# Patient Record
Sex: Female | Born: 1935 | Race: White | Hispanic: No | Smoking: Never smoker
Health system: Southern US, Community
[De-identification: ages and names within clinical notes are randomized; demographics above are authoritative.]

## PROBLEM LIST (undated history)

## (undated) DIAGNOSIS — M4800 Spinal stenosis, site unspecified: Secondary | ICD-10-CM

## (undated) DIAGNOSIS — F0390 Unspecified dementia without behavioral disturbance: Secondary | ICD-10-CM

## (undated) DIAGNOSIS — D509 Iron deficiency anemia, unspecified: Secondary | ICD-10-CM

## (undated) DIAGNOSIS — E785 Hyperlipidemia, unspecified: Secondary | ICD-10-CM

## (undated) DIAGNOSIS — K219 Gastro-esophageal reflux disease without esophagitis: Secondary | ICD-10-CM

## (undated) DIAGNOSIS — Z8619 Personal history of other infectious and parasitic diseases: Secondary | ICD-10-CM

## (undated) DIAGNOSIS — J301 Allergic rhinitis due to pollen: Secondary | ICD-10-CM

## (undated) DIAGNOSIS — K449 Diaphragmatic hernia without obstruction or gangrene: Secondary | ICD-10-CM

## (undated) DIAGNOSIS — I499 Cardiac arrhythmia, unspecified: Secondary | ICD-10-CM

## (undated) DIAGNOSIS — M1990 Unspecified osteoarthritis, unspecified site: Secondary | ICD-10-CM

## (undated) DIAGNOSIS — I10 Essential (primary) hypertension: Secondary | ICD-10-CM

## (undated) DIAGNOSIS — R5383 Other fatigue: Secondary | ICD-10-CM

## (undated) DIAGNOSIS — M109 Gout, unspecified: Secondary | ICD-10-CM

## (undated) DIAGNOSIS — J3081 Allergic rhinitis due to animal (cat) (dog) hair and dander: Secondary | ICD-10-CM

## (undated) DIAGNOSIS — C801 Malignant (primary) neoplasm, unspecified: Secondary | ICD-10-CM

## (undated) DIAGNOSIS — N189 Chronic kidney disease, unspecified: Secondary | ICD-10-CM

## (undated) DIAGNOSIS — I509 Heart failure, unspecified: Secondary | ICD-10-CM

## (undated) DIAGNOSIS — I4891 Unspecified atrial fibrillation: Secondary | ICD-10-CM

## (undated) HISTORY — DX: Heart failure, unspecified: I50.9

## (undated) HISTORY — DX: Unspecified osteoarthritis, unspecified site: M19.90

## (undated) HISTORY — DX: Spinal stenosis, site unspecified: M48.00

## (undated) HISTORY — PX: TONSILLECTOMY: SUR1361

## (undated) HISTORY — PX: NOSE SURGERY: SHX723

## (undated) HISTORY — PX: HERNIA REPAIR: SHX51

## (undated) HISTORY — DX: Personal history of other infectious and parasitic diseases: Z86.19

## (undated) HISTORY — PX: CATARACT EXTRACTION: SUR2

## (undated) HISTORY — PX: OTHER SURGICAL HISTORY: SHX169

## (undated) HISTORY — DX: Malignant (primary) neoplasm, unspecified: C80.1

## (undated) HISTORY — DX: Diaphragmatic hernia without obstruction or gangrene: K44.9

## (undated) HISTORY — DX: Iron deficiency anemia, unspecified: D50.9

## (undated) HISTORY — DX: Allergic rhinitis due to pollen: J30.1

## (undated) HISTORY — DX: Gastro-esophageal reflux disease without esophagitis: K21.9

## (undated) HISTORY — DX: Allergic rhinitis due to animal (cat) (dog) hair and dander: J30.81

## (undated) HISTORY — PX: ROTATOR CUFF REPAIR: SHX139

## (undated) HISTORY — DX: Cardiac arrhythmia, unspecified: I49.9

## (undated) HISTORY — PX: TOTAL ABDOMINAL HYSTERECTOMY: SHX209

## (undated) HISTORY — DX: Gout, unspecified: M10.9

## (undated) HISTORY — DX: Hyperlipidemia, unspecified: E78.5

## (undated) HISTORY — PX: BACK SURGERY: SHX140

## (undated) HISTORY — PX: CHOLECYSTECTOMY: SHX55

## (undated) HISTORY — PX: SHOULDER SURGERY: SHX246

## (undated) HISTORY — DX: Essential (primary) hypertension: I10

## (undated) HISTORY — DX: Chronic kidney disease, unspecified: N18.9

## (undated) HISTORY — PX: MELANOMA EXCISION: SHX5266

## (undated) HISTORY — DX: Other fatigue: R53.83

## (undated) HISTORY — PX: APPENDECTOMY: SHX54

---

## 2003-04-05 ENCOUNTER — Other Ambulatory Visit: Payer: Self-pay

## 2004-07-05 ENCOUNTER — Inpatient Hospital Stay: Payer: Self-pay | Admitting: Endocrinology

## 2004-07-07 ENCOUNTER — Other Ambulatory Visit: Payer: Self-pay

## 2004-08-31 ENCOUNTER — Ambulatory Visit: Payer: Self-pay | Admitting: Unknown Physician Specialty

## 2004-09-29 ENCOUNTER — Ambulatory Visit: Payer: Self-pay | Admitting: Unknown Physician Specialty

## 2005-05-14 ENCOUNTER — Ambulatory Visit: Payer: Self-pay | Admitting: Ophthalmology

## 2005-05-21 ENCOUNTER — Ambulatory Visit: Payer: Self-pay | Admitting: Ophthalmology

## 2005-09-07 ENCOUNTER — Encounter: Payer: Self-pay | Admitting: Unknown Physician Specialty

## 2005-09-17 ENCOUNTER — Ambulatory Visit: Payer: Self-pay | Admitting: Unknown Physician Specialty

## 2005-09-26 ENCOUNTER — Encounter: Payer: Self-pay | Admitting: Unknown Physician Specialty

## 2005-11-26 ENCOUNTER — Ambulatory Visit: Payer: Self-pay | Admitting: Infectious Diseases

## 2006-05-09 ENCOUNTER — Ambulatory Visit: Payer: Self-pay | Admitting: Otolaryngology

## 2006-07-10 ENCOUNTER — Ambulatory Visit: Payer: Self-pay | Admitting: Unknown Physician Specialty

## 2006-07-10 ENCOUNTER — Other Ambulatory Visit: Payer: Self-pay

## 2006-07-23 ENCOUNTER — Inpatient Hospital Stay: Payer: Self-pay | Admitting: Unknown Physician Specialty

## 2006-09-10 ENCOUNTER — Encounter: Payer: Self-pay | Admitting: General Practice

## 2006-09-27 ENCOUNTER — Encounter: Payer: Self-pay | Admitting: General Practice

## 2006-10-28 ENCOUNTER — Encounter: Payer: Self-pay | Admitting: General Practice

## 2006-11-27 ENCOUNTER — Encounter: Payer: Self-pay | Admitting: General Practice

## 2007-02-05 ENCOUNTER — Ambulatory Visit: Payer: Self-pay | Admitting: Internal Medicine

## 2007-02-14 ENCOUNTER — Ambulatory Visit: Payer: Self-pay | Admitting: Internal Medicine

## 2007-02-14 ENCOUNTER — Other Ambulatory Visit: Payer: Self-pay

## 2007-04-08 ENCOUNTER — Ambulatory Visit: Payer: Self-pay | Admitting: Internal Medicine

## 2007-04-08 ENCOUNTER — Encounter: Payer: Self-pay | Admitting: Specialist

## 2007-04-27 ENCOUNTER — Encounter: Payer: Self-pay | Admitting: Specialist

## 2007-05-28 ENCOUNTER — Encounter: Payer: Self-pay | Admitting: Specialist

## 2007-06-27 ENCOUNTER — Encounter: Payer: Self-pay | Admitting: Specialist

## 2007-07-28 ENCOUNTER — Encounter: Payer: Self-pay | Admitting: Specialist

## 2007-08-27 ENCOUNTER — Encounter: Payer: Self-pay | Admitting: Specialist

## 2007-09-27 ENCOUNTER — Encounter: Payer: Self-pay | Admitting: Specialist

## 2007-10-28 ENCOUNTER — Encounter: Payer: Self-pay | Admitting: Specialist

## 2007-11-27 ENCOUNTER — Encounter: Payer: Self-pay | Admitting: Specialist

## 2007-12-28 ENCOUNTER — Encounter: Payer: Self-pay | Admitting: Specialist

## 2008-01-13 ENCOUNTER — Ambulatory Visit: Payer: Self-pay | Admitting: Internal Medicine

## 2008-01-27 ENCOUNTER — Encounter: Payer: Self-pay | Admitting: Specialist

## 2008-04-01 ENCOUNTER — Ambulatory Visit: Payer: Self-pay | Admitting: Internal Medicine

## 2008-11-10 ENCOUNTER — Ambulatory Visit: Payer: Self-pay | Admitting: Internal Medicine

## 2008-11-22 ENCOUNTER — Ambulatory Visit: Payer: Self-pay | Admitting: Unknown Physician Specialty

## 2009-01-04 ENCOUNTER — Ambulatory Visit: Payer: Self-pay | Admitting: Anesthesiology

## 2009-01-12 ENCOUNTER — Ambulatory Visit: Payer: Self-pay | Admitting: Anesthesiology

## 2009-02-02 ENCOUNTER — Ambulatory Visit: Payer: Self-pay | Admitting: Anesthesiology

## 2009-02-07 ENCOUNTER — Ambulatory Visit: Payer: Self-pay | Admitting: Internal Medicine

## 2009-02-11 ENCOUNTER — Ambulatory Visit: Payer: Self-pay | Admitting: Internal Medicine

## 2009-02-15 ENCOUNTER — Ambulatory Visit: Payer: Self-pay | Admitting: Internal Medicine

## 2009-02-22 ENCOUNTER — Ambulatory Visit: Payer: Self-pay | Admitting: Internal Medicine

## 2009-02-28 ENCOUNTER — Ambulatory Visit: Payer: Self-pay | Admitting: Internal Medicine

## 2009-03-04 ENCOUNTER — Ambulatory Visit: Payer: Self-pay | Admitting: Internal Medicine

## 2009-03-10 ENCOUNTER — Ambulatory Visit: Payer: Self-pay | Admitting: Internal Medicine

## 2009-03-14 ENCOUNTER — Ambulatory Visit: Payer: Self-pay | Admitting: Anesthesiology

## 2009-03-17 ENCOUNTER — Ambulatory Visit: Payer: Self-pay | Admitting: Internal Medicine

## 2009-03-31 ENCOUNTER — Ambulatory Visit: Payer: Self-pay | Admitting: Internal Medicine

## 2009-04-07 ENCOUNTER — Ambulatory Visit: Payer: Self-pay | Admitting: Anesthesiology

## 2009-04-19 ENCOUNTER — Ambulatory Visit: Payer: Self-pay | Admitting: Internal Medicine

## 2009-05-02 ENCOUNTER — Ambulatory Visit: Payer: Self-pay | Admitting: Cardiovascular Disease

## 2009-05-02 DIAGNOSIS — R079 Chest pain, unspecified: Secondary | ICD-10-CM

## 2009-05-02 DIAGNOSIS — E785 Hyperlipidemia, unspecified: Secondary | ICD-10-CM

## 2009-05-02 DIAGNOSIS — I10 Essential (primary) hypertension: Secondary | ICD-10-CM | POA: Insufficient documentation

## 2009-05-02 DIAGNOSIS — R0602 Shortness of breath: Secondary | ICD-10-CM | POA: Insufficient documentation

## 2009-05-12 ENCOUNTER — Encounter: Payer: Self-pay | Admitting: Anesthesiology

## 2009-05-26 ENCOUNTER — Ambulatory Visit: Payer: Self-pay

## 2009-05-26 ENCOUNTER — Encounter: Payer: Self-pay | Admitting: Cardiovascular Disease

## 2009-05-26 DIAGNOSIS — R011 Cardiac murmur, unspecified: Secondary | ICD-10-CM | POA: Insufficient documentation

## 2009-05-27 ENCOUNTER — Encounter: Payer: Self-pay | Admitting: Anesthesiology

## 2009-06-02 ENCOUNTER — Ambulatory Visit: Payer: Self-pay | Admitting: Anesthesiology

## 2009-06-24 ENCOUNTER — Ambulatory Visit: Payer: Self-pay | Admitting: Internal Medicine

## 2009-07-06 ENCOUNTER — Ambulatory Visit: Payer: Self-pay | Admitting: Unknown Physician Specialty

## 2009-07-14 ENCOUNTER — Ambulatory Visit: Payer: Self-pay | Admitting: Anesthesiology

## 2009-08-05 ENCOUNTER — Ambulatory Visit: Payer: Self-pay | Admitting: Internal Medicine

## 2009-08-09 ENCOUNTER — Ambulatory Visit: Payer: Self-pay | Admitting: Anesthesiology

## 2009-09-05 ENCOUNTER — Ambulatory Visit: Payer: Self-pay | Admitting: Anesthesiology

## 2009-09-14 ENCOUNTER — Ambulatory Visit: Payer: Self-pay | Admitting: Anesthesiology

## 2009-10-18 ENCOUNTER — Ambulatory Visit: Payer: Self-pay | Admitting: Anesthesiology

## 2009-11-16 ENCOUNTER — Ambulatory Visit: Payer: Self-pay | Admitting: Anesthesiology

## 2009-11-29 ENCOUNTER — Encounter: Payer: Self-pay | Admitting: Cardiovascular Disease

## 2009-12-06 ENCOUNTER — Ambulatory Visit: Payer: Self-pay | Admitting: Anesthesiology

## 2009-12-07 ENCOUNTER — Ambulatory Visit: Payer: Self-pay | Admitting: Surgery

## 2009-12-08 ENCOUNTER — Encounter: Payer: Self-pay | Admitting: Cardiovascular Disease

## 2009-12-08 ENCOUNTER — Telehealth (INDEPENDENT_AMBULATORY_CARE_PROVIDER_SITE_OTHER): Payer: Self-pay | Admitting: *Deleted

## 2009-12-15 ENCOUNTER — Ambulatory Visit: Payer: Self-pay | Admitting: Surgery

## 2010-02-15 ENCOUNTER — Ambulatory Visit: Payer: Self-pay | Admitting: Anesthesiology

## 2010-03-28 NOTE — Consult Note (Signed)
Summary: [facility] Consult   [facility] Consult   Imported By: Sallee Provencal 12/12/2009 14:59:55  _____________________________________________________________________  External Attachment:    Type:   Image     Comment:   External Document

## 2010-03-28 NOTE — Letter (Signed)
Summary: Medical Record Release  Medical Record Release   Imported By: Zenovia Jarred 12/13/2009 09:45:56  _____________________________________________________________________  External Attachment:    Type:   Image     Comment:   External Document

## 2010-03-28 NOTE — Progress Notes (Signed)
Summary: PHI  PHI   Imported By: Zenovia Jarred 05/03/2009 11:36:29  _____________________________________________________________________  External Attachment:    Type:   Image     Comment:   External Document

## 2010-03-28 NOTE — Assessment & Plan Note (Signed)
Summary: np6   Visit Type:  New Patient Referring Provider:  Dawson Bills, PA Primary Provider:  Deborra Medina, MD  CC:  chest discomfort; sometimes hard for her to get her breath; no edema recently in ankles and feet. Marland Kitchen  History of Present Illness: very pleasant 77 year old woman with a history of chronic shortness of breath, GERD, who I know who her husband who used to be a patient of mine at [facility] who presents for evaluation of shortness of breath.  Ms. Arcand states that overall she thinks that she has been doing okay though states that after her husband died, her world seem to drastically change. She has not put any meals in the kitchen, does not go out very much, does not go exercising with her girlfriends. She has been started on an antidepressant medication though she is uncertain if this has helped. She has had significant supports from her family who keep her company.  She states that she has had some shortness of breath. This comes on at rest and states that she feels it when she takes a big breath in as she is unable to exhale all the way. The same mild feeling occurs when she does a little bit of walking. She has not noticed that it increases significantly with exertion. No significant chest tightness. No lightheadedness, diaphoresis, throat tightness or arm discomfort. No significant coughing noted.  She also states having worsening cramping in her legs as well as her hands and arms. This is been going on for some time that has been getting severe to the point where she has to limit what she does otherwise the cramping might come on and be severe.  Her GERD has also been troubling her and she is recently seen Dr. Vira Agar and their team for possible workup as well as colonoscopy. She states that her colonoscopy had been scheduled this Friday and had been delayed due to her saying that sometimes she has shortness of breath.  Preventive  Screening-Counseling & Management  Alcohol-Tobacco     Alcohol drinks/day: 0     Smoking Status: never  Caffeine-Diet-Exercise     Caffeine use/day: 1-2 cup     Does Patient Exercise: no     Type of exercise: yard work      Drug Use:  no.    Current Problems (verified): 1)  Chest Pain Unspecified  (ICD-786.50) 2)  Shortness of Breath  (ICD-786.05) 3)  Hyperlipidemia  (ICD-272.4) 4)  Essential Hypertension, Benign  (ICD-401.1)  Current Medications (verified): 1)  Caltrate 600+d 600-400 Mg-Unit Tabs (Calcium Carbonate-Vitamin D) .Marland Kitchen.. 1 By Mouth Once Daily 2)  Cetirizine Hcl 10 Mg Chew (Cetirizine Hcl) .Marland Kitchen.. 1 By Mouth Once Daily 3)  Crestor 10 Mg Tabs (Rosuvastatin Calcium) .... Take One Tablet By Mouth Daily. 4)  Lexapro 10 Mg Tabs (Escitalopram Oxalate) .Marland Kitchen.. 1 By Mouth Once Daily 5)  Lisinopril-Hydrochlorothiazide 10-12.5 Mg Tabs (Lisinopril-Hydrochlorothiazide) .Marland Kitchen.. 1 By Mouth Once Daily 6)  Protonix 40 Mg Tbec (Pantoprazole Sodium) .Marland Kitchen.. 1 By Mouth Two Times A Day 7)  Proair Hfa 108 (90 Base) Mcg/act Aers (Albuterol Sulfate) .... As Directed 8)  Singulair 10 Mg Tabs (Montelukast Sodium) .Marland Kitchen.. 1 By Mouth Once Daily 9)  Symbicort 160-4.5 Mcg/act Aero (Budesonide-Formoterol Fumarate) .... As Directed 10)  Tramadol Hcl 50 Mg Tabs (Tramadol Hcl) .Marland Kitchen.. 1 Every Four To Six Hours As Needed For Pain 11)  Vitamin B-12 500 Mcg Tabs (Cyanocobalamin) .Marland Kitchen.. 1 By Mouth Two Times A Day  12)  Vitamin D 1000 Unit Tabs (Cholecalciferol) .Marland Kitchen.. 1 By Mouth Once Daily  Allergies (verified): 1)  ! Codeine 2)  ! Demerol 3)  ! Vicodin 4)  ! Darvocet 5)  ! Iodine 6)  ! * Tape 7)  ! * Seafood  Past History:  Past Medical History: Hyperlipidemia Hypertension Allergies Hay Fever as child GERD Arthritis Fatique  Past Surgical History: Cataract Extraction Tonsillectomy Appendectomy Abdominal Hysterectomy-Total Knee replacement - both Gall bladder Nose surgery Shoulder surgery Feet  surgery Back surgery  Family History: Father: deceased 30: liver cancer; CHF Mother: deceased 45; cancer ovarian Sister: deceased: COPD, Heart problems,  Sister: deceased :lung cancer Brother: deceased: lung and prostate cancer 2 niece's ovarian and breast cancer  Social History: Retired  Widowed  Tobacco Use - No.  Alcohol Use - no Regular Exercise - no Drug Use - no Alcohol drinks/day:  0 Smoking Status:  never Caffeine use/day:  1-2 cup Does Patient Exercise:  no Drug Use:  no  Review of Systems       The patient complains of dyspnea on exertion.  The patient denies fever, weight gain, vision loss, decreased hearing, hoarseness, chest pain, syncope, peripheral edema, prolonged cough, headaches, abdominal pain, incontinence, muscle weakness, difficulty walking, depression, unusual weight change, and enlarged lymph nodes.    Vital Signs:  Patient profile:   77 year old female Height:      62 inches Weight:      129.50 pounds BMI:     23.77 Pulse rate:   66 / minute Pulse rhythm:   regular BP sitting:   132 / 58  (left arm) Cuff size:   regular  Vitals Entered By: Philemon Kingdom (May 02, 2009 3:13 PM)  Physical Exam  General:  well appearing, thin elderly woman in no apparent distress. Her HEENT exam is benign, neck is supple with no JVP or carotid bruits, heart sounds are regular with normal S1 and S2 and 2/6 systolic ejection murmur heard at the right sternal border, lungs are clear to auscultation with no wheezes rales, abdominal exam is benign, no significant lower extremity edema, neurologic exam is grossly nonfocal, skin is warm and dry, pulses are equal and symmetrical in her upper and lower extremities.    EKG  Procedure date:  05/02/2009  Findings:      EKG shows normal sinus rhythm with rate of 66 beats per minute, rare APC, no significant ST or T wave changes noted.  Impression & Recommendations:  Problem # 1:  SHORTNESS OF BREATH  (ICD-786.05) etiology of her chest pain is uncertain though has some atypical features. She has it at rest, sitting on the examining table as well as with exertion. She does have a prominent murmur on exam we have suggested that we order an echocardiogram to evaluate her valves, cardiac function, right ventricular systolic pressures as a way to rule out pulmonary hypertension.We will call her with the results and forward these to Dr. Derrel Nip  as soon as the it  becomes available.   I believe that she would be  low risk for her colonoscopy and can be scheduled for the procedure.  Her updated medication list for this problem includes:    Lisinopril-hydrochlorothiazide 10-12.5 Mg Tabs (Lisinopril-hydrochlorothiazide) .Marland Kitchen... 1 by mouth once daily  Problem # 2:  HYPERLIPIDEMIA (ICD-272.4) we'll try to obtain a copy of her most recent cholesterol panel for our records. Her updated medication list for this problem includes:    Crestor 10 Mg  Tabs (Rosuvastatin calcium) .Marland Kitchen... Take one tablet by mouth daily.  Problem # 3:  ESSENTIAL HYPERTENSION, BENIGN (ICD-401.1) Blood pressure is well controlled on today's visit.  Her updated medication list for this problem includes:    Lisinopril-hydrochlorothiazide 10-12.5 Mg Tabs (Lisinopril-hydrochlorothiazide) .Marland Kitchen... 1 by mouth once daily  Problem # 4:  [place] (ICD-V70.0) We have suggested that she restart aspirin coated 81 mg daily for heart attack and stroke prevention. I assisted that she increase her exercise. This may also help with her depression as she does seem to be doing less since her husband died.  Appended Document: Orders Update    Clinical Lists Changes  Problems: Added new problem of MURMUR (ICD-785.2) Orders: Added new Referral order of Echocardiogram (Echo) - Signed

## 2010-03-28 NOTE — Progress Notes (Signed)
Summary: POA  Phone Note Call from Patient Call back at Home Phone [phone]   Caller: SELF Call For: [facility] Summary of Call: PT IS CONFIRMING THAT DAUGHTER, KATHY BEAL, IS POA AND [place] FAXED TO HER TO SIGN FOR RECORDS Initial call taken by: Zenovia Jarred,  December 08, 2009 10:57 AM

## 2010-04-01 ENCOUNTER — Emergency Department: Payer: Self-pay | Admitting: Unknown Physician Specialty

## 2010-04-05 ENCOUNTER — Ambulatory Visit: Payer: Self-pay | Admitting: Anesthesiology

## 2010-04-30 ENCOUNTER — Inpatient Hospital Stay: Payer: Self-pay | Admitting: Family Medicine

## 2010-05-07 ENCOUNTER — Emergency Department: Payer: Self-pay | Admitting: Emergency Medicine

## 2010-06-20 ENCOUNTER — Ambulatory Visit: Payer: Self-pay | Admitting: Family Medicine

## 2010-06-22 ENCOUNTER — Ambulatory Visit: Payer: Self-pay | Admitting: Anesthesiology

## 2010-07-05 ENCOUNTER — Encounter: Payer: Self-pay | Admitting: Nurse Practitioner

## 2010-07-28 ENCOUNTER — Encounter: Payer: Self-pay | Admitting: Nurse Practitioner

## 2010-08-02 ENCOUNTER — Other Ambulatory Visit: Payer: Self-pay | Admitting: Pain Medicine

## 2010-08-02 ENCOUNTER — Ambulatory Visit: Payer: Self-pay | Admitting: Pain Medicine

## 2010-08-03 ENCOUNTER — Encounter: Payer: Self-pay | Admitting: Nurse Practitioner

## 2010-08-21 ENCOUNTER — Ambulatory Visit: Payer: Self-pay | Admitting: Pain Medicine

## 2010-08-27 ENCOUNTER — Encounter: Payer: Self-pay | Admitting: Nurse Practitioner

## 2010-09-13 ENCOUNTER — Encounter: Payer: Self-pay | Admitting: Cardiovascular Disease

## 2010-09-27 ENCOUNTER — Encounter: Payer: Self-pay | Admitting: Nurse Practitioner

## 2010-09-27 ENCOUNTER — Encounter: Payer: Self-pay | Admitting: Cardiothoracic Surgery

## 2010-10-17 ENCOUNTER — Ambulatory Visit: Payer: Self-pay | Admitting: Pain Medicine

## 2010-11-06 ENCOUNTER — Ambulatory Visit: Payer: Self-pay | Admitting: Pain Medicine

## 2010-11-09 ENCOUNTER — Ambulatory Visit: Payer: Self-pay | Admitting: Pain Medicine

## 2010-11-09 DIAGNOSIS — I498 Other specified cardiac arrhythmias: Secondary | ICD-10-CM

## 2011-02-05 ENCOUNTER — Ambulatory Visit: Payer: Self-pay | Admitting: Pain Medicine

## 2011-02-08 ENCOUNTER — Ambulatory Visit: Payer: Self-pay | Admitting: Pain Medicine

## 2011-03-14 ENCOUNTER — Ambulatory Visit: Payer: Self-pay | Admitting: Pain Medicine

## 2011-03-15 ENCOUNTER — Ambulatory Visit: Payer: Self-pay | Admitting: Pain Medicine

## 2011-03-28 ENCOUNTER — Ambulatory Visit: Payer: Self-pay | Admitting: Pain Medicine

## 2011-06-14 ENCOUNTER — Ambulatory Visit: Payer: Self-pay | Admitting: Pain Medicine

## 2011-07-11 ENCOUNTER — Ambulatory Visit: Payer: Self-pay | Admitting: Pain Medicine

## 2011-07-12 ENCOUNTER — Ambulatory Visit: Payer: Self-pay | Admitting: Pain Medicine

## 2011-08-17 ENCOUNTER — Encounter: Payer: Self-pay | Admitting: Nurse Practitioner

## 2011-08-17 ENCOUNTER — Encounter: Payer: Self-pay | Admitting: Cardiothoracic Surgery

## 2011-08-27 ENCOUNTER — Encounter: Payer: Self-pay | Admitting: Cardiothoracic Surgery

## 2011-08-27 ENCOUNTER — Encounter: Payer: Self-pay | Admitting: Nurse Practitioner

## 2011-09-27 ENCOUNTER — Encounter: Payer: Self-pay | Admitting: Nurse Practitioner

## 2011-10-01 ENCOUNTER — Encounter: Payer: Self-pay | Admitting: Cardiothoracic Surgery

## 2011-10-01 ENCOUNTER — Ambulatory Visit: Payer: Self-pay | Admitting: Pain Medicine

## 2011-10-02 ENCOUNTER — Ambulatory Visit: Payer: Self-pay | Admitting: Pain Medicine

## 2011-10-10 ENCOUNTER — Encounter: Payer: Self-pay | Admitting: Family Medicine

## 2011-10-16 ENCOUNTER — Ambulatory Visit: Payer: Self-pay | Admitting: Otolaryngology

## 2011-10-16 LAB — CREATININE, SERUM
EGFR (African American): 39 — ABNORMAL LOW
EGFR (Non-African Amer.): 33 — ABNORMAL LOW

## 2011-10-24 ENCOUNTER — Ambulatory Visit: Payer: Self-pay | Admitting: Pain Medicine

## 2011-10-25 ENCOUNTER — Ambulatory Visit: Payer: Self-pay | Admitting: Pain Medicine

## 2011-10-28 ENCOUNTER — Encounter: Payer: Self-pay | Admitting: Family Medicine

## 2011-11-12 ENCOUNTER — Ambulatory Visit: Payer: Self-pay | Admitting: Pain Medicine

## 2012-01-09 ENCOUNTER — Ambulatory Visit: Payer: Self-pay | Admitting: Pain Medicine

## 2012-01-29 ENCOUNTER — Ambulatory Visit: Payer: Self-pay | Admitting: Pain Medicine

## 2012-02-13 ENCOUNTER — Ambulatory Visit: Payer: Self-pay | Admitting: Pain Medicine

## 2012-03-07 ENCOUNTER — Inpatient Hospital Stay: Payer: Self-pay | Admitting: Family Medicine

## 2012-03-07 LAB — CBC
HGB: 8.2 g/dL — ABNORMAL LOW (ref 12.0–16.0)
MCHC: 31.6 g/dL — ABNORMAL LOW (ref 32.0–36.0)
MCV: 91 fL (ref 80–100)
Platelet: 241 10*3/uL (ref 150–440)
RBC: 2.84 10*6/uL — ABNORMAL LOW (ref 3.80–5.20)
RDW: 14.2 % (ref 11.5–14.5)
WBC: 22 10*3/uL — ABNORMAL HIGH (ref 3.6–11.0)

## 2012-03-07 LAB — COMPREHENSIVE METABOLIC PANEL
Albumin: 2.6 g/dL — ABNORMAL LOW (ref 3.4–5.0)
BUN: 29 mg/dL — ABNORMAL HIGH (ref 7–18)
Chloride: 105 mmol/L (ref 98–107)
Creatinine: 1.88 mg/dL — ABNORMAL HIGH (ref 0.60–1.30)
EGFR (Non-African Amer.): 24 — ABNORMAL LOW
SGOT(AST): 20 U/L (ref 15–37)
SGPT (ALT): 14 U/L (ref 12–78)
Sodium: 137 mmol/L (ref 136–145)
Total Protein: 6.1 g/dL — ABNORMAL LOW (ref 6.4–8.2)

## 2012-03-07 LAB — URINALYSIS, COMPLETE
Ketone: NEGATIVE
Nitrite: NEGATIVE
Protein: NEGATIVE
RBC,UR: 1 /HPF (ref 0–5)

## 2012-03-07 LAB — MAGNESIUM: Magnesium: 1.1 mg/dL — ABNORMAL LOW

## 2012-03-08 LAB — CBC WITH DIFFERENTIAL/PLATELET
Eosinophil %: 0 %
Lymphocyte #: 0.4 10*3/uL — ABNORMAL LOW (ref 1.0–3.6)
MCH: 29.7 pg (ref 26.0–34.0)
MCV: 90 fL (ref 80–100)
Neutrophil #: 18.7 10*3/uL — ABNORMAL HIGH (ref 1.4–6.5)
Neutrophil %: 96.6 %
Platelet: 262 10*3/uL (ref 150–440)
RBC: 3.2 10*6/uL — ABNORMAL LOW (ref 3.80–5.20)
RDW: 14.2 % (ref 11.5–14.5)

## 2012-03-08 LAB — BASIC METABOLIC PANEL
Anion Gap: 9 (ref 7–16)
BUN: 30 mg/dL — ABNORMAL HIGH (ref 7–18)
Calcium, Total: 8 mg/dL — ABNORMAL LOW (ref 8.5–10.1)
EGFR (Non-African Amer.): 27 — ABNORMAL LOW
Potassium: 4 mmol/L (ref 3.5–5.1)
Sodium: 137 mmol/L (ref 136–145)

## 2012-03-09 LAB — CBC WITH DIFFERENTIAL/PLATELET
Basophil %: 0.1 %
Eosinophil #: 0 10*3/uL (ref 0.0–0.7)
Eosinophil %: 0 %
HCT: 26.5 % — ABNORMAL LOW (ref 35.0–47.0)
Lymphocyte %: 2.2 %
MCH: 31 pg (ref 26.0–34.0)
MCHC: 34.7 g/dL (ref 32.0–36.0)
MCV: 90 fL (ref 80–100)
Monocyte #: 0.6 x10 3/mm (ref 0.2–0.9)
Monocyte %: 3.1 %
Neutrophil #: 17.2 10*3/uL — ABNORMAL HIGH (ref 1.4–6.5)
Platelet: 277 10*3/uL (ref 150–440)

## 2012-03-09 LAB — BASIC METABOLIC PANEL
BUN: 33 mg/dL — ABNORMAL HIGH (ref 7–18)
Calcium, Total: 8 mg/dL — ABNORMAL LOW (ref 8.5–10.1)
Chloride: 108 mmol/L — ABNORMAL HIGH (ref 98–107)
Co2: 20 mmol/L — ABNORMAL LOW (ref 21–32)
EGFR (Non-African Amer.): 27 — ABNORMAL LOW
Glucose: 143 mg/dL — ABNORMAL HIGH (ref 65–99)
Osmolality: 285 (ref 275–301)
Potassium: 3.7 mmol/L (ref 3.5–5.1)
Sodium: 138 mmol/L (ref 136–145)

## 2012-03-10 LAB — CBC WITH DIFFERENTIAL/PLATELET
Basophil #: 0 10*3/uL (ref 0.0–0.1)
Eosinophil #: 0 10*3/uL (ref 0.0–0.7)
Eosinophil %: 0.2 %
HCT: 26.5 % — ABNORMAL LOW (ref 35.0–47.0)
HGB: 9 g/dL — ABNORMAL LOW (ref 12.0–16.0)
Lymphocyte #: 1 10*3/uL (ref 1.0–3.6)
Lymphocyte %: 8.3 %
MCHC: 33.8 g/dL (ref 32.0–36.0)
Monocyte #: 0.9 x10 3/mm (ref 0.2–0.9)
Monocyte %: 7.2 %
Neutrophil #: 10.3 10*3/uL — ABNORMAL HIGH (ref 1.4–6.5)
Neutrophil %: 84.1 %
Platelet: 298 10*3/uL (ref 150–440)

## 2012-03-10 LAB — BASIC METABOLIC PANEL
Anion Gap: 9 (ref 7–16)
BUN: 29 mg/dL — ABNORMAL HIGH (ref 7–18)
Chloride: 114 mmol/L — ABNORMAL HIGH (ref 98–107)
Creatinine: 1.37 mg/dL — ABNORMAL HIGH (ref 0.60–1.30)
EGFR (Non-African Amer.): 35 — ABNORMAL LOW
Glucose: 96 mg/dL (ref 65–99)

## 2012-03-11 LAB — BASIC METABOLIC PANEL
BUN: 18 mg/dL (ref 7–18)
Calcium, Total: 8.6 mg/dL (ref 8.5–10.1)
Chloride: 110 mmol/L — ABNORMAL HIGH (ref 98–107)
Co2: 23 mmol/L (ref 21–32)
Creatinine: 1.32 mg/dL — ABNORMAL HIGH (ref 0.60–1.30)
EGFR (African American): 43 — ABNORMAL LOW
EGFR (Non-African Amer.): 37 — ABNORMAL LOW
Glucose: 79 mg/dL (ref 65–99)
Osmolality: 282 (ref 275–301)
Sodium: 141 mmol/L (ref 136–145)

## 2012-03-11 LAB — CBC WITH DIFFERENTIAL/PLATELET
Basophil %: 0.4 %
Eosinophil #: 0.2 10*3/uL (ref 0.0–0.7)
Eosinophil %: 1.3 %
HCT: 28.5 % — ABNORMAL LOW (ref 35.0–47.0)
HGB: 10.4 g/dL — ABNORMAL LOW (ref 12.0–16.0)
Lymphocyte #: 1.3 10*3/uL (ref 1.0–3.6)
Lymphocyte %: 11.2 %
Neutrophil #: 9.3 10*3/uL — ABNORMAL HIGH (ref 1.4–6.5)
Neutrophil %: 78.4 %
Platelet: 317 10*3/uL (ref 150–440)
RBC: 3.16 10*6/uL — ABNORMAL LOW (ref 3.80–5.20)
RDW: 14.6 % — ABNORMAL HIGH (ref 11.5–14.5)
WBC: 11.8 10*3/uL — ABNORMAL HIGH (ref 3.6–11.0)

## 2012-03-12 LAB — CBC WITH DIFFERENTIAL/PLATELET
Basophil %: 0.6 %
Eosinophil #: 0.2 10*3/uL (ref 0.0–0.7)
Eosinophil %: 1.6 %
HCT: 26.7 % — ABNORMAL LOW (ref 35.0–47.0)
HGB: 9.4 g/dL — ABNORMAL LOW (ref 12.0–16.0)
Lymphocyte #: 1.1 10*3/uL (ref 1.0–3.6)
Lymphocyte %: 10.5 %
MCH: 31.4 pg (ref 26.0–34.0)
MCV: 89 fL (ref 80–100)
Monocyte %: 7.3 %
Neutrophil #: 8.7 10*3/uL — ABNORMAL HIGH (ref 1.4–6.5)
Neutrophil %: 80 %
Platelet: 333 10*3/uL (ref 150–440)

## 2012-03-12 LAB — BASIC METABOLIC PANEL
Anion Gap: 9 (ref 7–16)
Chloride: 111 mmol/L — ABNORMAL HIGH (ref 98–107)
Creatinine: 1.17 mg/dL (ref 0.60–1.30)
EGFR (African American): 49 — ABNORMAL LOW
EGFR (Non-African Amer.): 42 — ABNORMAL LOW
Potassium: 3.8 mmol/L (ref 3.5–5.1)

## 2012-03-13 LAB — CULTURE, BLOOD (SINGLE)

## 2012-05-26 ENCOUNTER — Ambulatory Visit: Payer: Self-pay | Admitting: Pain Medicine

## 2012-06-03 ENCOUNTER — Ambulatory Visit: Payer: Self-pay | Admitting: Pain Medicine

## 2012-06-18 ENCOUNTER — Ambulatory Visit: Payer: Self-pay | Admitting: Pain Medicine

## 2012-09-05 ENCOUNTER — Ambulatory Visit: Payer: Self-pay | Admitting: Nephrology

## 2012-11-05 ENCOUNTER — Other Ambulatory Visit: Payer: Self-pay | Admitting: Cardiovascular Disease

## 2012-11-05 ENCOUNTER — Ambulatory Visit: Payer: Self-pay | Admitting: Pain Medicine

## 2012-11-05 LAB — BASIC METABOLIC PANEL
BUN: 18 mg/dL (ref 7–18)
Co2: 27 mmol/L (ref 21–32)
EGFR (Non-African Amer.): 31 — ABNORMAL LOW

## 2012-11-05 LAB — MAGNESIUM: Magnesium: 1.8 mg/dL

## 2012-11-20 ENCOUNTER — Ambulatory Visit: Payer: Self-pay | Admitting: Pain Medicine

## 2012-12-15 ENCOUNTER — Ambulatory Visit: Payer: Self-pay | Admitting: Pain Medicine

## 2013-01-20 ENCOUNTER — Ambulatory Visit: Payer: Self-pay | Admitting: Otolaryngology

## 2013-05-18 ENCOUNTER — Ambulatory Visit: Payer: Self-pay | Admitting: Pain Medicine

## 2013-07-22 ENCOUNTER — Ambulatory Visit (INDEPENDENT_AMBULATORY_CARE_PROVIDER_SITE_OTHER): Payer: Medicare Other | Admitting: Cardiovascular Disease

## 2013-07-22 ENCOUNTER — Encounter: Payer: Self-pay | Admitting: Cardiovascular Disease

## 2013-07-22 VITALS — BP 132/60 | HR 75 | Ht 59.0 in | Wt 135.5 lb

## 2013-07-22 DIAGNOSIS — I350 Nonrheumatic aortic (valve) stenosis: Secondary | ICD-10-CM

## 2013-07-22 DIAGNOSIS — E785 Hyperlipidemia, unspecified: Secondary | ICD-10-CM

## 2013-07-22 DIAGNOSIS — R011 Cardiac murmur, unspecified: Secondary | ICD-10-CM

## 2013-07-22 DIAGNOSIS — I359 Nonrheumatic aortic valve disorder, unspecified: Secondary | ICD-10-CM

## 2013-07-22 DIAGNOSIS — R600 Localized edema: Secondary | ICD-10-CM | POA: Insufficient documentation

## 2013-07-22 DIAGNOSIS — R609 Edema, unspecified: Secondary | ICD-10-CM

## 2013-07-22 DIAGNOSIS — N189 Chronic kidney disease, unspecified: Secondary | ICD-10-CM | POA: Insufficient documentation

## 2013-07-22 DIAGNOSIS — I10 Essential (primary) hypertension: Secondary | ICD-10-CM

## 2013-07-22 MED ORDER — CLONIDINE HCL 0.1 MG PO TABS: 0.1000 mg | ORAL_TABLET | Freq: Two times a day (BID) | ORAL | Status: DC

## 2013-07-22 NOTE — Assessment & Plan Note (Signed)
Recommend that she stay on her Lipitor

## 2013-07-22 NOTE — Progress Notes (Signed)
Patient ID: Joanne Michael, female    DOB: October 15, 1924, 80 y.o.   MRN: [id number]  HPI Comments: Joanne Michael is a pleasant 80 yo woman with mild aortic valve stenosis and 2011, GERD, chronic shortness of breath hyperlipidemia, hypertension who presents for evaluation and to establish care in the [place] office.  She reports that since December 2014 or round air, she has developed severe lower extremity edema. Swelling starts in her thighs, extends all the way to her toes. She reports having worsening renal function that started several years ago. She sees Dr. Candiss Norse. Lab work from primary care shows creatinine 2.0, GFR in the 20s she was previously on lisinopril and HCTZ, pill. This was held and she was changed to amlodipine Since she was started on amlodipine, leg edema has been worse She now has leg wraps from below the knee to her toes. She is seen vein and vascular, lymphedema boots have been ordered, she has done lower extremity veins studies which has been unrevealing. Notes indicate she was started on Lasix 20 mg daily for her lower extremity edema Laboratory from 07/08/2011 shows creatinine 2.1, GFR 22, BUN 21, potassium 3.3  Prior echocardiogram for she does 11 showing normal LV systolic function, diastolic relaxation abnormality, mild aortic valve stenosis with no significant gradient, mild MR, high normal right ventricular systolic pressures  EKG today showed normal sinus rhythm with rate 74 beats per minute, otherwise normal      Outpatient Encounter Prescriptions as of 07/22/2013  Medication Sig  . allopurinol (ZYLOPRIM) 100 MG tablet Take 100 mg by mouth 2 (two) times daily.  Marland Kitchen aspirin 81 MG tablet Take 81 mg by mouth daily.  Marland Kitchen atorvastatin (LIPITOR) 20 MG tablet Take 20 mg by mouth daily.  . Calcium Carbonate-Vitamin D (CALTRATE 600+D) 600-400 MG-UNIT per tablet Take 1 tablet by mouth daily.    . cetirizine (ZYRTEC) 10 MG chewable tablet Chew 10 mg by mouth daily.    Marland Kitchen  escitalopram (LEXAPRO) 10 MG tablet Take 10 mg by mouth daily.    . ferrous sulfate 325 (65 FE) MG tablet Take 325 mg by mouth daily with breakfast.  . furosemide (LASIX) 20 MG tablet Take 20 mg by mouth daily as needed.  . gabapentin (NEURONTIN) 600 MG tablet Take 600 mg by mouth at bedtime.  . pantoprazole (PROTONIX) 40 MG tablet Take 40 mg by mouth 2 (two) times daily.    . traMADol (ULTRAM) 50 MG tablet Take 50 mg by mouth. Every 4-6 hours prn for pain   . vitamin C (ASCORBIC ACID) 500 MG tablet Take 500 mg by mouth daily.  Marland Kitchen  amLODipine (NORVASC) 10 MG tablet Take 10 mg by mouth daily.    Review of Systems  Constitutional: Negative.   HENT: Negative.   Eyes: Negative.   Respiratory: Negative.   Cardiovascular: Positive for leg swelling.  Gastrointestinal: Negative.   Endocrine: Negative.   Musculoskeletal: Negative.   Skin: Negative.   Allergic/Immunologic: Negative.   Neurological: Negative.   Hematological: Negative.   Psychiatric/Behavioral: Negative.   All other systems reviewed and are negative.   BP 132/60  Pulse 75  Ht 4\' 11"  (1.499 m)  Wt 135 lb 8 oz (61.462 kg)  BMI 27.35 kg/m2   Physical Exam  Nursing note and vitals reviewed. Constitutional: She is oriented to person, place, and time. She appears well-developed and well-nourished.  HENT:  Head: Normocephalic.  Nose: Nose normal.  Mouth/Throat: Oropharynx is clear and moist.  Eyes: Conjunctivae are  normal. Pupils are equal, round, and reactive to light.  Neck: Normal range of motion. Neck supple. No JVD present.  Cardiovascular: Normal rate, regular rhythm, S1 normal, S2 normal and intact distal pulses.  Exam reveals no gallop and no friction rub.   Murmur heard.  Systolic murmur is present with a grade of 2/6  Significant swelling of her thighs. Legs wrapped knees down to her toes  Pulmonary/Chest: Effort normal and breath sounds normal. No respiratory distress. She has no wheezes. She has no rales. She  exhibits no tenderness.  Abdominal: Soft. Bowel sounds are normal. She exhibits no distension. There is no tenderness.  Musculoskeletal: Normal range of motion. She exhibits no edema and no tenderness.  Lymphadenopathy:    She has no cervical adenopathy.  Neurological: She is alert and oriented to person, place, and time. Coordination normal.  Skin: Skin is warm and dry. No rash noted. No erythema.  Psychiatric: She has a normal mood and affect. Her behavior is normal. Judgment and thought content normal.    Assessment and Plan

## 2013-07-22 NOTE — Assessment & Plan Note (Addendum)
I suspect that after her lower extremity edema improved without amlodipine, the Lasix can be weaned off. Suggested she take Lasix every other day. No significant heart failure symptoms.

## 2013-07-22 NOTE — Patient Instructions (Addendum)
Please hold the amlodipine Continue the leg compressions for now, leg elevation  Start clonidine one pill twice a day Take lasix every other day  Please call us if you have new issues that need to be addressed before your next appt.  Your physician wants you to follow-up in: 1 month.

## 2013-07-22 NOTE — Assessment & Plan Note (Signed)
Mild stenosis in 2011. We will repeat echocardiogram some point later this year. She is relatively asymptomatic

## 2013-07-22 NOTE — Assessment & Plan Note (Signed)
Leg swelling likely secondary to amlodipine. This was started around the time her leg swelling started. Lisinopril was stopped at that time. We have suggested she hold the amlodipine, start clonidine 0.1 mg twice a day.

## 2013-07-22 NOTE — Assessment & Plan Note (Signed)
Likely secondary to amlodipine. Very common side effect. Recommended she hold the amlodipine. Given her renal dysfunction, we'll avoid ACE inhibitor, ARB. We'll start clonidine. Certainly need to avoid calcium channel blockers.

## 2013-08-06 ENCOUNTER — Ambulatory Visit: Payer: Self-pay | Admitting: Pain Medicine

## 2013-08-13 ENCOUNTER — Ambulatory Visit: Payer: Self-pay | Admitting: Pain Medicine

## 2013-08-26 ENCOUNTER — Ambulatory Visit (INDEPENDENT_AMBULATORY_CARE_PROVIDER_SITE_OTHER): Payer: Medicare Other | Admitting: Cardiovascular Disease

## 2013-08-26 ENCOUNTER — Encounter: Payer: Self-pay | Admitting: Cardiovascular Disease

## 2013-08-26 VITALS — BP 160/70 | HR 78 | Ht 59.0 in | Wt 131.3 lb

## 2013-08-26 DIAGNOSIS — R609 Edema, unspecified: Secondary | ICD-10-CM

## 2013-08-26 DIAGNOSIS — N183 Chronic kidney disease, stage 3 unspecified: Secondary | ICD-10-CM

## 2013-08-26 DIAGNOSIS — I350 Nonrheumatic aortic (valve) stenosis: Secondary | ICD-10-CM

## 2013-08-26 DIAGNOSIS — I359 Nonrheumatic aortic valve disorder, unspecified: Secondary | ICD-10-CM

## 2013-08-26 DIAGNOSIS — I10 Essential (primary) hypertension: Secondary | ICD-10-CM

## 2013-08-26 DIAGNOSIS — E785 Hyperlipidemia, unspecified: Secondary | ICD-10-CM

## 2013-08-26 DIAGNOSIS — R600 Localized edema: Secondary | ICD-10-CM

## 2013-08-26 MED ORDER — CLONIDINE HCL 0.2 MG PO TABS: 0.2000 mg | ORAL_TABLET | Freq: Two times a day (BID) | ORAL | Status: DC

## 2013-08-26 NOTE — Assessment & Plan Note (Signed)
She's not using Lasix anymore. Lower extremity edema has resolved. She has followup with Dr. Candiss Norse

## 2013-08-26 NOTE — Assessment & Plan Note (Signed)
Mild stenosis in 2011. We will repeat echocardiogram some point in may 2015 or 2016. She is relatively asymptomatic

## 2013-08-26 NOTE — Assessment & Plan Note (Signed)
Blood pressure continues to run high. We have suggested she increase clonidine up to 0.2 mg twice a day. If dry mouth again becomes severe, could try an alternate medication.

## 2013-08-26 NOTE — Assessment & Plan Note (Signed)
Suggested she stay on her Lipitor.

## 2013-08-26 NOTE — Patient Instructions (Signed)
You are doing well. Please increase the clonidine up to 0.2 twice a day  Please call us if you have new issues that need to be addressed before your next appt.  Your physician wants you to follow-up in: 6 months.  You will receive a reminder letter in the mail two months in advance. If you don't receive a letter, please call our office to schedule the follow-up appointment.

## 2013-08-26 NOTE — Assessment & Plan Note (Signed)
Edema has resolved by holding the amlodipine. No longer needing Lasix or her compression boots. All of her wounds have healed.

## 2013-08-26 NOTE — Progress Notes (Signed)
Patient ID: Joanne Michael, female    DOB: June 12, 1924, 80 y.o.   MRN: [id number]  HPI Comments: Joanne Michael is a pleasant 80 yo woman with mild aortic valve stenosis in 2011, GERD, chronic shortness of breath hyperlipidemia, hypertension who recently presented for lower extremity edema that started in December 2014. Amlodipine was held and she was started on clonidine for blood pressure   over the past month, her severe lower extremity edema has resolved. She no longer needs her leg wraps or lymphedema boots. She states that her legs are back to normal, abdominal swelling also better. She feels much better and can now future close. She has been told that blood pressure continues to run high. Initially she had a dry mouth with clonidine 0.1 mg twice a day but now her mouth is normal.  She reports having worsening renal function that started several years ago. She sees Dr. Candiss Norse. Lab work from primary care shows creatinine 2.0, GFR in the 20s she was previously on lisinopril and HCTZ, pill. This was held and she was changed to amlodipine Laboratory from 07/08/2011 shows creatinine 2.1, GFR 22, BUN 21, potassium 3.3  Prior echocardiogram in 2011 showing normal LV systolic function, diastolic relaxation abnormality, mild aortic valve stenosis with no significant gradient, mild MR, high normal right ventricular systolic pressures    Outpatient Encounter Prescriptions as of 08/26/2013  Medication Sig  . allopurinol (ZYLOPRIM) 100 MG tablet Take 100 mg by mouth 2 (two) times daily.  Marland Kitchen aspirin 81 MG tablet Take 81 mg by mouth daily.  Marland Kitchen atorvastatin (LIPITOR) 20 MG tablet Take 20 mg by mouth daily.  . Calcium Carbonate-Vitamin D (CALTRATE 600+D) 600-400 MG-UNIT per tablet Take 1 tablet by mouth daily.    . cetirizine (ZYRTEC) 10 MG chewable tablet Chew 10 mg by mouth daily.    Marland Kitchen escitalopram (LEXAPRO) 10 MG tablet Take 10 mg by mouth daily.    . ferrous sulfate 325 (65 FE) MG tablet Take 325 mg by  mouth daily with breakfast.  . furosemide (LASIX) 20 MG tablet Take 20 mg by mouth daily as needed.  . gabapentin (NEURONTIN) 600 MG tablet Take 600 mg by mouth at bedtime.  . pantoprazole (PROTONIX) 40 MG tablet Take 40 mg by mouth 2 (two) times daily.    . traMADol (ULTRAM) 50 MG tablet Take 50 mg by mouth. Every 4-6 hours prn for pain   . vitamin C (ASCORBIC ACID) 500 MG tablet Take 500 mg by mouth daily.  . cloNIDine (CATAPRES) 0.1 MG tablet Take 1 tablet (0.1 mg total) by mouth 2 (two) times daily.    Review of Systems  Constitutional: Negative.   HENT: Negative.   Eyes: Negative.   Respiratory: Negative.   Cardiovascular: Negative.   Gastrointestinal: Negative.   Endocrine: Negative.   Musculoskeletal: Negative.   Skin: Negative.   Allergic/Immunologic: Negative.   Neurological: Negative.   Hematological: Negative.   Psychiatric/Behavioral: Negative.   All other systems reviewed and are negative.   BP 160/70  Pulse 78  Ht 4\' 11"  (1.499 m)  Wt 131 lb 5 oz (59.563 kg)  BMI 26.51 kg/m2   Physical Exam  Nursing note and vitals reviewed. Constitutional: She is oriented to person, place, and time. She appears well-developed and well-nourished.  HENT:  Head: Normocephalic.  Nose: Nose normal.  Mouth/Throat: Oropharynx is clear and moist.  Eyes: Conjunctivae are normal. Pupils are equal, round, and reactive to light.  Neck: Normal range of motion. Neck  supple. No JVD present.  Cardiovascular: Normal rate, regular rhythm, S1 normal, S2 normal and intact distal pulses.  Exam reveals no gallop and no friction rub.   Murmur heard.  Systolic murmur is present with a grade of 2/6  Significant swelling of her thighs. Legs wrapped knees down to her toes  Pulmonary/Chest: Effort normal and breath sounds normal. No respiratory distress. She has no wheezes. She has no rales. She exhibits no tenderness.  Abdominal: Soft. Bowel sounds are normal. She exhibits no distension. There is no  tenderness.  Musculoskeletal: Normal range of motion. She exhibits no edema and no tenderness.  Lymphadenopathy:    She has no cervical adenopathy.  Neurological: She is alert and oriented to person, place, and time. Coordination normal.  Skin: Skin is warm and dry. No rash noted. No erythema.  Psychiatric: She has a normal mood and affect. Her behavior is normal. Judgment and thought content normal.    Assessment and Plan

## 2013-09-02 ENCOUNTER — Ambulatory Visit: Payer: Self-pay | Admitting: Pain Medicine

## 2013-09-23 ENCOUNTER — Ambulatory Visit: Payer: Self-pay | Admitting: Pain Medicine

## 2013-09-23 ENCOUNTER — Inpatient Hospital Stay: Payer: Self-pay

## 2013-09-23 LAB — CBC
HCT: 37.5 % (ref 35.0–47.0)
HGB: 12.3 g/dL (ref 12.0–16.0)
MCH: 30 pg (ref 26.0–34.0)
MCHC: 32.8 g/dL (ref 32.0–36.0)
MCV: 92 fL (ref 80–100)
PLATELETS: 279 10*3/uL (ref 150–440)
RBC: 4.1 10*6/uL (ref 3.80–5.20)
RDW: 14.3 % (ref 11.5–14.5)
WBC: 8.3 10*3/uL (ref 3.6–11.0)

## 2013-09-23 LAB — BASIC METABOLIC PANEL
Anion Gap: 8 (ref 7–16)
BUN: 23 mg/dL — ABNORMAL HIGH (ref 7–18)
CO2: 29 mmol/L (ref 21–32)
Calcium, Total: 8.7 mg/dL (ref 8.5–10.1)
Chloride: 103 mmol/L (ref 98–107)
Creatinine: 1.57 mg/dL — ABNORMAL HIGH (ref 0.60–1.30)
GFR CALC AF AMER: 34 — AB
GFR CALC NON AF AMER: 30 — AB
Glucose: 90 mg/dL (ref 65–99)
Osmolality: 283 (ref 275–301)
Potassium: 3.5 mmol/L (ref 3.5–5.1)
Sodium: 140 mmol/L (ref 136–145)

## 2013-09-23 LAB — LIPID PANEL
Cholesterol: 167 mg/dL (ref 0–200)
HDL: 56 mg/dL (ref 40–60)
Ldl Cholesterol, Calc: 92 mg/dL (ref 0–100)
TRIGLYCERIDES: 94 mg/dL (ref 0–200)
VLDL Cholesterol, Calc: 19 mg/dL (ref 5–40)

## 2013-09-23 LAB — APTT: Activated PTT: 29 secs (ref 23.6–35.9)

## 2013-09-23 LAB — CK: CK, TOTAL: 49 U/L

## 2013-09-23 LAB — TROPONIN I
Troponin-I: 0.11 ng/mL — ABNORMAL HIGH
Troponin-I: 0.1 ng/mL — ABNORMAL HIGH

## 2013-09-23 LAB — PROTIME-INR
INR: 0.9
PROTHROMBIN TIME: 11.9 s (ref 11.5–14.7)

## 2013-09-24 LAB — URINALYSIS, COMPLETE
BILIRUBIN, UR: NEGATIVE
BLOOD: NEGATIVE
Glucose,UR: NEGATIVE mg/dL (ref 0–75)
Ketone: NEGATIVE
Leukocyte Esterase: NEGATIVE
NITRITE: NEGATIVE
Ph: 6 (ref 4.5–8.0)
Protein: NEGATIVE
RBC,UR: 3 /HPF (ref 0–5)
Specific Gravity: 1.006 (ref 1.003–1.030)
Squamous Epithelial: <1
WBC UR: 3 /HPF (ref 0–5)

## 2013-09-24 LAB — CBC WITH DIFFERENTIAL/PLATELET
BASOS ABS: 0 10*3/uL (ref 0.0–0.1)
Basophil %: 0.3 %
EOS ABS: 0.1 10*3/uL (ref 0.0–0.7)
Eosinophil %: 2.4 %
HCT: 32.3 % — ABNORMAL LOW (ref 35.0–47.0)
HGB: 11 g/dL — AB (ref 12.0–16.0)
LYMPHS ABS: 1.5 10*3/uL (ref 1.0–3.6)
Lymphocyte %: 23.5 %
MCH: 30.8 pg (ref 26.0–34.0)
MCHC: 34 g/dL (ref 32.0–36.0)
MCV: 91 fL (ref 80–100)
MONO ABS: 0.5 x10 3/mm (ref 0.2–0.9)
MONOS PCT: 8.6 %
NEUTROS ABS: 4.1 10*3/uL (ref 1.4–6.5)
Neutrophil %: 65.2 %
PLATELETS: 234 10*3/uL (ref 150–440)
RBC: 3.57 10*6/uL — ABNORMAL LOW (ref 3.80–5.20)
RDW: 14.1 % (ref 11.5–14.5)
WBC: 6.3 10*3/uL (ref 3.6–11.0)

## 2013-09-24 LAB — BASIC METABOLIC PANEL
Anion Gap: 10 (ref 7–16)
BUN: 24 mg/dL — ABNORMAL HIGH (ref 7–18)
CALCIUM: 8.2 mg/dL — AB (ref 8.5–10.1)
CO2: 25 mmol/L (ref 21–32)
Chloride: 108 mmol/L — ABNORMAL HIGH (ref 98–107)
Creatinine: 1.48 mg/dL — ABNORMAL HIGH (ref 0.60–1.30)
GFR CALC AF AMER: 37 — AB
GFR CALC NON AF AMER: 32 — AB
GLUCOSE: 123 mg/dL — AB (ref 65–99)
OSMOLALITY: 290 (ref 275–301)
Potassium: 3.5 mmol/L (ref 3.5–5.1)
SODIUM: 143 mmol/L (ref 136–145)

## 2013-09-24 LAB — HEPARIN LEVEL (UNFRACTIONATED): Anti-Xa(Unfractionated): 0.36 IU/mL (ref 0.30–0.70)

## 2013-09-24 LAB — TROPONIN I: TROPONIN-I: 0.1 ng/mL — AB

## 2013-09-25 LAB — CBC WITH DIFFERENTIAL/PLATELET
Basophil #: 0 10*3/uL (ref 0.0–0.1)
Basophil %: 0.4 %
EOS PCT: 3.9 %
Eosinophil #: 0.2 10*3/uL (ref 0.0–0.7)
HCT: 33.6 % — ABNORMAL LOW (ref 35.0–47.0)
HGB: 11.3 g/dL — ABNORMAL LOW (ref 12.0–16.0)
LYMPHS ABS: 1.6 10*3/uL (ref 1.0–3.6)
Lymphocyte %: 28.1 %
MCH: 30.4 pg (ref 26.0–34.0)
MCHC: 33.8 g/dL (ref 32.0–36.0)
MCV: 90 fL (ref 80–100)
Monocyte #: 0.5 x10 3/mm (ref 0.2–0.9)
Monocyte %: 8.9 %
NEUTROS PCT: 58.7 %
Neutrophil #: 3.4 10*3/uL (ref 1.4–6.5)
PLATELETS: 250 10*3/uL (ref 150–440)
RBC: 3.72 10*6/uL — ABNORMAL LOW (ref 3.80–5.20)
RDW: 14.5 % (ref 11.5–14.5)
WBC: 5.7 10*3/uL (ref 3.6–11.0)

## 2013-09-25 LAB — BASIC METABOLIC PANEL
Anion Gap: 7 (ref 7–16)
BUN: 22 mg/dL — ABNORMAL HIGH (ref 7–18)
CHLORIDE: 107 mmol/L (ref 98–107)
CO2: 27 mmol/L (ref 21–32)
Calcium, Total: 9 mg/dL (ref 8.5–10.1)
Creatinine: 1.45 mg/dL — ABNORMAL HIGH (ref 0.60–1.30)
EGFR (African American): 38 — ABNORMAL LOW
EGFR (Non-African Amer.): 33 — ABNORMAL LOW
GLUCOSE: 115 mg/dL — AB (ref 65–99)
OSMOLALITY: 286 (ref 275–301)
Potassium: 4.3 mmol/L (ref 3.5–5.1)
Sodium: 141 mmol/L (ref 136–145)

## 2013-09-26 LAB — BASIC METABOLIC PANEL
Anion Gap: 6 — ABNORMAL LOW (ref 7–16)
BUN: 19 mg/dL — ABNORMAL HIGH (ref 7–18)
CALCIUM: 9.1 mg/dL (ref 8.5–10.1)
CHLORIDE: 105 mmol/L (ref 98–107)
Co2: 28 mmol/L (ref 21–32)
Creatinine: 1.31 mg/dL — ABNORMAL HIGH (ref 0.60–1.30)
GFR CALC AF AMER: 43 — AB
GFR CALC NON AF AMER: 37 — AB
GLUCOSE: 115 mg/dL — AB (ref 65–99)
Osmolality: 281 (ref 275–301)
Potassium: 4.3 mmol/L (ref 3.5–5.1)
SODIUM: 139 mmol/L (ref 136–145)

## 2013-09-26 LAB — CBC WITH DIFFERENTIAL/PLATELET
BASOS ABS: 0 10*3/uL (ref 0.0–0.1)
Basophil %: 0.3 %
EOS ABS: 0.2 10*3/uL (ref 0.0–0.7)
Eosinophil %: 3.7 %
HCT: 33.1 % — ABNORMAL LOW (ref 35.0–47.0)
HGB: 10.8 g/dL — ABNORMAL LOW (ref 12.0–16.0)
LYMPHS ABS: 1.6 10*3/uL (ref 1.0–3.6)
Lymphocyte %: 30.2 %
MCH: 29.9 pg (ref 26.0–34.0)
MCHC: 32.7 g/dL (ref 32.0–36.0)
MCV: 92 fL (ref 80–100)
MONO ABS: 0.4 x10 3/mm (ref 0.2–0.9)
Monocyte %: 8.1 %
NEUTROS ABS: 3 10*3/uL (ref 1.4–6.5)
NEUTROS PCT: 57.7 %
Platelet: 256 10*3/uL (ref 150–440)
RBC: 3.62 10*6/uL — ABNORMAL LOW (ref 3.80–5.20)
RDW: 14.3 % (ref 11.5–14.5)
WBC: 5.2 10*3/uL (ref 3.6–11.0)

## 2013-09-28 LAB — CULTURE, BLOOD (SINGLE)

## 2013-10-20 ENCOUNTER — Other Ambulatory Visit: Payer: Self-pay

## 2013-10-20 MED ORDER — CLONIDINE HCL 0.2 MG PO TABS: 0.2000 mg | ORAL_TABLET | Freq: Two times a day (BID) | ORAL | Status: DC

## 2013-10-20 NOTE — Telephone Encounter (Signed)
Refill sent for clonidine.

## 2013-10-22 ENCOUNTER — Other Ambulatory Visit: Payer: Self-pay

## 2013-10-22 MED ORDER — CLONIDINE HCL 0.2 MG PO TABS: 0.2000 mg | ORAL_TABLET | Freq: Two times a day (BID) | ORAL | Status: DC

## 2013-10-22 NOTE — Telephone Encounter (Signed)
Refill sent for clonidine.

## 2013-10-22 NOTE — Telephone Encounter (Signed)
Refill sent for clonidine HCL  0.2 mg

## 2013-10-23 ENCOUNTER — Other Ambulatory Visit: Payer: Self-pay

## 2013-10-23 MED ORDER — CLONIDINE HCL 0.2 MG PO TABS: 0.2000 mg | ORAL_TABLET | Freq: Two times a day (BID) | ORAL | Status: DC

## 2013-10-23 NOTE — Telephone Encounter (Signed)
Refill sent for clonidine.

## 2013-10-27 ENCOUNTER — Ambulatory Visit: Payer: Self-pay | Admitting: Oncology

## 2013-11-03 ENCOUNTER — Encounter: Payer: Self-pay | Admitting: Internal Medicine

## 2013-11-03 ENCOUNTER — Ambulatory Visit (INDEPENDENT_AMBULATORY_CARE_PROVIDER_SITE_OTHER): Payer: Medicare Other | Admitting: Internal Medicine

## 2013-11-03 VITALS — BP 132/64 | HR 75 | Ht 59.0 in | Wt 127.0 lb

## 2013-11-03 DIAGNOSIS — R0602 Shortness of breath: Secondary | ICD-10-CM | POA: Insufficient documentation

## 2013-11-03 DIAGNOSIS — R059 Cough, unspecified: Secondary | ICD-10-CM

## 2013-11-03 DIAGNOSIS — R05 Cough: Secondary | ICD-10-CM | POA: Insufficient documentation

## 2013-11-03 NOTE — Progress Notes (Signed)
Date: 11/03/2013  MRN# [id number] Joanne Michael 1924/11/15   Joanne Michael is a 80 y.o. old female seen in consultation for worsening cough and shortness of breath.  CC:  Chief Complaint  Patient presents with  . Advice Only    Referred for cough.  Pt c/o having PNA this summer, prod cough with white foamy mucus Xfew months.     HPI:  Patient is a pleasant 80 year old female, accompanied by her grandson, presents to clinic today for further evaluation of the ongoing cough and shortness of breath. Patient stated about 4 weeks ago she had a pneumonia, as a second one in 2 years, of the left lower lobe, she was seen and treated with Levaquin and a prednisone taper which provided improvement in her overall clinical status. Patient states that her initial symptoms were productive cough, greenish to yellow to brown, shortness of breath, fever, and fatigue which persisted for 3-4 days before she finally saw her primary care physician and obtain a chest x-ray which showed left lower lobe basilar opacity. She was treated for community acquired pneumonia with the above regimen. Patient states that overall her symptoms are improving except for this cough and mild to moderate dyspnea on exertion. He describes the cough as productive, with whitish foamy mucus, sometimes with brownish specks. She states her shortness of breath is moderately improve but she still has some but would go into the kitchen and bathroom, where she has to take a break. Her PMD has prescribed her albuterol for shortness of breath and wheezing as needed every 4 hours, 1-2 puffs. Today she denies any fever, but does state that she has early morning sweats still. She states that she has a history of sinusitis and takes Flonase 1 puff to each nostril in the morning. PMHX:   Past Medical History  Diagnosis Date  . HLD (hyperlipidemia)   . HTN (hypertension)   . Cat allergies   . Hay fever     as child  . GERD (gastroesophageal reflux  disease)   . Arthritis   . Fatigue   . Iron deficiency anemia   . Chronic renal failure   . Spinal stenosis   . Gout   . H/O Clostridium difficile infection   . Hiatal hernia    Surgical Hx:  Past Surgical History  Procedure Laterality Date  . Cataract extraction    . Tonsillectomy    . Appendectomy    . Total abdominal hysterectomy    . Knee replacement- both    . Cholecystectomy    . Nose surgery    . Shoulder surgery    . Feet surgery    . Back surgery    . Hernia repair    . Rotator cuff repair      left   Family Hx:  Family History  Problem Relation Age of Onset  . Ovarian cancer Mother   . Liver cancer Father   . Heart failure Father   . Lung cancer Brother   . Prostate cancer Brother   . Lung cancer Sister    Social Hx:   History  Substance Use Topics  . Smoking status: Never Smoker   . Smokeless tobacco: Never Used     Comment: Tobacco use- no   . Alcohol Use: No   Medication:   Current Outpatient Rx  Name  Route  Sig  Dispense  Refill  . albuterol (PROVENTIL HFA;VENTOLIN HFA) 108 (90 BASE) MCG/ACT inhaler   Inhalation  Inhale 2 puffs into the lungs every 6 (six) hours as needed for wheezing or shortness of breath.         . allopurinol (ZYLOPRIM) 100 MG tablet   Oral   Take 100 mg by mouth 2 (two) times daily.         Marland Kitchen aspirin 81 MG tablet   Oral   Take 81 mg by mouth daily.         Marland Kitchen atorvastatin (LIPITOR) 20 MG tablet   Oral   Take 20 mg by mouth daily.         . Calcium Carbonate-Vitamin D (CALTRATE 600+D) 600-400 MG-UNIT per tablet   Oral   Take 1 tablet by mouth daily.           . cetirizine (ZYRTEC) 10 MG chewable tablet   Oral   Chew 10 mg by mouth daily.           . cloNIDine (CATAPRES) 0.2 MG tablet   Oral   Take 1 tablet (0.2 mg total) by mouth 2 (two) times daily.   90 tablet   3   . ferrous sulfate 325 (65 FE) MG tablet   Oral   Take 325 mg by mouth daily with breakfast.         . furosemide (LASIX)  20 MG tablet   Oral   Take 20 mg by mouth daily as needed.         . gabapentin (NEURONTIN) 600 MG tablet   Oral   Take 600 mg by mouth at bedtime.         . pantoprazole (PROTONIX) 40 MG tablet   Oral   Take 40 mg by mouth 2 (two) times daily.           . Probiotic Product (PHILLIPS COLON HEALTH PO)   Oral   Take 1 capsule by mouth daily.         . traMADol (ULTRAM) 50 MG tablet   Oral   Take 50 mg by mouth. Every 4-6 hours prn for pain          . vitamin C (ASCORBIC ACID) 500 MG tablet   Oral   Take 500 mg by mouth daily.             Allergies:  Codeine; Hydrocodone-acetaminophen; Iodine; Meperidine hcl; and Propoxyphene n-acetaminophen  Review of Systems: Gen:  Denies  fever, sweats, chills HEENT: Denies blurred vision, double vision, ear pain, eye pain, hearing loss, nose bleeds, sore throat, hoarseness Cvc:  No dizziness, chest pain or heaviness Resp:   Denies cough or sputum porduction, shortness of breath Gi: Denies swallowing difficulty, stomach pain, nausea or vomiting, diarrhea, constipation, bowel incontinence Gu:  Denies bladder incontinence, burning urine Ext:   No Joint pain, stiffness or swelling Skin: No skin rash, easy bruising or bleeding or hives Endoc:  No polyuria, polydipsia , polyphagia or weight change Psych: No depression, insomnia or hallucinations  Other:  All other systems negative  Physical Examination:   VS: BP 132/64  Pulse 75  Ht 4\' 11"  (1.499 m)  Wt 127 lb (57.607 kg)  BMI 25.64 kg/m2  SpO2 93%  General Appearance: No distress  Neuro:without focal findings, mental status, speech normal, alert and oriented, cranial nerves 2-12 intact, reflexes normal and symmetric, sensation grossly normal  HEENT: PERRLA, EOM intact, no ptosis, no other lesions noticed; Mallampati 2 Pulmonary: Good respiratory effort, mild diffuse wheezing at end inspiration and and expiration more prominent in  the left lower lobe. Productive cough:  Whitish sputum  CardiovascularNormal S1,S2.  No m/r/g.  Abdominal aorta pulsation normal.    Abdomen: Benign, Soft, non-tender, No masses, hepatosplenomegaly, No lymphadenopathy Renal:  No costovertebral tenderness  GU:  No performed at this time. Endoc: No evident thyromegaly, no signs of acromegaly or Cushing features Skin:   warm, no rashes, no ecchymosis  Extremities: normal, no cyanosis, clubbing, no edema, warm with normal capillary refill.     Assessment and Plan: SHORTNESS OF BREATH Multifactorial: Recent infection, cough, deconditioning, advanced age. Patient advised to increase activity as tolerated, I believe that she is deconditioned from her recent bout of pneumonia and will take some time to recover. Her cough is most likely postinfectious, and adding to her dyspnea. Management for cough as stated below. As stated below, will plan for a CT chest without contrast to further evaluate for resolution of pneumonia and any other underlying factors.  Cough Most likely this is a post infectious cough. Given that she's had recurrent upper respiratory tract infections and pneumonia twice in the past 2 years will further evaluate with CT of chest without contrast. Continue with when necessary albuterol, patient educated and advise on proper usage and indication of albuterol. Review of her recent sputum cultures show that she has oral pharyngeal flora with some gram-negative rods. Given that she is improving with no fever and her symptoms are clinically improving will not treat at this time. Followup CT chest.  SOB (shortness of breath) Same as above    Updated Medication List Outpatient Encounter Prescriptions as of 11/03/2013  Medication Sig  . albuterol (PROVENTIL HFA;VENTOLIN HFA) 108 (90 BASE) MCG/ACT inhaler Inhale 2 puffs into the lungs every 6 (six) hours as needed for wheezing or shortness of breath.  . allopurinol (ZYLOPRIM) 100 MG tablet Take 100 mg by mouth 2 (two) times  daily.  Marland Kitchen aspirin 81 MG tablet Take 81 mg by mouth daily.  Marland Kitchen atorvastatin (LIPITOR) 20 MG tablet Take 20 mg by mouth daily.  . Calcium Carbonate-Vitamin D (CALTRATE 600+D) 600-400 MG-UNIT per tablet Take 1 tablet by mouth daily.    . cetirizine (ZYRTEC) 10 MG chewable tablet Chew 10 mg by mouth daily.    . cloNIDine (CATAPRES) 0.2 MG tablet Take 1 tablet (0.2 mg total) by mouth 2 (two) times daily.  . ferrous sulfate 325 (65 FE) MG tablet Take 325 mg by mouth daily with breakfast.  . furosemide (LASIX) 20 MG tablet Take 20 mg by mouth daily as needed.  . gabapentin (NEURONTIN) 600 MG tablet Take 600 mg by mouth at bedtime.  . pantoprazole (PROTONIX) 40 MG tablet Take 40 mg by mouth 2 (two) times daily.    . Probiotic Product (PHILLIPS COLON HEALTH PO) Take 1 capsule by mouth daily.  . traMADol (ULTRAM) 50 MG tablet Take 50 mg by mouth. Every 4-6 hours prn for pain   . vitamin C (ASCORBIC ACID) 500 MG tablet Take 500 mg by mouth daily.  . [DISCONTINUED] escitalopram (LEXAPRO) 10 MG tablet Take 10 mg by mouth daily.      Orders for this visit: Orders Placed This Encounter  Procedures  . CT Chest Wo Contrast    Standing Status: Future     Number of Occurrences:      Standing Expiration Date: 01/04/2015    Order Specific Question:  Reason for Exam (SYMPTOM  OR DIAGNOSIS REQUIRED)    Answer:  cough, sob    Order Specific Question:  Preferred imaging  location?    Answer:  [facility]     Thank  you for the consultation and for allowing [facility] and to assist in the care of your patient. Our recommendations are noted above.  Please contact us if we can be of further service.   Vilinda Boehringer, MD  Pulmonary and Critical Care Office Number: [phone]

## 2013-11-03 NOTE — Patient Instructions (Signed)
We will schedule a CT scan of your chest at [facility].  We will see you back in 1 month.

## 2013-11-03 NOTE — Assessment & Plan Note (Addendum)
Most likely this is a post infectious cough. Given that she's had recurrent upper respiratory tract infections and pneumonia twice in the past 2 years will further evaluate with CT of chest without contrast. Continue with when necessary albuterol, patient educated and advise on proper usage and indication of albuterol. Review of her recent sputum cultures show that she has oral pharyngeal flora with some gram-negative rods. Given that she is improving with no fever and her symptoms are clinically improving will not treat at this time. Followup CT chest.

## 2013-11-03 NOTE — Assessment & Plan Note (Addendum)
Multifactorial: Recent infection, cough, deconditioning, advanced age. Patient advised to increase activity as tolerated, I believe that she is deconditioned from her recent bout of pneumonia and will take some time to recover. Her cough is most likely postinfectious, and adding to her dyspnea. Management for cough as stated below. As stated below, will plan for a CT chest without contrast to further evaluate for resolution of pneumonia and any other underlying factors.

## 2013-11-03 NOTE — Assessment & Plan Note (Signed)
Same as above

## 2013-11-12 ENCOUNTER — Ambulatory Visit: Payer: Self-pay | Admitting: Internal Medicine

## 2013-11-16 ENCOUNTER — Telehealth: Payer: Self-pay | Admitting: Internal Medicine

## 2013-11-16 NOTE — Telephone Encounter (Signed)
12.30pm>> Called patient's grandson, patient was with him at the time of the call. Discus the CT results with both patient and her grandson. She has a LLL mass. At this time she requested further follow up for recommendations and advice from Heme\Onc. Patient request Dr. Grayland Ormond ([place]), she stated that Dr. Grayland Ormond treated her Husband, and she would prefer to see him. I discussed options of bronchoscopy with biopsy and repeat imaging with the patient and her grandson, however, they would like to speak with Heme\Onc first. I believe with her advance age and co-morbities this is an acceptable first step in evaluation of this LLL mass.   12:50pm>> Spoke with Dr. Grayland Ormond.  He will present the patient at [facility] on Thursday 11/19/13. His office will contact patient and setup future appointments pertaining the LLL mass.  Patient will keep future appointment with [facility].    1:05pm >> called back Patient's grandson, who was with the patient, and relayed above plan and message.

## 2013-11-16 NOTE — Telephone Encounter (Signed)
Spoke with Holley Raring, at [facility]  She states that the pt came in to see them on Friday 9/18 and asked about the CT results  The PA there was able to inform the pt and her son of the results, but want Dr Stevenson Clinch to f/u with plan  I advised we will have him review the results today and will call the pt with recs  She provided [place] with the pt's son Joshua's number in case pt was not at home- [phone]  Please advise thanks!

## 2013-11-16 NOTE — Telephone Encounter (Signed)
Text sent to VM for his recs.

## 2013-11-16 NOTE — Telephone Encounter (Signed)
Called Dr Mclaughlin's office back and had to [facility]   I looked on the PACS system and found the CT Chest report from [facility]  CT was done on 11/12/13 and the impression is as follows:  No comparison films  1- 2.7 x 2 cm soft tissue mass in the upper medial aspect of the left lower lobe with post obstructive atelectasis. Rec oncology consultation.   2- There is s 7 mm ground-glass middle lobe pulmonary nodule.  If the pt is high risk for bronchogenic CA, f/u ct is rec at 3-6 months is rec.  If the pt is low risk CT is rec at 6-12 months  I have spoke with Caryl Pina, Dr Merian Capron nurse and she will alert him by text that I am sending this msg critical result to his basket now   Please advise recs thanks!

## 2013-11-19 ENCOUNTER — Ambulatory Visit: Payer: Self-pay | Admitting: Oncology

## 2013-11-19 ENCOUNTER — Other Ambulatory Visit: Payer: Self-pay | Admitting: Pain Medicine

## 2013-11-19 ENCOUNTER — Ambulatory Visit: Payer: Self-pay | Admitting: Pain Medicine

## 2013-11-19 LAB — CBC WITH DIFFERENTIAL/PLATELET
Basophil #: 0 10*3/uL (ref 0.0–0.1)
Basophil %: 0.4 %
Eosinophil #: 0.2 10*3/uL (ref 0.0–0.7)
Eosinophil %: 2.4 %
HCT: 36.3 % (ref 35.0–47.0)
HGB: 11.5 g/dL — ABNORMAL LOW (ref 12.0–16.0)
Lymphocyte #: 1.3 10*3/uL (ref 1.0–3.6)
Lymphocyte %: 20.6 %
MCH: 29 pg (ref 26.0–34.0)
MCHC: 31.7 g/dL — ABNORMAL LOW (ref 32.0–36.0)
MCV: 92 fL (ref 80–100)
Monocyte #: 0.4 x10 3/mm (ref 0.2–0.9)
Monocyte %: 6.6 %
Neutrophil #: 4.6 10*3/uL (ref 1.4–6.5)
Neutrophil %: 70 %
Platelet: 290 10*3/uL (ref 150–440)
RBC: 3.96 10*6/uL (ref 3.80–5.20)
RDW: 14.4 % (ref 11.5–14.5)
WBC: 6.5 10*3/uL (ref 3.6–11.0)

## 2013-11-26 ENCOUNTER — Ambulatory Visit: Payer: Self-pay | Admitting: Internal Medicine

## 2013-11-26 ENCOUNTER — Ambulatory Visit: Payer: Self-pay | Admitting: Oncology

## 2013-11-28 ENCOUNTER — Ambulatory Visit: Payer: Self-pay | Admitting: Oncology

## 2013-12-02 ENCOUNTER — Ambulatory Visit (INDEPENDENT_AMBULATORY_CARE_PROVIDER_SITE_OTHER): Payer: Medicare Other | Admitting: Internal Medicine

## 2013-12-02 ENCOUNTER — Encounter: Payer: Self-pay | Admitting: Internal Medicine

## 2013-12-02 VITALS — BP 130/68 | HR 65 | Ht 59.0 in | Wt 130.0 lb

## 2013-12-02 DIAGNOSIS — R918 Other nonspecific abnormal finding of lung field: Secondary | ICD-10-CM

## 2013-12-02 DIAGNOSIS — R059 Cough, unspecified: Secondary | ICD-10-CM

## 2013-12-02 DIAGNOSIS — R05 Cough: Secondary | ICD-10-CM

## 2013-12-02 NOTE — Patient Instructions (Signed)
We will do another chest CT scan in 4 months (Feb. 2015) We will set you up with Dr. Ola Spurr at [facility].  We will follow up in 4 months after the CT scan.  Call us if you need [place] sooner.

## 2013-12-02 NOTE — Progress Notes (Signed)
MRN# [id number] Joanne Michael Aug 17, 1924   CC: Chief Complaint  Patient presents with  . Follow-up    rov post ct and bronch.  Pt c/o feeling like "something is stuck in throat", indigestion, sometimes prod cough with clear/white blood tinged mucus.        Brief History: 11/03/13 - Patient is a pleasant 80 year old female, accompanied by her grandson, presents to clinic today for further evaluation of the ongoing cough and shortness of breath.  Patient stated about 4 weeks ago she had a pneumonia, as a second one in 2 years, of the left lower lobe, she was seen and treated with Levaquin and a prednisone taper which provided improvement in her overall clinical status. Patient states that her initial symptoms were productive cough, greenish to yellow to brown, shortness of breath, fever, and fatigue which persisted for 3-4 days before she finally saw her primary care physician and obtain a chest x-ray which showed left lower lobe basilar opacity. She was treated for community acquired pneumonia with the above regimen. Patient states that overall her symptoms are improving except for this cough and mild to moderate dyspnea on exertion. He describes the cough as productive, with whitish foamy mucus, sometimes with brownish specks. She states her shortness of breath is moderately improve but she still has some but would go into the kitchen and bathroom, where she has to take a break. Her PMD has prescribed her albuterol for shortness of breath and wheezing as needed every 4 hours, 1-2 puffs. Today she denies any fever, but does state that she has early morning sweats still. She states that she has a history of sinusitis and takes Flonase 1 puff to each nostril in the morning. - At this visit she had a CT chest performed, which showed a 2.7 x 2 cm left lower lobe mass, she had a bronchoscopy done by Dr. Mortimer Fries (FNA, endobronchial lesion), preliminary esophageal report at this time is negative for malignant  cells   Events since last clinic visit:  Patient presents today for followup visit, she is accompanied by her grandson. Since her last visit she has had a CT of the chest which showed a 2.7 x 2 cm left lower lobe mass, bronchoscopy done with biopsies (preliminary pathology report with no malignant cells), sputum AFB currently positive and awaiting final cultures. Today she states that her breathing is stable has not gotten worse, she still has some mild whitish sputum production occasionally in the mornings, but she still having early morning sweats. She states overall that her breathing is stable and she has no further complaints.     PMHX:   Past Medical History  Diagnosis Date  . HLD (hyperlipidemia)   . HTN (hypertension)   . Cat allergies   . Hay fever     as child  . GERD (gastroesophageal reflux disease)   . Arthritis   . Fatigue   . Iron deficiency anemia   . Chronic renal failure   . Spinal stenosis   . Gout   . H/O Clostridium difficile infection   . Hiatal hernia    Surgical Hx:  Past Surgical History  Procedure Laterality Date  . Cataract extraction    . Tonsillectomy    . Appendectomy    . Total abdominal hysterectomy    . Knee replacement- both    . Cholecystectomy    . Nose surgery    . Shoulder surgery    . Feet surgery    . Back  surgery    . Hernia repair    . Rotator cuff repair      left   Family Hx:  Family History  Problem Relation Age of Onset  . Ovarian cancer Mother   . Liver cancer Father   . Heart failure Father   . Lung cancer Brother   . Prostate cancer Brother   . Lung cancer Sister    Social Hx:   History  Substance Use Topics  . Smoking status: Never Smoker   . Smokeless tobacco: Never Used     Comment: Tobacco use- no   . Alcohol Use: No   Medication:   Current Outpatient Rx  Name  Route  Sig  Dispense  Refill  . albuterol (PROVENTIL HFA;VENTOLIN HFA) 108 (90 BASE) MCG/ACT inhaler   Inhalation   Inhale 2 puffs into  the lungs every 6 (six) hours as needed for wheezing or shortness of breath.         . allopurinol (ZYLOPRIM) 100 MG tablet   Oral   Take 100 mg by mouth 2 (two) times daily.         Marland Kitchen aspirin 81 MG tablet   Oral   Take 81 mg by mouth daily.         Marland Kitchen atorvastatin (LIPITOR) 20 MG tablet   Oral   Take 20 mg by mouth daily.         . Calcium Carbonate-Vitamin D (CALTRATE 600+D) 600-400 MG-UNIT per tablet   Oral   Take 1 tablet by mouth daily.           . cetirizine (ZYRTEC) 10 MG chewable tablet   Oral   Chew 10 mg by mouth daily.           . cloNIDine (CATAPRES) 0.2 MG tablet   Oral   Take 1 tablet (0.2 mg total) by mouth 2 (two) times daily.   90 tablet   3   . ferrous sulfate 325 (65 FE) MG tablet   Oral   Take 325 mg by mouth daily with breakfast.         . furosemide (LASIX) 20 MG tablet   Oral   Take 20 mg by mouth daily as needed.         . gabapentin (NEURONTIN) 600 MG tablet   Oral   Take 600 mg by mouth at bedtime.         . pantoprazole (PROTONIX) 40 MG tablet   Oral   Take 40 mg by mouth 2 (two) times daily.           . Probiotic Product (PHILLIPS COLON HEALTH PO)   Oral   Take 1 capsule by mouth daily.         . traMADol (ULTRAM) 50 MG tablet   Oral   Take 50 mg by mouth. Every 4-6 hours prn for pain          . vitamin C (ASCORBIC ACID) 500 MG tablet   Oral   Take 500 mg by mouth daily.            Review of Systems: Gen:  Denies  fever, sweats, chills HEENT: Denies blurred vision, double vision, ear pain, eye pain, hearing loss, nose bleeds, sore throat Cvc:  No dizziness, chest pain or heaviness Resp:   Denies cough or sputum porduction, shortness of breath Gi: Denies swallowing difficulty, stomach pain, nausea or vomiting, diarrhea, constipation, bowel incontinence Gu:  Denies bladder incontinence, burning urine  Ext:   No Joint pain, stiffness or swelling Skin: No skin rash, easy bruising or bleeding or  hives Endoc:  No polyuria, polydipsia , polyphagia or weight change Psych: No depression, insomnia or hallucinations  Other:  All other systems negative  Allergies:  Codeine; Hydrocodone-acetaminophen; Iodine; Meperidine hcl; and Propoxyphene n-acetaminophen  Physical Examination:  VS: BP 130/68  Pulse 65  Ht 4\' 11"  (1.499 m)  Wt 130 lb (58.968 kg)  BMI 26.24 kg/m2  SpO2 96%  General Appearance: No distress  HEENT: PERRLA, EOM intact, no ptosis, no other lesions noticed Pulmonary:Exam: Good airway entry. Mild decreased breath sounds at the bilateral bases. No adventitious sounds. No rales crackles or rhonchi. Production: None Cardiovascular:@ Exam:  Normal S1,S2.  No m/r/g.     Abdomen:Exam: Benign, Soft, non-tender, No masses  Skin:   warm, no rashes, no ecchymosis  Extremities: normal, no cyanosis, clubbing, no edema, warm with normal capillary refill.   Labs results:  BMP No results found for this basename: na,  k,  cl,  co2,  glucose,  bun,  creatinine     CBC No flowsheet data found.   Rad results:  CT scan chest 11/12/2013 The central airways are patent. There is mild biapical scarring. There is a ground-glass subpleural 7 mm right middle lobe pulmonary nodule. There is a 2.5 mm subpleural left lower lobe pulmonary nodule. There is a left lower lobe mass with postobstructive atelectasis measuring 2.7 x 2 cm in the upper medial aspect of the left lower lobe. There is a trace left pleural effusion. The right lung is clear.  There are no pathologically enlarged axillary, hilar or mediastinal lymph nodes.  The heart size is normal. There is no pericardial effusion. The thoracic aorta is normal in caliber. There is coronary artery atherosclerosis in the LAD and RCA.  Review of bone windows demonstrates no focal lytic or sclerotic lesions. There is lower thoracic spine and upper lumbar spine degenerative disc disease. There is severe degenerative disc disease at  T12-L1.  Limited non-contrast images of the upper abdomen were obtained. The adrenal glands appear normal. The remainder of the visualized abdominal organs are unremarkable.  IMPRESSION: 1. 2.7 x 2 cm soft tissue mass in the upper medial aspect of the left lower lobe with post obstructive atelectasis. Recommend oncology consultation. 2. There is a 7 mm ground-glass right middle lobe pulmonary nodule. If the patient is at high risk for bronchogenic carcinoma, follow-up chest CT at 3-24months is recommended. If the patient is at low risk for bronchogenic carcinoma, follow-up chest CT at 6-12 months is recommended. This recommendation follows the consensus statement: Guidelines for Management of Small Pulmonary Nodules Detected on CT Scans: A Statement from the [place] as published in Radiology 2005; 237:395-400.      Assessment and Plan: Pulmonary parenchymal mass This mass is still concerning for underlying malignancy,  however given that she has a negative biopsy via FNA and with positive AFB, infection is still a high likelihood (pulmonary abscess is of concern). She's still having episodes of morning sweats, no other symptoms of increased sputum production, increased shortness of breath, rapid weight loss. Most likely a sequelae of her recent pneumonia in July, currently growing atypical mycobacteria. Will refer for further evaluation and treatment recommendations to infectious disease, Dr. Ola Spurr at [facility]. Will continue to followup on results of forcep biopsies. Repeat CT chest without contrast in 4 months.   Cough Currently improving. Most likely postinfectious from her recent pneumonia and  with sequelae of left lower lobe mass, is adding to her cough. Continue to monitor, and provide supportive care. Treatment and workup of this left lower lobe mass may provide complete relief/resolution of her cough in the near future.  Lung mass See assessment and  plan for pulmonary parenchymal mass    Updated Medication List Outpatient Encounter Prescriptions as of 12/02/2013  Medication Sig  . albuterol (PROVENTIL HFA;VENTOLIN HFA) 108 (90 BASE) MCG/ACT inhaler Inhale 2 puffs into the lungs every 6 (six) hours as needed for wheezing or shortness of breath.  . allopurinol (ZYLOPRIM) 100 MG tablet Take 100 mg by mouth 2 (two) times daily.  Marland Kitchen aspirin 81 MG tablet Take 81 mg by mouth daily.  Marland Kitchen atorvastatin (LIPITOR) 20 MG tablet Take 20 mg by mouth daily.  . Calcium Carbonate-Vitamin D (CALTRATE 600+D) 600-400 MG-UNIT per tablet Take 1 tablet by mouth daily.    . cetirizine (ZYRTEC) 10 MG chewable tablet Chew 10 mg by mouth daily.    . cloNIDine (CATAPRES) 0.2 MG tablet Take 1 tablet (0.2 mg total) by mouth 2 (two) times daily.  . ferrous sulfate 325 (65 FE) MG tablet Take 325 mg by mouth daily with breakfast.  . fluticasone (FLONASE) 50 MCG/ACT nasal spray Place 2 sprays into both nostrils daily.  . furosemide (LASIX) 20 MG tablet Take 20 mg by mouth daily as needed.  . gabapentin (NEURONTIN) 600 MG tablet Take 600 mg by mouth at bedtime.  . pantoprazole (PROTONIX) 40 MG tablet Take 40 mg by mouth 2 (two) times daily.    . Probiotic Product (PHILLIPS COLON HEALTH PO) Take 1 capsule by mouth daily.  . traMADol (ULTRAM) 50 MG tablet Take 50 mg by mouth. Every 4-6 hours prn for pain   . vitamin C (ASCORBIC ACID) 500 MG tablet Take 500 mg by mouth daily.    Orders for this visit: Orders Placed This Encounter  Procedures  . CT Chest W Contrast    Standing Status: Future     Number of Occurrences:      Standing Expiration Date: 02/02/2015    Scheduling Instructions:     Please schedule in 4 months.  Thank you.    Order Specific Question:  Reason for Exam (SYMPTOM  OR DIAGNOSIS REQUIRED)    Answer:  lung mass    Order Specific Question:  Preferred imaging location?    Answer:  [facility]  . Ambulatory referral to Infectious Disease     Referral Priority:  Routine    Referral Type:  Consultation    Referral Reason:  Specialty Services Required    Requested Specialty:  Infectious Diseases    Number of Visits Requested:  1    Thank  you for the visitation and for allowing  [facility] to assist in the care of your patient. Our recommendations are noted above.  Please contact us if we can be of further service.  Vilinda Boehringer, MD  Pulmonary and Critical Care Office Number: [phone]

## 2013-12-02 NOTE — Assessment & Plan Note (Signed)
Currently improving. Most likely postinfectious from her recent pneumonia and with sequelae of left lower lobe mass, is adding to her cough. Continue to monitor, and provide supportive care. Treatment and workup of this left lower lobe mass may provide complete relief/resolution of her cough in the near future.

## 2013-12-02 NOTE — Assessment & Plan Note (Addendum)
This mass is still concerning for underlying malignancy,  however given that she has a negative biopsy via FNA and with positive AFB, infection is still a high likelihood (pulmonary abscess is of concern). She's still having episodes of morning sweats, no other symptoms of increased sputum production, increased shortness of breath, rapid weight loss. Most likely a sequelae of her recent pneumonia in July, currently growing atypical mycobacteria. Will refer for further evaluation and treatment recommendations to infectious disease, Dr. Ola Spurr at [facility]. Will continue to followup on results of forcep biopsies. Repeat CT chest without contrast in 4 months.

## 2013-12-02 NOTE — Assessment & Plan Note (Signed)
See assessment and plan for pulmonary parenchymal mass

## 2013-12-03 ENCOUNTER — Ambulatory Visit: Payer: Medicare Other | Admitting: Internal Medicine

## 2013-12-21 ENCOUNTER — Ambulatory Visit: Payer: Self-pay | Admitting: Pain Medicine

## 2013-12-27 ENCOUNTER — Ambulatory Visit: Payer: Self-pay | Admitting: Oncology

## 2014-01-07 ENCOUNTER — Ambulatory Visit: Payer: Self-pay | Admitting: Gastroenterology

## 2014-02-02 ENCOUNTER — Ambulatory Visit: Payer: Self-pay | Admitting: Oncology

## 2014-02-16 ENCOUNTER — Encounter: Payer: Self-pay | Admitting: Cardiovascular Disease

## 2014-02-16 ENCOUNTER — Ambulatory Visit (INDEPENDENT_AMBULATORY_CARE_PROVIDER_SITE_OTHER): Payer: Medicare Other | Admitting: Cardiovascular Disease

## 2014-02-16 VITALS — BP 118/58 | HR 54 | Ht 59.0 in | Wt 130.5 lb

## 2014-02-16 DIAGNOSIS — R0602 Shortness of breath: Secondary | ICD-10-CM

## 2014-02-16 DIAGNOSIS — R600 Localized edema: Secondary | ICD-10-CM

## 2014-02-16 DIAGNOSIS — I350 Nonrheumatic aortic (valve) stenosis: Secondary | ICD-10-CM

## 2014-02-16 DIAGNOSIS — I10 Essential (primary) hypertension: Secondary | ICD-10-CM

## 2014-02-16 DIAGNOSIS — E785 Hyperlipidemia, unspecified: Secondary | ICD-10-CM

## 2014-02-16 DIAGNOSIS — N2889 Other specified disorders of kidney and ureter: Secondary | ICD-10-CM

## 2014-02-16 DIAGNOSIS — N183 Chronic kidney disease, stage 3 unspecified: Secondary | ICD-10-CM

## 2014-02-16 DIAGNOSIS — R918 Other nonspecific abnormal finding of lung field: Secondary | ICD-10-CM

## 2014-02-16 NOTE — Assessment & Plan Note (Signed)
Encouraged her to stay on her Lipitor 

## 2014-02-16 NOTE — Assessment & Plan Note (Signed)
Dramatic improvement of her renal function by holding Lasix. Likely down to her baseline

## 2014-02-16 NOTE — Progress Notes (Signed)
Patient ID: Joanne Michael, female    DOB: 10/29/1924, 80 y.o.   MRN: [id number]  HPI Comments: Ms Merryfield is a pleasant 80 yo woman with mild aortic valve stenosis in 2011, GERD, chronic shortness of breath hyperlipidemia, hypertension who recently presented for lower extremity edema that started in December 2014. Amlodipine was held and she was started on clonidine for blood pressure She presents today for follow-up of her blood pressure, leg swelling  In general she feels well with no complaints. She is active, reports swelling has been stable. She uses compression hose. She has not used her lymphedema compression pump for the past month with no change in her swelling Occasionally has applications She takes Lasix when necessary Dramatic improvement in her renal function  EKG shows normal sinus rhythm with rate 52 bpm, no significant ST or T-wave changes  Other past medical history Lab work from primary care shows creatinine 2.0, GFR in the 20s she was previously on lisinopril and HCTZ, pill. This was held and she was changed to amlodipine Laboratory from 07/08/2011 shows creatinine 2.1, GFR 22, BUN 21, potassium 3.3  Prior echocardiogram in 2011 showing normal LV systolic function, diastolic relaxation abnormality, mild aortic valve stenosis with no significant gradient, mild MR, high normal right ventricular systolic pressures   Allergies  Allergen Reactions  . Codeine   . Hydrocodone-Acetaminophen   . Iodine   . Meperidine Hcl   . Propoxyphene N-Acetaminophen     Outpatient Encounter Prescriptions as of 02/16/2014  Medication Sig  . albuterol (PROVENTIL HFA;VENTOLIN HFA) 108 (90 BASE) MCG/ACT inhaler Inhale 2 puffs into the lungs every 6 (six) hours as needed for wheezing or shortness of breath.  . allopurinol (ZYLOPRIM) 100 MG tablet Take 100 mg by mouth 2 (two) times daily.  Marland Kitchen aspirin 81 MG tablet Take 81 mg by mouth daily.  Marland Kitchen atorvastatin (LIPITOR) 20 MG tablet Take 20  mg by mouth daily.  . Calcium Carbonate-Vitamin D (CALTRATE 600+D) 600-400 MG-UNIT per tablet Take 1 tablet by mouth daily.    . cetirizine (ZYRTEC) 10 MG chewable tablet Chew 10 mg by mouth daily.    . cloNIDine (CATAPRES) 0.2 MG tablet Take 1 tablet (0.2 mg total) by mouth 2 (two) times daily.  . ferrous sulfate 325 (65 FE) MG tablet Take 325 mg by mouth daily with breakfast.  . fluticasone (FLONASE) 50 MCG/ACT nasal spray Place 2 sprays into both nostrils daily.  . furosemide (LASIX) 20 MG tablet Take 20 mg by mouth daily as needed.  . gabapentin (NEURONTIN) 600 MG tablet Take 600 mg by mouth at bedtime.  . pantoprazole (PROTONIX) 40 MG tablet Take 40 mg by mouth 2 (two) times daily.    . Probiotic Product (PHILLIPS COLON HEALTH PO) Take 1 capsule by mouth daily.  . traMADol (ULTRAM) 50 MG tablet Take 50 mg by mouth. Every 4-6 hours prn for pain   . vitamin C (ASCORBIC ACID) 500 MG tablet Take 500 mg by mouth daily.    Past Medical History  Diagnosis Date  . HLD (hyperlipidemia)   . HTN (hypertension)   . Cat allergies   . Hay fever     as child  . GERD (gastroesophageal reflux disease)   . Arthritis   . Fatigue   . Iron deficiency anemia   . Chronic renal failure   . Spinal stenosis   . Gout   . H/O Clostridium difficile infection   . Hiatal hernia     Past  Surgical History  Procedure Laterality Date  . Cataract extraction    . Tonsillectomy    . Appendectomy    . Total abdominal hysterectomy    . Knee replacement- both    . Cholecystectomy    . Nose surgery    . Shoulder surgery    . Feet surgery    . Back surgery    . Hernia repair    . Rotator cuff repair      left    Social History  reports that she has never smoked. She has never used smokeless tobacco. She reports that she does not drink alcohol or use illicit drugs.  Family History family history includes Heart failure in her father; Liver cancer in her father; Lung cancer in her brother and sister;  Ovarian cancer in her mother; Prostate cancer in her brother.   Review of Systems  Constitutional: Negative.   Respiratory: Negative.   Cardiovascular:       Minimal leg swelling  Gastrointestinal: Negative.   Musculoskeletal: Negative.   Neurological: Negative.   Hematological: Negative.   Psychiatric/Behavioral: Negative.   All other systems reviewed and are negative.   BP 118/58 mmHg  Pulse 54  Ht 4\' 11"  (1.499 m)  Wt 130 lb 8 oz (59.194 kg)  BMI 26.34 kg/m2   Physical Exam  Constitutional: She is oriented to person, place, and time. She appears well-developed and well-nourished.  HENT:  Head: Normocephalic.  Nose: Nose normal.  Mouth/Throat: Oropharynx is clear and moist.  Eyes: Conjunctivae are normal. Pupils are equal, round, and reactive to light.  Neck: Normal range of motion. Neck supple. No JVD present.  Cardiovascular: Normal rate, regular rhythm, S1 normal, S2 normal, normal heart sounds and intact distal pulses.  Exam reveals no gallop and no friction rub.   No murmur heard. Pulmonary/Chest: Effort normal and breath sounds normal. No respiratory distress. She has no wheezes. She has no rales. She exhibits no tenderness.  Abdominal: Soft. Bowel sounds are normal. She exhibits no distension. There is no tenderness.  Musculoskeletal: Normal range of motion. She exhibits no edema or tenderness.  Lymphadenopathy:    She has no cervical adenopathy.  Neurological: She is alert and oriented to person, place, and time. Coordination normal.  Skin: Skin is warm and dry. No rash noted. No erythema.  Psychiatric: She has a normal mood and affect. Her behavior is normal. Judgment and thought content normal.    Assessment and Plan  Nursing note and vitals reviewed.

## 2014-02-16 NOTE — Assessment & Plan Note (Signed)
Leg edema resolved by holding amlodipine. No longer using lymphedema pump

## 2014-02-16 NOTE — Patient Instructions (Signed)
You are doing well. No medication changes were made.  If you have dizzy spells with standing or severe fatigue, Call the office   Monitor your blood pressure and heart rate, If it runs low, call the office We would cut the clonidine in 1/2 twice a day  Please call us if you have new issues that need to be addressed before your next appt.  Your physician wants you to follow-up in: 6 months.  You will receive a reminder letter in the mail two months in advance. If you don't receive a letter, please call our office to schedule the follow-up appointment.

## 2014-02-16 NOTE — Assessment & Plan Note (Signed)
Mild stenosis in 2011. We will repeat echocardiogram some point in  2016. She is relatively asymptomatic

## 2014-02-16 NOTE — Assessment & Plan Note (Signed)
Blood pressure is well controlled on today's visit. No changes made to the medications. 

## 2014-02-16 NOTE — Assessment & Plan Note (Signed)
She has follow-up with pulmonary for her left lower lobe mass

## 2014-02-26 ENCOUNTER — Ambulatory Visit: Payer: Self-pay | Admitting: Oncology

## 2014-04-07 ENCOUNTER — Ambulatory Visit: Payer: Self-pay | Admitting: Internal Medicine

## 2014-04-07 ENCOUNTER — Other Ambulatory Visit: Payer: Self-pay | Admitting: *Deleted

## 2014-04-07 ENCOUNTER — Telehealth: Payer: Self-pay | Admitting: Internal Medicine

## 2014-04-07 MED ORDER — PREDNISONE (PAK) 10 MG PO TABS: ORAL_TABLET | ORAL | Status: DC

## 2014-04-07 NOTE — Telephone Encounter (Signed)
Spoke with Shawn at CT, states that pt is allergic to iodine and is scheduled today for a ct chest with contrast.  She was supposed to be pre-medicated but she has not been.  CT is going to reschedule the CT chest so the patient can be properly medicated for the contrast.  They are faxing a form documenting all of this to our main office.  Premed prep is a 13-hour prep with prednisone and benadryl.   Forwarding to [facility] to look out for this from CT.

## 2014-04-08 NOTE — Telephone Encounter (Signed)
Spoke with patient and explained to her that Prednisone would be sent to her pharmacy. She is to take the Prednisone 13 hours before procedure at 50 mg, then 7 hrs 50 mg, and 1 hour prior. She is to also take 50 mg of Benadryl 1 hour prior to procedure OTC 50 mg.

## 2014-04-09 ENCOUNTER — Ambulatory Visit: Payer: Self-pay | Admitting: Internal Medicine

## 2014-04-12 ENCOUNTER — Ambulatory Visit: Payer: Medicare Other | Admitting: Internal Medicine

## 2014-04-14 ENCOUNTER — Ambulatory Visit: Payer: Self-pay | Admitting: Oncology

## 2014-04-15 ENCOUNTER — Ambulatory Visit (INDEPENDENT_AMBULATORY_CARE_PROVIDER_SITE_OTHER): Payer: Medicare Other | Admitting: Internal Medicine

## 2014-04-15 ENCOUNTER — Encounter: Payer: Self-pay | Admitting: Internal Medicine

## 2014-04-15 VITALS — BP 152/60 | HR 74 | Temp 98.1°F | Ht 59.0 in | Wt 130.0 lb

## 2014-04-15 DIAGNOSIS — R918 Other nonspecific abnormal finding of lung field: Secondary | ICD-10-CM

## 2014-04-15 DIAGNOSIS — R0602 Shortness of breath: Secondary | ICD-10-CM

## 2014-04-15 DIAGNOSIS — R059 Cough, unspecified: Secondary | ICD-10-CM

## 2014-04-15 DIAGNOSIS — R05 Cough: Secondary | ICD-10-CM

## 2014-04-15 NOTE — Patient Instructions (Signed)
Follow up with Dr. Stevenson Clinch in 1 month 1. Continue with your breathing exercises 2. You have a left lower lobe opacity in your lung - we discussed another bronchoscopy with culture and biopsy.  If wish to have the bronchoscopy with biopsy\bal performed then please call us back to schedule this procedure

## 2014-04-15 NOTE — Assessment & Plan Note (Signed)
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 Continue to monitor, and provide supportive care.

## 2014-04-15 NOTE — Assessment & Plan Note (Signed)
See assessment and plan for pulmonary parenchymal mass

## 2014-04-15 NOTE — Progress Notes (Signed)
MRN# [id number] Joanne Michael [birth year]   CC: Chief Complaint  Patient presents with  . Follow-up    Pt here f/u bronch/ct scan: pt c/o sob still, night sweats, cough has improved but she keeps having to clear her throat yellow to white thick mucus.      Brief History: Synopsis: Patient first presented to [facility] in 10/2013 for evaluation of ongoing cough and sob.  She was treated for pneumonia over a two-month period in the summer of 2015, however had continued shortness of breath, relapses of productive sputum, and cough. This prompted her followup with pulmonary, CT scan was ordered which showed a 2.7 x 2 cm left lower lobe opacity/mass, this was followed by bronchoscopy done by Dr. 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 Ola Spurr (infectious disease) who recommended possible observation since her cough was improving. She was also followed by Dr. Grayland Ormond (hematology/oncology) who recommended an EUS to approach the mass from a different direction; EUS attempted, FNA not performed given the small size and benign appearance of the paratracheal region, no mass lesions were seen.  Events since last clinic visit: Patient presents today for followup of the left lower lobe opacity, cough, sputum production. Today she stated that she started to have mild night sweats, and mild productive sputum at times. No fever, no chills. Now with baseline dyspnea (not worst) since August\July.  She has seen Dr. Ola Spurr (infectious disease), at the time of that visit she was clinically improving and close observation was warranted. Since our last pulmonary visit her AFB sputum showed mycobacterium chelonae, only one positive  culture and sputum, recurrent clinical improvement she does not need IDSA guidelines for rapid growing mycobacteria treatment.  Today she is again accompanied by her grandson.    PMHX:   Past Medical History  Diagnosis Date  . HLD (hyperlipidemia)   . HTN (hypertension)   . Cat allergies   . Hay fever     as child  . GERD (gastroesophageal reflux disease)   . Arthritis   . Fatigue   . Iron deficiency anemia   . Chronic renal failure   . Spinal stenosis   . Gout   . H/O Clostridium difficile infection   . Hiatal hernia    Surgical Hx:  Past Surgical History  Procedure Laterality Date  . Cataract extraction    . Tonsillectomy    . Appendectomy    . Total abdominal hysterectomy    . Knee replacement- both    . Cholecystectomy    . Nose surgery    . Shoulder surgery    . Feet surgery    . Back surgery    . Hernia repair    . Rotator cuff repair      left   Family Hx:  Family History  Problem Relation Age of Onset  . Ovarian cancer Mother   . Liver cancer Father   . Heart failure Father   . Lung cancer Brother   . Prostate cancer Brother   . Lung cancer Sister    Social Hx:   History  Substance Use Topics  . Smoking status: Never Smoker   . Smokeless tobacco: Never Used     Comment: Tobacco use- no   . Alcohol Use:  No   Medication:   Current Outpatient Rx  Name  Route  Sig  Dispense  Refill  . albuterol (PROVENTIL HFA;VENTOLIN HFA) 108 (90 BASE) MCG/ACT inhaler   Inhalation   Inhale 2 puffs into the lungs every 6 (six) hours as needed for wheezing or shortness of breath.         . allopurinol (ZYLOPRIM) 100 MG tablet   Oral   Take 100 mg by mouth 2 (two) times daily.         Marland Kitchen aspirin 81 MG tablet   Oral   Take 81 mg by mouth daily.         Marland Kitchen atorvastatin (LIPITOR) 20 MG tablet   Oral   Take 20 mg by mouth daily.         . Calcium Carbonate-Vitamin D (CALTRATE 600+D) 600-400 MG-UNIT per tablet   Oral   Take 1 tablet by mouth daily.            . cetirizine (ZYRTEC) 10 MG chewable tablet   Oral   Chew 10 mg by mouth daily.           . cloNIDine (CATAPRES) 0.2 MG tablet   Oral   Take 1 tablet (0.2 mg total) by mouth 2 (two) times daily.   90 tablet   3   . ferrous sulfate 325 (65 FE) MG tablet   Oral   Take 325 mg by mouth daily with breakfast.         . fluticasone (FLONASE) 50 MCG/ACT nasal spray   Each Nare   Place 2 sprays into both nostrils daily.         . furosemide (LASIX) 20 MG tablet   Oral   Take 20 mg by mouth daily as needed.         . gabapentin (NEURONTIN) 300 MG capsule   Oral   Take 300 mg by mouth at bedtime.         . gabapentin (NEURONTIN) 600 MG tablet   Oral   Take 600 mg by mouth 3 (three) times daily.          . Multiple Vitamins-Minerals (PRESERVISION AREDS PO)   Oral   Take 1 capsule by mouth daily.         . pantoprazole (PROTONIX) 40 MG tablet   Oral   Take 40 mg by mouth 2 (two) times daily.           . Probiotic Product (PHILLIPS COLON HEALTH PO)   Oral   Take 1 capsule by mouth daily.         . traMADol (ULTRAM) 50 MG tablet   Oral   Take 50 mg by mouth. Every 4-6 hours prn for pain          . vitamin C (ASCORBIC ACID) 500 MG tablet   Oral   Take 500 mg by mouth daily.         . fentaNYL (DURAGESIC - DOSED MCG/HR) 12 MCG/HR   Transdermal   Place 1 patch onto the skin every 3 (three) days.      0   . predniSONE (STERAPRED UNI-PAK) 10 MG tablet      Take 5 tabs at 1am, take 5 tabs at 8 am then, 5 tabs 1 hour prior to procedure. Patient not taking: Reported on 04/15/2014   15 tablet   0      Review of Systems: Gen:  Denies  fever, sweats, chills HEENT: Denies  blurred vision, double vision, ear pain, eye pain, hearing loss, nose bleeds, sore throat Cvc:  No dizziness, chest pain or heaviness Resp:   Mild cough, mild productive sputum, baseline dyspnea Gi: Denies swallowing difficulty, stomach pain, nausea or vomiting, diarrhea,  constipation, bowel incontinence Gu:  Denies bladder incontinence, burning urine Ext:   No Joint pain, stiffness or swelling Skin: No skin rash, easy bruising or bleeding or hives Endoc:  No polyuria, polydipsia , polyphagia or weight change Psych: No depression, insomnia or hallucinations  Other:  All other systems negative  Allergies:  Codeine; Hydrocodone-acetaminophen; Iodine; Meperidine hcl; and Propoxyphene n-acetaminophen  Physical Examination:  VS: BP 152/60 mmHg  Pulse 74  Temp(Src) 98.1 F (36.7 C) (Oral)  Ht 4\' 11"  (1.499 m)  Wt 130 lb (58.968 kg)  BMI 26.24 kg/m2  SpO2 96%  General Appearance: No distress  HEENT: PERRLA, EOM intact, no ptosis, no other lesions noticed Pulmonary:Exam: good respiratory effort, mild crackles at the left base, no wheezes   Cardiovascular:@ Exam:  Normal S1,S2.  No m/r/g.     Abdomen:Exam: Benign, Soft, non-tender, No masses  Skin:   warm, no rashes, no ecchymosis  Extremities: normal, no cyanosis, clubbing, no edema, warm with normal capillary refill.   Labs results:  BMP No results found for: NA, K, CL, CO2, GLUCOSE, BUN, CREATININE   CBC No flowsheet data found.   Rad results: (The following images and results were reviewed by Dr. Stevenson Clinch). CT chest 04/13/2014 FINDINGS: Visualized thyroid is unremarkable. Normal heart size. No pericardial effusion. Aorta and main pulmonary artery normal in caliber. There are scattered calcified atherosclerotic plaque involving the thoracic aorta. No axillary, mediastinal or hilar lymphadenopathy.  There is persistent atelectatic changes within the left lower lobe. Unchanged 7 mm right middle lobe pulmonary nodule. Previously described left lower lobe nodule (2.5 mm) is not well visualized on current evaluation. Minimal scattered ground-glass opacity within the left upper lobe. Stable biapical scarring. No pleural effusion or pneumothorax. Visualization of the upper abdomen demonstrates  postcholecystectomy changes. Spleen is unremarkable. Mild atrophy of the kidneys. Normal adrenal glands. No aggressive or acute appearing osseous lesions.   IMPRESSION: 1. No significant interval change in 7 mm right middle lobe pulmonary nodule. Recommend additional follow-up chest CT in 6 months. 2. No definite left lower lobe mass identified on contrast-enhanced CT. There is persistent atelectatic changes involving the left lower lobe. If not previously performed, recommend correlation with bronchoscopy to exclude the possibility of endobronchial lesion. 3. Minimal scattered ground-glass opacity within the left upper lobe, nonspecific however likely infectious or inflammatory in etiology. Attention on follow-up is recommended.     Assessment and Plan: No problem-specific assessment & plan notes found for this encounter.   Updated Medication List Outpatient Encounter Prescriptions as of 04/15/2014  Medication Sig  . albuterol (PROVENTIL HFA;VENTOLIN HFA) 108 (90 BASE) MCG/ACT inhaler Inhale 2 puffs into the lungs every 6 (six) hours as needed for wheezing or shortness of breath.  . allopurinol (ZYLOPRIM) 100 MG tablet Take 100 mg by mouth 2 (two) times daily.  Marland Kitchen aspirin 81 MG tablet Take 81 mg by mouth daily.  Marland Kitchen atorvastatin (LIPITOR) 20 MG tablet Take 20 mg by mouth daily.  . Calcium Carbonate-Vitamin D (CALTRATE 600+D) 600-400 MG-UNIT per tablet Take 1 tablet by mouth daily.    . cetirizine (ZYRTEC) 10 MG chewable tablet Chew 10 mg by mouth daily.    . cloNIDine (CATAPRES) 0.2 MG tablet Take 1 tablet (0.2 mg total) by  mouth 2 (two) times daily.  . ferrous sulfate 325 (65 FE) MG tablet Take 325 mg by mouth daily with breakfast.  . fluticasone (FLONASE) 50 MCG/ACT nasal spray Place 2 sprays into both nostrils daily.  . furosemide (LASIX) 20 MG tablet Take 20 mg by mouth daily as needed.  . gabapentin (NEURONTIN) 300 MG capsule Take 300 mg by mouth at bedtime.  . gabapentin  (NEURONTIN) 600 MG tablet Take 600 mg by mouth 3 (three) times daily.   . Multiple Vitamins-Minerals (PRESERVISION AREDS PO) Take 1 capsule by mouth daily.  . pantoprazole (PROTONIX) 40 MG tablet Take 40 mg by mouth 2 (two) times daily.    . Probiotic Product (PHILLIPS COLON HEALTH PO) Take 1 capsule by mouth daily.  . traMADol (ULTRAM) 50 MG tablet Take 50 mg by mouth. Every 4-6 hours prn for pain   . vitamin C (ASCORBIC ACID) 500 MG tablet Take 500 mg by mouth daily.  . fentaNYL (DURAGESIC - DOSED MCG/HR) 12 MCG/HR Place 1 patch onto the skin every 3 (three) days.  . predniSONE (STERAPRED UNI-PAK) 10 MG tablet Take 5 tabs at 1am, take 5 tabs at 8 am then, 5 tabs 1 hour prior to procedure. (Patient not taking: Reported on 04/15/2014)    Orders for this visit: No orders of the defined types were placed in this encounter.    Thank  you for the visitation and for allowing  [facility] to assist in the care of your patient. Our recommendations are noted above.  Please contact us if we can be of further service.  Vilinda Boehringer, MD [facility] Office Number: [phone]  - possible rebronch or revisit ID for M. Chelonae treatment

## 2014-04-15 NOTE — Assessment & Plan Note (Addendum)
This mass is still concerning for underlying malignancy,  however given that she has a negative biopsy via FNA and with positive AFB, infection is still a high likelihood (pulmonary abscess is of concern). She's started having back episodes of night sweats, mild increased sputum production but no increased shortness of breath, rapid weight loss. Seen and evaluated by ID (Dr. Ola Spurr) - at the time of that visit she only had one positive sputum AFB for Mycobacterium chelonae, and was not symptomatic, therefore close observation was warranted.  Recent CT did not identify an overt pulmonary mass but does show persistent left lower lobe atelectasis, this is somewhat an improvement from her last CT in September 2015.  Plan: -2 options given to the patient as she is becoming symptomatic again: Another bronchoscopy with BAL and biopsy or referral back to infectious disease to start treatment for rapid growing Mycobacterium. -Currently has symptoms of mild sputum production, baseline shortness of breath, night sweats tolerable. Patient is going to discuss the above options with family and will inform us of her decision. -Followup in one month

## 2014-04-15 NOTE — Assessment & Plan Note (Signed)
Multifactorial: possible reinfection, cough, deconditioning, advanced age. Patient advised to increase activity as tolerated, I believe that she is deconditioned from her months of recovery  and will take some time to recover. Plan as stated for pulmonary parenchymal mass

## 2014-04-20 ENCOUNTER — Ambulatory Visit: Payer: Medicare Other | Admitting: Internal Medicine

## 2014-04-27 ENCOUNTER — Ambulatory Visit
Admit: 2014-04-27 | Disposition: A | Discharge status: Home / Self Care | Payer: Self-pay | Attending: Oncology | Admitting: Oncology

## 2014-05-02 ENCOUNTER — Other Ambulatory Visit: Payer: Self-pay | Admitting: Cardiovascular Disease

## 2014-05-17 ENCOUNTER — Ambulatory Visit: Payer: Medicare Other | Admitting: Internal Medicine

## 2014-05-18 ENCOUNTER — Ambulatory Visit: Payer: Medicare Other | Admitting: Internal Medicine

## 2014-06-11 ENCOUNTER — Other Ambulatory Visit: Payer: Self-pay | Admitting: Oncology

## 2014-06-11 DIAGNOSIS — R222 Localized swelling, mass and lump, trunk: Secondary | ICD-10-CM

## 2014-06-18 NOTE — H&P (Signed)
PATIENT NAME:  Joanne Michael, Joanne Michael MR#:  [id number] DATE OF BIRTH:  [birth year]  DATE OF ADMISSION:  03/07/2012  PRIMARY CARE PHYSICIAN:  Dr. Netty Starring.   REFERRING PHYSICIAN:  Dr. Loletta Specter.   CHIEF COMPLAINT:  Increased shortness of breath, fever, and general malaise.   HISTORY OF PRESENT ILLNESS:  The patient is a very nice 81 year old female who has history of chronic back pain, iron deficiency anemia, hyperlipidemia, hypertension, gastroesophageal reflux and chronic kidney disease with gout.  The patient comes today with a history of increased shortness of breath.  Apparently, the patient was tested and was positive for the flu.  She was treated with Tamiflu, starting to feel a little bit better, but not back to her baseline.  She developed a cough back again this past Monday and for the past couple days has been feeling "lousy," "very sick."  She is starting to cough up some sputum which has been yellow and brown and very thick.  She is starting to shake, but she denies having any chills.  She has vomited twice within the past 24 hours.  She felt her heart palpitating fast and she has increased shortness of breath.  She has not been very active lately, but any time that she tries to get out of the bed, move around, she gets really short of breath.  She says that she cannot eat or drink anything.  She has not been drinking any fluids for the past 24 hours.  She went to visit Dr. Netty Starring yesterday to the office and had a temperature of 101.  She was given Levaquin for possible pneumonia.  Today she comes with worsening of all her symptoms.  She is feeling really tight on her chest and having increase of back pain.   REVIEW OF SYSTEMS:  CONSTITUTIONAL:  Positive fever.  Positive fatigue.  Positive weakness.  No significant weight loss or weight gain.  EYES:  No blurry vision, double vision.  Positive cataracts.  EARS, NOSE, THROAT:  No tinnitus.  No ear pain.  Positive hearing loss.  No nasal  discharge.  No postnasal drip.  No sinus pain.  No difficulty swallowing.  RESPIRATORY:  Positive cough.  Positive for occasional wheezing.  Positive increase in sputum, thick and yellow with brown.  Negative COPD.  The patient states that she has never smoked, but she had at one time the diagnosis on her chart.  She denies it though.  CARDIOVASCULAR:  No chest pain.  No orthopnea.  No arrhythmia.  No syncopal episodes.  No varices.  GASTROINTESTINAL:  Positive nausea.  Positive vomiting.  Negative diarrhea. In the past, she had history of C. diff. Negative abdominal pain.  Negative hematemesis or melena.  GENITOURINARY:  Negative dysuria, hematuria.  Negative changes in frequency.   GYNECOLOGIC:  No vaginal discharge or breast masses.  ENDOCRINOLOGY:  No polyuria, polydipsia, polyphagia.  No thyroid problems, cold or heat intolerance. HEMATOLOGIC AND LYMPHATIC:  No easy bruising.  No bleeding.  No swollen glands.  Positive iron deficiency anemia.  SKIN:  Without any significant rashes.  She does have multiple lesions on the lower extremities due to falls and hitting her legs against surfaces.  MUSCULOSKELETAL:  Positive chronic back pain, neck pain.  Positive gout.  NEUROLOGIC:  No numbness.  No tingling.  No CVAs.  No TIAs.  PSYCHIATRIC:  No significant depression or anxiety.   PAST MEDICAL HISTORY:  1.  Chronic back pain.  2.  Iron deficiency anemia.  3.  Chronic kidney disease.  4.  Gout.  5.  Hyperlipidemia.  6.  Hypertension.  7.  Multiple leg lacerations and prior history of cellulitis.  8.  History of Clostridium difficile infection.  9.  Hearing loss.  10.  Skin cancer removal.  11.  Gastroesophageal reflux.   MEDICATIONS:  Vitamin D3 2000 daily, vitamin B12 1000 mcg once daily, tramadol 50 mg 3 times daily, Protonix 40 mg once daily, Neurontin 100 mg 3 capsules once daily, Lexapro 10 mg once daily, hydrochlorothiazide 12.5 with lisinopril 10 mg once daily, ferrous gluconate 240 mg  once daily, Caltrate plus once daily, aspirin 81 mg daily, allopurinol 100 mg twice daily.   ALLERGIES:  CODEINE, DARVOCET, DEMEROL, IODINE CONTRAST, VICODIN, AND SHELLFISH.   PAST SURGICAL HISTORY:   1.  Appendectomy.  2.  Hysterectomy.  3.  Tonsillectomy.  4.  Sinus surgery.  5.  Bilateral total knee replacement.  6.  Cholecystectomy.  7.  Back surgery.  8.  Bilateral cataract surgery.  9.  Rotator cuff repair.  10.  Excision of multiple skin cancers.   SOCIAL HISTORY:  The patient denies ever smoking.  She had secondhand smoking from her husband.  At some point, she has been told that she had COPD, but apparently Dr. Netty Starring has tested that for her.  She drinks very seldom, occasionally wine but not lately.  She lives apparently with her grandson.  Her family is very involved on her care.   FAMILY HISTORY:  Positive for hypertension in her parents.  No heart attacks.  No cancer.    CODE STATUS: The patient has a LIVING WILL, but the family does not know what it says.  They said that Dr. Netty Starring should have that information.  For now, we can leave her as a FULL CODE.  PHYSICAL EXAMINATION:  VITAL SIGNS:  Blood pressure 119/58, pulse of 98, respiratory 16, temperature 98.4, yesterday temperature at the office was 101, O2 sat is 96% on room air.  GENERAL:  The patient is alert.  She is oriented x 3.  No acute distress.  She looks chronically ill and debilitated.  She looks pale.  HEENT:  Pupils are equal and reactive.  Extraocular movements are intact.  Mucosa are very dry.  There is thrush on her tongue.  No icterus in her eyes.  No oral lesions.  Pale mucosa and conjunctivae.  NECK:  Supple.  No JVD.  No thyromegaly.  No adenopathy.  No rigidity.  No carotid bruits.  CARDIOVASCULAR:  Tachycardic.  Regular rate and rhythm.  Positive systolic ejection murmur 3/6,  which is a known murmur that patient had all her life.  No displacement of PMI.  No tenderness to palpation of the  anterior chest.  No pleurisy.  PULMONARY:  Positive rales on the right lower lobe.  No wheezing.  No rhonchi.  No use of accessory muscles.  No dullness to percussion.  ABDOMEN:  Soft, nontender, nondistended.  No hepatosplenomegaly.  No masses.  Bowel sounds are positive.  GENITAL:  Deferred.  EXTREMITIES:  No significant edema, cyanosis or clubbing.  There are multiple eschars from lacerations and ecchymosis on the lower extremities, which is a chronic finding.  Pulses +2.  Capillary refill less than 3.  MUSCULOSKELETAL:  No significant joint effusions or joint deformity.  NEUROLOGIC:  Cranial nerves II-XII intact.   No focal findings.  PSYCHIATRIC:  Mood is normal without any signs of depression.  The patient is alert, oriented x 3.  LYMPHATICS:  Negative for lymphadenopathy in neck or supraclavicular areas.   LABORATORY AND RADIOLOGICAL DATA:  Chest x-ray, right lower lobe pneumonia.  Glucose is 93, BUN 29, creatinine is 1.88.  Her previous creatinine on August 20th was 1.44.  Her total calcium is 7.  Her magnesium is 101.  Total protein 6.1, albumin is 2.6.  White count is 22,000.  Hemoglobin is 8.2, hematocrit is 25, platelet count is 241.  Influenza test is negative.   ASSESSMENT AND PLAN:  The patient is an 81 year old female with a history of chronic back pain, iron deficiency anemia, chronic kidney disease, hyperlipidemia, hypertension, history of previous Clostridium difficile, diagnosed with the flu on Christmas, treated with Tamiflu, comes with new cough and fever, diagnosed with right lower lobe pneumonia.  1.  Systemic inflammatory response syndrome evidenced by a fever of 101, heart rate above 90 and elevated white blood count of 22,000.  The patient is treated with antibiotics for pneumonia since this is likely the source of her systemic inflammatory response syndrome.  2.  Community-acquired pneumonia.  The patient has been treated with Levaquin for a couple of days, has failed.  The  patient is feeling worse and having more cough.  We are going to switch her to Rocephin and azithromycin.  The patient has an increased cough and secretion.  We are going to put her on neb treatments with albuterol and Atrovent, pulmonary toilet.  The patient is stable at this moment, but she is having potential of getting sicker.  She looks very dehydrated.  She requires IV fluids at this moment.  3.  Dehydration, likely due to her infectious process.  We are going to put her on IV fluids at 75 mL an hour, gentle hydration and monitor her urine output.  4.  Acute kidney injury.  The patient has acute on chronic kidney injury.  Her last creatinine is 1.44, right now is 1.88.  This is likely due to dehydration, poor oral intake and also prerenal, also the infectious process.  Continue IV fluids, gentle hydration.  5.  Hypertension, stable.  6.  Hyperlipidemia, stable.   7.  Gastroesophageal reflux disease, PPI.  8.  Deep vein thrombosis prophylaxis with Lovenox.   TIME SPENT:  About 45 minutes with this patient.    Patient is being signed off to Dr. Sabra Heck who is going to take care of her in the morning for [facility].    CODE STATUS:  The patient is a FULL CODE.  Apparently, she has Advanced Directives but they do not know what they say.  Dr. Netty Starring should have a copy of it.    ____________________________ [place] Sink, MD rsg:ea D: 03/07/2012 23:07:40 ET T: 03/08/2012 00:14:35 ET JOB#: [id number]  cc: [place] Sink, MD, <Dictator> Justinian Miano America Brown MD ELECTRONICALLY SIGNED 03/21/2012 10:58

## 2014-06-18 NOTE — Discharge Summary (Signed)
PATIENT NAME:  Joanne Michael, RENTON MR#:  [id number] DATE OF BIRTH:  18-Jan-1925  DATE OF ADMISSION:  03/07/2012 DATE OF DISCHARGE:  03/12/2012  DISCHARGE DIAGNOSES: 1. Right lower lobe pneumonia consistent with Staphylococcus aureus.  2. Acute on chronic renal failure, resolved.  3. Hypertension.  4. Iron deficiency anemia.   DISCHARGE MEDICATIONS: 1. Lexapro 10 mg p.o. daily.  2. Vitamin D3 2000 international units p.o. daily.  3. Vitamin B12 1000 mcg p.o. daily.  4. Allopurinol 100 mg p.o. 2 times a day. 5. Protonix 40 mg p.o. daily prior to meals.  6. Neurontin 100 mg 3 capsules p.o. at bedtime.  7. Tramadol 50 mg p.o. 3 times daily p.r.n. for pain.  8. Caltrate 600 plus vitamin D 1 tab p.o. daily.  9. Ferrous gluconate 240 mg 1 tab p.o. daily.  10. Aspirin 81 mg p.o. daily.  11. Hydrochlorothiazide/lisinopril 12.5/10 mg 1 tab p.o. daily.  [place] oral capsule 1 tab p.o. daily.  13. Zyrtec 10 mg p.o. daily.  14. Tessalon Perles 200 mg p.o. 3 times daily p.r.n. for cough.  15. Dextromethorphan with guaifenesin 10 mg/100 mg/ 5 mL, 5 mL p.o. every 4 to 6 hours p.r.n. for cough.  16. Levaquin 250 mg p.o. daily x 5 more days.   CONSULTANTS: Nephrology.  PERTINENT LABS ON DISCHARGE: Sodium 142, potassium 3.8 and creatinine 1.17. White blood cell count 10.9, hemoglobin 9.4 and platelets 333.   Sputum culture did grow out staph that was sensitive to Levaquin.   BRIEF HOSPITAL COURSE:  1. Acute respiratory failure. The patient initially came in with acute respiratory failure with flulike illness, but was consistent with right lower lobe pneumonia. Sputum culture did grow out Staph aureus. She was on triple antibiotics for a period of time, vancomycin, azithromycin and ceftriaxone. She was transitioned over to Levaquin when the sensitivities came back. She has progressed very well and back close to her baseline state. We will discharge her on 5 more days of Levaquin.   2. Her other chronic medical issues remained stable. No changes to those regimens.   DISPOSITION: She is in stable condition and will be discharged to home.   DISCHARGE FOLLOWUP: She will follow up with Dr. Netty Starring within 10 days. ____________________________ Dion Body, MD kl:sb D: 03/12/2012 08:20:38 ET T: 03/12/2012 10:23:55 ET JOB#: [id number]  cc: Dion Body, MD, <Dictator> Dion Body MD ELECTRONICALLY SIGNED 03/14/2012 10:42

## 2014-06-18 NOTE — Discharge Summary (Signed)
PATIENT NAME:  Joanne, Michael MR#:  [id number] DATE OF BIRTH:  1924-05-10  ADDENDUM:  DATE OF ADMISSION:  03/07/2012 DATE OF DISCHARGE:  03/12/2012  ADDITIONAL DISCHARGE DIAGNOSES: Systemic inflammatory response syndrome  secondary to pneumonia.     ____________________________ Dion Body, MD kl:ct D: 03/12/2012 11:27:37 ET T: 03/12/2012 12:27:31 ET JOB#: [id number]  cc: Dion Body, MD, <Dictator> Dion Body MD ELECTRONICALLY SIGNED 03/14/2012 10:42

## 2014-06-19 NOTE — Discharge Summary (Signed)
PATIENT NAME:  Joanne Michael, MCEACHERN MR#:  [id number] DATE OF BIRTH:  [birth year]  DATE OF ADMISSION:  09/23/2013 DATE OF DISCHARGE:  09/27/2013  PRIMARY CARE PROVIDER: Dion Body, MD.   DISCHARGE DIAGNOSES:  1.  Community-acquired pneumonia.  2.  Atrial fibrillation with rapid ventricular response.   DISCHARGE MEDICATIONS:  1.  Amoxicillin 875 mg b.i.d.  2.  Lasix 20 mg every other day.  3.  Atorvastatin 20 mg at bedtime.  4.  Aspirin 81 mg daily.  5.  Allopurinol 100 mg 1 tablet twice daily.  6.  Caltrate 600 Plus D 1 tablet daily.  7.  Cetirizine 10 mg daily.  8.  Iron sulfate 325 mg daily.  9.  Neurontin 600 mg daily at bedtime.  10. Protonix 40 mg delayed release 1 tablet twice daily.  11. Vitamin C 1000 mg 1 tablet 1-3 times daily.  12. Clonidine 0.2 mg 1 tablet twice daily.  13. Tramadol 50 mg 1 tablet 1-3 times daily.   HISTORY AND PHYSICAL: This is an 81 year old female with a history of gout and hypertension who presented to her primary care physician's office with chest pain and shortness of breath. She was sent to the ER where a chest x-ray showed a left-sided pneumonia. Troponin was mildly elevated at 0.11. EKG initially showed atrial fibrillation with rapid ventricular response. She was initiated on IV fluids and IV antibiotics. She converted to sinus rhythm.   HOSPITAL COURSE: The patient was admitted and continued on IV antibiotics. Blood cultures had been obtained prior to antibiotics given and these were negative. She was monitored on telemetry and had no further episodes of arrhythmias.  Aspirin and atorvastatin were continued. Allopurinol also was continued for history of gout. Blood pressure was treated with outpatient clonidine and blood pressure remained stable. She remained afebrile throughout her course. Her shortness of breath gradually improved. Physical therapy assisted with getting her ambulating around the ward. They felt she was appropriate for discharge  home. Once her clinical status improved she was able to be discharged home.  She did complete 5 days of oral azithromycin during hospitalization. She will be discharged on an additional 5 days of amoxicillin.   DISCHARGE INSTRUCTIONS:  1.  The patient is to complete course of antibiotics as prescribed.  2.  Patient is to follow up with primary care provider as scheduled, approximately 1 week after discharge.    ____________________________ A. Lavone Orn, MD ams:lt D: 09/27/2013 11:28:54 ET T: 09/27/2013 12:09:31 ET JOB#: [id number]  cc: A. Lavone Orn, MD, <Dictator> Dion Body, MD Sherlon Handing MD ELECTRONICALLY SIGNED 09/28/2013 21:51

## 2014-06-19 NOTE — H&P (Signed)
PATIENT NAME:  Joanne Michael, Joanne Michael MR#:  [id number] DATE OF BIRTH:  [birth year]  DATE OF ADMISSION:  09/23/2013  PRIMARY CARE PHYSICIAN: Dr. Netty Starring.   CHIEF COMPLAINT: Chest pain and shortness of breath.   This is an 81 year old female with a history of gout, hypertension, who presents from her primary care physician's office with chest pain and shortness of breath. In the ER, she is noted  to have a chest x-ray consistent with a left-sided pneumonia, but also a slightly elevated troponin of 0.11. Apparently, she had atrial fibrillation and RVR; however, now currently she is in normal sinus rhythm and her heart rate is 71.   REVIEW OF SYSTEMS: CONSTITUTIONAL:   Positive fever. Positive fatigue. Positive weakness.   EYES: No blurred or double vision.  EARS, NOSE, THROAT: No ear pain, hearing loss. Positive history of seasonal allergies.  RESPIRATORY:  Positive cough, positive rhonchi. No hemoptysis. Positive dyspnea.  CARDIOVASCULAR: Positive chest pain on the left side associated with coughing. No orthopnea, edema, arrhythmia, dyspnea on exertion, palpitations.  GASTROINTESTINAL:  No nausea, vomiting, diarrhea, abdominal pain, melena or ulcers. GENITOURINARY:   No dysuria or hematuria.  ENDOCRINE:  No polyuria or polydipsia.  HEMATOLOGIC AND LYMPHATICS:  Positive anemia and easy bruising.  SKIN:  No rash or lesions. MUSCULOSKELETAL:  No limited activity. NEUROLOGIC:  No history of CVA, TIA.  PSYCHIATRIC: No history of anxiety. Positive depression.  PAST MEDICAL HISTORY:    1.  Hyperlipidemia.  2.  Small airway disease.  3.  Osteoarthritis.  4.  Back pain. 5.  Osteoporosis. 6.  GERD. 7.  Allergic rhinitis.  8.  Hypertension. 9.  Iron deficiency anemia.  10.  Chronic renal failure, stage III.  11.  Varicose veins.  12.  Depression.  13.  Spinal stenosis.  14.  History of C. diff.  15.  History of gout.  MEDICATIONS: 1.  Allopurinol 100 mg daily.  2.  Vitamin C 8000 mg daily.   3.  Atorvastatin 10 mg daily.  4.  Zyrtec 10 mg daily.  5.  Ferrous sulfate 325 mg daily.  6.  Lasix 20 mg daily.  8.  Gabapentin 300 mg daily.  9.  Pantoprazole 40 mg daily. 10.  Promethazine 12.5 daily.  11.  Tramadol 50 mg t.i.d. p.r.n.  12.  Clonidine 0.2 mg b.i.d.   ALLERGIES: CODEINE, DARVOCET, DEMEROL, IODINATED RADIOCONTRAST, VICODIN, FISH, SHELLFISH AND TAPE.    SOCIAL HISTORY: No tobacco, very occasional alcohol use. No IV drug use.   FAMILY HISTORY:   Father is noted for hypertension.  PAST SURGICAL HISTORY:   1.  Cataract surgery.  2.  Left shoulder surgery.  3.  Back surgery. 4.  Cholecystectomy. 5.  Left knee replacement. 6.  Sinus and nose surgery.  7.  Tonsillectomy.  8.  Appendectomy. 9.  Hysterectomy.  PHYSICAL EXAMINATION:  VITAL SIGNS:  Temperature 98.3, pulse of 96, respirations 18, blood pressure 157/48, 98% on  room air.  GENERAL: The patient is alert, oriented, does not appear to be in acute distress.  HEENT: Head is atraumatic. Pupils are round. Sclerae anicteric. Mucous membranes are moist and pink. Oropharynx is clear.   NECK: Supple without JVD, carotid bruit or enlarged thyroid.  CARDIOVASCULAR: Regular rate and rhythm. No murmurs or gallops. PMI is not displaced.   LUNGS: She has got rhonchi on the left side with crackles.  Normal chest expansion. No egophony.  ABDOMEN: Bowel sounds positive. Nontender, nondistended. No hepatosplenomegaly.  EXTREMITIES: No clubbing, cyanosis or  edema, but she is very tender bilaterally in her legs, which is common for her.  NEUROLOGIC: Cranial nerves II through XII are grossly intact. No focal deficits.   LABORATORY DATA:  White blood cells 8.3, hemoglobin 12.3, hematocrit 38. Platelets are 279.  Sodium 140, potassium  3.5, chloride 102, bicarb 29, BUN 23, creatinine 1.57.  Glucose is 90. INR is 0.9. Troponin was 0.11.   Chest x-ray is consistent with mild left basilar opacity concerning for subsegmental  atelectasis or pneumonia.    EKG: Normal sinus rhythm, heart rate of 71. No ST elevation or depression.   ASSESSMENT AND PLAN: An 81 year old female who presents with left-sided chest pain. Initially had atrial fib and rapid ventricular response, which is resolved. Also found to have pneumonia.  1.  Community-acquired pneumonia. The patient will be started on Rocephin and azithromycin. Blood cultures have been ordered and we will monitor her clinically. She is not hypoxic at this time.  2.  Chest pain with elevated troponin. I suspect this elevated troponin is more likely due to demand ischemia from her A. fib/RVR which has now resolved, and how she presented initially to her primary care physician's office also was tachycardia.  She is currently on heparin drip started in the ER. We will continue this for now, following troponins. Further evaluation pending the rest of the troponin and I have started aspirin and will continue atorvastatin.  3.  Chronic kidney disease. Her creatinine seems to be stable at this time.  4.  Atrial fibrillation. This seems to have resolved. We will monitor her on telemetry.  5.  History of gout. We will continue her medications.  6.  History of hypertension. The patient is currently on clonidine 0.2 mg b.i.d., which we will continue.   The patient is DNR.  TIME SPENT: Approximately 50 minutes.    ____________________________ Donell Beers. Benjie Karvonen, MD spm:dmm D: 09/23/2013 19:50:00 ET T: 09/23/2013 20:26:13 ET JOB#: [id number]  cc: Dion Body, MD Louvenia Golomb P. Benjie Karvonen, MD, <Dictator> Donell Beers Miyuki Rzasa MD ELECTRONICALLY SIGNED 09/24/2013 11:34

## 2014-08-08 ENCOUNTER — Encounter: Payer: Self-pay | Admitting: Emergency Medicine

## 2014-08-08 ENCOUNTER — Other Ambulatory Visit: Payer: Self-pay

## 2014-08-08 ENCOUNTER — Emergency Department
Admission: EM | Admit: 2014-08-08 | Discharge: 2014-08-08 | Disposition: A | Discharge status: Home / Self Care | Payer: Medicare Other | Attending: Emergency Medicine | Admitting: Emergency Medicine

## 2014-08-08 DIAGNOSIS — R531 Weakness: Secondary | ICD-10-CM | POA: Insufficient documentation

## 2014-08-08 DIAGNOSIS — Z7951 Long term (current) use of inhaled steroids: Secondary | ICD-10-CM | POA: Diagnosis not present

## 2014-08-08 DIAGNOSIS — R42 Dizziness and giddiness: Secondary | ICD-10-CM | POA: Diagnosis not present

## 2014-08-08 DIAGNOSIS — Z7982 Long term (current) use of aspirin: Secondary | ICD-10-CM | POA: Insufficient documentation

## 2014-08-08 DIAGNOSIS — Z8739 Personal history of other diseases of the musculoskeletal system and connective tissue: Secondary | ICD-10-CM | POA: Insufficient documentation

## 2014-08-08 DIAGNOSIS — R55 Syncope and collapse: Secondary | ICD-10-CM | POA: Diagnosis not present

## 2014-08-08 DIAGNOSIS — Z79899 Other long term (current) drug therapy: Secondary | ICD-10-CM | POA: Diagnosis not present

## 2014-08-08 DIAGNOSIS — I10 Essential (primary) hypertension: Secondary | ICD-10-CM | POA: Insufficient documentation

## 2014-08-08 LAB — URINE DRUG SCREEN, QUALITATIVE (ARMC ONLY)
AMPHETAMINES, UR SCREEN: NOT DETECTED
BARBITURATES, UR SCREEN: NOT DETECTED
Benzodiazepine, Ur Scrn: NOT DETECTED
CANNABINOID 50 NG, UR ~~LOC~~: NOT DETECTED
Cocaine Metabolite,Ur ~~LOC~~: NOT DETECTED
MDMA (Ecstasy)Ur Screen: NOT DETECTED
Methadone Scn, Ur: NOT DETECTED
OPIATE, UR SCREEN: NOT DETECTED
PHENCYCLIDINE (PCP) UR S: NOT DETECTED
TRICYCLIC, UR SCREEN: NOT DETECTED

## 2014-08-08 LAB — URINALYSIS COMPLETE WITH MICROSCOPIC (ARMC ONLY)
BACTERIA UA: NONE SEEN
BILIRUBIN URINE: NEGATIVE
Glucose, UA: NEGATIVE mg/dL
Hgb urine dipstick: NEGATIVE
KETONES UR: NEGATIVE mg/dL
Leukocytes, UA: NEGATIVE
Nitrite: NEGATIVE
Protein, ur: NEGATIVE mg/dL
Specific Gravity, Urine: 1.009 (ref 1.005–1.030)
pH: 5 (ref 5.0–8.0)

## 2014-08-08 LAB — CBC
HCT: 39.1 % (ref 35.0–47.0)
HEMOGLOBIN: 12.8 g/dL (ref 12.0–16.0)
MCH: 29.5 pg (ref 26.0–34.0)
MCHC: 32.7 g/dL (ref 32.0–36.0)
MCV: 90.5 fL (ref 80.0–100.0)
Platelets: 274 10*3/uL (ref 150–440)
RBC: 4.32 MIL/uL (ref 3.80–5.20)
RDW: 13.7 % (ref 11.5–14.5)
WBC: 10.9 10*3/uL (ref 3.6–11.0)

## 2014-08-08 LAB — BASIC METABOLIC PANEL
ANION GAP: 10 (ref 5–15)
BUN: 49 mg/dL — ABNORMAL HIGH (ref 6–20)
CALCIUM: 8.3 mg/dL — AB (ref 8.9–10.3)
CHLORIDE: 108 mmol/L (ref 101–111)
CO2: 22 mmol/L (ref 22–32)
Creatinine, Ser: 2.03 mg/dL — ABNORMAL HIGH (ref 0.44–1.00)
GFR calc Af Amer: 24 mL/min — ABNORMAL LOW (ref >60)
GFR, EST NON AFRICAN AMERICAN: 21 mL/min — AB (ref >60)
GLUCOSE: 117 mg/dL — AB (ref 65–99)
POTASSIUM: 4.3 mmol/L (ref 3.5–5.1)
Sodium: 140 mmol/L (ref 135–145)

## 2014-08-08 LAB — TROPONIN I: Troponin I: 0.03 ng/mL (ref <0.031)

## 2014-08-08 MED ORDER — SODIUM CHLORIDE 0.9 % IV BOLUS (SEPSIS)
500.0000 mL | Freq: Once | INTRAVENOUS | Status: AC
  Administered 2014-08-08: 500 mL via INTRAVENOUS

## 2014-08-08 NOTE — ED Notes (Signed)
Pt arrived via EMS from home. Per pt, was at church and felt weak. Remembers some events prior to episode. Pt is alert and oriented on arrival.

## 2014-08-08 NOTE — Discharge Instructions (Signed)
Your blood tests overall look good today except for the elevation in your kidney function tests.  Follow-up with your regular doctor or with Dr. Rockey Situ and with Dr. Candiss Norse. Return to the emergency department if you have another fainting episode or other urgent concerns.

## 2014-08-08 NOTE — ED Provider Notes (Signed)
[facility] Emergency Department Provider Note  ____________________________________________  Time seen: On arrival with EMS at 1220  I have reviewed the triage vital signs and the nursing notes.   HISTORY  Chief Complaint Near Syncope  syncope    HPI Joanne Michael is a 81 y.o. female who was at church this morning when she had a syncopal episode. She was sitting in the pews and everyone had begun to sing, when she began to feel weak and dizzy. She has poor recollection of the specifics of the moment. She denies chest pain or palpitations. Her daughter and son-in-law were sitting next to her. Once she had passed out they noticed she was leaning over and she was moved to a different area in the church. The daughter reports that she was pale and clammy. Her blood pressure was taken and we are told it was low, though no numbers are provided.  EMS arrived. They did a 12-lead EKG and ran a rhythm strip which shows a sinus arrhythmia but otherwise no worrisome or acute changes. Her glucose was within normal levels. She was awake at this point and transported to the emergency department. The paramedics were saying that she had no complaints. She looked well and denied chest pain, shortness of breath, headache, or other ailments.  She did just recently have an epidural injection due to spinal stenosis. She reports that she did not stand up to sing because of her back pain, however she contradicts this by saying she is not having back pain today or currently. She reports epidural has worked well. She denies any dysfunction of her legs or paresthesias. This was done on Wednesday. The patient says she has not taken any Percocet since Wednesday. EMS was told that the patient often will be impaired friend number of days after having taken a stronger pain medicine.  The daughter and son-in-law arrived. They contradicted the patient's history of not taking any Percocet since  Wednesday. They report she took Percocet yesterday and last night. They report that the patient denies taking Percocet this morning, but are not sure if they're confident in that. They seem quite concerned about her use of medications.    Past Medical History  Diagnosis Date  . HLD (hyperlipidemia)   . HTN (hypertension)   . Cat allergies   . Hay fever     as child  . GERD (gastroesophageal reflux disease)   . Arthritis   . Fatigue   . Iron deficiency anemia   . Chronic renal failure   . Spinal stenosis   . Gout   . H/O Clostridium difficile infection   . Hiatal hernia     Patient Active Problem List   Diagnosis Date Noted  . Pulmonary parenchymal mass 12/02/2013  . Lung mass 12/02/2013  . SOB (shortness of breath) 11/03/2013  . Cough 11/03/2013  . Bilateral leg edema 07/22/2013  . Chronic renal insufficiency 07/22/2013  . Aortic valve stenosis 07/22/2013  . MURMUR 05/26/2009  . Hyperlipidemia 05/02/2009  . ESSENTIAL HYPERTENSION, BENIGN 05/02/2009  . SHORTNESS OF BREATH 05/02/2009  . CHEST PAIN UNSPECIFIED 05/02/2009    Past Surgical History  Procedure Laterality Date  . Cataract extraction    . Tonsillectomy    . Appendectomy    . Total abdominal hysterectomy    . Knee replacement- both    . Cholecystectomy    . Nose surgery    . Shoulder surgery    . Feet surgery    .  Back surgery    . Hernia repair    . Rotator cuff repair      left    Current Outpatient Rx  Name  Route  Sig  Dispense  Refill  . albuterol (PROVENTIL HFA;VENTOLIN HFA) 108 (90 BASE) MCG/ACT inhaler   Inhalation   Inhale 2 puffs into the lungs every 6 (six) hours as needed for wheezing or shortness of breath.         . allopurinol (ZYLOPRIM) 100 MG tablet   Oral   Take 100 mg by mouth 2 (two) times daily.         Marland Kitchen aspirin 81 MG tablet   Oral   Take 81 mg by mouth daily.         Marland Kitchen atorvastatin (LIPITOR) 20 MG tablet   Oral   Take 20 mg by mouth daily.         . Calcium  Carbonate-Vitamin D (CALTRATE 600+D) 600-400 MG-UNIT per tablet   Oral   Take 1 tablet by mouth daily.           . cetirizine (ZYRTEC) 10 MG chewable tablet   Oral   Chew 10 mg by mouth daily.           . cloNIDine (CATAPRES) 0.2 MG tablet   Oral   Take 1 tablet (0.2 mg total) by mouth 2 (two) times daily.   90 tablet   3   . cloNIDine (CATAPRES) 0.2 MG tablet      TAKE 1 TABLET BY MOUTH TWICE A DAY   90 tablet   3   . fentaNYL (DURAGESIC - DOSED MCG/HR) 12 MCG/HR   Transdermal   Place 1 patch onto the skin every 3 (three) days.      0   . ferrous sulfate 325 (65 FE) MG tablet   Oral   Take 325 mg by mouth daily with breakfast.         . fluticasone (FLONASE) 50 MCG/ACT nasal spray   Each Nare   Place 2 sprays into both nostrils daily.         . furosemide (LASIX) 20 MG tablet   Oral   Take 20 mg by mouth daily as needed.         . gabapentin (NEURONTIN) 300 MG capsule   Oral   Take 300 mg by mouth at bedtime.         . gabapentin (NEURONTIN) 600 MG tablet   Oral   Take 600 mg by mouth 3 (three) times daily.          . Multiple Vitamins-Minerals (PRESERVISION AREDS PO)   Oral   Take 1 capsule by mouth daily.         . pantoprazole (PROTONIX) 40 MG tablet   Oral   Take 40 mg by mouth 2 (two) times daily.           . predniSONE (STERAPRED UNI-PAK) 10 MG tablet      Take 5 tabs at 1am, take 5 tabs at 8 am then, 5 tabs 1 hour prior to procedure. Patient not taking: Reported on 04/15/2014   15 tablet   0   . Probiotic Product (PHILLIPS COLON HEALTH PO)   Oral   Take 1 capsule by mouth daily.         . traMADol (ULTRAM) 50 MG tablet   Oral   Take 50 mg by mouth. Every 4-6 hours prn for pain          .  vitamin C (ASCORBIC ACID) 500 MG tablet   Oral   Take 500 mg by mouth daily.           Allergies Codeine; Hydrocodone-acetaminophen; Iodine; Meperidine hcl; and Propoxyphene n-acetaminophen  Family History  Problem Relation  Age of Onset  . Ovarian cancer Mother   . Liver cancer Father   . Heart failure Father   . Lung cancer Brother   . Prostate cancer Brother   . Lung cancer Sister     Social History History  Substance Use Topics  . Smoking status: Never Smoker   . Smokeless tobacco: Never Used     Comment: Tobacco use- no   . Alcohol Use: No    Review of Systems  Constitutional: Negative for fever. ENT: Negative for sore throat. Cardiovascular: Negative for chest pain. Positive for syncope, see history of present illness Respiratory: Negative for shortness of breath. Gastrointestinal: Negative for abdominal pain, vomiting and diarrhea. Genitourinary: Negative for dysuria. Musculoskeletal: History of spinal stenosis, recent epidural, see history of present illness Skin: Negative for rash. Neurological: Negative for headaches, negative for paresthesias or focal weakness in her extremities.   10-point ROS otherwise negative.  ____________________________________________   PHYSICAL EXAM:  VITAL SIGNS: ED Triage Vitals  Enc Vitals Group     BP 08/08/14 1222 120/47 mmHg     Pulse Rate 08/08/14 1222 58     Resp --      Temp --      Temp Source 08/08/14 1222 Oral     SpO2 08/08/14 1222 96 %     Weight 08/08/14 1222 157 lb 6 oz (71.385 kg)     Height 08/08/14 1222 5\' 5"  (1.651 m)     Head Cir --      Peak Flow --      Pain Score --      Pain Loc --      Pain Edu? --      Excl. in [place]? --     Constitutional:  Alert and communicative. She is pleasant and conversant. She is able to recount most details without much difficulty but has limited memory of the event.  ENT   Head: Normocephalic and atraumatic.   Nose: No congestion/rhinnorhea.   Mouth/Throat: Mucous membranes are moist. Cardiovascular: Normal rate, regular rhythm. Respiratory: Normal respiratory effort without tachypnea. Breath sounds are clear and equal bilaterally. No wheezes/rales/rhonchi. Gastrointestinal:  Soft and nontender. No distention.  Back: No muscle spasm, no tenderness, no CVA tenderness. Musculoskeletal: Nontender with normal range of motion in all extremities.  No noted edema. Neurologic:  Normal speech and language. No gross focal neurologic deficits are appreciated.  Equal grip strength bilaterally, 5 over 5 strength in all 4 extremities, normal finger to nose, negative pronator drift. While I have not had the patient stand up, she has no apparent sway while sitting upright in bed with her eyes closed. Skin:  Skin is warm, dry. No rash noted. Psychiatric: Mood and affect are normal. Speech and behavior are normal.  ____________________________________________    LABS (pertinent positives/negatives)  CBC: White blood cell count 10.9, hemoglobin 12.8, platelets [id number] Metabolic panel: Sodium [id number], potassium 4.3, glucose 117, BUN 49, creatinine 2.0  Troponin: Negative Urinalysis: Negative for infection, no ketones, normal specific gravity. Urine drugs: Negative, no opiates noted. ____________________________________________   EKG  ED ECG REPORT I, Cornelius Marullo W, the attending physician, personally viewed and interpreted this ECG.   Date: 08/08/2014  EKG Time: 1225  Rate: 59  Rhythm:  Sinus bradycardia with sinus arrhythmia  Axis: Normal  Intervals: Normal  ST&T Change: None   ____________________________________________    INITIAL IMPRESSION / ASSESSMENT AND PLAN / ED COURSE  Well-appearing 81 year old female with recent epidural. It does not appear to be any complications from the epidural. She reports her back feels well and she is moving well. She has full strength and no paresthesias. He is to today was syncope while at church. Her EKG is reasonable with normal intervals and the patient looks well currently. The daughter contradicts the patient's history regarding pain medication in today's episode could possibly be related to Percocet use last night. She does not  appear to be somnolent or narcotize currently. Her blood tests are overall reasonable except the noted renal dysfunction which is not acute. Given her overall well appearance, we will discharge her home to follow-up with her regular doctor or with her cardiologist.   ----------------------------------------- 4:23 PM on 08/08/2014 -----------------------------------------  Urinalysis has returned and is noted. The patient looks well we will discharge home. ____________________________________________   FINAL CLINICAL IMPRESSION(S) / ED DIAGNOSES  Syncope, first encounter, resolved    Ahmed Prima, MD 08/08/14 1623

## 2014-08-17 ENCOUNTER — Ambulatory Visit (INDEPENDENT_AMBULATORY_CARE_PROVIDER_SITE_OTHER): Payer: Medicare Other | Admitting: Cardiovascular Disease

## 2014-08-17 ENCOUNTER — Encounter: Payer: Self-pay | Admitting: Cardiovascular Disease

## 2014-08-17 VITALS — BP 134/53 | HR 66 | Ht 59.0 in | Wt 127.2 lb

## 2014-08-17 DIAGNOSIS — R55 Syncope and collapse: Secondary | ICD-10-CM | POA: Diagnosis not present

## 2014-08-17 DIAGNOSIS — R079 Chest pain, unspecified: Secondary | ICD-10-CM

## 2014-08-17 DIAGNOSIS — I10 Essential (primary) hypertension: Secondary | ICD-10-CM | POA: Diagnosis not present

## 2014-08-17 DIAGNOSIS — I350 Nonrheumatic aortic (valve) stenosis: Secondary | ICD-10-CM | POA: Diagnosis not present

## 2014-08-17 DIAGNOSIS — N182 Chronic kidney disease, stage 2 (mild): Secondary | ICD-10-CM

## 2014-08-17 NOTE — Patient Instructions (Signed)
You are doing well. Please hold the lasix, take only as needed  We will order an echocardiogram for aortic valve stenosis and near syncope  Please call us if you have new issues that need to be addressed before your next appt.  Your physician wants you to follow-up in: 6 months.  You will receive a reminder letter in the mail two months in advance. If you don't receive a letter, please call our office to schedule the follow-up appointment.

## 2014-08-17 NOTE — Progress Notes (Signed)
Patient ID: Joanne Michael, female    DOB: 07-May-1924, 81 y.o.   MRN: [id number]  HPI Comments: Joanne Michael is a pleasant 81 yo woman with mild aortic valve stenosis in 2011, GERD,  hyperlipidemia, hypertension who presented for lower extremity edema that started in December 2014, with improved symptoms after amlodipine was held She presents today for follow-up of her blood pressure, and episode of near syncope  She reports that on 08/08/2014 she was at church when she developed syncope/near syncope. She was in a sitting position. Reports indicate low blood pressure. She was taken to the emergency room, lab work showed elevated BUN and creatinine consistent with dehydration. She was given fluids with improvement of her symptoms. She denied any recent illnesses. Several days prior she had radiofrequency ablation of her back.  Treatment taking Lasix every other day. Denies any leg edema or abdominal bloating One month ago was started on enalapril for blood pressure Currently she feels well with no complaints  EKG on today's visit shows normal sinus rhythm with rate 59 bpm, no significant ST or T-wave changes  Other past medical history Lab work from primary care shows creatinine 2.0, GFR in the 20s she was previously on lisinopril and HCTZ, pill. This was held and she was changed to amlodipine Laboratory from 07/08/2011 shows creatinine 2.1, GFR 22, BUN 21, potassium 3.3  Prior echocardiogram in 2011 showing normal LV systolic function, diastolic relaxation abnormality, mild aortic valve stenosis with no significant gradient, mild MR, high normal right ventricular systolic pressures   Allergies  Allergen Reactions  . Codeine Nausea And Vomiting  . Hydrocodone-Acetaminophen Other (See Comments)    Hallucinates  . Iodine Swelling and Other (See Comments)    Patient is allergic to seafood and this is the cause why she can't take medication.  . Meperidine Hcl     Unknown reactions.  . Other  Swelling    Patient allergic to seafood, tongue swelling and discoloration.  . Propoxyphene N-Acetaminophen Other (See Comments)    Unknown reaction    Outpatient Encounter Prescriptions as of 08/17/2014  Medication Sig  . albuterol (PROVENTIL HFA;VENTOLIN HFA) 108 (90 BASE) MCG/ACT inhaler Inhale 2 puffs into the lungs every 6 (six) hours as needed for wheezing or shortness of breath.  . allopurinol (ZYLOPRIM) 100 MG tablet Take 100 mg by mouth 2 (two) times daily.  Marland Kitchen ascorbic acid (VITAMIN C) 1000 MG tablet Take 1,000 mg by mouth daily.  Marland Kitchen aspirin EC 81 MG tablet Take 81 mg by mouth daily.  Marland Kitchen atorvastatin (LIPITOR) 20 MG tablet Take 20 mg by mouth daily.  . Calcium Carbonate-Vitamin D (CALTRATE 600+D) 600-400 MG-UNIT per tablet Take 1 tablet by mouth daily.    . cetirizine (ZYRTEC) 10 MG chewable tablet Chew 10 mg by mouth daily.    . cloNIDine (CATAPRES) 0.2 MG tablet Take 1 tablet (0.2 mg total) by mouth 2 (two) times daily.  . enalapril (VASOTEC) 10 MG tablet Take 10 mg by mouth daily.  . ferrous sulfate 325 (65 FE) MG tablet Take 325 mg by mouth daily with breakfast.  . fluticasone (FLONASE) 50 MCG/ACT nasal spray Place 2 sprays into both nostrils at bedtime as needed for allergies or rhinitis.   . furosemide (LASIX) 20 MG tablet Take 20 mg by mouth every other day.   . gabapentin (NEURONTIN) 300 MG capsule Take 300 mg by mouth 2 (two) times daily.   . Multiple Vitamin (MULTIVITAMIN) tablet Take 1 tablet by mouth daily.  Marland Kitchen  Multiple Vitamins-Minerals (PRESERVISION AREDS PO) Take 1 capsule by mouth 2 (two) times daily.   . Probiotic Product (PHILLIPS COLON HEALTH PO) Take 1 capsule by mouth daily.  . traMADol (ULTRAM) 50 MG tablet Take 50 mg by mouth 3 (three) times daily as needed for moderate pain or severe pain. Every 4-6 hours prn for pain  . [DISCONTINUED] cloNIDine (CATAPRES) 0.2 MG tablet TAKE 1 TABLET BY MOUTH TWICE A DAY (Patient not taking: Reported on 08/17/2014)  .  [DISCONTINUED] predniSONE (STERAPRED UNI-PAK) 10 MG tablet Take 5 tabs at 1am, take 5 tabs at 8 am then, 5 tabs 1 hour prior to procedure. (Patient not taking: Reported on 04/15/2014)   No facility-administered encounter medications on file as of 08/17/2014.    Past Medical History  Diagnosis Date  . HLD (hyperlipidemia)   . HTN (hypertension)   . Cat allergies   . Hay fever     as child  . GERD (gastroesophageal reflux disease)   . Arthritis   . Fatigue   . Iron deficiency anemia   . Chronic renal failure   . Spinal stenosis   . Gout   . H/O Clostridium difficile infection   . Hiatal hernia     Past Surgical History  Procedure Laterality Date  . Cataract extraction    . Tonsillectomy    . Appendectomy    . Total abdominal hysterectomy    . Knee replacement- both    . Cholecystectomy    . Nose surgery    . Shoulder surgery    . Feet surgery    . Back surgery    . Hernia repair    . Rotator cuff repair      left    Social History  reports that she has never smoked. She has never used smokeless tobacco. She reports that she does not drink alcohol or use illicit drugs.  Family History family history includes Heart failure in her father; Liver cancer in her father; Lung cancer in her brother and sister; Ovarian cancer in her mother; Prostate cancer in her brother.   Review of Systems  Constitutional: Negative.   Respiratory: Negative.   Cardiovascular:       Minimal leg swelling  Gastrointestinal: Negative.   Musculoskeletal: Negative.   Neurological: Negative.   Hematological: Negative.   Psychiatric/Behavioral: Negative.   All other systems reviewed and are negative.   BP 134/53 mmHg  Pulse 66  Ht 4\' 11"  (1.499 m)  Wt 127 lb 4 oz (57.72 kg)  BMI 25.69 kg/m2   Physical Exam  Constitutional: She is oriented to person, place, and time. She appears well-developed and well-nourished.  HENT:  Head: Normocephalic.  Nose: Nose normal.  Mouth/Throat:  Oropharynx is clear and moist.  Eyes: Conjunctivae are normal. Pupils are equal, round, and reactive to light.  Neck: Normal range of motion. Neck supple. No JVD present.  Cardiovascular: Normal rate, regular rhythm, S1 normal, S2 normal and intact distal pulses.  Exam reveals no gallop and no friction rub.   Murmur heard.  Systolic murmur is present with a grade of 2/6  Pulmonary/Chest: Effort normal and breath sounds normal. No respiratory distress. She has no wheezes. She has no rales. She exhibits no tenderness.  Abdominal: Soft. Bowel sounds are normal. She exhibits no distension. There is no tenderness.  Musculoskeletal: Normal range of motion. She exhibits no edema or tenderness.  Lymphadenopathy:    She has no cervical adenopathy.  Neurological: She is alert and oriented  to person, place, and time. Coordination normal.  Skin: Skin is warm and dry. No rash noted. No erythema.  Psychiatric: She has a normal mood and affect. Her behavior is normal. Judgment and thought content normal.    Assessment and Plan  Nursing note and vitals reviewed.

## 2014-08-17 NOTE — Assessment & Plan Note (Signed)
We have ordered repeat echocardiogram to evaluate aortic valve stenosis. Last echocardiogram 5 years ago

## 2014-08-17 NOTE — Assessment & Plan Note (Signed)
Blood pressure is well controlled on today's visit. No changes made to the medications. 

## 2014-08-17 NOTE — Assessment & Plan Note (Signed)
Recommended she stop Lasix and take this only as needed for ankle edema. Recent episode of dehydration, near syncope at church with creatinine well above her baseline

## 2014-08-25 ENCOUNTER — Ambulatory Visit (INDEPENDENT_AMBULATORY_CARE_PROVIDER_SITE_OTHER): Payer: Medicare Other

## 2014-08-25 DIAGNOSIS — I350 Nonrheumatic aortic (valve) stenosis: Secondary | ICD-10-CM | POA: Diagnosis not present

## 2014-08-25 DIAGNOSIS — R55 Syncope and collapse: Secondary | ICD-10-CM

## 2014-08-25 DIAGNOSIS — R079 Chest pain, unspecified: Secondary | ICD-10-CM | POA: Diagnosis not present

## 2014-09-27 ENCOUNTER — Telehealth: Payer: Self-pay | Admitting: *Deleted

## 2014-09-27 NOTE — Telephone Encounter (Signed)
Dye allergy prep orders faxed to [facility].

## 2014-09-27 NOTE — Telephone Encounter (Signed)
States she is scheduled for CT on the 15th, and she needs meds for the scan. She does have an iodine allergy

## 2014-09-28 ENCOUNTER — Telehealth: Payer: Self-pay | Admitting: *Deleted

## 2014-09-28 NOTE — Telephone Encounter (Signed)
Called to state she now uses [facility],

## 2014-10-08 ENCOUNTER — Other Ambulatory Visit: Payer: Self-pay | Admitting: *Deleted

## 2014-10-08 DIAGNOSIS — R918 Other nonspecific abnormal finding of lung field: Secondary | ICD-10-CM

## 2014-10-11 ENCOUNTER — Other Ambulatory Visit: Payer: Self-pay | Admitting: Oncology

## 2014-10-11 ENCOUNTER — Ambulatory Visit
Admission: RE | Admit: 2014-10-11 | Discharge: 2014-10-11 | Disposition: A | Discharge status: Home / Self Care | Payer: Medicare Other | Source: Ambulatory Visit | Attending: Oncology | Admitting: Oncology

## 2014-10-11 ENCOUNTER — Inpatient Hospital Stay: Payer: Medicare Other | Attending: Oncology

## 2014-10-11 ENCOUNTER — Other Ambulatory Visit: Payer: Self-pay | Admitting: Internal Medicine

## 2014-10-11 DIAGNOSIS — Z801 Family history of malignant neoplasm of trachea, bronchus and lung: Secondary | ICD-10-CM | POA: Diagnosis not present

## 2014-10-11 DIAGNOSIS — M1990 Unspecified osteoarthritis, unspecified site: Secondary | ICD-10-CM | POA: Diagnosis not present

## 2014-10-11 DIAGNOSIS — I10 Essential (primary) hypertension: Secondary | ICD-10-CM | POA: Diagnosis not present

## 2014-10-11 DIAGNOSIS — Z79899 Other long term (current) drug therapy: Secondary | ICD-10-CM | POA: Diagnosis not present

## 2014-10-11 DIAGNOSIS — Z7982 Long term (current) use of aspirin: Secondary | ICD-10-CM | POA: Insufficient documentation

## 2014-10-11 DIAGNOSIS — J9811 Atelectasis: Secondary | ICD-10-CM | POA: Diagnosis not present

## 2014-10-11 DIAGNOSIS — K219 Gastro-esophageal reflux disease without esophagitis: Secondary | ICD-10-CM | POA: Diagnosis not present

## 2014-10-11 DIAGNOSIS — E785 Hyperlipidemia, unspecified: Secondary | ICD-10-CM | POA: Insufficient documentation

## 2014-10-11 DIAGNOSIS — Z8041 Family history of malignant neoplasm of ovary: Secondary | ICD-10-CM | POA: Insufficient documentation

## 2014-10-11 DIAGNOSIS — R911 Solitary pulmonary nodule: Secondary | ICD-10-CM | POA: Diagnosis present

## 2014-10-11 DIAGNOSIS — M4800 Spinal stenosis, site unspecified: Secondary | ICD-10-CM | POA: Insufficient documentation

## 2014-10-11 DIAGNOSIS — R222 Localized swelling, mass and lump, trunk: Secondary | ICD-10-CM

## 2014-10-11 DIAGNOSIS — R918 Other nonspecific abnormal finding of lung field: Secondary | ICD-10-CM

## 2014-10-11 LAB — COMPREHENSIVE METABOLIC PANEL
ALT: 15 U/L (ref 14–54)
AST: 34 U/L (ref 15–41)
Albumin: 4 g/dL (ref 3.5–5.0)
Alkaline Phosphatase: 98 U/L (ref 38–126)
Anion gap: 11 (ref 5–15)
BILIRUBIN TOTAL: 0.6 mg/dL (ref 0.3–1.2)
BUN: 23 mg/dL — ABNORMAL HIGH (ref 6–20)
CALCIUM: 9.9 mg/dL (ref 8.9–10.3)
CHLORIDE: 102 mmol/L (ref 101–111)
CO2: 22 mmol/L (ref 22–32)
CREATININE: 1.53 mg/dL — AB (ref 0.44–1.00)
GFR, EST AFRICAN AMERICAN: 34 mL/min — AB (ref >60)
GFR, EST NON AFRICAN AMERICAN: 29 mL/min — AB (ref >60)
Glucose, Bld: 182 mg/dL — ABNORMAL HIGH (ref 65–99)
Potassium: 4.3 mmol/L (ref 3.5–5.1)
Sodium: 135 mmol/L (ref 135–145)
TOTAL PROTEIN: 7.1 g/dL (ref 6.5–8.1)

## 2014-10-13 ENCOUNTER — Inpatient Hospital Stay (HOSPITAL_BASED_OUTPATIENT_CLINIC_OR_DEPARTMENT_OTHER): Payer: Medicare Other | Admitting: Oncology

## 2014-10-13 VITALS — BP 135/69 | HR 53 | Temp 97.1°F | Resp 16 | Wt 129.0 lb

## 2014-10-13 DIAGNOSIS — Z79899 Other long term (current) drug therapy: Secondary | ICD-10-CM

## 2014-10-13 DIAGNOSIS — R911 Solitary pulmonary nodule: Secondary | ICD-10-CM

## 2014-10-13 DIAGNOSIS — Z7982 Long term (current) use of aspirin: Secondary | ICD-10-CM

## 2014-10-13 DIAGNOSIS — K219 Gastro-esophageal reflux disease without esophagitis: Secondary | ICD-10-CM

## 2014-10-13 DIAGNOSIS — Z8041 Family history of malignant neoplasm of ovary: Secondary | ICD-10-CM

## 2014-10-13 DIAGNOSIS — Z4800 Encounter for change or removal of nonsurgical wound dressing: Secondary | ICD-10-CM

## 2014-10-13 DIAGNOSIS — E785 Hyperlipidemia, unspecified: Secondary | ICD-10-CM | POA: Diagnosis not present

## 2014-10-13 DIAGNOSIS — M1990 Unspecified osteoarthritis, unspecified site: Secondary | ICD-10-CM

## 2014-10-13 DIAGNOSIS — Z801 Family history of malignant neoplasm of trachea, bronchus and lung: Secondary | ICD-10-CM

## 2014-10-13 DIAGNOSIS — I10 Essential (primary) hypertension: Secondary | ICD-10-CM | POA: Diagnosis not present

## 2014-10-15 NOTE — Progress Notes (Signed)
Joanne Michael  [phone]) [phone] [phone]) [phone]  ID: [name] OB: 06/06/1924  MR#: [id number]  [id number]  Patient Care Team: Dion Body, MD as PCP - General (Family Medicine)  CHIEF COMPLAINT:  Chief Complaint  Patient presents with  . Follow-up    left hilar mass/discuss CT results    INTERVAL HISTORY: Patient returns to clinic today for further evaluation and discussion of her imaging results. She continues to feel well and remains asymptomatic. She has no neurologic complaints. She denies any fevers or weight loss. She denies any chest pain, shortness of breath, cough, or hemoptysis. She denies any difficulty swallowing or dysphasia.  She denies any nausea, vomiting, constipation, or diarrhea. She has no urinary complaints. Patient offers no specific complaints today.  REVIEW OF SYSTEMS:   Review of Systems  Constitutional: Negative.   Respiratory: Negative.   Cardiovascular: Negative.     As per HPI. Otherwise, a complete review of systems is negatve.  PAST MEDICAL HISTORY: Past Medical History  Diagnosis Date  . HLD (hyperlipidemia)   . HTN (hypertension)   . Cat allergies   . Hay fever     as child  . GERD (gastroesophageal reflux disease)   . Arthritis   . Fatigue   . Iron deficiency anemia   . Chronic renal failure   . Spinal stenosis   . Gout   . H/O Clostridium difficile infection   . Hiatal hernia     PAST SURGICAL HISTORY: Past Surgical History  Procedure Laterality Date  . Cataract extraction    . Tonsillectomy    . Appendectomy    . Total abdominal hysterectomy    . Knee replacement- both    . Cholecystectomy    . Nose surgery    . Shoulder surgery    . Feet surgery    . Back surgery    . Hernia repair    . Rotator cuff repair      left    FAMILY HISTORY Family History  Problem Relation Age of Onset  . Ovarian cancer Mother   . Liver cancer Father   . Heart failure Father   . Lung  cancer Brother   . Prostate cancer Brother   . Lung cancer Sister        ADVANCED DIRECTIVES:    HEALTH MAINTENANCE: Social History  Substance Use Topics  . Smoking status: Never Smoker   . Smokeless tobacco: Never Used     Comment: Tobacco use- no   . Alcohol Use: No     Colonoscopy:  PAP:  Bone density:  Lipid panel:  Allergies  Allergen Reactions  . Codeine Nausea And Vomiting  . Hydrocodone-Acetaminophen Other (See Comments)    Hallucinates  . Iodine Swelling and Other (See Comments)    Patient is allergic to seafood and this is the cause why she can't take medication.  . Meperidine Hcl     Unknown reactions.  . Other Swelling    Patient allergic to seafood, tongue swelling and discoloration.  . Propoxyphene N-Acetaminophen Other (See Comments)    Unknown reaction    Current Outpatient Prescriptions  Medication Sig Dispense Refill  . albuterol (PROVENTIL HFA;VENTOLIN HFA) 108 (90 BASE) MCG/ACT inhaler Inhale 2 puffs into the lungs every 6 (six) hours as needed for wheezing or shortness of breath.    . allopurinol (ZYLOPRIM) 100 MG tablet Take 100 mg by mouth 2 (two) times daily.    Marland Kitchen ascorbic acid (VITAMIN C) 1000 MG  tablet Take 1,000 mg by mouth daily.    Marland Kitchen aspirin EC 81 MG tablet Take 81 mg by mouth daily.    Marland Kitchen atorvastatin (LIPITOR) 20 MG tablet Take 20 mg by mouth daily.    . Calcium Carbonate-Vitamin D (CALTRATE 600+D) 600-400 MG-UNIT per tablet Take 1 tablet by mouth daily.      . cetirizine (ZYRTEC) 10 MG chewable tablet Chew 10 mg by mouth daily.      . cloNIDine (CATAPRES) 0.2 MG tablet Take 1 tablet (0.2 mg total) by mouth 2 (two) times daily. 90 tablet 3  . ferrous sulfate 325 (65 FE) MG tablet Take 325 mg by mouth daily with breakfast.    . fluticasone (FLONASE) 50 MCG/ACT nasal spray Place 2 sprays into both nostrils at bedtime as needed for allergies or rhinitis.     . furosemide (LASIX) 20 MG tablet Take 20 mg by mouth every other day.     .  gabapentin (NEURONTIN) 300 MG capsule Take 300 mg by mouth 2 (two) times daily.     . Multiple Vitamin (MULTIVITAMIN) tablet Take 1 tablet by mouth daily.    . Probiotic Product (PHILLIPS COLON HEALTH PO) Take 1 capsule by mouth daily.    . traMADol (ULTRAM) 50 MG tablet Take 50 mg by mouth 3 (three) times daily as needed for moderate pain or severe pain. Every 4-6 hours prn for pain     No current facility-administered medications for this visit.    OBJECTIVE: Filed Vitals:   10/13/14 1126  BP: 135/69  Pulse: 53  Temp: 97.1 F (36.2 C)  Resp: 16     Body mass index is 26.03 kg/(m^2).    ECOG FS:0 - Asymptomatic  General: Well-developed, well-nourished, no acute distress. Eyes: Pink conjunctiva, anicteric sclera. Lungs: Clear to auscultation bilaterally. Heart: Regular rate and rhythm. No rubs, murmurs, or gallops. Abdomen: Soft, nontender, nondistended. No organomegaly noted, normoactive bowel sounds. Musculoskeletal: No edema, cyanosis, or clubbing. Neuro: Alert, answering all questions appropriately. Cranial nerves grossly intact. Skin: No rashes or petechiae noted. Psych: Normal affect.   LAB RESULTS:  Lab Results  Component Value Date   NA 135 10/11/2014   K 4.3 10/11/2014   CL 102 10/11/2014   CO2 22 10/11/2014   GLUCOSE 182* 10/11/2014   BUN 23* 10/11/2014   CREATININE 1.53* 10/11/2014   CALCIUM 9.9 10/11/2014   PROT 7.1 10/11/2014   ALBUMIN 4.0 10/11/2014   AST 34 10/11/2014   ALT 15 10/11/2014   ALKPHOS 98 10/11/2014   BILITOT 0.6 10/11/2014   GFRNONAA 29* 10/11/2014   GFRAA 34* 10/11/2014    Lab Results  Component Value Date   WBC 10.9 08/08/2014   NEUTROABS 4.6 11/19/2013   HGB 12.8 08/08/2014   HCT 39.1 08/08/2014   MCV 90.5 08/08/2014   PLT 274 08/08/2014     STUDIES: Ct Chest Wo Contrast  10/11/2014   CLINICAL DATA:  Followup lung mass. Subsequent treatment strategy. Renal insufficiency.  EXAM: CT CHEST WITHOUT CONTRAST  TECHNIQUE:  Multidetector CT imaging of the chest was performed following the standard protocol without IV contrast.  COMPARISON:  CT 04/18/2014  FINDINGS: Mediastinum/Nodes: No axillary or supraclavicular lymphadenopathy. No mediastinal or hilar lymphadenopathy. No fluid.  Lungs/Pleura: No change in 6 mm subpleural nodule in the RIGHT middle lobe (image 33, series 3). There is biapical linear nodular thickening unchanged. Band of atelectasis within the LEFT lower lobe medially along the aorta unchanged.  Upper abdomen: Limited view of the  liver, kidneys, pancreas are unremarkable. Normal adrenal glands.  Musculoskeletal: No aggressive osseous lesion.  IMPRESSION: 1. Stable atelectasis in the LEFT lower lobe. 2. Stable RIGHT middle lobe pulmonary nodule. Recommend follow-up CT in 12 months per Fleischner criteria.   Electronically Signed   By: Suzy Bouchard M.D.   On: 10/11/2014 14:57    ASSESSMENT:  Right pulmonary nodule.  PLAN:    1. Pulmonary nodule: CT scan results reviewed independently and reported as above. Previous, biopsy from bronchoscopy as well as EUS of left hilar mass were inconclusive, plus it is no longer is seen on imaging. No intervention is needed at this time. Repeat CT scan in one year to assess interval change of right pulmonary nodule. If unchanged, patient can be discharged from clinic.  Patient expressed understanding and was in agreement with this plan. She also understands that She can call clinic at any time with any questions, concerns, or complaints.    Lloyd Huger, MD   10/15/2014 4:18 PM

## 2015-01-10 ENCOUNTER — Other Ambulatory Visit: Payer: Self-pay | Admitting: Cardiovascular Disease

## 2015-02-07 DIAGNOSIS — R011 Cardiac murmur, unspecified: Secondary | ICD-10-CM | POA: Insufficient documentation

## 2015-02-14 ENCOUNTER — Ambulatory Visit (INDEPENDENT_AMBULATORY_CARE_PROVIDER_SITE_OTHER): Payer: Medicare Other | Admitting: Cardiovascular Disease

## 2015-02-14 ENCOUNTER — Encounter: Payer: Self-pay | Admitting: Cardiovascular Disease

## 2015-02-14 VITALS — BP 140/60 | HR 71 | Ht 59.0 in | Wt 127.0 lb

## 2015-02-14 DIAGNOSIS — R0602 Shortness of breath: Secondary | ICD-10-CM

## 2015-02-14 DIAGNOSIS — R011 Cardiac murmur, unspecified: Secondary | ICD-10-CM | POA: Diagnosis not present

## 2015-02-14 DIAGNOSIS — N2889 Other specified disorders of kidney and ureter: Secondary | ICD-10-CM

## 2015-02-14 DIAGNOSIS — R05 Cough: Secondary | ICD-10-CM

## 2015-02-14 DIAGNOSIS — I10 Essential (primary) hypertension: Secondary | ICD-10-CM

## 2015-02-14 DIAGNOSIS — I350 Nonrheumatic aortic (valve) stenosis: Secondary | ICD-10-CM

## 2015-02-14 DIAGNOSIS — E785 Hyperlipidemia, unspecified: Secondary | ICD-10-CM

## 2015-02-14 DIAGNOSIS — R059 Cough, unspecified: Secondary | ICD-10-CM

## 2015-02-14 DIAGNOSIS — N182 Chronic kidney disease, stage 2 (mild): Secondary | ICD-10-CM

## 2015-02-14 MED ORDER — FUROSEMIDE 20 MG PO TABS: 20.0000 mg | ORAL_TABLET | Freq: Every day | ORAL | Status: DC | PRN

## 2015-02-14 MED ORDER — ALBUTEROL SULFATE HFA 108 (90 BASE) MCG/ACT IN AERS: 1.0000 | INHALATION_SPRAY | Freq: Four times a day (QID) | RESPIRATORY_TRACT | Status: AC | PRN

## 2015-02-14 NOTE — Assessment & Plan Note (Signed)
Recent cough, nonproductive Unclear if this is from resolving upper respiratory infection, she has completed antibiotics Could be from fluid retention We will treat both as above

## 2015-02-14 NOTE — Patient Instructions (Addendum)
You are doing well.  Please use the albuterol inhaler for shortness of breath as needed  Also try lasix/furosemide as needed for shortness of breath Try three days in a row for breathing (Tuesday, Wednesday and Thursday)  For reflux, Add pepcid or zantac (2 pills at night), generic version, Can be taken twice a day Try the protonix at night  Please call us if you have new issues that need to be addressed before your next appt.  Your physician wants you to follow-up in: 6 months.  You will receive a reminder letter in the mail two months in advance. If you don't receive a letter, please call our office to schedule the follow-up appointment.

## 2015-02-14 NOTE — Assessment & Plan Note (Signed)
Encouraged her to stay on her Lipitor 

## 2015-02-14 NOTE — Assessment & Plan Note (Signed)
Mild aortic valve on echocardiogram. Will monitor periodically with echocardiogram

## 2015-02-14 NOTE — Progress Notes (Signed)
Patient ID: Joanne Michael, female    DOB: [birth year], 81 y.o.   MRN: [id number]  HPI Comments: Joanne Michael is a pleasant 81 yo woman with mild aortic valve stenosis in 2011, GERD,  hyperlipidemia, hypertension who presented for lower extremity edema that started in December 2014, with improved symptoms after amlodipine was held She presents today for follow-up of her blood pressure, Previous episode of  episode of near syncope  In follow-up today, she reports that she has had some abdominal swelling, shortness of breath for several weeks. She drinks significant amounts of water per day, family continues to push her to drink Denies having any leg edema, but does have some soreness to touch Previous leg edema resolved by holding amlodipine Denies having any further episodes of near syncope or syncope She's not taking Lasix  Reports that she recently got over a sinus infection, Now with a mild cough, shortness of breath  EKG on today's visit shows normal sinus rhythm with rate 76 bpm, sinus arrhythmia/APCs  Other past medical history  08/08/2014 she was at church when she developed syncope/near syncope. She was in a sitting position. Reports indicate low blood pressure. She was taken to the emergency room, lab work showed elevated BUN and creatinine consistent with dehydration. She was given fluids with improvement of her symptoms. She denied any recent illnesses. Several days prior she had radiofrequency ablation of her back.   Lab work from primary care shows creatinine 2.0, GFR in the 20s she was previously on lisinopril and HCTZ, pill. This was held and she was changed to amlodipine Laboratory from 07/08/2011 shows creatinine 2.1, GFR 22, BUN 21, potassium 3.3  Prior echocardiogram in 2011 showing normal LV systolic function, diastolic relaxation abnormality, mild aortic valve stenosis with no significant gradient, mild MR, high normal right ventricular systolic pressures   Allergies   Allergen Reactions  . Codeine Nausea And Vomiting  . Hydrocodone-Acetaminophen Other (See Comments)    Hallucinates  . Iodine Swelling and Other (See Comments)    Patient is allergic to seafood and this is the cause why she can't take medication.  . Meperidine Hcl     Unknown reactions.  . Other Swelling    Patient allergic to seafood, tongue swelling and discoloration.  . Propoxyphene N-Acetaminophen Other (See Comments)    Unknown reaction    Outpatient Encounter Prescriptions as of 02/14/2015  Medication Sig  . albuterol (PROVENTIL HFA;VENTOLIN HFA) 108 (90 BASE) MCG/ACT inhaler Inhale 1-2 puffs into the lungs every 6 (six) hours as needed for wheezing or shortness of breath.  . allopurinol (ZYLOPRIM) 100 MG tablet Take 100 mg by mouth 2 (two) times daily.  Marland Kitchen ascorbic acid (VITAMIN C) 1000 MG tablet Take 1,000 mg by mouth daily.  Marland Kitchen aspirin EC 81 MG tablet Take 81 mg by mouth daily.  Marland Kitchen atorvastatin (LIPITOR) 20 MG tablet Take 20 mg by mouth daily.  . Calcium Carbonate-Vitamin D (CALTRATE 600+D) 600-400 MG-UNIT per tablet Take 1 tablet by mouth daily.    . cetirizine (ZYRTEC) 10 MG chewable tablet Chew 10 mg by mouth daily.    . cloNIDine (CATAPRES) 0.2 MG tablet TAKE ONE TABLET BY MOUTH TWICE DAILY  . ferrous sulfate 325 (65 FE) MG tablet Take 325 mg by mouth daily with breakfast.  . fluticasone (FLONASE) 50 MCG/ACT nasal spray Place 2 sprays into both nostrils at bedtime as needed for allergies or rhinitis.   . furosemide (LASIX) 20 MG tablet Take 1 tablet (20 mg  total) by mouth daily as needed.  . gabapentin (NEURONTIN) 300 MG capsule Take 300 mg by mouth 2 (two) times daily.   . Multiple Vitamin (MULTIVITAMIN) tablet Take 1 tablet by mouth daily.  . Probiotic Product (PHILLIPS COLON HEALTH PO) Take 1 capsule by mouth daily.  . traMADol (ULTRAM) 50 MG tablet Take 50 mg by mouth 3 (three) times daily as needed for moderate pain or severe pain. Every 4-6 hours prn for pain  .  [DISCONTINUED] albuterol (PROVENTIL HFA;VENTOLIN HFA) 108 (90 BASE) MCG/ACT inhaler Inhale 2 puffs into the lungs every 6 (six) hours as needed for wheezing or shortness of breath.  . [DISCONTINUED] furosemide (LASIX) 20 MG tablet Take 20 mg by mouth every other day.   . pantoprazole (PROTONIX) 40 MG tablet Take 1 tablet (40 mg total) by mouth daily.   No facility-administered encounter medications on file as of 02/14/2015.    Past Medical History  Diagnosis Date  . HLD (hyperlipidemia)   . HTN (hypertension)   . Cat allergies   . Hay fever     as child  . GERD (gastroesophageal reflux disease)   . Arthritis   . Fatigue   . Iron deficiency anemia   . Chronic renal failure   . Spinal stenosis   . Gout   . H/O Clostridium difficile infection   . Hiatal hernia     Past Surgical History  Procedure Laterality Date  . Cataract extraction    . Tonsillectomy    . Appendectomy    . Total abdominal hysterectomy    . Knee replacement- both    . Cholecystectomy    . Nose surgery    . Shoulder surgery    . Feet surgery    . Back surgery    . Hernia repair    . Rotator cuff repair      left    Social History  reports that she has never smoked. She has never used smokeless tobacco. She reports that she does not drink alcohol or use illicit drugs.  Family History family history includes Heart failure in her father; Liver cancer in her father; Lung cancer in her brother and sister; Ovarian cancer in her mother; Prostate cancer in her brother.   Review of Systems  Constitutional: Negative.   Respiratory: Positive for shortness of breath.   Gastrointestinal: Negative.        Abdominal swelling  Musculoskeletal: Negative.   Neurological: Negative.   Hematological: Negative.   Psychiatric/Behavioral: Negative.   All other systems reviewed and are negative.   BP 140/60 mmHg  Pulse 71  Ht 4\' 11"  (1.499 m)  Wt 127 lb (57.607 kg)  BMI 25.64 kg/m2   Physical Exam   Constitutional: She is oriented to person, place, and time. She appears well-developed and well-nourished.  HENT:  Head: Normocephalic.  Nose: Nose normal.  Mouth/Throat: Oropharynx is clear and moist.  Eyes: Conjunctivae are normal. Pupils are equal, round, and reactive to light.  Neck: Normal range of motion. Neck supple. No JVD present.  Cardiovascular: Normal rate, regular rhythm, S1 normal, S2 normal and intact distal pulses.  Exam reveals no gallop and no friction rub.   Murmur heard.  Systolic murmur is present with a grade of 2/6  Pulmonary/Chest: Effort normal and breath sounds normal. No respiratory distress. She has no wheezes. She has no rales. She exhibits no tenderness.  Abdominal: Soft. Bowel sounds are normal. She exhibits no distension. There is no tenderness.  Musculoskeletal: Normal  range of motion. She exhibits no edema or tenderness.  Lymphadenopathy:    She has no cervical adenopathy.  Neurological: She is alert and oriented to person, place, and time. Coordination normal.  Skin: Skin is warm and dry. No rash noted. No erythema.  Psychiatric: She has a normal mood and affect. Her behavior is normal. Judgment and thought content normal.    Assessment and Plan  Nursing note and vitals reviewed.

## 2015-02-14 NOTE — Assessment & Plan Note (Signed)
Blood pressure is well controlled on today's visit. No changes made to the medications. 

## 2015-02-14 NOTE — Assessment & Plan Note (Signed)
Recent shortness of breath concerning for acute on chronic diastolic CHF Unable to exclude bronchospasm from recent upper respiratory infection/sinusitis She did complete antibiotics We have refilled her albuterol inhaler, discuss this with her that she may have some bronchospasm.  She does have significant fluid intake daily, high likelihood of acute on chronic diastolic CHF Refilled her Lasix Recommended she take this for 3 days in a row and then as needed We will have her call back in one week to let us know if her symptoms are improving

## 2015-02-14 NOTE — Assessment & Plan Note (Signed)
Baseline creatinine 1.7 She is not taking Lasix Suggested she try several doses as needed for shortness of breath, cut back on her fluids

## 2015-04-04 DIAGNOSIS — C439 Malignant melanoma of skin, unspecified: Secondary | ICD-10-CM

## 2015-04-04 HISTORY — DX: Malignant melanoma of skin, unspecified: C43.9

## 2015-04-18 ENCOUNTER — Encounter: Payer: Self-pay | Admitting: Pain Medicine

## 2015-04-18 ENCOUNTER — Other Ambulatory Visit: Payer: Self-pay | Admitting: Pain Medicine

## 2015-04-18 ENCOUNTER — Ambulatory Visit: Payer: Medicare Other | Attending: Pain Medicine | Admitting: Pain Medicine

## 2015-04-18 VITALS — BP 165/39 | HR 101 | Temp 98.0°F | Resp 16 | Ht 62.0 in | Wt 120.0 lb

## 2015-04-18 DIAGNOSIS — Z79891 Long term (current) use of opiate analgesic: Secondary | ICD-10-CM | POA: Diagnosis not present

## 2015-04-18 DIAGNOSIS — M79606 Pain in leg, unspecified: Secondary | ICD-10-CM

## 2015-04-18 DIAGNOSIS — A310 Pulmonary mycobacterial infection: Secondary | ICD-10-CM | POA: Insufficient documentation

## 2015-04-18 DIAGNOSIS — M545 Low back pain: Secondary | ICD-10-CM | POA: Diagnosis not present

## 2015-04-18 DIAGNOSIS — M4800 Spinal stenosis, site unspecified: Secondary | ICD-10-CM | POA: Insufficient documentation

## 2015-04-18 DIAGNOSIS — N184 Chronic kidney disease, stage 4 (severe): Secondary | ICD-10-CM | POA: Insufficient documentation

## 2015-04-18 DIAGNOSIS — Z7189 Other specified counseling: Secondary | ICD-10-CM

## 2015-04-18 DIAGNOSIS — D509 Iron deficiency anemia, unspecified: Secondary | ICD-10-CM | POA: Insufficient documentation

## 2015-04-18 DIAGNOSIS — I10 Essential (primary) hypertension: Secondary | ICD-10-CM | POA: Diagnosis not present

## 2015-04-18 DIAGNOSIS — M109 Gout, unspecified: Secondary | ICD-10-CM | POA: Diagnosis not present

## 2015-04-18 DIAGNOSIS — M810 Age-related osteoporosis without current pathological fracture: Secondary | ICD-10-CM | POA: Insufficient documentation

## 2015-04-18 DIAGNOSIS — N289 Disorder of kidney and ureter, unspecified: Secondary | ICD-10-CM | POA: Insufficient documentation

## 2015-04-18 DIAGNOSIS — M549 Dorsalgia, unspecified: Secondary | ICD-10-CM | POA: Insufficient documentation

## 2015-04-18 DIAGNOSIS — M1990 Unspecified osteoarthritis, unspecified site: Secondary | ICD-10-CM | POA: Insufficient documentation

## 2015-04-18 DIAGNOSIS — K219 Gastro-esophageal reflux disease without esophagitis: Secondary | ICD-10-CM | POA: Diagnosis not present

## 2015-04-18 DIAGNOSIS — G8929 Other chronic pain: Secondary | ICD-10-CM

## 2015-04-18 DIAGNOSIS — M47816 Spondylosis without myelopathy or radiculopathy, lumbar region: Secondary | ICD-10-CM

## 2015-04-18 DIAGNOSIS — E785 Hyperlipidemia, unspecified: Secondary | ICD-10-CM | POA: Diagnosis not present

## 2015-04-18 DIAGNOSIS — Z79899 Other long term (current) drug therapy: Secondary | ICD-10-CM

## 2015-04-18 DIAGNOSIS — E782 Mixed hyperlipidemia: Secondary | ICD-10-CM | POA: Insufficient documentation

## 2015-04-18 DIAGNOSIS — F1190 Opioid use, unspecified, uncomplicated: Secondary | ICD-10-CM

## 2015-04-18 DIAGNOSIS — Z5181 Encounter for therapeutic drug level monitoring: Secondary | ICD-10-CM

## 2015-04-18 DIAGNOSIS — M792 Neuralgia and neuritis, unspecified: Secondary | ICD-10-CM

## 2015-04-18 LAB — COMPREHENSIVE METABOLIC PANEL
ALBUMIN: 4.2 g/dL (ref 3.5–5.0)
ALT: 8 U/L — ABNORMAL LOW (ref 14–54)
AST: 21 U/L (ref 15–41)
Alkaline Phosphatase: 109 U/L (ref 38–126)
Anion gap: 10 (ref 5–15)
BILIRUBIN TOTAL: 0.7 mg/dL (ref 0.3–1.2)
BUN: 64 mg/dL — AB (ref 6–20)
CO2: 22 mmol/L (ref 22–32)
Calcium: 9.9 mg/dL (ref 8.9–10.3)
Chloride: 108 mmol/L (ref 101–111)
Creatinine, Ser: 3.52 mg/dL — ABNORMAL HIGH (ref 0.44–1.00)
GFR calc Af Amer: 12 mL/min — ABNORMAL LOW (ref >60)
GFR calc non Af Amer: 11 mL/min — ABNORMAL LOW (ref >60)
GLUCOSE: 114 mg/dL — AB (ref 65–99)
POTASSIUM: 5.1 mmol/L (ref 3.5–5.1)
Sodium: 140 mmol/L (ref 135–145)
TOTAL PROTEIN: 7.9 g/dL (ref 6.5–8.1)

## 2015-04-18 LAB — MAGNESIUM: Magnesium: 2.1 mg/dL (ref 1.7–2.4)

## 2015-04-18 LAB — C-REACTIVE PROTEIN: CRP: <0.5 mg/dL (ref <1.0)

## 2015-04-18 LAB — SEDIMENTATION RATE: Sed Rate: 63 mm/hr — ABNORMAL HIGH (ref 0–30)

## 2015-04-18 MED ORDER — TRAMADOL HCL 50 MG PO TABS: 50.0000 mg | ORAL_TABLET | Freq: Three times a day (TID) | ORAL | Status: DC | PRN

## 2015-04-18 MED ORDER — GABAPENTIN 300 MG PO CAPS: 300.0000 mg | ORAL_CAPSULE | Freq: Two times a day (BID) | ORAL | Status: DC

## 2015-04-18 NOTE — Progress Notes (Signed)
Patient's Name: Joanne Michael MRN: [id number] DOB: 1924/07/14 DOS: 04/18/2015  Primary Reason(s) for Visit: Encounter for Medication Management CC: Back Pain   HPI  Ms. Loughman is a 82 y.o. year old, female patient, who returns today as an established patient. 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 [facility]); Combined fat and carbohydrate induced hyperlipemia; Anemia, iron deficiency; Heart murmur, systolic; Arthritis urica; Gastro-esophageal reflux disease without esophagitis; Essential (primary) hypertension; Chronic kidney disease, stage IV (severe) ([place]); Back ache; Chronic low back pain (Location of Primary Source of Pain) (Bilateral) (R>L); Lumbar facet syndrome (Location of Primary Source of Pain) (Bilateral) (R>L); Lumbar spondylosis; Chronic pain; Long term current use of opiate analgesic; Long term prescription opiate use; Opiate use; Encounter for therapeutic drug level monitoring; Encounter for chronic pain management; Chronic lower extremity pain (referred) (Location of Secondary source of pain) (Bilateral) (R>L); Neurogenic pain; and Neuropathic pain on her problem list.. Her primarily concern today is the Back Pain   The patient returns to the clinics today for pharmacological management of her chronic pain. The patient indicates that the primary location of her pain is in the lower back with the right side being worst on the left. Her secondary source of pain is the lower extremities with the right being worse than the left. In both instances the pain goes through the back of the leg and does not reach the foot suggesting that it is referred from the lower  back.  Today the patient returns indicating that she would like to have another lumbar facet block repeated.  Reported Pain Score: 8 , clinically she looks like a 4-5/10. Reported level is inconsistent with clinical obrservations. Pain Type: Chronic pain Pain Location: Back Pain Orientation: Lower, Mid, Upper Pain Descriptors / Indicators: Aching, Sharp Pain Frequency: Constant   Controlled Substance Pharmacotherapy Assessment  Analgesic: Tramadol 50 mg 1 tablet by mouth every 8 hours when necessary for pain (150 mg/day) MME/day: 15 mg/day Pharmacokinetics: Onset of action (Liberation/Absorption): Within expected pharmacological parameters Time to Peak effect (Distribution): Timing and results are as within normal expected parameters Duration of action (Metabolism/Excretion): Within normal limits for medication Pharmacodynamics: Analgesic Effect: More than 50% Activity Facilitation: Medication(s) allow patient to sit, stand, walk, and do the basic ADLs Perceived Effectiveness: Described as relatively effective, allowing for increase in activities of daily living (ADL) Side-effects or Adverse reactions: None reported Monitoring: [place] PMP: Compliant with practice rules and regulations UDS Results/interpretation: The patient's last UDS was done on 08/08/2014 and it came back within normal limits with no unexpected results. Medication Assessment Form: Reviewed. Patient indicates being compliant with therapy Treatment compliance: Compliant Risk Assessment: Substance Use Disorder (SUD) Risk Level: Low Opioid Risk Tool (ORT) Score: Total Score: 0  Pharmacologic Plan: Continue therapy as is  Lab Work: Illicit Drugs Lab Results  Component Value Date   THCU NONE DETECTED 08/08/2014   PCPSCRNUR NONE DETECTED 08/08/2014   MDMA NONE DETECTED 08/08/2014   AMPHETMU NONE DETECTED 08/08/2014   METHADONE NONE DETECTED 08/08/2014    Inflammation Markers Lab Results  Component Value Date    ESRSEDRATE 63* 04/18/2015   CRP <0.5 04/18/2015    Renal Function Lab Results  Component Value Date   BUN 64* 04/18/2015   CREATININE 3.52* 04/18/2015   GFRAA 12* 04/18/2015  GFRNONAA 11* 04/18/2015    Hepatic Function Lab Results  Component Value Date   AST 21 04/18/2015   ALT 8* 04/18/2015   ALBUMIN 4.2 04/18/2015    Electrolytes Lab Results  Component Value Date   NA 140 04/18/2015   K 5.1 04/18/2015   CL 108 04/18/2015   CALCIUM 9.9 04/18/2015   MG 2.1 04/18/2015    Allergies  Ms. Bebout is allergic to codeine; hydrocodone-acetaminophen; iodine; meperidine hcl; other; and propoxyphene n-acetaminophen.  Meds  The patient has a current medication list which includes the following prescription(s): albuterol, allopurinol, ascorbic acid, aspirin ec, atorvastatin, benzonatate, calcium carbonate-vitamin d, cetirizine, clonidine, multivitamin, pantoprazole, probiotic product, ferrous sulfate, fluticasone, furosemide, gabapentin, and tramadol.  Current Outpatient Prescriptions on File Prior to Visit  Medication Sig  . albuterol (PROVENTIL HFA;VENTOLIN HFA) 108 (90 BASE) MCG/ACT inhaler Inhale 1-2 puffs into the lungs every 6 (six) hours as needed for wheezing or shortness of breath.  . allopurinol (ZYLOPRIM) 100 MG tablet Take 100 mg by mouth 2 (two) times daily.  Marland Kitchen ascorbic acid (VITAMIN C) 1000 MG tablet Take 1,000 mg by mouth daily.  Marland Kitchen aspirin EC 81 MG tablet Take 81 mg by mouth daily.  Marland Kitchen atorvastatin (LIPITOR) 20 MG tablet Take 20 mg by mouth daily.  . Calcium Carbonate-Vitamin D (CALTRATE 600+D) 600-400 MG-UNIT per tablet Take 1 tablet by mouth daily.    . cetirizine (ZYRTEC) 10 MG chewable tablet Chew 10 mg by mouth daily.    . cloNIDine (CATAPRES) 0.2 MG tablet TAKE ONE TABLET BY MOUTH TWICE DAILY  . Multiple Vitamin (MULTIVITAMIN) tablet Take 1 tablet by mouth daily.  . pantoprazole (PROTONIX) 40 MG tablet Take 1 tablet (40 mg total) by mouth daily.  .  Probiotic Product (PHILLIPS COLON HEALTH PO) Take 1 capsule by mouth daily.  . ferrous sulfate 325 (65 FE) MG tablet Take 325 mg by mouth daily with breakfast.  . fluticasone (FLONASE) 50 MCG/ACT nasal spray Place 2 sprays into both nostrils at bedtime as needed for allergies or rhinitis.   . furosemide (LASIX) 20 MG tablet Take 1 tablet (20 mg total) by mouth daily as needed.   No current facility-administered medications on file prior to visit.    ROS  Constitutional: Afebrile, no chills, well hydrated and well nourished Gastrointestinal: negative Musculoskeletal:negative Neurological: negative Behavioral/Psych: negative  PFSH  Medical:  Ms. Verni  has a past medical history of HLD (hyperlipidemia); HTN (hypertension); Cat allergies; Hay fever; GERD (gastroesophageal reflux disease); Arthritis; Fatigue; Iron deficiency anemia; Chronic renal failure; Spinal stenosis; Gout; H/O Clostridium difficile infection; Hiatal hernia; and Cancer ([place]). Family: family history includes Heart failure in her father; Liver cancer in her father; Lung cancer in her brother and sister; Ovarian cancer in her mother; Prostate cancer in her brother. Surgical:  has past surgical history that includes Cataract extraction; Tonsillectomy; Appendectomy; Total abdominal hysterectomy; knee replacement- both; Cholecystectomy; Nose surgery; Shoulder surgery; feet surgery; Back surgery; Hernia repair; and Rotator cuff repair. Tobacco:  reports that she has never smoked. She has never used smokeless tobacco. Alcohol:  reports that she does not drink alcohol. Drug:  reports that she does not use illicit drugs.  Physical Exam  Vitals:  Today's Vitals   04/18/15 0922 04/18/15 0926  BP:  165/39  Pulse: 101   Temp: 98 F (36.7 C)   Resp: 16   Height: 5\' 2"  (1.575 m)   Weight: 120 lb (54.432 kg)   SpO2: 99%   PainSc: 8  8   PainLoc: Back     Calculated BMI: Body mass index is 21.94 kg/(m^2).  General  appearance: alert, cooperative, appears stated age and mild distress Eyes: PERLA Respiratory: No evidence respiratory distress, no audible rales or ronchi and no use of accessory muscles of respiration  Cervical Spine Inspection: Normal anatomy Alignment: Symetrical ROM: Adequate  Upper Extremities Inspection: No gross anomalies detected ROM: Adequate Sensory: Normal Motor: Unremarkable  Thoracic Spine Inspection: No gross anomalies detected Alignment: Symetrical ROM: Adequate  Lumbar Spine Inspection: No gross anomalies detected Alignment: Symetrical ROM: Decreased  Gait: Antalgic (limping)  Lower Extremities Inspection: No gross anomalies detected ROM: Adequate Sensory:  Normal Motor: Unremarkable  Assessment & Plan  Primary Diagnosis & Pertinent Problem List: The primary encounter diagnosis was Chronic pain. Diagnoses of Chronic low back pain (Location of Primary Source of Pain) (Bilateral) (R>L), Lumbar facet syndrome (Location of Primary Source of Pain) (Bilateral) (R>L), Lumbar spondylosis, unspecified spinal osteoarthritis, Long term current use of opiate analgesic, Long term prescription opiate use, Opiate use, Encounter for therapeutic drug level monitoring, Encounter for chronic pain management, Chronic pain of lower extremity, unspecified laterality, Neurogenic pain, and Neuropathic pain were also pertinent to this visit.  Visit Diagnosis: 1. Chronic pain   2. Chronic low back pain (Location of Primary Source of Pain) (Bilateral) (R>L)   3. Lumbar facet syndrome (Location of Primary Source of Pain) (Bilateral) (R>L)   4. Lumbar spondylosis, unspecified spinal osteoarthritis   5. Long term current use of opiate analgesic   6. Long term prescription opiate use   7. Opiate use   8. Encounter for therapeutic drug level monitoring   9. Encounter for chronic pain management   10. Chronic pain of lower extremity, unspecified laterality   11. Neurogenic pain   12.  Neuropathic pain     Problem-specific Plan(s): No problem-specific assessment & plan notes found for this encounter.   Plan of Care  Pharmacotherapy (Medications Ordered): Meds ordered this encounter  Medications  . traMADol (ULTRAM) 50 MG tablet    Sig: Take 1 tablet (50 mg total) by mouth every 8 (eight) hours as needed for moderate pain or severe pain.    Dispense:  90 tablet    Refill:  2    Do not place this medication, or any other prescription from our practice, on "Automatic Refill". Patient may have prescription filled one day early if pharmacy is closed on scheduled refill date. Do not fill until: 04/18/15 To last until: 07/16/15  . gabapentin (NEURONTIN) 300 MG capsule    Sig: Take 1 capsule (300 mg total) by mouth 2 (two) times daily.    Dispense:  60 capsule    Refill:  2    Do not place this medication, or any other prescription from our practice, on "Automatic Refill". Patient may have prescription filled one day early if pharmacy is closed on scheduled refill date.    Lab-work & Procedure Ordered: Orders Placed This Encounter  Procedures  . LUMBAR FACET(MEDIAL BRANCH NERVE BLOCK) MBNB    Standing Status: Future     Number of Occurrences:      Standing Expiration Date: 04/17/2016    Scheduling Instructions:     Side: Bilateral     Level: L2, L3, L4, L5, & S1 Medial Branch Nerve     Sedation: With Sedation.     Timeframe: ASAA    Order Specific Question:  Where will this procedure be performed?    Answer:  [facility]  . Drugs of abuse screen w/o alc, rtn urine-sln    Volume: 10 ml(s). Minimum 3 ml of urine is needed. Document temperature of fresh sample. Indications: Long term (current) use of opiate analgesic (Z79.891) Test#: [id number] ([employer]ox[employer] Select-13)  . Comprehensive metabolic panel    Order Specific Question:  Has the patient fasted?    Answer:  No  . C-reactive protein  . Magnesium  . Sedimentation rate  . Vitamin B12    Indication:  Bone Pain (M89.9)  . Vitamin D pnl(25-hydrxy+1,25-dihy)-bld    Imaging Ordered: None  Interventional Therapies: Scheduled: Diagnostic, bilateral, lumbar facet block under fluoroscopic guidance and IV sedation. PRN Procedures: Right lumbar epidural steroid injection under fluoroscopic guidance and IV sedation.    Referral(s) or Consult(s): None at this time.  Medications administered during this visit: Ms. Klauck had no medications administered during this visit.  Future Appointments Date Time Provider [place]  07/13/2015 9:00 AM Milinda Pointer, MD [facility] None  10/13/2015 10:00 AM [facility]  10/17/2015 10:00 AM Lloyd Huger, MD [facility] None    Primary Care Physician: Dion Body, MD Location: [facility] Note by: Kathlen Brunswick Dossie Arbour, M.D, DABA, DABAPM, DABPM, DABIPP, FIPP

## 2015-04-18 NOTE — Progress Notes (Signed)
Safety precautions to be maintained throughout the outpatient stay will include: orient to surroundings, keep bed in low position, maintain call bell within reach at all times, provide assistance with transfer out of bed and ambulation.  

## 2015-04-18 NOTE — Patient Instructions (Addendum)
Facet Joint Block The facet joints connect the bones of the spine (vertebrae). They make it possible for you to bend, twist, and make other movements with your spine. They also prevent you from overbending, overtwisting, and making other excessive movements.  A facet joint block is a procedure where a numbing medicine (anesthetic) is injected into a facet joint. Often, a type of anti-inflammatory medicine called a steroid is also injected. A facet joint block may be done for two reasons:   Diagnosis. A facet joint block may be done as a test to see whether neck or back pain is caused by a worn-down or infected facet joint. If the pain gets better after a facet joint block, it means the pain is probably coming from the facet joint. If the pain does not get better, it means the pain is probably not coming from the facet joint.   Therapy. A facet joint block may be done to relieve neck or back pain caused by a facet joint. A facet joint block is only done as a therapy if the pain does not improve with medicine, exercise programs, physical therapy, and other forms of pain management. LET YOUR HEALTH CARE PROVIDER KNOW ABOUT:   Any allergies you have.   All medicines you are taking, including vitamins, herbs, eyedrops, and over-the-counter medicines and creams.   Previous problems you or members of your family have had with the use of anesthetics.   Any blood disorders you have had.   Other health problems you have. RISKS AND COMPLICATIONS Generally, having a facet joint block is safe. However, as with any procedure, complications can occur. Possible complications associated with having a facet joint block include:   Bleeding.   Injury to a nerve near the injection site.   Pain at the injection site.   Weakness or numbness in areas controlled by nerves near the injection site.   Infection.   Temporary fluid retention.   Allergic reaction to anesthetics or medicines used during  the procedure. BEFORE THE PROCEDURE   Follow your health care provider's instructions if you are taking dietary supplements or medicines. You may need to stop taking them or reduce your dosage.   Do not take any new dietary supplements or medicines without asking your health care provider first.   Follow your health care provider's instructions about eating and drinking before the procedure. You may need to stop eating and drinking several hours before the procedure.   Arrange to have an adult drive you home after the procedure. PROCEDURE  You may need to remove your clothing and dress in an open-back gown so that your health care provider can access your spine.   The procedure will be done while you are lying on an X-ray table. Most of the time you will be asked to lie on your stomach, but you may be asked to lie in a different position if an injection will be made in your neck.   Special machines will be used to monitor your oxygen levels, heart rate, and blood pressure.   If an injection will be made in your neck, an intravenous (IV) tube will be inserted into one of your veins. Fluids and medicine will flow directly into your body through the IV tube.   The area over the facet joint where the injection will be made will be cleaned with an antiseptic soap. The surrounding skin will be covered with sterile drapes.   An anesthetic will be applied to your skin   to make the injection area numb. You may feel a temporary stinging or burning sensation.   A video X-ray machine will be used to locate the joint. A contrast dye may be injected into the facet joint area to help with locating the joint.   When the joint is located, an anesthetic medicine will be injected into the joint through the needle.   Your health care provider will ask you whether you feel pain relief. If you do feel relief, a steroid may be injected to provide pain relief for a longer period of time. If you do not  feel relief or feel only partial relief, additional injections of an anesthetic may be made in other facet joints.   The needle will be removed, the skin will be cleansed, and bandages will be applied.  AFTER THE PROCEDURE   You will be observed for 15-30 minutes before being allowed to go home. Do not drive. Have an adult drive you or take a taxi or public transportation instead.   If you feel pain relief, the pain will return in several hours or days when the anesthetic wears off.   You may feel pain relief 2-14 days after the procedure. The amount of time this relief lasts varies from person to person.   It is normal to feel some tenderness over the injected area(s) for 2 days following the procedure.   If you have diabetes, you may have a temporary increase in blood sugar.   This information is not intended to replace advice given to you by your health care provider. Make sure you discuss any questions you have with your health care provider.   Document Released: 07/04/2006 Document Revised: 03/05/2014 Document Reviewed: 12/03/2011 Elsevier Interactive Patient Education 2016 [place]  What are the risk, side effects and possible complications? Generally speaking, most procedures are safe.  However, with any procedure there are risks, side effects, and the possibility of complications.  The risks and complications are dependent upon the sites that are lesioned, or the type of nerve block to be performed.  The closer the procedure is to the spine, the more serious the risks are.  Great care is taken when placing the radio frequency needles, block needles or lesioning probes, but sometimes complications can occur.  Infection: Any time there is an injection through the skin, there is a risk of infection.  This is why sterile conditions are used for these blocks.  There are four possible types of infection.  Localized skin infection.  Central  Nervous System Infection-This can be in the form of Meningitis, which can be deadly.  Epidural Infections-This can be in the form of an epidural abscess, which can cause pressure inside of the spine, causing compression of the spinal cord with subsequent paralysis. This would require an emergency surgery to decompress, and there are no guarantees that the patient would recover from the paralysis.  Discitis-This is an infection of the intervertebral discs.  It occurs in about 1% of discography procedures.  It is difficult to treat and it may lead to surgery.        2. Pain: the needles have to go through skin and soft tissues, will cause soreness.       3. Damage to internal structures:  The nerves to be lesioned may be near blood vessels or    other nerves which can be potentially damaged.       4. Bleeding: Bleeding is more common if  the patient is taking blood thinners such as  aspirin, Coumadin, Ticiid, Plavix, etc., or if he/she have some genetic predisposition  such as hemophilia. Bleeding into the spinal canal can cause compression of the spinal  cord with subsequent paralysis.  This would require an emergency surgery to  decompress and there are no guarantees that the patient would recover from the  paralysis.       5. Pneumothorax:  Puncturing of a lung is a possibility, every time a needle is introduced in  the area of the chest or upper back.  Pneumothorax refers to free air around the  collapsed lung(s), inside of the thoracic cavity (chest cavity).  Another two possible  complications related to a similar event would include: Hemothorax and Chylothorax.   These are variations of the Pneumothorax, where instead of air around the collapsed  lung(s), you may have blood or chyle, respectively.       6. Spinal headaches: They may occur with any procedures in the area of the spine.       7. Persistent CSF (Cerebro-Spinal Fluid) leakage: This is a rare problem, but may occur  with prolonged  intrathecal or epidural catheters either due to the formation of a fistulous  track or a dural tear.       8. Nerve damage: By working so close to the spinal cord, there is always a possibility of  nerve damage, which could be as serious as a permanent spinal cord injury with  paralysis.       9. Death:  Although rare, severe deadly allergic reactions known as "Anaphylactic  reaction" can occur to any of the medications used.      10. Worsening of the symptoms:  We can always make thing worse.  What are the chances of something like this happening? Chances of any of this occuring are extremely low.  By statistics, you have more of a chance of getting killed in a motor vehicle accident: while driving to the hospital than any of the above occurring .  Nevertheless, you should be aware that they are possibilities.  In general, it is similar to taking a shower.  Everybody knows that you can slip, hit your head and get killed.  Does that mean that you should not shower again?  Nevertheless always keep in mind that statistics do not mean anything if you happen to be on the wrong side of them.  Even if a procedure has a 1 (one) in a 1,000,000 (million) chance of going wrong, it you happen to be that one..Also, keep in mind that by statistics, you have more of a chance of having something go wrong when taking medications.  Who should not have this procedure? If you are on a blood thinning medication (e.g. Coumadin, Plavix, see list of "Blood Thinners"), or if you have an active infection going on, you should not have the procedure.  If you are taking any blood thinners, please inform your physician.  How should I prepare for this procedure?  Do not eat or drink anything at least six hours prior to the procedure.  Bring a driver with you .  It cannot be a taxi.  Come accompanied by an adult that can drive you back, and that is strong enough to help you if your legs get weak or numb from the local  anesthetic.  Take all of your medicines the morning of the procedure with just enough water to swallow them.  If you have  diabetes, make sure that you are scheduled to have your procedure done first thing in the morning, whenever possible.  If you have diabetes, take only half of your insulin dose and notify our nurse that you have done so as soon as you arrive at the clinic.  If you are diabetic, but only take blood sugar pills (oral hypoglycemic), then do not take them on the morning of your procedure.  You may take them after you have had the procedure.  Do not take aspirin or any aspirin-containing medications, at least eleven (11) days prior to the procedure.  They may prolong bleeding.  Wear loose fitting clothing that may be easy to take off and that you would not mind if it got stained with Betadine or blood.  Do not wear any jewelry or perfume  Remove any nail coloring.  It will interfere with some of our monitoring equipment.  NOTE: Remember that this is not meant to be interpreted as a complete list of all possible complications.  Unforeseen problems may occur.  BLOOD THINNERS The following drugs contain aspirin or other products, which can cause increased bleeding during surgery and should not be taken for 2 weeks prior to and 1 week after surgery.  If you should need take something for relief of minor pain, you may take acetaminophen which is found in Tylenol,m Datril, Anacin-3 and Panadol. It is not blood thinner. The products listed below are.  Do not take any of the products listed below in addition to any listed on your instruction sheet.  A.P.C or A.P.C with Codeine Codeine Phosphate Capsules #3 Ibuprofen Ridaura  ABC compound Congesprin Imuran rimadil  Advil Cope Indocin Robaxisal  Alka-Seltzer Effervescent Pain Reliever and Antacid Coricidin or Coricidin-D  Indomethacin Rufen  Alka-Seltzer plus Cold Medicine Cosprin Ketoprofen S-A-C Tablets  Anacin Analgesic Tablets  or Capsules Coumadin Korlgesic Salflex  Anacin Extra Strength Analgesic tablets or capsules CP-2 Tablets Lanoril Salicylate  Anaprox Cuprimine Capsules Levenox Salocol  Anexsia-D Dalteparin Magan Salsalate  Anodynos Darvon compound Magnesium Salicylate Sine-off  Ansaid Dasin Capsules Magsal Sodium Salicylate  Anturane Depen Capsules Marnal Soma  APF Arthritis pain formula Dewitt's Pills Measurin Stanback  Argesic Dia-Gesic Meclofenamic Sulfinpyrazone  Arthritis Bayer Timed Release Aspirin Diclofenac Meclomen Sulindac  Arthritis pain formula Anacin Dicumarol Medipren Supac  Analgesic (Safety coated) Arthralgen Diffunasal Mefanamic Suprofen  Arthritis Strength Bufferin Dihydrocodeine Mepro Compound Suprol  Arthropan liquid Dopirydamole Methcarbomol with Aspirin Synalgos  ASA tablets/Enseals Disalcid Micrainin Tagament  Ascriptin Doan's Midol Talwin  Ascriptin A/D Dolene Mobidin Tanderil  Ascriptin Extra Strength Dolobid Moblgesic Ticlid  Ascriptin with Codeine Doloprin or Doloprin with Codeine Momentum Tolectin  Asperbuf Duoprin Mono-gesic Trendar  Aspergum Duradyne Motrin or Motrin IB Triminicin  Aspirin plain, buffered or enteric coated Durasal Myochrisine Trigesic  Aspirin Suppositories Easprin Nalfon Trillsate  Aspirin with Codeine Ecotrin Regular or Extra Strength Naprosyn Uracel  Atromid-S Efficin Naproxen Ursinus  Auranofin Capsules Elmiron Neocylate Vanquish  Axotal Emagrin Norgesic Verin  Azathioprine Empirin or Empirin with Codeine Normiflo Vitamin E  Azolid Emprazil Nuprin Voltaren  Bayer Aspirin plain, buffered or children's or timed BC Tablets or powders Encaprin Orgaran Warfarin Sodium  Buff-a-Comp Enoxaparin Orudis Zorpin  Buff-a-Comp with Codeine Equegesic Os-Cal-Gesic   Buffaprin Excedrin plain, buffered or Extra Strength Oxalid   Bufferin Arthritis Strength Feldene Oxphenbutazone   Bufferin plain or Extra Strength Feldene Capsules Oxycodone with Aspirin   Bufferin  with Codeine Fenoprofen Fenoprofen Pabalate or Pabalate-SF   Buffets II Flogesic Panagesic     Buffinol plain or Extra Strength Florinal or Florinal with Codeine Panwarfarin   Buf-Tabs Flurbiprofen Penicillamine   Butalbital Compound Four-way cold tablets Penicillin   Butazolidin Fragmin Pepto-Bismol   Carbenicillin Geminisyn Percodan   Carna Arthritis Reliever Geopen Persantine   Carprofen Gold's salt Persistin   Chloramphenicol Goody's Phenylbutazone   Chloromycetin Haltrain Piroxlcam   Clmetidine heparin Plaquenil   Cllnoril Hyco-pap Ponstel   Clofibrate Hydroxy chloroquine Propoxyphen         Before stopping any of these medications, be sure to consult the physician who ordered them.  Some, such as Coumadin (Warfarin) are ordered to prevent or treat serious conditions such as "deep thrombosis", "pumonary embolisms", and other heart problems.  The amount of time that you may need off of the medication may also vary with the medication and the reason for which you were taking it.  If you are taking any of these medications, please make sure you notify your pain physician before you undergo any procedures.  Lab work scheduled for today.   Prescription for Tramadol  To last until 07-16-2015. 2 refills.

## 2015-04-22 ENCOUNTER — Other Ambulatory Visit: Payer: Self-pay

## 2015-04-24 LAB — TOXASSURE SELECT 13 (MW), URINE: PDF: 0

## 2015-04-24 NOTE — Progress Notes (Signed)
Quick Note:  Results reviewed. 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 Unexpected results determined to be associated to miscommunication during the recording of medications taken within the last 72 hours. ______

## 2015-04-26 ENCOUNTER — Encounter: Payer: Self-pay | Admitting: Pain Medicine

## 2015-04-26 DIAGNOSIS — N19 Unspecified kidney failure: Secondary | ICD-10-CM | POA: Insufficient documentation

## 2015-04-26 NOTE — Progress Notes (Signed)
Quick Note:  Lab results reviewed and found to be within normal limits. ______ 

## 2015-04-26 NOTE — Progress Notes (Signed)

## 2015-04-26 NOTE — Progress Notes (Signed)
Quick Note:   Normal fasting (NPO x 8 hours) glucose levels are between 65-99 mg/dl, with 2 hour fasting, levels are usually less than 140 mg/dl. Any random blood glucose level greater than 200 mg/dl is considered to be Diabetes.  BUN levels between 7 to 20 mg/dL (2.5 to 7.1 mmol/L) are considered normal. Elevated blood urea nitrogen can also be due to: urinary tract obstruction; congestive heart failure or recent heart attack; gastrointestinal bleeding; dehydration; shock; severe burns; certain medications, such as corticosteroids and some antibiotics; and/or a high protein diet.  Normal Creatinine levels are between 0.5 and 0.9 mg/dl for our lab. Any condition that impairs the function of the kidneys is likely to raise the creatinine level in the blood. The most common causes of longstanding (chronic) kidney disease in adults are high blood pressure and diabetes. Other causes of elevated blood creatinine levels include drugs, ingestion of a large amount of dietary meat, kidney infections, rhabdomyolysis (abnormal muscle breakdown), and urinary tract obstruction.  BUN-to-creatinine ratio >20:1 (BUN dispropertionally higher than the creatinine levels) suggests prerenal azotemia (dehydration or renal hypoperfusion), while <10:1 levels suggest renal damage.  While most low ALT level results indicate a normal healthy liver, that may not always be the case. A low-functioning or non-functioning liver, lacking normal levels of ALT activity to begin with, would not release a lot of ALT into the blood when damaged. People infected with the hepatitis C virus initially show high ALT levels in their blood, but these levels fall over time. Because the ALT test measures ALT levels at only one point in time, people with chronic hepatitis C infection may already have experienced the ALT peak well before blood was drawn for the ALT test. Urinary tract infections or malnutrition may also cause low blood ALT levels. eGFR  (Estimated Glomerular Filtration Rate) results are reported as milliliters/minute/1.73m2 (mL/min/1.73m2). Because some laboratories do not collect information on a patient's race when the sample is collected for testing, they may report calculated results for both African Americans and non-African Americans.  The National Kid[employer] (NKF) suggests only reporting actual results once values are < 60 mL/min. 1. Normal values: 90-120 mL/min 2. Below 60 mL/min suggests that some kidney damage has occurred. 3. Between 59 and 30 indicate (Moderate) Stage 3 kidney disease. 4. Between 29 and 15 represent (Severe) Stage 4 kidney disease. 5. Less than 15 is considered (Kidney Failure) Stage 5. ______ 

## 2015-05-22 ENCOUNTER — Emergency Department: Payer: Medicare Other

## 2015-05-22 ENCOUNTER — Encounter: Payer: Self-pay | Admitting: Emergency Medicine

## 2015-05-22 ENCOUNTER — Inpatient Hospital Stay
Admission: EM | Admit: 2015-05-22 | Discharge: 2015-05-25 | DRG: 682 | Disposition: A | Discharge status: Home Health | Payer: Medicare Other | Attending: Internal Medicine | Admitting: Internal Medicine

## 2015-05-22 DIAGNOSIS — N179 Acute kidney failure, unspecified: Principal | ICD-10-CM | POA: Diagnosis present

## 2015-05-22 DIAGNOSIS — Z79899 Other long term (current) drug therapy: Secondary | ICD-10-CM

## 2015-05-22 DIAGNOSIS — M109 Gout, unspecified: Secondary | ICD-10-CM | POA: Diagnosis present

## 2015-05-22 DIAGNOSIS — M1990 Unspecified osteoarthritis, unspecified site: Secondary | ICD-10-CM | POA: Diagnosis present

## 2015-05-22 DIAGNOSIS — Z9049 Acquired absence of other specified parts of digestive tract: Secondary | ICD-10-CM

## 2015-05-22 DIAGNOSIS — E872 Acidosis: Secondary | ICD-10-CM | POA: Diagnosis present

## 2015-05-22 DIAGNOSIS — R0602 Shortness of breath: Secondary | ICD-10-CM

## 2015-05-22 DIAGNOSIS — E785 Hyperlipidemia, unspecified: Secondary | ICD-10-CM | POA: Diagnosis present

## 2015-05-22 DIAGNOSIS — Z85820 Personal history of malignant melanoma of skin: Secondary | ICD-10-CM | POA: Diagnosis not present

## 2015-05-22 DIAGNOSIS — N184 Chronic kidney disease, stage 4 (severe): Secondary | ICD-10-CM | POA: Diagnosis present

## 2015-05-22 DIAGNOSIS — N390 Urinary tract infection, site not specified: Secondary | ICD-10-CM | POA: Diagnosis present

## 2015-05-22 DIAGNOSIS — K219 Gastro-esophageal reflux disease without esophagitis: Secondary | ICD-10-CM | POA: Diagnosis present

## 2015-05-22 DIAGNOSIS — B9620 Unspecified Escherichia coli [E. coli] as the cause of diseases classified elsewhere: Secondary | ICD-10-CM | POA: Diagnosis present

## 2015-05-22 DIAGNOSIS — Z7951 Long term (current) use of inhaled steroids: Secondary | ICD-10-CM

## 2015-05-22 DIAGNOSIS — I129 Hypertensive chronic kidney disease with stage 1 through stage 4 chronic kidney disease, or unspecified chronic kidney disease: Secondary | ICD-10-CM | POA: Diagnosis present

## 2015-05-22 DIAGNOSIS — N3090 Cystitis, unspecified without hematuria: Secondary | ICD-10-CM

## 2015-05-22 DIAGNOSIS — I959 Hypotension, unspecified: Secondary | ICD-10-CM | POA: Diagnosis not present

## 2015-05-22 DIAGNOSIS — F0390 Unspecified dementia without behavioral disturbance: Secondary | ICD-10-CM | POA: Diagnosis present

## 2015-05-22 DIAGNOSIS — R4182 Altered mental status, unspecified: Secondary | ICD-10-CM

## 2015-05-22 DIAGNOSIS — D509 Iron deficiency anemia, unspecified: Secondary | ICD-10-CM | POA: Diagnosis present

## 2015-05-22 DIAGNOSIS — E875 Hyperkalemia: Secondary | ICD-10-CM

## 2015-05-22 DIAGNOSIS — N189 Chronic kidney disease, unspecified: Secondary | ICD-10-CM

## 2015-05-22 DIAGNOSIS — Z96653 Presence of artificial knee joint, bilateral: Secondary | ICD-10-CM | POA: Diagnosis present

## 2015-05-22 DIAGNOSIS — G9340 Encephalopathy, unspecified: Secondary | ICD-10-CM | POA: Diagnosis present

## 2015-05-22 DIAGNOSIS — J019 Acute sinusitis, unspecified: Secondary | ICD-10-CM | POA: Diagnosis present

## 2015-05-22 DIAGNOSIS — D631 Anemia in chronic kidney disease: Secondary | ICD-10-CM | POA: Diagnosis present

## 2015-05-22 DIAGNOSIS — E860 Dehydration: Secondary | ICD-10-CM | POA: Diagnosis present

## 2015-05-22 DIAGNOSIS — Z7982 Long term (current) use of aspirin: Secondary | ICD-10-CM

## 2015-05-22 LAB — CBC WITH DIFFERENTIAL/PLATELET
Basophils Absolute: 0 10*3/uL (ref 0–0.1)
Basophils Relative: 0 %
EOS ABS: 0.1 10*3/uL (ref 0–0.7)
EOS PCT: 2 %
HCT: 29.5 % — ABNORMAL LOW (ref 35.0–47.0)
Hemoglobin: 9.8 g/dL — ABNORMAL LOW (ref 12.0–16.0)
LYMPHS ABS: 1.8 10*3/uL (ref 1.0–3.6)
LYMPHS PCT: 22 %
MCH: 30.7 pg (ref 26.0–34.0)
MCHC: 33.1 g/dL (ref 32.0–36.0)
MCV: 92.7 fL (ref 80.0–100.0)
MONO ABS: 0.5 10*3/uL (ref 0.2–0.9)
MONOS PCT: 6 %
Neutro Abs: 5.9 10*3/uL (ref 1.4–6.5)
Neutrophils Relative %: 70 %
PLATELETS: 304 10*3/uL (ref 150–440)
RBC: 3.18 MIL/uL — AB (ref 3.80–5.20)
RDW: 14.8 % — AB (ref 11.5–14.5)
WBC: 8.4 10*3/uL (ref 3.6–11.0)

## 2015-05-22 LAB — COMPREHENSIVE METABOLIC PANEL
ALBUMIN: 3.7 g/dL (ref 3.5–5.0)
ALK PHOS: 93 U/L (ref 38–126)
ALT: 7 U/L — AB (ref 14–54)
AST: 18 U/L (ref 15–41)
Anion gap: 8 (ref 5–15)
BILIRUBIN TOTAL: 0.7 mg/dL (ref 0.3–1.2)
BUN: 51 mg/dL — AB (ref 6–20)
CALCIUM: 9.3 mg/dL (ref 8.9–10.3)
CO2: 23 mmol/L (ref 22–32)
CREATININE: 4.44 mg/dL — AB (ref 0.44–1.00)
Chloride: 99 mmol/L — ABNORMAL LOW (ref 101–111)
GFR calc Af Amer: 9 mL/min — ABNORMAL LOW (ref >60)
GFR calc non Af Amer: 8 mL/min — ABNORMAL LOW (ref >60)
GLUCOSE: 110 mg/dL — AB (ref 65–99)
Potassium: 5.9 mmol/L — ABNORMAL HIGH (ref 3.5–5.1)
Sodium: 130 mmol/L — ABNORMAL LOW (ref 135–145)
TOTAL PROTEIN: 6.6 g/dL (ref 6.5–8.1)

## 2015-05-22 LAB — LACTIC ACID, PLASMA
LACTIC ACID, VENOUS: 1.4 mmol/L (ref 0.5–2.0)
Lactic Acid, Venous: 1.4 mmol/L (ref 0.5–2.0)

## 2015-05-22 MED ORDER — DEXTROSE 5 % IV SOLN
1.0000 g | Freq: Once | INTRAVENOUS | Status: AC
  Administered 2015-05-22: 1 g via INTRAVENOUS
  Filled 2015-05-22: qty 10

## 2015-05-22 MED ORDER — ALBUTEROL SULFATE (2.5 MG/3ML) 0.083% IN NEBU: 2.5000 mg | INHALATION_SOLUTION | Freq: Four times a day (QID) | RESPIRATORY_TRACT | Status: DC | PRN

## 2015-05-22 MED ORDER — CLONIDINE HCL 0.1 MG PO TABS
0.2000 mg | ORAL_TABLET | Freq: Two times a day (BID) | ORAL | Status: DC
  Administered 2015-05-22 – 2015-05-23 (×2): 0.2 mg via ORAL
  Filled 2015-05-22 (×2): qty 2

## 2015-05-22 MED ORDER — SODIUM CHLORIDE 0.9% FLUSH
3.0000 mL | Freq: Two times a day (BID) | INTRAVENOUS | Status: DC
  Administered 2015-05-22 – 2015-05-23 (×2): 3 mL via INTRAVENOUS

## 2015-05-22 MED ORDER — ALBUTEROL SULFATE HFA 108 (90 BASE) MCG/ACT IN AERS: 1.0000 | INHALATION_SPRAY | Freq: Four times a day (QID) | RESPIRATORY_TRACT | Status: DC | PRN

## 2015-05-22 MED ORDER — ATORVASTATIN CALCIUM 20 MG PO TABS
20.0000 mg | ORAL_TABLET | Freq: Every day | ORAL | Status: DC
  Administered 2015-05-22 – 2015-05-25 (×4): 20 mg via ORAL
  Filled 2015-05-22 (×4): qty 1

## 2015-05-22 MED ORDER — RISAQUAD PO CAPS
2.0000 | ORAL_CAPSULE | Freq: Every day | ORAL | Status: DC
  Administered 2015-05-23 – 2015-05-25 (×3): 2 via ORAL
  Filled 2015-05-22 (×3): qty 2

## 2015-05-22 MED ORDER — DEXTROSE 5 % IV SOLN
1.0000 g | INTRAVENOUS | Status: DC
  Administered 2015-05-23 – 2015-05-24 (×2): 1 g via INTRAVENOUS
  Filled 2015-05-22 (×3): qty 10

## 2015-05-22 MED ORDER — ALLOPURINOL 100 MG PO TABS
100.0000 mg | ORAL_TABLET | Freq: Two times a day (BID) | ORAL | Status: DC
  Administered 2015-05-22 – 2015-05-25 (×6): 100 mg via ORAL
  Filled 2015-05-22 (×6): qty 1

## 2015-05-22 MED ORDER — GABAPENTIN 300 MG PO CAPS
300.0000 mg | ORAL_CAPSULE | Freq: Two times a day (BID) | ORAL | Status: DC
  Administered 2015-05-22 – 2015-05-25 (×6): 300 mg via ORAL
  Filled 2015-05-22 (×6): qty 1

## 2015-05-22 MED ORDER — ADULT MULTIVITAMIN W/MINERALS CH
1.0000 | ORAL_TABLET | Freq: Every day | ORAL | Status: DC
  Administered 2015-05-23 – 2015-05-25 (×3): 1 via ORAL
  Filled 2015-05-22 (×4): qty 1

## 2015-05-22 MED ORDER — SODIUM CHLORIDE 0.9 % IV SOLN
INTRAVENOUS | Status: DC
  Administered 2015-05-22 – 2015-05-23 (×3): via INTRAVENOUS

## 2015-05-22 MED ORDER — HEPARIN SODIUM (PORCINE) 5000 UNIT/ML IJ SOLN
5000.0000 [IU] | Freq: Three times a day (TID) | INTRAMUSCULAR | Status: DC
  Administered 2015-05-22 – 2015-05-25 (×9): 5000 [IU] via SUBCUTANEOUS
  Filled 2015-05-22 (×9): qty 1

## 2015-05-22 MED ORDER — FLUTICASONE PROPIONATE 50 MCG/ACT NA SUSP
2.0000 | Freq: Every evening | NASAL | Status: DC | PRN
  Filled 2015-05-22: qty 16

## 2015-05-22 MED ORDER — DEXTROSE 50 % IV SOLN
25.0000 g | Freq: Once | INTRAVENOUS | Status: AC
  Administered 2015-05-22: 25 g via INTRAVENOUS
  Filled 2015-05-22: qty 50

## 2015-05-22 MED ORDER — SODIUM BICARBONATE 8.4 % IV SOLN
50.0000 meq | Freq: Once | INTRAVENOUS | Status: AC
  Administered 2015-05-22: 50 meq via INTRAVENOUS
  Filled 2015-05-22: qty 50

## 2015-05-22 MED ORDER — FERROUS SULFATE 325 (65 FE) MG PO TABS
325.0000 mg | ORAL_TABLET | Freq: Every day | ORAL | Status: DC
  Administered 2015-05-23 – 2015-05-25 (×3): 325 mg via ORAL
  Filled 2015-05-22 (×3): qty 1

## 2015-05-22 MED ORDER — VITAMIN C 500 MG PO TABS
1000.0000 mg | ORAL_TABLET | Freq: Every day | ORAL | Status: DC
  Administered 2015-05-23 – 2015-05-25 (×3): 1000 mg via ORAL
  Filled 2015-05-22 (×3): qty 2

## 2015-05-22 MED ORDER — DEXTROSE 5 % IV SOLN: 1.0000 g | INTRAVENOUS | Status: DC

## 2015-05-22 MED ORDER — LORATADINE 10 MG PO TABS
10.0000 mg | ORAL_TABLET | Freq: Every day | ORAL | Status: DC
  Administered 2015-05-23 – 2015-05-25 (×3): 10 mg via ORAL
  Filled 2015-05-22 (×4): qty 1

## 2015-05-22 MED ORDER — SODIUM POLYSTYRENE SULFONATE 15 GM/60ML PO SUSP
30.0000 g | Freq: Once | ORAL | Status: AC
  Administered 2015-05-22: 30 g via ORAL
  Filled 2015-05-22: qty 120

## 2015-05-22 MED ORDER — CALCIUM CARBONATE-VITAMIN D 500-200 MG-UNIT PO TABS
1.0000 | ORAL_TABLET | Freq: Every day | ORAL | Status: DC
  Administered 2015-05-23 – 2015-05-25 (×3): 1 via ORAL
  Filled 2015-05-22 (×4): qty 1

## 2015-05-22 MED ORDER — INSULIN ASPART 100 UNIT/ML ~~LOC~~ SOLN
10.0000 [IU] | Freq: Once | SUBCUTANEOUS | Status: AC
  Administered 2015-05-22: 10 [IU] via INTRAVENOUS
  Filled 2015-05-22: qty 10

## 2015-05-22 MED ORDER — PANTOPRAZOLE SODIUM 40 MG PO TBEC
40.0000 mg | DELAYED_RELEASE_TABLET | Freq: Every day | ORAL | Status: DC
  Administered 2015-05-23 – 2015-05-25 (×3): 40 mg via ORAL
  Filled 2015-05-22 (×3): qty 1

## 2015-05-22 MED ORDER — ASPIRIN EC 81 MG PO TBEC
81.0000 mg | DELAYED_RELEASE_TABLET | Freq: Every day | ORAL | Status: DC
  Administered 2015-05-23 – 2015-05-25 (×3): 81 mg via ORAL
  Filled 2015-05-22 (×3): qty 1

## 2015-05-22 MED ORDER — SODIUM CHLORIDE 0.9 % IV SOLN
Freq: Once | INTRAVENOUS | Status: AC
  Administered 2015-05-22: 15:00:00 via INTRAVENOUS

## 2015-05-22 NOTE — ED Notes (Signed)
Pt bladder scanned two times. No urine in bladder. MD notified.

## 2015-05-22 NOTE — ED Notes (Signed)
Dr Edd Fabian notified code sepsis in room

## 2015-05-22 NOTE — ED Provider Notes (Signed)
[facility] Emergency Department Provider Note     Time seen: ----------------------------------------- 3:04 PM on 05/22/2015 -----------------------------------------    I have reviewed the triage vital signs and the nursing notes.   HISTORY  Chief Complaint Urinary Frequency and Altered Mental Status    HPI Joanne Michael is a 82 y.o. female who is brought to the ER for some confusion, weakness and urinary frequency. Primary care doctor was concerned she may have a kidney stone, she has an outpatient CT scan scheduled for 2 weeks from now. She does have some low back pain, again is had some confusion and still states it hurts when she urinates. Nothing makesher symptoms better, reportedly she does have chronic kidney disease that is worsening.   Past Medical History  Diagnosis Date  . HLD (hyperlipidemia)   . HTN (hypertension)   . Cat allergies   . Hay fever     as child  . GERD (gastroesophageal reflux disease)   . Arthritis   . Fatigue   . Iron deficiency anemia   . Chronic renal failure   . Spinal stenosis   . Gout   . H/O Clostridium difficile infection   . Hiatal hernia   . Cancer [facility])     melanoma    Patient Active Problem List   Diagnosis Date Noted  . Kidney failure 04/26/2015  . Spinal stenosis 04/18/2015  . Osteoporosis, post-menopausal 04/18/2015  . Arthritis, degenerative 04/18/2015  . 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 Combined fat and carbohydrate induced hyperlipemia 04/18/2015  . Anemia, iron deficiency 04/18/2015  . Arthritis urica 04/18/2015  . Gastro-esophageal reflux disease without esophagitis 04/18/2015  . Essential (primary) hypertension 04/18/2015  . Chronic kidney disease, stage IV (severe) ([place]) 04/18/2015  . Back ache 04/18/2015  . Chronic low back pain (Location of Primary Source of Pain) (Bilateral) (R>L) 04/18/2015  . Lumbar facet syndrome (Location of Primary Source of Pain)  (Bilateral) (R>L) 04/18/2015  . Lumbar spondylosis 04/18/2015  . Chronic pain 04/18/2015  . Long term current use of opiate analgesic 04/18/2015  . Long term prescription opiate use 04/18/2015  . Opiate use 04/18/2015  . Encounter for therapeutic drug level monitoring 04/18/2015  . Encounter for chronic pain management 04/18/2015  . Chronic lower extremity pain (referred) (Location of Secondary source of pain) (Bilateral) (R>L) 04/18/2015  . Neurogenic pain 04/18/2015  . Neuropathic pain 04/18/2015  . Heart murmur, systolic [id number]  . Pulmonary parenchymal mass 12/02/2013  . Lung mass 12/02/2013  . SOB (shortness of breath) 11/03/2013  . Cough 11/03/2013  . Bilateral leg edema 07/22/2013  . Chronic renal insufficiency 07/22/2013  . Aortic valve stenosis 07/22/2013  . MURMUR 05/26/2009  . Hyperlipidemia 05/02/2009  . ESSENTIAL HYPERTENSION, BENIGN 05/02/2009  . SHORTNESS OF BREATH 05/02/2009  . CHEST PAIN UNSPECIFIED 05/02/2009    Past Surgical History  Procedure Laterality Date  . Cataract extraction    . Tonsillectomy    . Appendectomy    . Total abdominal hysterectomy    . Knee replacement- both    . Cholecystectomy    . Nose surgery    . Shoulder surgery    . Feet surgery    . Back surgery    . Hernia repair    . Rotator cuff repair      left    Allergies Codeine; Hydrocodone-acetaminophen; Iodine; Meperidine hcl; Other; and Propoxyphene n-acetaminophen  Social History Social History  Substance Use Topics  . Smoking status: Never Smoker   .  Smokeless tobacco: Never Used     Comment: Tobacco use- no   . Alcohol Use: No    Review of Systems Constitutional: Negative for fever. Eyes: Negative for visual changes. ENT: Negative for sore throat. Cardiovascular: Negative for chest pain. Respiratory: Negative for shortness of breath. Gastrointestinal: Negative for abdominal pain, vomiting and diarrhea. Genitourinary: Positive for dysuria,  polyuria Musculoskeletal: Negative for back pain. Skin: Negative for rash. Neurological: Positive for confusion, weakness  10-point ROS otherwise negative.  ____________________________________________   PHYSICAL EXAM:  VITAL SIGNS: ED Triage Vitals  Enc Vitals Group     BP 05/22/15 1431 84/33 mmHg     Pulse Rate 05/22/15 1431 64     Resp 05/22/15 1431 18     Temp 05/22/15 1431 97.5 F (36.4 C)     Temp src --      SpO2 05/22/15 1431 98 %     Weight 05/22/15 1431 130 lb (58.968 kg)     Height 05/22/15 1431 4\' 11"  (1.499 m)     Head Cir --      Peak Flow --      Pain Score 05/22/15 1445 8     Pain Loc --      Pain Edu? --      Excl. in [place]? --     Constitutional: Alert, no distress. Eyes: Conjunctivae are normal. PERRL. Normal extraocular movements. ENT   Head: Normocephalic and atraumatic.   Nose: No congestion/rhinnorhea.   Mouth/Throat: Mucous membranes are moist.   Neck: No stridor. Cardiovascular: Normal rate, regular rhythm. Normal and symmetric distal pulses are present in all extremities. No murmurs, rubs, or gallops. Respiratory: Normal respiratory effort without tachypnea nor retractions. Breath sounds are clear and equal bilaterally. No wheezes/rales/rhonchi. Gastrointestinal: Soft and nontender. No distention. No abdominal bruits.  Musculoskeletal: Nontender with normal range of motion in all extremities. No joint effusions.  No lower extremity tenderness nor edema. Neurologic:  Normal speech and language. Generalized weakness, nothing focal Skin:  Skin is warm, dry and intact. No rash noted. Psychiatric: Mood and affect are normal. Speech and behavior are normal. Patient exhibits appropriate insight and judgment. ____________________________________________  EKG: Interpreted by me.Sinus bradycardia with a rate of 53 bpm, normal PR interval, normal QRS, normal QT interval. Normal axis. Premature atrial  contractions  ____________________________________________  ED COURSE:  Pertinent labs & imaging results that were available during my care of the patient were reviewed by me and considered in my medical decision making (see chart for details). Patient likely with worsening chronic kidney disease, UTI, or both. I will also assess for urinary retention. ____________________________________________    LABS (pertinent positives/negatives)  Labs Reviewed  COMPREHENSIVE METABOLIC PANEL - Abnormal; Notable for the following:    Sodium 130 (*)    Potassium 5.9 (*)    Chloride 99 (*)    Glucose, Bld 110 (*)    BUN 51 (*)    Creatinine, Ser 4.44 (*)    ALT 7 (*)    GFR calc non Af Amer 8 (*)    GFR calc Af Amer 9 (*)    All other components within normal limits  CBC WITH DIFFERENTIAL/PLATELET - Abnormal; Notable for the following:    RBC 3.18 (*)    Hemoglobin 9.8 (*)    HCT 29.5 (*)    RDW 14.8 (*)    All other components within normal limits  CULTURE, BLOOD (ROUTINE X 2)  CULTURE, BLOOD (ROUTINE X 2)  URINE CULTURE  LACTIC ACID,  PLASMA  LACTIC ACID, PLASMA  URINALYSIS COMPLETEWITH MICROSCOPIC ([facility] ONLY)  BLOOD GAS, VENOUS   CRITICAL CARE Performed by: Earleen Newport   Total critical care time: 30 minutes  Critical care time was exclusive of separately billable procedures and treating other patients.  Critical care was necessary to treat or prevent imminent or life-threatening deterioration.  Critical care was time spent personally by me on the following activities: development of treatment plan with patient and/or surrogate as well as nursing, discussions with consultants, evaluation of patient's response to treatment, examination of patient, obtaining history from patient or surrogate, ordering and performing treatments and interventions, ordering and review of laboratory studies, ordering and review of radiographic studies, pulse oximetry and re-evaluation of  patient's condition.  RADIOLOGY Images were viewed by me  Chest x-ray IMPRESSION: No convincing acute cardiopulmonary disease. IMPRESSION: Suspected 2 mm nonobstructing left lower pole renal calculus. No ureteral or bladder calculi. No hydronephrosis.  Mildly thick-walled bladder, correlate for cystitis.  No evidence of bowel obstruction. Moderate right colonic stool burden, suggesting constipation. ____________________________________________  FINAL ASSESSMENT AND PLAN  Weakness, Uremia, hyperkalemia  Plan: Patient with labs and imaging as dictated above. Patient is presenting with worsening renal function and confusion secondary to same. She likely also has UTI. Her bladder scan was negative here for urinary retention, but she has not produced any urine yet for evaluation. She does present hyperkalemic, given her insulin, D50, Kayexalate, sodium bicarbonate. I will discuss with nephrology for possible dialysis if her symptoms don't improve.   Earleen Newport, MD   Earleen Newport, MD 05/22/15 [phone]

## 2015-05-22 NOTE — H&P (Signed)
[facility] [place] NAME: Joanne Michael    MR#:  [id number]  DATE OF BIRTH:  08/17/1924  DATE OF ADMISSION:  05/22/2015  PRIMARY CARE PHYSICIAN: Dion Body, MD   REQUESTING/REFERRING PHYSICIAN: Lenise Arena. MD  CHIEF COMPLAINT:   Chief Complaint  Patient presents with  . Urinary Frequency  . Altered Mental Status    HISTORY OF PRESENT ILLNESS: Joanne Michael  is a 82 y.o. female with a known history of hypertension, hyperlipidemia, GERD, chronic kidney disease, likely dementia who is brought in by her daughter due to worsening mental status and confusion. Her daughter reports the patient has had confusion over the past few months but is progressively worsens Friday. She also has been complaining of burning with urination. Patient was noted to have hyperkalemia also noticed to have worsening renal function. There is also currently concerned about a urinary tract infection however bladder scan showed no urine. Therefore no urinalysis is obtained. Patient not having any fevers or chills according to her daughter. Patient is very poor historian and is not answering any questions.  PAST MEDICAL HISTORY:   Past Medical History  Diagnosis Date  . HLD (hyperlipidemia)   . HTN (hypertension)   . Cat allergies   . Hay fever     as child  . GERD (gastroesophageal reflux disease)   . Arthritis   . Fatigue   . Iron deficiency anemia   . Chronic renal failure   . Spinal stenosis   . Gout   . H/O Clostridium difficile infection   . Hiatal hernia   . Cancer ([place])     melanoma    PAST SURGICAL HISTORY: Past Surgical History  Procedure Laterality Date  . Cataract extraction    . Tonsillectomy    . Appendectomy    . Total abdominal hysterectomy    . Knee replacement- both    . Cholecystectomy    . Nose surgery    . Shoulder surgery    . Feet surgery    . Back surgery    . Hernia repair    . Rotator cuff repair       left  . Melanoma excision      SOCIAL HISTORY:  Social History  Substance Use Topics  . Smoking status: Never Smoker   . Smokeless tobacco: Never Used     Comment: Tobacco use- no   . Alcohol Use: No    FAMILY HISTORY:  Family History  Problem Relation Age of Onset  . Ovarian cancer Mother   . Liver cancer Father   . Heart failure Father   . Lung cancer Brother   . Prostate cancer Brother   . Lung cancer Sister     DRUG ALLERGIES:  Allergies  Allergen Reactions  . Codeine Nausea And Vomiting  . Hydrocodone-Acetaminophen Other (See Comments)    Hallucinates  . Iodine Swelling and Other (See Comments)    Patient is allergic to seafood and this is the cause why she can't take medication.  . Meperidine Hcl     Unknown reactions.  . Other Swelling    Patient allergic to seafood, tongue swelling and discoloration.  . Propoxyphene N-Acetaminophen Other (See Comments)    Unknown reaction    REVIEW OF SYSTEMS:   CONSTITUTIONAL: Patient unable to provide me any review of systems MEDICATIONS AT HOME:  Prior to Admission medications   Medication Sig Start Date End Date Taking? Authorizing Provider  albuterol (PROVENTIL  HFA;VENTOLIN HFA) 108 (90 BASE) MCG/ACT inhaler Inhale 1-2 puffs into the lungs every 6 (six) hours as needed for wheezing or shortness of breath. 02/14/15   Minna Merritts, MD  allopurinol (ZYLOPRIM) 100 MG tablet Take 100 mg by mouth 2 (two) times daily.    Historical Provider, MD  ascorbic acid (VITAMIN C) 1000 MG tablet Take 1,000 mg by mouth daily.    Historical Provider, MD  aspirin EC 81 MG tablet Take 81 mg by mouth daily.    Historical Provider, MD  atorvastatin (LIPITOR) 20 MG tablet Take 20 mg by mouth daily.    Historical Provider, MD  benzonatate (TESSALON) 200 MG capsule TAKE 1 CAPSULE (200 MG TOTAL) BY MOUTH 3 (THREE) TIMES DAILY AS NEEDED FOR COUGH. 02/23/15   Historical Provider, MD  Calcium Carbonate-Vitamin D (CALTRATE 600+D) 600-400 MG-UNIT  per tablet Take 1 tablet by mouth daily.      Historical Provider, MD  cetirizine (ZYRTEC) 10 MG chewable tablet Chew 10 mg by mouth daily.      Historical Provider, MD  cloNIDine (CATAPRES) 0.2 MG tablet TAKE ONE TABLET BY MOUTH TWICE DAILY 01/11/15   Minna Merritts, MD  ferrous sulfate 325 (65 FE) MG tablet Take 325 mg by mouth daily with breakfast.    Historical Provider, MD  fluticasone (FLONASE) 50 MCG/ACT nasal spray Place 2 sprays into both nostrils at bedtime as needed for allergies or rhinitis.     Historical Provider, MD  furosemide (LASIX) 20 MG tablet Take 1 tablet (20 mg total) by mouth daily as needed. 02/14/15   Minna Merritts, MD  gabapentin (NEURONTIN) 300 MG capsule Take 1 capsule (300 mg total) by mouth 2 (two) times daily. 04/18/15   Milinda Pointer, MD  Multiple Vitamin (MULTIVITAMIN) tablet Take 1 tablet by mouth daily.    Historical Provider, MD  pantoprazole (PROTONIX) 40 MG tablet Take 1 tablet (40 mg total) by mouth daily. 02/14/15   Minna Merritts, MD  Probiotic Product (Ruby) Take 1 capsule by mouth daily.    Historical Provider, MD  traMADol (ULTRAM) 50 MG tablet Take 1 tablet (50 mg total) by mouth every 8 (eight) hours as needed for moderate pain or severe pain. 04/18/15   Milinda Pointer, MD      PHYSICAL EXAMINATION:   VITAL SIGNS: Blood pressure 103/50, pulse 60, temperature 97.5 F (36.4 C), resp. rate 20, height 4\' 11"  (1.499 m), weight 58.968 kg (130 lb), SpO2 100 %.  GENERAL:  82 y.o.-year-old patient lying in the bed with no acute distress.  EYES: Pupils equal, round, reactive to light and accommodation. No scleral icterus.  HEENT: Head atraumatic, normocephalic. Oropharynx appears dry and nasopharynx clear.  NECK:  Supple, no jugular venous distention. No thyroid enlargement, no tenderness.  LUNGS: Normal breath sounds bilaterally, no wheezing, rales,rhonchi or crepitation. No use of accessory muscles of respiration.   CARDIOVASCULAR: S1, S2 normal. No murmurs, rubs, or gallops.  ABDOMEN: Soft, nontender, nondistended. Bowel sounds present. No organomegaly or mass.  EXTREMITIES: No pedal edema, cyanosis, or clubbing.  NEUROLOGIC: Cranial nerves II through XII are intact. Muscle strength 5/5 in all extremities. Sensation intact. Gait not checked.  PSYCHIATRIC: The patient is alert orient to place person that time SKIN: No obvious rash, lesion, or ulcer.   LABORATORY PANEL:   CBC  Recent Labs Lab 05/22/15 1501  WBC 8.4  HGB 9.8*  HCT 29.5*  PLT 304  MCV 92.7  MCH 30.7  MCHC  33.1  RDW 14.8*  LYMPHSABS 1.8  MONOABS 0.5  EOSABS 0.1  BASOSABS 0.0   ------------------------------------------------------------------------------------------------------------------  Chemistries   Recent Labs Lab 05/22/15 1501  NA 130*  K 5.9*  CL 99*  CO2 23  GLUCOSE 110*  BUN 51*  CREATININE 4.44*  CALCIUM 9.3  AST 18  ALT 7*  ALKPHOS 93  BILITOT 0.7   ------------------------------------------------------------------------------------------------------------------ estimated creatinine clearance is 6.8 mL/min (by C-G formula based on Cr of 4.44). ------------------------------------------------------------------------------------------------------------------ No results for input(s): TSH, T4TOTAL, T3FREE, THYROIDAB in the last 72 hours.  Invalid input(s): FREET3   Coagulation profile No results for input(s): INR, PROTIME in the last 168 hours. ------------------------------------------------------------------------------------------------------------------- No results for input(s): DDIMER in the last 72 hours. -------------------------------------------------------------------------------------------------------------------  Cardiac Enzymes No results for input(s): CKMB, TROPONINI, MYOGLOBIN in the last 168 hours.  Invalid input(s):  CK ------------------------------------------------------------------------------------------------------------------ Invalid input(s): POCBNP  ---------------------------------------------------------------------------------------------------------------  Urinalysis    Component Value Date/Time   COLORURINE STRAW* 08/08/2014 1544   COLORURINE Yellow 09/24/2013 0239   APPEARANCEUR CLEAR* 08/08/2014 1544   APPEARANCEUR Clear 09/24/2013 0239   LABSPEC 1.009 08/08/2014 1544   LABSPEC 1.006 09/24/2013 0239   PHURINE 5.0 08/08/2014 1544   PHURINE 6.0 09/24/2013 0239   GLUCOSEU NEGATIVE 08/08/2014 1544   GLUCOSEU Negative 09/24/2013 0239   HGBUR NEGATIVE 08/08/2014 1544   HGBUR Negative 09/24/2013 0239   BILIRUBINUR NEGATIVE 08/08/2014 1544   BILIRUBINUR Negative 09/24/2013 0239   KETONESUR NEGATIVE 08/08/2014 1544   KETONESUR Negative 09/24/2013 0239   PROTEINUR NEGATIVE 08/08/2014 1544   PROTEINUR Negative 09/24/2013 0239   NITRITE NEGATIVE 08/08/2014 1544   NITRITE Negative 09/24/2013 0239   LEUKOCYTESUR NEGATIVE 08/08/2014 1544   LEUKOCYTESUR Negative 09/24/2013 0239     RADIOLOGY: Dg Chest 2 View  05/22/2015  CLINICAL DATA:  Pt was recently treated for UTI. Daughter reports was on abx and got better but now seems slightly confused and is having urinary frequency. PCP concerned may have stone; told daughter kidney function was a little down still after abx for previous UTI. Does have some mild CVA tenderness of left. EXAM: CHEST  2 VIEW COMPARISON:  10/11/2014 FINDINGS: Axilla normal in size. The aorta is uncoiled. Small hiatal hernia. No mediastinal or hilar masses or convincing adenopathy. Left medial lung base opacity is similar to the prior study most likely atelectasis. No convincing pneumonia. No pulmonary edema. No pleural effusion or pneumothorax. Lungs are hyperexpanded. Bony thorax is demineralized. IMPRESSION: No convincing acute cardiopulmonary disease. Electronically  Signed   By: Lajean Manes M.D.   On: 05/22/2015 15:19   Ct Renal Stone Study  05/22/2015  CLINICAL DATA:  Confusion, urinary frequency, recent UTI EXAM: CT ABDOMEN AND PELVIS WITHOUT CONTRAST TECHNIQUE: Multidetector CT imaging of the abdomen and pelvis was performed following the standard protocol without IV contrast. COMPARISON:  09/05/2012 FINDINGS: Lower chest: Mild patchy medial left lower lobe opacity, atelectasis versus pneumonia. Hepatobiliary: Unenhanced liver is unremarkable. Status post cholecystectomy. No intrahepatic or extrahepatic ductal dilatation. Pancreas: Within normal limits. Spleen: Within normal limits. Adrenals/Urinary Tract: Adrenal glands are within normal limits. Mild renal cortical atrophy, left greater than right. Suspected 2 mm nonobstructing medial left lower pole renal calculus (series 5/ image 69), unchanged since 2014. No ureteral or bladder calculi. No hydronephrosis. Mildly thick-walled bladder. Stomach/Bowel: Stomach is within normal limits. No evidence of bowel obstruction. Moderate right colonic stool burden. Vascular/Lymphatic: Atherosclerotic calcifications of the abdominal aorta and branch vessels. No evidence of abdominal aortic aneurysm. No suspicious abdominopelvic lymphadenopathy. Reproductive: Status  post hysterectomy. No adnexal masses. Other: No abdominopelvic ascites. Musculoskeletal: Degenerative changes of the visualized thoracolumbar spine. IMPRESSION: Suspected 2 mm nonobstructing left lower pole renal calculus. No ureteral or bladder calculi. No hydronephrosis. Mildly thick-walled bladder, correlate for cystitis. No evidence of bowel obstruction. Moderate right colonic stool burden, suggesting constipation. Electronically Signed   By: Julian Hy M.D.   On: 05/22/2015 15:27    EKG: Orders placed or performed during the hospital encounter of 05/22/15  . EKG 12-Lead  . EKG 12-Lead    IMPRESSION AND PLAN: Patient is a 82 year old white female  presenting with worsening confusion urinary frequency  1. Acute encephalopathy likely due to worsening renal function as a result of dehydration as well as suspected urinary tract infection. At this point we'll provide her with IV fluids. Try to obtain a UA once more dehydrated. M Parikh antibiotics with ceftriaxone. I suspect patient likely has underlying dementia.  2. Acute renal failure on chronic renal failure will give her IV fluids asked the nephrologist to see discontinue Lasix,   3. Hyperkalemia patient has received treatment in the ER we will also give her a dose of Kayexalate follow-up potassium in the morning  4. Hypertension continue clonidine  5. Lipidemia continue Lipitor  6. Miscellaneous we'll do Lovenox for DVT prophylaxis     All the records are reviewed and case discussed with ED provider. Management plans discussed with the patient, family and they are in agreement.  CODE STATUS: Code Status History    This patient does not have a recorded code status. Please follow your organizational policy for patients in this situation.       TOTAL TIME TAKING CARE OF THIS PATIENT: 55 minutes.    Dustin Flock M.D on 05/22/2015 at 4:46 PM  Between 7am to 6pm - Pager - [phone]  After 6pm go to [email or web address] - password [email or web address]  Office  [phone]  CC: Primary care physician; Dion Body, MD

## 2015-05-22 NOTE — ED Notes (Addendum)
Pt was recently treated for UTI.  Daughter reports was on abx and got better but not seems slightly confused and is having urinary frequency.  PCP concerned may have stone; told daughter kidney function was a little down still after abx for previous UTI.  Does have some mild CVA tenderness of left. Daughter reports went to get cup of coffee this AM and came back with empty cup and had forgotten to get the coffee.

## 2015-05-23 LAB — URINALYSIS COMPLETE WITH MICROSCOPIC (ARMC ONLY)
BILIRUBIN URINE: NEGATIVE
GLUCOSE, UA: NEGATIVE mg/dL
Hgb urine dipstick: NEGATIVE
Ketones, ur: NEGATIVE mg/dL
Nitrite: POSITIVE — AB
PROTEIN: NEGATIVE mg/dL
SPECIFIC GRAVITY, URINE: 1.006 (ref 1.005–1.030)
SQUAMOUS EPITHELIAL / LPF: NONE SEEN
pH: 6 (ref 5.0–8.0)

## 2015-05-23 LAB — BASIC METABOLIC PANEL
Anion gap: 10 (ref 5–15)
BUN: 51 mg/dL — AB (ref 6–20)
CO2: 23 mmol/L (ref 22–32)
Calcium: 8.3 mg/dL — ABNORMAL LOW (ref 8.9–10.3)
Chloride: 105 mmol/L (ref 101–111)
Creatinine, Ser: 4.3 mg/dL — ABNORMAL HIGH (ref 0.44–1.00)
GFR calc Af Amer: 10 mL/min — ABNORMAL LOW (ref >60)
GFR, EST NON AFRICAN AMERICAN: 8 mL/min — AB (ref >60)
GLUCOSE: 95 mg/dL (ref 65–99)
POTASSIUM: 4.6 mmol/L (ref 3.5–5.1)
Sodium: 138 mmol/L (ref 135–145)

## 2015-05-23 LAB — CBC
HCT: 26.5 % — ABNORMAL LOW (ref 35.0–47.0)
Hemoglobin: 8.8 g/dL — ABNORMAL LOW (ref 12.0–16.0)
MCH: 31 pg (ref 26.0–34.0)
MCHC: 33.3 g/dL (ref 32.0–36.0)
MCV: 93.2 fL (ref 80.0–100.0)
PLATELETS: 273 10*3/uL (ref 150–440)
RBC: 2.85 MIL/uL — AB (ref 3.80–5.20)
RDW: 14.5 % (ref 11.5–14.5)
WBC: 8.9 10*3/uL (ref 3.6–11.0)

## 2015-05-23 MED ORDER — SODIUM CHLORIDE 0.9 % IV BOLUS (SEPSIS)
250.0000 mL | Freq: Once | INTRAVENOUS | Status: AC
  Administered 2015-05-23: 250 mL via INTRAVENOUS

## 2015-05-23 NOTE — Progress Notes (Signed)
[facility] NAME: Joanne Michael    MR#:  [id number]  DATE OF BIRTH:  Apr 03, 1944  SUBJECTIVE:   Doing well this morning. She does state she has dysuria.   REVIEW OF SYSTEMS:    Review of Systems  Constitutional: Negative for fever, chills and malaise/fatigue.  HENT: Negative for ear discharge, ear pain, hearing loss, nosebleeds and sore throat.   Eyes: Negative for blurred vision and pain.  Respiratory: Negative for cough, hemoptysis, shortness of breath and wheezing.   Cardiovascular: Negative for chest pain, palpitations and leg swelling.  Gastrointestinal: Negative for nausea, vomiting, abdominal pain, diarrhea and blood in stool.  Genitourinary: Positive for dysuria.  Musculoskeletal: Negative for back pain.  Neurological: Negative for dizziness, tremors, speech change, focal weakness, seizures and headaches.  Endo/Heme/Allergies: Does not bruise/bleed easily.  Psychiatric/Behavioral: Negative for depression, suicidal ideas and hallucinations.    Tolerating Diet:yes      DRUG ALLERGIES:   Allergies  Allergen Reactions  . Codeine Nausea And Vomiting  . Hydrocodone-Acetaminophen Other (See Comments)    Hallucinates  . Iodine Swelling and Other (See Comments)    Patient is allergic to seafood and this is the cause why she can't take medication.  . Meperidine Hcl     Unknown reactions.  . Other Swelling    Patient allergic to seafood, tongue swelling and discoloration.  . Propoxyphene N-Acetaminophen Other (See Comments)    Unknown reaction    VITALS:  Blood pressure 103/48, pulse 84, temperature 97.5 F (36.4 C), temperature source Oral, resp. rate 18, height 4\' 11"  (1.499 m), weight 57.879 kg (127 lb 9.6 oz), SpO2 92 %.  PHYSICAL EXAMINATION:   Physical Exam  Constitutional: She is oriented to person, place, and time and well-developed, well-nourished, and in no distress. No distress.  HENT:  Head:  Normocephalic.  Eyes: No scleral icterus.  Neck: Normal range of motion. Neck supple. No JVD present. No tracheal deviation present.  Cardiovascular: Normal rate, regular rhythm and normal heart sounds.  Exam reveals no gallop and no friction rub.   No murmur heard. Pulmonary/Chest: Effort normal and breath sounds normal. No respiratory distress. She has no wheezes. She has no rales. She exhibits no tenderness.  Abdominal: Soft. Bowel sounds are normal. She exhibits no distension and no mass. There is no tenderness. There is no rebound and no guarding.  Musculoskeletal: Normal range of motion. She exhibits no edema.  Neurological: She is alert and oriented to person, place, and time.  Skin: Skin is warm. No rash noted. No erythema.  Psychiatric: Affect and judgment normal.      LABORATORY PANEL:   CBC  Recent Labs Lab 05/23/15 0429  WBC 8.9  HGB 8.8*  HCT 26.5*  PLT 273   ------------------------------------------------------------------------------------------------------------------  Chemistries   Recent Labs Lab 05/22/15 1501 05/23/15 0429  NA 130* 138  K 5.9* 4.6  CL 99* 105  CO2 23 23  GLUCOSE 110* 95  BUN 51* 51*  CREATININE 4.44* 4.30*  CALCIUM 9.3 8.3*  AST 18  --   ALT 7*  --   ALKPHOS 93  --   BILITOT 0.7  --    ------------------------------------------------------------------------------------------------------------------  Cardiac Enzymes No results for input(s): TROPONINI in the last 168 hours. ------------------------------------------------------------------------------------------------------------------  RADIOLOGY:  Dg Chest 2 View  05/22/2015  CLINICAL DATA:  Pt was recently treated for UTI. Daughter reports was on abx and got better but now seems slightly confused and is  having urinary frequency. PCP concerned may have stone; told daughter kidney function was a little down still after abx for previous UTI. Does have some mild CVA tenderness  of left. EXAM: CHEST  2 VIEW COMPARISON:  10/11/2014 FINDINGS: Axilla normal in size. The aorta is uncoiled. Small hiatal hernia. No mediastinal or hilar masses or convincing adenopathy. Left medial lung base opacity is similar to the prior study most likely atelectasis. No convincing pneumonia. No pulmonary edema. No pleural effusion or pneumothorax. Lungs are hyperexpanded. Bony thorax is demineralized. IMPRESSION: No convincing acute cardiopulmonary disease. Electronically Signed   By: Lajean Manes M.D.   On: 05/22/2015 15:19   Ct Renal Stone Study  05/22/2015  CLINICAL DATA:  Confusion, urinary frequency, recent UTI EXAM: CT ABDOMEN AND PELVIS WITHOUT CONTRAST TECHNIQUE: Multidetector CT imaging of the abdomen and pelvis was performed following the standard protocol without IV contrast. COMPARISON:  09/05/2012 FINDINGS: Lower chest: Mild patchy medial left lower lobe opacity, atelectasis versus pneumonia. Hepatobiliary: Unenhanced liver is unremarkable. Status post cholecystectomy. No intrahepatic or extrahepatic ductal dilatation. Pancreas: Within normal limits. Spleen: Within normal limits. Adrenals/Urinary Tract: Adrenal glands are within normal limits. Mild renal cortical atrophy, left greater than right. Suspected 2 mm nonobstructing medial left lower pole renal calculus (series 5/ image 69), unchanged since 2014. No ureteral or bladder calculi. No hydronephrosis. Mildly thick-walled bladder. Stomach/Bowel: Stomach is within normal limits. No evidence of bowel obstruction. Moderate right colonic stool burden. Vascular/Lymphatic: Atherosclerotic calcifications of the abdominal aorta and branch vessels. No evidence of abdominal aortic aneurysm. No suspicious abdominopelvic lymphadenopathy. Reproductive: Status post hysterectomy. No adnexal masses. Other: No abdominopelvic ascites. Musculoskeletal: Degenerative changes of the visualized thoracolumbar spine. IMPRESSION: Suspected 2 mm nonobstructing left  lower pole renal calculus. No ureteral or bladder calculi. No hydronephrosis. Mildly thick-walled bladder, correlate for cystitis. No evidence of bowel obstruction. Moderate right colonic stool burden, suggesting constipation. Electronically Signed   By: Julian Hy M.D.   On: 05/22/2015 15:27     ASSESSMENT AND PLAN:    82 year old female with essential hypertension and chronic renal failure who presents with acute encephalopathy.  1. Acute encephalopathy: Patient mental status appears to have improved. Continue IV fluid and empiric treatment for UTI. In and out catheter ordered for UA to value for urinary tract infection.   2. Acute on chronic renal failure: Follow-up in nephrology consult. IV fluids and continue to hold Lasix and nephrotoxic agents. Monitor input and output.  3. Hyperkalemia: Resolved with treatment in the emergency room.  4. Essential hypertension: Continue clonidine.  5. Hyperlipidemia: Continue Lipitor.   PT is recommending home with home health care. Plan discussed with daughter.  Management plans discussed with the patient and she is in agreement.  CODE STATUS: FULL  TOTAL TIME TAKING CARE OF THIS PATIENT: 33 minutes.     POSSIBLE D/C tomorrow, DEPENDING ON CLINICAL CONDITION.   Chaunce Winkels M.D on 05/23/2015 at 10:51 AM  Between 7am to 6pm - Pager - [phone] After 6pm go to [email or web address] - password [email or web address] [facility]  Office  [phone]  CC: Primary care physician; Dion Body, MD  Note: This dictation was prepared with Dragon dictation along with smaller phrase technology. Any transcriptional errors that result from this process are unintentional.

## 2015-05-23 NOTE — Progress Notes (Signed)
Dr. Benjie Karvonen paged about pts low BP and urinalysis results

## 2015-05-23 NOTE — Care Management Important Message (Signed)
Important Message  Patient Details  Name: Joanne Michael MRN: [id number] Date of Birth: 1924/12/25   Medicare Important Message Given:  Yes    Katrina Stack, RN 05/23/2015, 9:09 AM

## 2015-05-23 NOTE — Progress Notes (Signed)
[facility]  ROUNDING NOTE   Subjective:   Patient admitted with altered mental status and acute renal failure with hyperkalemia.  She was treated with Amoxicillin as outpatient for UTI e. Coli.   NS at 141m/hr  Objective:  Vital signs in last 24 hours:  Temp:  [97.5 F (36.4 C)-98.2 F (36.8 C)] 97.8 F (36.6 C) (03/27 1056) Pulse Rate:  [51-127] 58 (03/27 1056) Resp:  [16-25] 16 (03/27 1056) BP: (83-112)/(33-69) 99/50 mmHg (03/27 1143) SpO2:  [92 %-100 %] 95 % (03/27 1056) Weight:  [57.879 kg (127 lb 9.6 oz)-58.968 kg (130 lb)] 57.879 kg (127 lb 9.6 oz) (03/26 1819)  Weight change:  Filed Weights   05/22/15 1431 05/22/15 1819  Weight: 58.968 kg (130 lb) 57.879 kg (127 lb 9.6 oz)    Intake/Output: I/O last 3 completed shifts: In: 1200 [I.V.:1200] Out: 0    Intake/Output this shift:  Total I/O In: -  Out: 400 [Urine:400]  Physical Exam: General: NAD, sitting in chair  Head: Normocephalic, atraumatic. Moist oral mucosal membranes  Eyes: Anicteric, PERRL  Neck: Supple, trachea midline  Lungs:  Clear to auscultation  Heart: Regular rate and rhythm  Abdomen:  Soft, nontender,   Extremities: no peripheral edema.  Neurologic: Nonfocal, moving all four extremities, alert to self, place and time  Skin: No lesions       Basic Metabolic Panel:  Recent Labs Lab 05/22/15 1501 05/23/15 0429  NA 130* 138  K 5.9* 4.6  CL 99* 105  CO2 23 23  GLUCOSE 110* 95  BUN 51* 51*  CREATININE 4.44* 4.30*  CALCIUM 9.3 8.3*    Liver Function Tests:  Recent Labs Lab 05/22/15 1501  AST 18  ALT 7*  ALKPHOS 93  BILITOT 0.7  PROT 6.6  ALBUMIN 3.7   No results for input(s): LIPASE, AMYLASE in the last 168 hours. No results for input(s): AMMONIA in the last 168 hours.  CBC:  Recent Labs Lab 05/22/15 1501 05/23/15 0429  WBC 8.4 8.9  NEUTROABS 5.9  --   HGB 9.8* 8.8*  HCT 29.5* 26.5*  MCV 92.7 93.2  PLT 304 273    Cardiac Enzymes: No results  for input(s): CKTOTAL, CKMB, CKMBINDEX, TROPONINI in the last 168 hours.  BNP: Invalid input(s): POCBNP  CBG: No results for input(s): GLUCAP in the last 168 hours.  Microbiology: Results for orders placed or performed during the hospital encounter of 05/22/15  Culture, blood (routine x 2)     Status: None (Preliminary result)   Collection Time: 05/22/15  3:01 PM  Result Value Ref Range Status   Specimen Description BLOOD LEFT ANTECUBITAL  Final   Special Requests BOTTLES DRAWN AEROBIC AND ANAEROBIC  3CC  Final   Culture NO GROWTH < 24 HOURS  Final   Report Status PENDING  Incomplete  Culture, blood (routine x 2)     Status: None (Preliminary result)   Collection Time: 05/22/15  3:01 PM  Result Value Ref Range Status   Specimen Description BLOOD RIGHT ANTECUBITAL  Final   Special Requests BOTTLES DRAWN AEROBIC AND ANAEROBIC  2CC  Final   Culture NO GROWTH < 24 HOURS  Final   Report Status PENDING  Incomplete    Coagulation Studies: No results for input(s): LABPROT, INR in the last 72 hours.  Urinalysis:  Recent Labs  05/23/15 1003  COLORURINE AMBER*  LABSPEC 1.006  PHURINE 6.0  GLUCOSEU NEGATIVE  HGBUR NEGATIVE  BILIRUBINUR NEGATIVE  KETONESUR NEGATIVE  PROTEINUR NEGATIVE  NITRITE POSITIVE*  LEUKOCYTESUR 3+*      Imaging: Dg Chest 2 View  05/22/2015  CLINICAL DATA:  Pt was recently treated for UTI. Daughter reports was on abx and got better but now seems slightly confused and is having urinary frequency. PCP concerned may have stone; told daughter kidney function was a little down still after abx for previous UTI. Does have some mild CVA tenderness of left. EXAM: CHEST  2 VIEW COMPARISON:  10/11/2014 FINDINGS: Axilla normal in size. The aorta is uncoiled. Small hiatal hernia. No mediastinal or hilar masses or convincing adenopathy. Left medial lung base opacity is similar to the prior study most likely atelectasis. No convincing pneumonia. No pulmonary edema. No  pleural effusion or pneumothorax. Lungs are hyperexpanded. Bony thorax is demineralized. IMPRESSION: No convincing acute cardiopulmonary disease. Electronically Signed   By: Lajean Manes M.D.   On: 05/22/2015 15:19   Ct Renal Stone Study  05/22/2015  CLINICAL DATA:  Confusion, urinary frequency, recent UTI EXAM: CT ABDOMEN AND PELVIS WITHOUT CONTRAST TECHNIQUE: Multidetector CT imaging of the abdomen and pelvis was performed following the standard protocol without IV contrast. COMPARISON:  09/05/2012 FINDINGS: Lower chest: Mild patchy medial left lower lobe opacity, atelectasis versus pneumonia. Hepatobiliary: Unenhanced liver is unremarkable. Status post cholecystectomy. No intrahepatic or extrahepatic ductal dilatation. Pancreas: Within normal limits. Spleen: Within normal limits. Adrenals/Urinary Tract: Adrenal glands are within normal limits. Mild renal cortical atrophy, left greater than right. Suspected 2 mm nonobstructing medial left lower pole renal calculus (series 5/ image 69), unchanged since 2014. No ureteral or bladder calculi. No hydronephrosis. Mildly thick-walled bladder. Stomach/Bowel: Stomach is within normal limits. No evidence of bowel obstruction. Moderate right colonic stool burden. Vascular/Lymphatic: Atherosclerotic calcifications of the abdominal aorta and branch vessels. No evidence of abdominal aortic aneurysm. No suspicious abdominopelvic lymphadenopathy. Reproductive: Status post hysterectomy. No adnexal masses. Other: No abdominopelvic ascites. Musculoskeletal: Degenerative changes of the visualized thoracolumbar spine. IMPRESSION: Suspected 2 mm nonobstructing left lower pole renal calculus. No ureteral or bladder calculi. No hydronephrosis. Mildly thick-walled bladder, correlate for cystitis. No evidence of bowel obstruction. Moderate right colonic stool burden, suggesting constipation. Electronically Signed   By: Julian Hy M.D.   On: 05/22/2015 15:27     Medications:    . sodium chloride 100 mL/hr at 05/22/15 2323   . acidophilus  2 capsule Oral Daily  . allopurinol  100 mg Oral BID  . aspirin EC  81 mg Oral Daily  . atorvastatin  20 mg Oral Daily  . calcium-vitamin D  1 tablet Oral Daily  . cefTRIAXone (ROCEPHIN)  IV  1 g Intravenous Q24H  . ferrous sulfate  325 mg Oral Q breakfast  . gabapentin  300 mg Oral BID  . heparin  5,000 Units Subcutaneous 3 times per day  . loratadine  10 mg Oral Daily  . multivitamin with minerals  1 tablet Oral Daily  . pantoprazole  40 mg Oral Daily  . sodium chloride flush  3 mL Intravenous Q12H  . ascorbic acid  1,000 mg Oral Daily   albuterol, fluticasone  Assessment/ Plan:  [name] is a 82 y.o. white  female with hypertension and degenerative joint disease, gout, C Diff colitis (2014), ARF, GERD  1. Acute Renal Failure with hyperkalemia and metabolic acidosis on chronic kidney disease stage IV: baseline creatinine 1.71, eGFR of 12 on 11/17/14.  Left kidney atrophy on imaging and nonobstructive stone.  Most likely due to prerenal azotemia. Agree with IV fluids,  monitor urine output, volume status, electrolytes and renal function.  - hold enalapril and furosemide. Do not discharge on these medications.   2. Urinary tract infection: e. Coli pan sensitive treated with Augmentin as outpatient. Afebrile and normal wbc -  Ceftriaxone empirically.  - urine culture from admission pending.   3. Hypertension: some hypotension: holding clonidine, enalapril and furosemide.   4. Anemia with chronic kidney disease: hemoglobin 8.8 - on iron supplements     LOS: 1 Neli Fofana 3/27/201711:58 AM

## 2015-05-23 NOTE — Progress Notes (Signed)
Unable to obtain urine sample R/T to stool mixing into urine, pt. Had a bowel movement during urination. Will attempt to collect at next urination.

## 2015-05-23 NOTE — Evaluation (Signed)
Physical Therapy Evaluation Patient Details Name: Joanne Michael MRN: [id number] DOB: [birth year] Today's Date: 05/23/2015   History of Present Illness  82 yo F brought to ED by her daughter secondary to increasing confusion. She was found to have acute renal failure, hyperkalemia, cystitis and AMS. Pt was recently treated for UTI. PMH includes HTN, HDL, chronic renal failure and cancer.  Clinical Impression  Pt demonstrated generalized weakness and difficulty walking. She is oriented to person and place. Pt is mod I for bed mobility. She requires min guard for transfers and ambulation of 150 ft without AD. Occasional cues for safety provided during mobility. HHPT is recommended to increase pt's strength and safety during mobility as well as assess home safety. PT recommended that pt have assistance for any mobility temporarily after hospital discharge for safety. Daughter stated that her or her husband would be able to be with pt. Pt will benefit from skilled PT services to increase functional I and mobility for safe discharge.     Follow Up Recommendations Home health PT;Supervision for mobility/OOB    Equipment Recommendations  None recommended by PT    Recommendations for Other Services       Precautions / Restrictions Precautions Precautions: Fall      Mobility  Bed Mobility Overal bed mobility: Modified Independent Bed Mobility: Supine to Sit     Supine to sit: Modified independent (Device/Increase time)     General bed mobility comments: uses rail  Transfers Overall transfer level: Needs assistance Equipment used: None Transfers: Sit to/from [facility] Sit to Stand: Min guard Stand pivot transfers: Min guard       General transfer comment: pt steady with no LOB, ocassional cues for safety  Ambulation/Gait Ambulation/Gait assistance: Min guard Ambulation Distance (Feet): 150 Feet Assistive device: None Gait Pattern/deviations: Decreased  stride length;Narrow base of support     General Gait Details: Pt generally steady with no LOB. Slightly unsmooth ambulation due to LE stiffness and weakness.  Stairs            Wheelchair Mobility    Modified Rankin (Stroke Patients Only)       Balance Overall balance assessment: Needs assistance Sitting-balance support: Feet supported Sitting balance-Leahy Scale: Good Sitting balance - Comments: maintains independently   Standing balance support: No upper extremity supported Standing balance-Leahy Scale: Fair Standing balance comment: steady with no LOB                             Pertinent Vitals/Pain Pain Assessment: No/denies pain    Home Living Family/patient expects to be discharged to:: Private residence Living Arrangements: Children Available Help at Discharge: Family Type of Home: House Home Access: Level entry     Home Layout: Two level;Able to live on main level with bedroom/bathroom Home Equipment: Gilford Rile - 2 wheels;Cane - single point      Prior Function Level of Independence: Independent         Comments: Pt ambulated without AD household and limited community distances per pt and daughter. She lives with her daughter and son in law who are with pt most of the time.      Hand Dominance        Extremity/Trunk Assessment   Upper Extremity Assessment: Generalized weakness           Lower Extremity Assessment: Generalized weakness         Communication   Communication: No difficulties  Cognition  Arousal/Alertness: Awake/alert Behavior During Therapy: WFL for tasks assessed/performed Overall Cognitive Status: History of cognitive impairments - at baseline                      General Comments General comments (skin integrity, edema, etc.): tender shins    Exercises Other Exercises Other Exercises: B LE supine therex: ankle pumps, heel slides, hip abd slides, hip add squeezes x10 each. Cues for proper  technique.      Assessment/Plan    PT Assessment Patient needs continued PT services  PT Diagnosis Difficulty walking;Generalized weakness   PT Problem List Decreased strength;Decreased activity tolerance;Decreased balance;Decreased mobility;Decreased cognition;Decreased safety awareness  PT Treatment Interventions Gait training;Therapeutic activities;Therapeutic exercise;Balance training;Patient/family education   PT Goals (Current goals can be found in the Care Plan section) Acute Rehab PT Goals Patient Stated Goal: to go home PT Goal Formulation: With patient/family Time For Goal Achievement: 06/06/15 Potential to Achieve Goals: Good    Frequency Min 2X/week   Barriers to discharge   none    Co-evaluation               End of Session Equipment Utilized During Treatment: Gait belt Activity Tolerance: Patient tolerated treatment well Patient left: in chair;with call bell/phone within reach;with chair alarm set;with nursing/sitter in room;with family/visitor present Nurse Communication: Mobility status         Time: [id number] PT Time Calculation (min) (ACUTE ONLY): 25 min   Charges:   PT Evaluation $PT Eval Low Complexity: 1 Procedure PT Treatments $Therapeutic Exercise: 8-22 mins   PT G Codes:        Neoma Laming, PT, DPT  05/23/2015, 10:27 AM [phone]

## 2015-05-23 NOTE — Care Management Note (Signed)
Case Management Note  Patient Details  Name: Joanne Michael MRN: [id number] Date of Birth: [birth year]  Subjective/Objective: CM consult for discharge planning. Patient has mild dementia. TC to daughter, Mrs. Loyal Buba. Discussed PT recommendations. Provided a list of providers. She choices [facility]. Daughter feels patient would benefit from a  HHA. CM currently feels PT and HHA is appropriate. Referral to [facility] with [facility] for PT and HHA. Patient lives at home with her daughter. She stays at home with her son in law during the day. Daughter denies DME needs.                   Action/Plan: PT/HHA. Following progress.   Expected Discharge Date:                  Expected Discharge Plan:  [place]  In-House Referral:     Discharge planning Services  CM Consult  Post Acute Care Choice:  Home Health Choice offered to:  Adult Children  DME Arranged:    DME Agency:     HH Arranged:  PT, Nurse's Aide [place] Agency:  [place]  Status of Service:  In process, will continue to follow  Medicare Important Message Given:  Yes Date Medicare IM Given:    Medicare IM give by:    Date Additional Medicare IM Given:    Additional Medicare Important Message give by:     If discussed at [place] of Stay Meetings, dates discussed:    Additional Comments:  Jolly Mango, RN 05/23/2015, 11:18 AM

## 2015-05-24 LAB — BLOOD GAS, VENOUS
ACID-BASE EXCESS: 0.4 mmol/L (ref 0.0–3.0)
BICARBONATE: 26.4 meq/L (ref 21.0–28.0)
FIO2: 0.21
PATIENT TEMPERATURE: 37
PCO2 VEN: 50 mmHg (ref 44.0–60.0)
pH, Ven: 7.33 (ref 7.320–7.430)

## 2015-05-24 LAB — BASIC METABOLIC PANEL
Anion gap: 2 — ABNORMAL LOW (ref 5–15)
BUN: 39 mg/dL — ABNORMAL HIGH (ref 6–20)
CHLORIDE: 112 mmol/L — AB (ref 101–111)
CO2: 24 mmol/L (ref 22–32)
Calcium: 8.1 mg/dL — ABNORMAL LOW (ref 8.9–10.3)
Creatinine, Ser: 3.04 mg/dL — ABNORMAL HIGH (ref 0.44–1.00)
GFR, EST AFRICAN AMERICAN: 15 mL/min — AB (ref >60)
GFR, EST NON AFRICAN AMERICAN: 13 mL/min — AB (ref >60)
Glucose, Bld: 104 mg/dL — ABNORMAL HIGH (ref 65–99)
POTASSIUM: 4.2 mmol/L (ref 3.5–5.1)
Sodium: 138 mmol/L (ref 135–145)

## 2015-05-24 LAB — URINE CULTURE

## 2015-05-24 NOTE — Progress Notes (Signed)
[facility] [place] NAME: Joanne Michael    MR#:  [id number]  DATE OF BIRTH:  [birth year]  SUBJECTIVE:   no acute events over night Creatinine improving no dysuria   REVIEW OF SYSTEMS:    Review of Systems  Constitutional: Negative for fever, chills and malaise/fatigue.  HENT: Negative for ear discharge, ear pain, hearing loss, nosebleeds and sore throat.   Eyes: Negative for blurred vision and pain.  Respiratory: Negative for cough, hemoptysis, shortness of breath and wheezing.   Cardiovascular: Negative for chest pain, palpitations and leg swelling.  Gastrointestinal: Negative for nausea, vomiting, abdominal pain, diarrhea and blood in stool.  Genitourinary: Negative for dysuria, urgency, frequency and hematuria.  Musculoskeletal: Negative for back pain.  Neurological: Negative for dizziness, tremors, speech change, focal weakness, seizures and headaches.  Endo/Heme/Allergies: Does not bruise/bleed easily.  Psychiatric/Behavioral: Negative for depression, suicidal ideas and hallucinations.    Tolerating Diet:yes      DRUG ALLERGIES:   Allergies  Allergen Reactions  . Codeine Nausea And Vomiting  . Hydrocodone-Acetaminophen Other (See Comments)    Hallucinates  . Iodine Swelling and Other (See Comments)    Patient is allergic to seafood and this is the cause why she can't take medication.  . Meperidine Hcl     Unknown reactions.  . Other Swelling    Patient allergic to seafood, tongue swelling and discoloration.  . Propoxyphene N-Acetaminophen Other (See Comments)    Unknown reaction    VITALS:  Blood pressure 121/40, pulse 62, temperature 97.8 F (36.6 C), temperature source Oral, resp. rate 16, height 4\' 11"  (1.499 m), weight 57.879 kg (127 lb 9.6 oz), SpO2 94 %.  PHYSICAL EXAMINATION:   Physical Exam  Constitutional: She is oriented to person, place, and time and well-developed, well-nourished, and in no  distress. No distress.  HENT:  Head: Normocephalic.  Eyes: No scleral icterus.  Neck: Normal range of motion. Neck supple. No JVD present. No tracheal deviation present.  Cardiovascular: Normal rate, regular rhythm and normal heart sounds.  Exam reveals no gallop and no friction rub.   No murmur heard. Pulmonary/Chest: Effort normal and breath sounds normal. No respiratory distress. She has no wheezes. She has no rales. She exhibits no tenderness.  Abdominal: Soft. Bowel sounds are normal. She exhibits no distension and no mass. There is no tenderness. There is no rebound and no guarding.  Musculoskeletal: Normal range of motion. She exhibits no edema.  Neurological: She is alert and oriented to person, place, and time.  Skin: Skin is warm. No rash noted. No erythema.  Psychiatric: Affect and judgment normal.      LABORATORY PANEL:   CBC  Recent Labs Lab 05/23/15 0429  WBC 8.9  HGB 8.8*  HCT 26.5*  PLT 273   ------------------------------------------------------------------------------------------------------------------  Chemistries   Recent Labs Lab 05/22/15 1501  05/24/15 0807  NA 130*  < > 138  K 5.9*  < > 4.2  CL 99*  < > 112*  CO2 23  < > 24  GLUCOSE 110*  < > 104*  BUN 51*  < > 39*  CREATININE 4.44*  < > 3.04*  CALCIUM 9.3  < > 8.1*  AST 18  --   --   ALT 7*  --   --   ALKPHOS 93  --   --   BILITOT 0.7  --   --   < > = values in this interval not displayed. ------------------------------------------------------------------------------------------------------------------  Cardiac Enzymes No results for input(s): TROPONINI in the last 168 hours. ------------------------------------------------------------------------------------------------------------------  RADIOLOGY:  Dg Chest 2 View  05/22/2015  CLINICAL DATA:  Pt was recently treated for UTI. Daughter reports was on abx and got better but now seems slightly confused and is having urinary frequency.  PCP concerned may have stone; told daughter kidney function was a little down still after abx for previous UTI. Does have some mild CVA tenderness of left. EXAM: CHEST  2 VIEW COMPARISON:  10/11/2014 FINDINGS: Axilla normal in size. The aorta is uncoiled. Small hiatal hernia. No mediastinal or hilar masses or convincing adenopathy. Left medial lung base opacity is similar to the prior study most likely atelectasis. No convincing pneumonia. No pulmonary edema. No pleural effusion or pneumothorax. Lungs are hyperexpanded. Bony thorax is demineralized. IMPRESSION: No convincing acute cardiopulmonary disease. Electronically Signed   By: Lajean Manes M.D.   On: 05/22/2015 15:19   Ct Renal Stone Study  05/22/2015  CLINICAL DATA:  Confusion, urinary frequency, recent UTI EXAM: CT ABDOMEN AND PELVIS WITHOUT CONTRAST TECHNIQUE: Multidetector CT imaging of the abdomen and pelvis was performed following the standard protocol without IV contrast. COMPARISON:  09/05/2012 FINDINGS: Lower chest: Mild patchy medial left lower lobe opacity, atelectasis versus pneumonia. Hepatobiliary: Unenhanced liver is unremarkable. Status post cholecystectomy. No intrahepatic or extrahepatic ductal dilatation. Pancreas: Within normal limits. Spleen: Within normal limits. Adrenals/Urinary Tract: Adrenal glands are within normal limits. Mild renal cortical atrophy, left greater than right. Suspected 2 mm nonobstructing medial left lower pole renal calculus (series 5/ image 69), unchanged since 2014. No ureteral or bladder calculi. No hydronephrosis. Mildly thick-walled bladder. Stomach/Bowel: Stomach is within normal limits. No evidence of bowel obstruction. Moderate right colonic stool burden. Vascular/Lymphatic: Atherosclerotic calcifications of the abdominal aorta and branch vessels. No evidence of abdominal aortic aneurysm. No suspicious abdominopelvic lymphadenopathy. Reproductive: Status post hysterectomy. No adnexal masses. Other: No  abdominopelvic ascites. Musculoskeletal: Degenerative changes of the visualized thoracolumbar spine. IMPRESSION: Suspected 2 mm nonobstructing left lower pole renal calculus. No ureteral or bladder calculi. No hydronephrosis. Mildly thick-walled bladder, correlate for cystitis. No evidence of bowel obstruction. Moderate right colonic stool burden, suggesting constipation. Electronically Signed   By: Julian Hy M.D.   On: 05/22/2015 15:27     ASSESSMENT AND PLAN:    82 year old female with essential hypertension and chronic renal failure who presents with acute encephalopathy.  1. Acute encephalopathyDue to urinary tract infection: Patient mental status Improved Continue IV fluid and Rocephin Follow up on urine culture   2. Acute on chronic renal failure stage IV: Baseline creatinine around 2 Imroving with IV fluids.  Discontinue Lasix and ACE inhibitor at discharge.   3. Hyperkalemia: Resolved with treatment in the emergency room.  4. Essential hypertension: Clonidine discontinued due to low normal blood pressure. May not need blood pressure medications at discharge. 5. Hyperlipidemia: Continue Lipitor.   PT is recommending home with home health care. Plan discussed with daughter.  Management plans discussed with the patient and she is in agreement.  CODE STATUS: FULL  TOTAL TIME TAKING CARE OF THIS PATIENT: 25 minutes.   PT consult for disposition  POSSIBLE D/C tomorrow, DEPENDING ON creatinine  Soni Kegel M.D on 05/24/2015 at 10:56 AM  Between 7am to 6pm - Pager - [phone] After 6pm go to [email or web address] - password [email or web address] [facility]  Office  [phone]  CC: Primary care physician; Dion Body, MD  Note: This dictation was prepared with Dragon dictation along with  smaller phrase technology. Any transcriptional errors that result from this process are unintentional. 

## 2015-05-24 NOTE — Progress Notes (Signed)
A&O with some forgetfulness. IV fluids infusing. On IV antibiotics. On RA. No s/s distress.

## 2015-05-24 NOTE — Progress Notes (Signed)
[facility]  ROUNDING NOTE   Subjective:   Daughter at bedside this morning. More alert and less confused.  UOP 1500 NS at 188m/hr  Clonidine caused hypotension yesterday  Objective:  Vital signs in last 24 hours:  Temp:  [97.8 F (36.6 C)] 97.8 F (36.6 C) (03/28 0544) Pulse Rate:  [58-62] 62 (03/28 0544) Resp:  [16] 16 (03/28 0544) BP: (89-130)/(40-50) 121/40 mmHg (03/28 0544) SpO2:  [94 %-100 %] 94 % (03/28 0544)  Weight change:  Filed Weights   05/22/15 1431 05/22/15 1819  Weight: 58.968 kg (130 lb) 57.879 kg (127 lb 9.6 oz)    Intake/Output: I/O last 3 completed shifts: In: 3626.7 [P.O.:120; I.V.:3506.7] Out: 1500 [Urine:1500]   Intake/Output this shift:  Total I/O In: 240 [P.O.:240] Out: 0   Physical Exam: General: NAD, laying in bed  Head: Normocephalic, atraumatic. Moist oral mucosal membranes  Eyes: Anicteric, PERRL  Neck: Supple, trachea midline  Lungs:  Clear to auscultation  Heart: Regular rate and rhythm  Abdomen:  Soft, nontender,   Extremities: no peripheral edema.  Neurologic: Nonfocal, moving all four extremities, alert to self, place and time  Skin: No lesions       Basic Metabolic Panel:  Recent Labs Lab 05/22/15 1501 05/23/15 0429 05/24/15 0807  NA 130* 138 138  K 5.9* 4.6 4.2  CL 99* 105 112*  CO2 23 23 24   GLUCOSE 110* 95 104*  BUN 51* 51* 39*  CREATININE 4.44* 4.30* 3.04*  CALCIUM 9.3 8.3* 8.1*    Liver Function Tests:  Recent Labs Lab 05/22/15 1501  AST 18  ALT 7*  ALKPHOS 93  BILITOT 0.7  PROT 6.6  ALBUMIN 3.7   No results for input(s): LIPASE, AMYLASE in the last 168 hours. No results for input(s): AMMONIA in the last 168 hours.  CBC:  Recent Labs Lab 05/22/15 1501 05/23/15 0429  WBC 8.4 8.9  NEUTROABS 5.9  --   HGB 9.8* 8.8*  HCT 29.5* 26.5*  MCV 92.7 93.2  PLT 304 273    Cardiac Enzymes: No results for input(s): CKTOTAL, CKMB, CKMBINDEX, TROPONINI in the last 168  hours.  BNP: Invalid input(s): POCBNP  CBG: No results for input(s): GLUCAP in the last 168 hours.  Microbiology: Results for orders placed or performed during the hospital encounter of 05/22/15  Culture, blood (routine x 2)     Status: None (Preliminary result)   Collection Time: 05/22/15  3:01 PM  Result Value Ref Range Status   Specimen Description BLOOD LEFT ANTECUBITAL  Final   Special Requests BOTTLES DRAWN AEROBIC AND ANAEROBIC  3CC  Final   Culture NO GROWTH < 24 HOURS  Final   Report Status PENDING  Incomplete  Culture, blood (routine x 2)     Status: None (Preliminary result)   Collection Time: 05/22/15  3:01 PM  Result Value Ref Range Status   Specimen Description BLOOD RIGHT ANTECUBITAL  Final   Special Requests BOTTLES DRAWN AEROBIC AND ANAEROBIC  2CC  Final   Culture NO GROWTH < 24 HOURS  Final   Report Status PENDING  Incomplete    Coagulation Studies: No results for input(s): LABPROT, INR in the last 72 hours.  Urinalysis:  Recent Labs  05/23/15 1003  COLORURINE AMBER*  LABSPEC 1.006  PHURINE 6.0  GLUCOSEU NEGATIVE  HGBUR NEGATIVE  BILIRUBINUR NEGATIVE  KETONESUR NEGATIVE  PROTEINUR NEGATIVE  NITRITE POSITIVE*  LEUKOCYTESUR 3+*      Imaging: Dg Chest 2 View  05/22/2015  CLINICAL DATA:  Pt was recently treated for UTI. Daughter reports was on abx and got better but now seems slightly confused and is having urinary frequency. PCP concerned may have stone; told daughter kidney function was a little down still after abx for previous UTI. Does have some mild CVA tenderness of left. EXAM: CHEST  2 VIEW COMPARISON:  10/11/2014 FINDINGS: Axilla normal in size. The aorta is uncoiled. Small hiatal hernia. No mediastinal or hilar masses or convincing adenopathy. Left medial lung base opacity is similar to the prior study most likely atelectasis. No convincing pneumonia. No pulmonary edema. No pleural effusion or pneumothorax. Lungs are hyperexpanded. Bony thorax  is demineralized. IMPRESSION: No convincing acute cardiopulmonary disease. Electronically Signed   By: Lajean Manes M.D.   On: 05/22/2015 15:19   Ct Renal Stone Study  05/22/2015  CLINICAL DATA:  Confusion, urinary frequency, recent UTI EXAM: CT ABDOMEN AND PELVIS WITHOUT CONTRAST TECHNIQUE: Multidetector CT imaging of the abdomen and pelvis was performed following the standard protocol without IV contrast. COMPARISON:  09/05/2012 FINDINGS: Lower chest: Mild patchy medial left lower lobe opacity, atelectasis versus pneumonia. Hepatobiliary: Unenhanced liver is unremarkable. Status post cholecystectomy. No intrahepatic or extrahepatic ductal dilatation. Pancreas: Within normal limits. Spleen: Within normal limits. Adrenals/Urinary Tract: Adrenal glands are within normal limits. Mild renal cortical atrophy, left greater than right. Suspected 2 mm nonobstructing medial left lower pole renal calculus (series 5/ image 69), unchanged since 2014. No ureteral or bladder calculi. No hydronephrosis. Mildly thick-walled bladder. Stomach/Bowel: Stomach is within normal limits. No evidence of bowel obstruction. Moderate right colonic stool burden. Vascular/Lymphatic: Atherosclerotic calcifications of the abdominal aorta and branch vessels. No evidence of abdominal aortic aneurysm. No suspicious abdominopelvic lymphadenopathy. Reproductive: Status post hysterectomy. No adnexal masses. Other: No abdominopelvic ascites. Musculoskeletal: Degenerative changes of the visualized thoracolumbar spine. IMPRESSION: Suspected 2 mm nonobstructing left lower pole renal calculus. No ureteral or bladder calculi. No hydronephrosis. Mildly thick-walled bladder, correlate for cystitis. No evidence of bowel obstruction. Moderate right colonic stool burden, suggesting constipation. Electronically Signed   By: Julian Hy M.D.   On: 05/22/2015 15:27     Medications:   . sodium chloride 100 mL/hr at 05/23/15 1750   . acidophilus  2  capsule Oral Daily  . allopurinol  100 mg Oral BID  . aspirin EC  81 mg Oral Daily  . atorvastatin  20 mg Oral Daily  . calcium-vitamin D  1 tablet Oral Daily  . cefTRIAXone (ROCEPHIN)  IV  1 g Intravenous Q24H  . ferrous sulfate  325 mg Oral Q breakfast  . gabapentin  300 mg Oral BID  . heparin  5,000 Units Subcutaneous 3 times per day  . loratadine  10 mg Oral Daily  . multivitamin with minerals  1 tablet Oral Daily  . pantoprazole  40 mg Oral Daily  . sodium chloride flush  3 mL Intravenous Q12H  . ascorbic acid  1,000 mg Oral Daily   albuterol, fluticasone  Assessment/ Plan:  [name] is a 82 y.o. white  female with hypertension and degenerative joint disease, gout, C Diff colitis (2014), ARF, GERD  1. Acute Renal Failure with hyperkalemia and metabolic acidosis on chronic kidney disease stage IV: baseline creatinine 1.71, eGFR of 27 on 11/17/14.  Left kidney atrophy on imaging and nonobstructive stone.  Most likely due to prerenal azotemia. Agree with IV fluids, monitor urine output, volume status, electrolytes and renal function.  - hold enalapril and furosemide. Do not discharge  on these medications.   2. Urinary tract infection: e. Coli pan sensitive treated with Augmentin as outpatient. Afebrile and normal wbc -  Ceftriaxone empirically.  - urine culture pending.   3. Hypertension: some hypotension: holding clonidine, enalapril and furosemide.   4. Anemia with chronic kidney disease: hemoglobin 8.8 - on iron supplements     LOS: 2 Masaru Chamberlin 3/28/201710:54 AM

## 2015-05-25 ENCOUNTER — Inpatient Hospital Stay: Payer: Medicare Other

## 2015-05-25 LAB — BASIC METABOLIC PANEL
Anion gap: 5 (ref 5–15)
BUN: 36 mg/dL — AB (ref 6–20)
CALCIUM: 8.4 mg/dL — AB (ref 8.9–10.3)
CO2: 24 mmol/L (ref 22–32)
CREATININE: 2.13 mg/dL — AB (ref 0.44–1.00)
Chloride: 112 mmol/L — ABNORMAL HIGH (ref 101–111)
GFR calc Af Amer: 23 mL/min — ABNORMAL LOW (ref >60)
GFR, EST NON AFRICAN AMERICAN: 20 mL/min — AB (ref >60)
GLUCOSE: 109 mg/dL — AB (ref 65–99)
Potassium: 4 mmol/L (ref 3.5–5.1)
SODIUM: 141 mmol/L (ref 135–145)

## 2015-05-25 LAB — RENAL FUNCTION PANEL
ALBUMIN: 2.8 g/dL — AB (ref 3.5–5.0)
Anion gap: 5 (ref 5–15)
BUN: 36 mg/dL — AB (ref 6–20)
CALCIUM: 8.3 mg/dL — AB (ref 8.9–10.3)
CO2: 23 mmol/L (ref 22–32)
CREATININE: 2.12 mg/dL — AB (ref 0.44–1.00)
Chloride: 112 mmol/L — ABNORMAL HIGH (ref 101–111)
GFR calc Af Amer: 23 mL/min — ABNORMAL LOW (ref >60)
GFR, EST NON AFRICAN AMERICAN: 20 mL/min — AB (ref >60)
Glucose, Bld: 110 mg/dL — ABNORMAL HIGH (ref 65–99)
PHOSPHORUS: 2.4 mg/dL — AB (ref 2.5–4.6)
POTASSIUM: 4 mmol/L (ref 3.5–5.1)
SODIUM: 140 mmol/L (ref 135–145)

## 2015-05-25 MED ORDER — FLUTICASONE PROPIONATE 50 MCG/ACT NA SUSP: 2.0000 | Freq: Every evening | NASAL | Status: AC | PRN

## 2015-05-25 MED ORDER — CEPHALEXIN 500 MG PO CAPS
500.0000 mg | ORAL_CAPSULE | Freq: Three times a day (TID) | ORAL | Status: DC
Start: 2015-05-25 — End: 2015-07-13

## 2015-05-25 NOTE — Progress Notes (Signed)
[facility]  ROUNDING NOTE   Subjective:   Daughter at bedside this morning.   Objective:  Vital signs in last 24 hours:  Temp:  [97.5 F (36.4 C)-98.2 F (36.8 C)] 98.2 F (36.8 C) (03/29 0403) Pulse Rate:  [60-66] 66 (03/29 0403) Resp:  [16-17] 16 (03/29 0403) BP: (141-156)/(42-59) 156/46 mmHg (03/29 0403) SpO2:  [97 %-100 %] 97 % (03/29 0403)  Weight change:  Filed Weights   05/22/15 1431 05/22/15 1819  Weight: 58.968 kg (130 lb) 57.879 kg (127 lb 9.6 oz)    Intake/Output: I/O last 3 completed shifts: In: 4585.4 [P.O.:240; I.V.:4295.4; IV Piggyback:50] Out: 4450 [Urine:4450]   Intake/Output this shift:  Total I/O In: -  Out: 700 [Urine:700]  Physical Exam: General: NAD, laying in bed  Head: Normocephalic, atraumatic. Moist oral mucosal membranes  Eyes: Anicteric, PERRL  Neck: Supple, trachea midline  Lungs:  Clear to auscultation  Heart: Regular rate and rhythm  Abdomen:  Soft, nontender,   Extremities: no peripheral edema.  Neurologic: Nonfocal, moving all four extremities, alert to self, place and time  Skin: No lesions       Basic Metabolic Panel:  Recent Labs Lab 05/22/15 1501 05/23/15 0429 05/24/15 0807 05/25/15 0355  NA 130* 138 138 140  141  K 5.9* 4.6 4.2 4.0  4.0  CL 99* 105 112* 112*  112*  CO2 23 23 24 23  24   GLUCOSE 110* 95 104* 110*  109*  BUN 51* 51* 39* 36*  36*  CREATININE 4.44* 4.30* 3.04* 2.12*  2.13*  CALCIUM 9.3 8.3* 8.1* 8.3*  8.4*  PHOS  --   --   --  2.4*    Liver Function Tests:  Recent Labs Lab 05/22/15 1501 05/25/15 0355  AST 18  --   ALT 7*  --   ALKPHOS 93  --   BILITOT 0.7  --   PROT 6.6  --   ALBUMIN 3.7 2.8*   No results for input(s): LIPASE, AMYLASE in the last 168 hours. No results for input(s): AMMONIA in the last 168 hours.  CBC:  Recent Labs Lab 05/22/15 1501 05/23/15 0429  WBC 8.4 8.9  NEUTROABS 5.9  --   HGB 9.8* 8.8*  HCT 29.5* 26.5*  MCV 92.7 93.2  PLT 304  273    Cardiac Enzymes: No results for input(s): CKTOTAL, CKMB, CKMBINDEX, TROPONINI in the last 168 hours.  BNP: Invalid input(s): POCBNP  CBG: No results for input(s): GLUCAP in the last 168 hours.  Microbiology: Results for orders placed or performed during the hospital encounter of 05/22/15  Culture, blood (routine x 2)     Status: None (Preliminary result)   Collection Time: 05/22/15  3:01 PM  Result Value Ref Range Status   Specimen Description BLOOD LEFT ANTECUBITAL  Final   Special Requests BOTTLES DRAWN AEROBIC AND ANAEROBIC  3CC  Final   Culture NO GROWTH 2 DAYS  Final   Report Status PENDING  Incomplete  Culture, blood (routine x 2)     Status: None (Preliminary result)   Collection Time: 05/22/15  3:01 PM  Result Value Ref Range Status   Specimen Description BLOOD RIGHT ANTECUBITAL  Final   Special Requests BOTTLES DRAWN AEROBIC AND ANAEROBIC  2CC  Final   Culture NO GROWTH 2 DAYS  Final   Report Status PENDING  Incomplete  Urine culture     Status: None   Collection Time: 05/23/15 10:08 AM  Result Value Ref Range Status  Specimen Description URINE, RANDOM  Final   Special Requests NONE  Final   Culture 3,000 COLONIES/mL INSIGNIFICANT GROWTH  Final   Report Status 05/24/2015 FINAL  Final    Coagulation Studies: No results for input(s): LABPROT, INR in the last 72 hours.  Urinalysis:  Recent Labs  05/23/15 1003  COLORURINE AMBER*  LABSPEC 1.006  PHURINE 6.0  GLUCOSEU NEGATIVE  HGBUR NEGATIVE  BILIRUBINUR NEGATIVE  KETONESUR NEGATIVE  PROTEINUR NEGATIVE  NITRITE POSITIVE*  LEUKOCYTESUR 3+*      Imaging: No results found.   Medications:     . acidophilus  2 capsule Oral Daily  . allopurinol  100 mg Oral BID  . aspirin EC  81 mg Oral Daily  . atorvastatin  20 mg Oral Daily  . calcium-vitamin D  1 tablet Oral Daily  . cefTRIAXone (ROCEPHIN)  IV  1 g Intravenous Q24H  . ferrous sulfate  325 mg Oral Q breakfast  . gabapentin  300 mg Oral  BID  . heparin  5,000 Units Subcutaneous 3 times per day  . loratadine  10 mg Oral Daily  . multivitamin with minerals  1 tablet Oral Daily  . pantoprazole  40 mg Oral Daily  . sodium chloride flush  3 mL Intravenous Q12H  . ascorbic acid  1,000 mg Oral Daily   albuterol, fluticasone  Assessment/ Plan:  Joanne Michael is a 82 y.o. white  female with hypertension and degenerative joint disease, gout, C Diff colitis (2014), ARF, GERD  1. Acute Renal Failure with hyperkalemia and metabolic acidosis on chronic kidney disease stage IV: baseline creatinine 1.71, eGFR of 27 on 11/17/14.  Left kidney atrophy on imaging and nonobstructive stone.  Most likely due to prerenal azotemia.  - hold enalapril and furosemide. Do not discharge on these medications.   2. Urinary tract infection: e. Coli pan sensitive treated with Augmentin as outpatient. Afebrile and normal wbc -  Ceftriaxone empirically.  - urine culture with insignificant growth.   3. Hypertension: some hypotension: holding clonidine, enalapril and furosemide.   4. Anemia with chronic kidney disease: hemoglobin 8.8 - on iron supplements     LOS: [place], Barber 3/29/20179:25 AM

## 2015-05-25 NOTE — Discharge Instructions (Signed)

## 2015-05-25 NOTE — Progress Notes (Signed)
Patient received discharge instructions, pt verbalized understanding. IV was removed with no signs of infection. Dressing clean, dry intact. No skin tears or wounds present. Prescription was sent to pharmacy of choice. Patient was escorted out with staff member via wheelchair via private auto. No further needs from care management team.  

## 2015-05-26 NOTE — Discharge Summary (Signed)
[facility] [place] NAME: Joanne Michael    MR#:  [id number]  DATE OF BIRTH:  12-27-1924  DATE OF ADMISSION:  05/22/2015 ADMITTING PHYSICIAN: Dustin Flock, MD  DATE OF DISCHARGE: 05/25/2015  4:02 PM  PRIMARY CARE PHYSICIAN: Dion Body, MD   ADMISSION DIAGNOSIS:  Hyperkalemia [E87.5] Acute on chronic renal failure ([place]) [N17.9, N18.9] Cystitis [N30.90] Altered mental status, unspecified altered mental status type [R41.82]  DISCHARGE DIAGNOSIS:  Active Problems:   Acute renal failure (ARF) ([place])   SECONDARY DIAGNOSIS:   Past Medical History  Diagnosis Date  . HLD (hyperlipidemia)   . HTN (hypertension)   . Cat allergies   . Hay fever     as child  . GERD (gastroesophageal reflux disease)   . Arthritis   . Fatigue   . Iron deficiency anemia   . Chronic renal failure   . Spinal stenosis   . Gout   . H/O Clostridium difficile infection   . Hiatal hernia   . Cancer ([place])     melanoma     ADMITTING HISTORY  HISTORY OF PRESENT ILLNESS: Joanne Michael is a 82 y.o. female with a known history of hypertension, hyperlipidemia, GERD, chronic kidney disease, likely dementia who is brought in by her daughter due to worsening mental status and confusion. Her daughter reports the patient has had confusion over the past few months but is progressively worsens Friday. She also has been complaining of burning with urination. Patient was noted to have hyperkalemia also noticed to have worsening renal function. There is also currently concerned about a urinary tract infection however bladder scan showed no urine. Therefore no urinalysis is obtained. Patient not having any fevers or chills according to her daughter. Patient is very poor historian and is not answering any questions.  HOSPITAL COURSE:   82 year old female with essential hypertension and chronic renal failure who presents with acute encephalopathy.  1. Acute  encephalopathy Due to urinary tract infection and acute renal failure Patient mental status returned to baseline Continued IV fluid and Rocephin during hospital stay Urine cultures negative. Change to Keflex for 3 more days at discharge  2. Acute on chronic renal failure stage IV Baseline creatinine around 2 Imroved with IV fluids.  Discontinued Lasix and ACE inhibitor at discharge.  Creatinine on day of discharge is 2.17  3. Hyperkalemia: Resolved with treatment in the emergency room.  4. Essential hypertension:  Patient's blood pressure medications stop the time of discharge due to low normal blood pressure.  5. Hyperlipidemia: Continue Lipitor.  6. Patient had acute sinusitis for which she will use Zyrtec at home. Has Flonase available.  Stable for discharge home to follow-up with her primary care physician and nephrology as outpatient. Chest x-ray was done on the day of discharge and showed no infiltrate or pulmonary edema.  Discussed with daughter at bedside on day of discharge.   CONSULTS OBTAINED:  Treatment Team:  Lavonia Dana, MD  DRUG ALLERGIES:   Allergies  Allergen Reactions  . Codeine Nausea And Vomiting  . Hydrocodone-Acetaminophen Other (See Comments)    Hallucinates  . Iodine Swelling and Other (See Comments)    Patient is allergic to seafood and this is the cause why she can't take medication.  . Meperidine Hcl     Unknown reactions.  . Other Swelling    Patient allergic to seafood, tongue swelling and discoloration.  . Propoxyphene N-Acetaminophen Other (See Comments)    Unknown reaction  DISCHARGE MEDICATIONS:   Discharge Medication List as of 05/25/2015  3:51 PM    START taking these medications   Details  cephALEXin (KEFLEX) 500 MG capsule Take 1 capsule (500 mg total) by mouth 3 (three) times daily., Starting 05/25/2015, Until Discontinued, Normal      CONTINUE these medications which have CHANGED   Details  fluticasone (FLONASE) 50  MCG/ACT nasal spray Place 2 sprays into both nostrils at bedtime as needed for allergies or rhinitis., Starting 05/25/2015, Until Discontinued, Normal      CONTINUE these medications which have NOT CHANGED   Details  albuterol (PROVENTIL HFA;VENTOLIN HFA) 108 (90 BASE) MCG/ACT inhaler Inhale 1-2 puffs into the lungs every 6 (six) hours as needed for wheezing or shortness of breath., Starting 02/14/2015, Until Discontinued, Normal    allopurinol (ZYLOPRIM) 100 MG tablet Take 100 mg by mouth 2 (two) times daily., Until Discontinued, Historical Med    ascorbic acid (VITAMIN C) 1000 MG tablet Take 1,000 mg by mouth daily., Until Discontinued, Historical Med    aspirin EC 81 MG tablet Take 81 mg by mouth daily., Until Discontinued, Historical Med    atorvastatin (LIPITOR) 20 MG tablet Take 20 mg by mouth at bedtime. , Until Discontinued, Historical Med    Calcium Carbonate-Vitamin D (CALTRATE 600+D) 600-400 MG-UNIT per tablet Take 1 tablet by mouth daily.  , Until Discontinued, Historical Med    cetirizine (ZYRTEC) 10 MG chewable tablet Chew 10 mg by mouth daily.  , Until Discontinued, Historical Med    ferrous sulfate 325 (65 FE) MG tablet Take 325 mg by mouth daily with breakfast., Until Discontinued, Historical Med    gabapentin (NEURONTIN) 300 MG capsule Take 1 capsule (300 mg total) by mouth 2 (two) times daily., Starting 04/18/2015, Until Discontinued, Normal    Multiple Vitamin (MULTIVITAMIN) tablet Take 1 tablet by mouth daily., Until Discontinued, Historical Med    pantoprazole (PROTONIX) 40 MG tablet Take 1 tablet (40 mg total) by mouth daily., Starting 02/14/2015, Until Discontinued, Historical Med    Probiotic Product ([place]) Take 1 capsule by mouth daily., Until Discontinued, Historical Med    traMADol (ULTRAM) 50 MG tablet Take 1 tablet (50 mg total) by mouth every 8 (eight) hours as needed for moderate pain or severe pain., Starting 04/18/2015, Until  Discontinued, Print      STOP taking these medications     cloNIDine (CATAPRES) 0.2 MG tablet         Today   VITAL SIGNS:  Blood pressure 157/60, pulse 52, temperature 97.5 F (36.4 C), temperature source Oral, resp. rate 17, height 4\' 11"  (1.499 m), weight 57.879 kg (127 lb 9.6 oz), SpO2 100 %.  I/O:  No intake or output data in the 24 hours ending 05/26/15 1534  PHYSICAL EXAMINATION:  Physical Exam  GENERAL:  82 y.o.-year-old patient lying in the bed with no acute distress.  LUNGS: Normal breath sounds bilaterally, no wheezing, rales,rhonchi or crepitation. No use of accessory muscles of respiration.  CARDIOVASCULAR: S1, S2 normal. No murmurs, rubs, or gallops.  ABDOMEN: Soft, non-tender, non-distended. Bowel sounds present. No organomegaly or mass.  NEUROLOGIC: Moves all 4 extremities. PSYCHIATRIC: The patient is alert and awake SKIN: No obvious rash, lesion, or ulcer.   Frontal sinus tenderness  DATA REVIEW:   CBC  Recent Labs Lab 05/23/15 0429  WBC 8.9  HGB 8.8*  HCT 26.5*  PLT 273    Chemistries   Recent Labs Lab 05/22/15 1501  05/25/15 0355  NA 130*  < > 140  141  K 5.9*  < > 4.0  4.0  CL 99*  < > 112*  112*  CO2 23  < > 23  24  GLUCOSE 110*  < > 110*  109*  BUN 51*  < > 36*  36*  CREATININE 4.44*  < > 2.12*  2.13*  CALCIUM 9.3  < > 8.3*  8.4*  AST 18  --   --   ALT 7*  --   --   ALKPHOS 93  --   --   BILITOT 0.7  --   --   < > = values in this interval not displayed.  Cardiac Enzymes No results for input(s): TROPONINI in the last 168 hours.  Microbiology Results  Results for orders placed or performed during the hospital encounter of 05/22/15  Culture, blood (routine x 2)     Status: None (Preliminary result)   Collection Time: 05/22/15  3:01 PM  Result Value Ref Range Status   Specimen Description BLOOD LEFT ANTECUBITAL  Final   Special Requests BOTTLES DRAWN AEROBIC AND ANAEROBIC  3CC  Final   Culture NO GROWTH 4 DAYS   Final   Report Status PENDING  Incomplete  Culture, blood (routine x 2)     Status: None (Preliminary result)   Collection Time: 05/22/15  3:01 PM  Result Value Ref Range Status   Specimen Description BLOOD RIGHT ANTECUBITAL  Final   Special Requests BOTTLES DRAWN AEROBIC AND ANAEROBIC  2CC  Final   Culture NO GROWTH 4 DAYS  Final   Report Status PENDING  Incomplete  Urine culture     Status: None   Collection Time: 05/23/15 10:08 AM  Result Value Ref Range Status   Specimen Description URINE, RANDOM  Final   Special Requests NONE  Final   Culture 3,000 COLONIES/mL INSIGNIFICANT GROWTH  Final   Report Status 05/24/2015 FINAL  Final    RADIOLOGY:  Dg Chest 2 View  05/25/2015  CLINICAL DATA:  Shortness of breath EXAM: CHEST  2 VIEW COMPARISON:  Multiple prior studies including 05/22/2015 FINDINGS: Normal heart size. Uncoiling of the aorta. Normal vascular pattern. Chronic atelectasis medial left lung base with blunting costophrenic angle unchanged from prior study. IMPRESSION: No active cardiopulmonary disease. Electronically Signed   By: Skipper Cliche M.D.   On: 05/25/2015 14:05    Follow up with PCP in 1 week.  Management plans discussed with the patient, family and they are in agreement.  CODE STATUS:  Code Status History    Date Active Date Inactive Code Status Order ID Comments User Context   05/22/2015  5:57 PM 05/25/2015  7:02 PM Full Code [id number]  Dustin Flock, MD ED    Advance Directive Documentation        Most Recent Value   Type of Advance Directive  Healthcare Power of Attorney   Pre-existing out of facility DNR order (yellow form or pink MOST form)     "MOST" Form in Place?        TOTAL TIME TAKING CARE OF THIS PATIENT ON DAY OF DISCHARGE: more than 30 minutes.   Hillary Bow R M.D on 05/26/2015 at 3:34 PM  Between 7am to 6pm - Pager - [phone]  After 6pm go to [email or web address] - password [email or web address] [facility]  Office   [phone]  CC: Primary care physician; Dion Body, MD  Note: This dictation was prepared with Dragon dictation along with smaller phrase  technology. Any transcriptional errors that result from this process are unintentional.

## 2015-05-27 LAB — CULTURE, BLOOD (ROUTINE X 2)
CULTURE: NO GROWTH
Culture: NO GROWTH

## 2015-06-03 NOTE — Progress Notes (Signed)
[facility]  Patient Status: discharged  Daughter stated she does not feel the mother needs therapy. Episode closed. MD notified. .  If patient discharges after hours, please call [phone].   Joanne Michael 06/03/2015, 9:47 AM

## 2015-07-05 ENCOUNTER — Other Ambulatory Visit: Payer: Self-pay | Admitting: Nephrology

## 2015-07-05 ENCOUNTER — Ambulatory Visit
Admission: RE | Admit: 2015-07-05 | Discharge: 2015-07-05 | Disposition: A | Discharge status: Home / Self Care | Payer: Medicare Other | Source: Ambulatory Visit | Attending: Nephrology | Admitting: Nephrology

## 2015-07-05 DIAGNOSIS — N261 Atrophy of kidney (terminal): Secondary | ICD-10-CM | POA: Diagnosis not present

## 2015-07-05 DIAGNOSIS — N179 Acute kidney failure, unspecified: Secondary | ICD-10-CM | POA: Diagnosis present

## 2015-07-13 ENCOUNTER — Telehealth: Payer: Self-pay

## 2015-07-13 ENCOUNTER — Encounter: Payer: Self-pay | Admitting: Pain Medicine

## 2015-07-13 ENCOUNTER — Ambulatory Visit: Payer: Medicare Other | Attending: Pain Medicine | Admitting: Pain Medicine

## 2015-07-13 ENCOUNTER — Telehealth: Payer: Self-pay | Admitting: *Deleted

## 2015-07-13 VITALS — BP 127/91 | HR 57 | Temp 97.6°F | Resp 18 | Ht 62.0 in | Wt 125.0 lb

## 2015-07-13 DIAGNOSIS — R911 Solitary pulmonary nodule: Secondary | ICD-10-CM | POA: Insufficient documentation

## 2015-07-13 DIAGNOSIS — N3289 Other specified disorders of bladder: Secondary | ICD-10-CM | POA: Diagnosis not present

## 2015-07-13 DIAGNOSIS — M25552 Pain in left hip: Secondary | ICD-10-CM | POA: Insufficient documentation

## 2015-07-13 DIAGNOSIS — I350 Nonrheumatic aortic (valve) stenosis: Secondary | ICD-10-CM | POA: Diagnosis not present

## 2015-07-13 DIAGNOSIS — Z5181 Encounter for therapeutic drug level monitoring: Secondary | ICD-10-CM

## 2015-07-13 DIAGNOSIS — M533 Sacrococcygeal disorders, not elsewhere classified: Secondary | ICD-10-CM

## 2015-07-13 DIAGNOSIS — E782 Mixed hyperlipidemia: Secondary | ICD-10-CM | POA: Diagnosis not present

## 2015-07-13 DIAGNOSIS — I129 Hypertensive chronic kidney disease with stage 1 through stage 4 chronic kidney disease, or unspecified chronic kidney disease: Secondary | ICD-10-CM | POA: Diagnosis not present

## 2015-07-13 DIAGNOSIS — M47818 Spondylosis without myelopathy or radiculopathy, sacral and sacrococcygeal region: Secondary | ICD-10-CM

## 2015-07-13 DIAGNOSIS — M25551 Pain in right hip: Secondary | ICD-10-CM | POA: Diagnosis not present

## 2015-07-13 DIAGNOSIS — R600 Localized edema: Secondary | ICD-10-CM | POA: Insufficient documentation

## 2015-07-13 DIAGNOSIS — M160 Bilateral primary osteoarthritis of hip: Secondary | ICD-10-CM

## 2015-07-13 DIAGNOSIS — K219 Gastro-esophageal reflux disease without esophagitis: Secondary | ICD-10-CM | POA: Insufficient documentation

## 2015-07-13 DIAGNOSIS — G8929 Other chronic pain: Secondary | ICD-10-CM | POA: Diagnosis not present

## 2015-07-13 DIAGNOSIS — Z79891 Long term (current) use of opiate analgesic: Secondary | ICD-10-CM | POA: Diagnosis not present

## 2015-07-13 DIAGNOSIS — M545 Low back pain: Secondary | ICD-10-CM | POA: Diagnosis not present

## 2015-07-13 DIAGNOSIS — M792 Neuralgia and neuritis, unspecified: Secondary | ICD-10-CM | POA: Diagnosis not present

## 2015-07-13 DIAGNOSIS — M4806 Spinal stenosis, lumbar region: Secondary | ICD-10-CM | POA: Diagnosis not present

## 2015-07-13 DIAGNOSIS — M549 Dorsalgia, unspecified: Secondary | ICD-10-CM | POA: Diagnosis present

## 2015-07-13 DIAGNOSIS — R011 Cardiac murmur, unspecified: Secondary | ICD-10-CM | POA: Insufficient documentation

## 2015-07-13 DIAGNOSIS — M47816 Spondylosis without myelopathy or radiculopathy, lumbar region: Secondary | ICD-10-CM

## 2015-07-13 DIAGNOSIS — M25559 Pain in unspecified hip: Secondary | ICD-10-CM

## 2015-07-13 DIAGNOSIS — D509 Iron deficiency anemia, unspecified: Secondary | ICD-10-CM | POA: Diagnosis not present

## 2015-07-13 DIAGNOSIS — M810 Age-related osteoporosis without current pathological fracture: Secondary | ICD-10-CM | POA: Diagnosis not present

## 2015-07-13 DIAGNOSIS — M461 Sacroiliitis, not elsewhere classified: Secondary | ICD-10-CM

## 2015-07-13 DIAGNOSIS — M47817 Spondylosis without myelopathy or radiculopathy, lumbosacral region: Secondary | ICD-10-CM

## 2015-07-13 DIAGNOSIS — M4800 Spinal stenosis, site unspecified: Secondary | ICD-10-CM | POA: Diagnosis not present

## 2015-07-13 DIAGNOSIS — N189 Chronic kidney disease, unspecified: Secondary | ICD-10-CM | POA: Diagnosis not present

## 2015-07-13 DIAGNOSIS — M48061 Spinal stenosis, lumbar region without neurogenic claudication: Secondary | ICD-10-CM

## 2015-07-13 MED ORDER — OXYCODONE HCL 5 MG PO TABS: 2.5000 mg | ORAL_TABLET | Freq: Three times a day (TID) | ORAL | Status: DC | PRN

## 2015-07-13 NOTE — Telephone Encounter (Signed)
Left message with patients daughter.

## 2015-07-13 NOTE — Patient Instructions (Addendum)
Oxycodone tablets or capsules What is this medicine? OXYCODONE (ox i KOE done) is a pain reliever. It is used to treat moderate to severe pain. This medicine may be used for other purposes; ask your health care provider or pharmacist if you have questions. What should I tell my health care provider before I take this medicine? They need to know if you have any of these conditions: -Addison's disease -brain tumor -head injury -heart disease -history of drug or alcohol abuse problem -if you often drink alcohol -kidney disease -liver disease -lung or breathing disease, like asthma -mental illness -pancreatic disease -seizures -thyroid disease -an unusual or allergic reaction to oxycodone, codeine, hydrocodone, morphine, other medicines, foods, dyes, or preservatives -pregnant or trying to get pregnant -breast-feeding How should I use this medicine? Take this medicine by mouth with a glass of water. Follow the directions on the prescription label. You can take it with or without food. If it upsets your stomach, take it with food. Take your medicine at regular intervals. Do not take it more often than directed. Do not stop taking except on your doctor's advice. Some brands of this medicine, like Oxecta, have special instructions. Ask your doctor or pharmacist if these directions are for you: Do not cut, crush or chew this medicine. Swallow only one tablet at a time. Do not wet, soak, or lick the tablet before you take it. Talk to your pediatrician regarding the use of this medicine in children. Special care may be needed. Overdosage: If you think you have taken too much of this medicine contact a poison control center or emergency room at once. NOTE: This medicine is only for you. Do not share this medicine with others. What if I miss a dose? If you miss a dose, take it as soon as you can. If it is almost time for your next dose, take only that dose. Do not take double or extra doses. What  may interact with this medicine? -alcohol -antihistamines -certain medicines used for nausea like chlorpromazine, droperidol -erythromycin -ketoconazole -medicines for depression, anxiety, or psychotic disturbances -medicines for sleep -muscle relaxants -naloxone -naltrexone -narcotic medicines (opiates) for pain -nilotinib -phenobarbital -phenytoin -rifampin -ritonavir -voriconazole This list may not describe all possible interactions. Give your health care provider a list of all the medicines, herbs, non-prescription drugs, or dietary supplements you use. Also tell them if you smoke, drink alcohol, or use illegal drugs. Some items may interact with your medicine. What should I watch for while using this medicine? Tell your doctor or health care professional if your pain does not go away, if it gets worse, or if you have new or a different type of pain. You may develop tolerance to the medicine. Tolerance means that you will need a higher dose of the medicine for pain relief. Tolerance is normal and is expected if you take this medicine for a long time. Do not suddenly stop taking your medicine because you may develop a severe reaction. Your body becomes used to the medicine. This does NOT mean you are addicted. Addiction is a behavior related to getting and using a drug for a non-medical reason. If you have pain, you have a medical reason to take pain medicine. Your doctor will tell you how much medicine to take. If your doctor wants you to stop the medicine, the dose will be slowly lowered over time to avoid any side effects. You may get drowsy or dizzy when you first start taking this medicine or change doses.  Do not drive, use machinery, or do anything that may be dangerous until you know how the medicine affects you. Stand or sit up slowly. There are different types of narcotic medicines (opiates) for pain. If you take more than one type at the same time, you may have more side effects.  Give your health care provider a list of all medicines you use. Your doctor will tell you how much medicine to take. Do not take more medicine than directed. Call emergency for help if you have problems breathing. This medicine will cause constipation. Try to have a bowel movement at least every 2 to 3 days. If you do not have a bowel movement for 3 days, call your doctor or health care professional. Your mouth may get dry. Drinking water, chewing sugarless gum, or sucking on hard candy may help. See your dentist every 6 months. What side effects may I notice from receiving this medicine? Side effects that you should report to your doctor or health care professional as soon as possible: -allergic reactions like skin rash, itching or hives, swelling of the face, lips, or tongue -breathing problems -confusion -feeling faint or lightheaded, falls -trouble passing urine or change in the amount of urine -unusually weak or tired Side effects that usually do not require medical attention (report to your doctor or health care professional if they continue or are bothersome): -constipation -dry mouth -itching -nausea, vomiting -upset stomach This list may not describe all possible side effects. Call your doctor for medical advice about side effects. You may report side effects to FDA at 1-800-FDA-1088. Where should I keep my medicine? Keep out of the reach of children. This medicine can be abused. Keep your medicine in a safe place to protect it from theft. Do not share this medicine with anyone. Selling or giving away this medicine is dangerous and against the law. Store at room temperature between 15 and 30 degrees C (59 and 86 degrees F). Protect from light. Keep container tightly closed. This medicine may cause accidental overdose and death if it is taken by other adults, children, or pets. Flush any unused medicine down the toilet to reduce the chance of harm. Do not use the medicine after the  expiration date. NOTE: This sheet is a summary. It may not cover all possible information. If you have questions about this medicine, talk to your doctor, pharmacist, or health care provider.    2016, Elsevier/Gold Standard. (2014-06-26 01:15:14)    HAVE SOMEONE WITH YOU WHEN YOU FIRST START TAKING OXYCODONE. TO SEE IF YOU CAN TOLERATED IT.  GO DOWNSTAIRS TO MEDICAL MALL AND GET XRAYS.  tAKE THE OXYCODONE 1/2 TO 1 TABLET EVERY 8 HOURS IF NEEDED.  BE CAREFUL WALKING.  MAKE SURE SOMEONE IS THERE TO HELP YOU.  PATIENT STATES HER SON IN LAW IS AT HOME ALL THE TIME.

## 2015-07-13 NOTE — Progress Notes (Signed)
Safety precautions to be maintained throughout the outpatient stay will include: orient to surroundings, keep bed in low position, maintain call bell within reach at all times, provide assistance with transfer out of bed and ambulation.   Did not bring medication bottle to appointment.

## 2015-07-13 NOTE — Progress Notes (Signed)
Recently felt a "pop" in the lower back, causing an increase in pain.

## 2015-07-13 NOTE — Telephone Encounter (Signed)
Message left with patient's daughter re; the message that patient could not take Oxycodone.  Dr Dossie Arbour is aware that oxycodone is on her allergy lis as causing nausea and vomitting which is a side affect and not an allergy.  Per Dr Dossie Arbour, he would like patient to try the medication and begin slowly and see how they affect her.  Advised to take with food. If patient has a problem with the medication, stop taking and let us know.

## 2015-07-13 NOTE — Progress Notes (Deleted)
   Subjective:    Patient ID: Joanne Michael, female    DOB: [birth year], 82 y.o.   MRN: [id number]  HPI    Review of Systems     Objective:   Physical Exam        Assessment & Plan:

## 2015-07-13 NOTE — Telephone Encounter (Signed)
Pts daughter called to say she cant have oxycodone Pts daughter would like someone to call her [phone]

## 2015-07-13 NOTE — Progress Notes (Signed)
Patient's Name: Joanne Michael  Patient type: Established  MRN: [id number]  Service setting: Ambulatory outpatient  DOB: 01-Jun-1924  Location: [facility]  DOS: 07/13/2015  Primary Care Physician: Dion Body, MD  Note by: Kathlen Brunswick. Dossie Arbour, M.D, DABA, DABAPM, DABPM, Milagros Evener, [place]  Referring Physician: Dion Body, MD  Specialty: Board-Certified Interventional Pain Management  Last Visit to Pain Management: 04/18/2015   Primary Reason(s) for Visit: Encounter for prescription drug management (Level of risk: moderate) CC: Back Pain   HPI  Joanne Michael is a 82 y.o. year old, female patient, who returns today as an established patient. 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 [facility]); Combined fat and carbohydrate induced hyperlipemia; Anemia, iron deficiency; Heart murmur, systolic; Arthritis urica; Gastro-esophageal reflux disease without esophagitis; Essential (primary) hypertension; Chronic kidney disease, stage IV (severe) ([place]); Back ache; Chronic low back pain (Location of Primary Source of Pain) (Bilateral) (R>L); Lumbar facet syndrome (Location of Primary Source of Pain) (Bilateral) (R>L); Lumbar spondylosis; Chronic pain; Long term current use of opiate analgesic; Long term prescription opiate use; Opiate use; Encounter for therapeutic drug level monitoring; Encounter for chronic pain management; Chronic lower extremity pain (referred) (Location of Secondary source of pain) (Bilateral) (R>L); Neurogenic pain; Kidney failure; Acute renal failure (ARF) ([place]); Chronic hip pain (Bilateral); Posterior arthritis of hip (Bilateral); Chronic  sacroiliac joint pain (Bilateral); Osteoarthritis of sacroiliac joint (Bilateral); and Lumbar spinal stenosis on her problem list.. Her primarily concern today is the Back Pain   Pain Assessment: Self-Reported Pain Score: 8 , clinically she looks like a 3-4/10. Reported level is inconsistent with clinical obrservations. Today the patient was provided with information with regards to the pain score. Pain Type: Chronic pain Pain Location: Back Pain Orientation: Lower Pain Descriptors / Indicators: Sharp Pain Frequency: Constant  The patient comes into the clinics today for pharmacological management of her chronic pain. I last saw this patient on 04/18/2015. The patient  reports that she does not use illicit drugs. Her body mass index is 22.86 kg/(m^2).Marland Kitchen The patient indicates that she is having a lot more pain than usual and her medications are not really taking care of the pain. She was wondering if she had to do a "Drug Holiday", but in reality I rather have her off of the tramadol and use a more pure opioid such as oxycodone. The only concern that I have is that she is a high risk for falls and therefore I will start very conservatively in terms of the dose. I will give her a prescription for oxycodone IR 5 mg to use half to 1 pill Q8 hours when necessary for pain. We have instructed the patient to be very careful when she starts taking the medication and to do it when there is somebody else presence of that if she has any side effects she will have somebody that can assist her. If everything goes right, I will see her back in 2 weeks for reevaluation of this trial and to review the x-rays that I will be ordering for her today.  Date of Last Visit: 04/18/15 Service Provided on Last Visit: Med Refill  Controlled Substance Pharmacotherapy Assessment & REMS (Risk Evaluation and Mitigation Strategy)  Analgesic: Tramadol 50 mg 1 tablet by mouth every 8 hours when necessary  for pain (150 mg/day) Pill  Count: Did not bring medication bottle to appointment. The patient was reminded that she needs to bring her medications and/or empty bottles to all of her medication appointment. MME/day: 15 mg/day Pharmacokinetics: Onset of action (Liberation/Absorption): Within expected pharmacological parameters Time to Peak effect (Distribution): Timing and results are as within normal expected parameters Duration of action (Metabolism/Excretion): Within normal limits for medication Pharmacodynamics: Analgesic Effect: More than 50% Activity Facilitation: Medication(s) allow patient to sit, stand, walk, and do the basic ADLs Perceived Effectiveness: Described as relatively effective, allowing for increase in activities of daily living (ADL) Side-effects or Adverse reactions: None reported Monitoring: [place] PMP: Online review of the past 80-month period conducted. Compliant with practice rules and regulations UDS Results/interpretation: The patient's last UDS was done on 04/18/2015 and it came back with unexpected results, however, when you look at them it is clear that there was a clinical ever since the patient does take the tramadol which apparently was not shared with the lab. Therefore, the results are compliant. Medication Assessment Form: Reviewed. Patient indicates being compliant with therapy Treatment compliance: Compliant Risk Assessment: Aberrant Behavior: None observed today Substance Use Disorder (SUD) Risk Level: Low Risk of opioid abuse or dependence: 0.7-3.0% with doses ? 36 MME/day and 6.1-26% with doses ? 120 MME/day. Opioid Risk Tool (ORT) Score: Total Score: 0 Low Risk for SUD (Score <3) Depression Scale Score: PHQ-2: PHQ-2 Total Score: 0 No depression (0) PHQ-9: PHQ-9 Total Score: 0 No depression (0-4)  Pharmacologic Plan: No change in therapy, at this time  Laboratory Chemistry  Inflammation Markers Lab Results  Component Value Date   ESRSEDRATE 63* 04/18/2015   CRP <0.5  04/18/2015    Renal Function Lab Results  Component Value Date   BUN 36* 05/25/2015   BUN 36* 05/25/2015   CREATININE 2.12* 05/25/2015   CREATININE 2.13* 05/25/2015   GFRAA 23* 05/25/2015   GFRAA 23* 05/25/2015   GFRNONAA 20* 05/25/2015   GFRNONAA 20* 05/25/2015    Hepatic Function Lab Results  Component Value Date   AST 18 05/22/2015   ALT 7* 05/22/2015   ALBUMIN 2.8* 05/25/2015    Electrolytes Lab Results  Component Value Date   NA 140 05/25/2015   NA 141 05/25/2015   K 4.0 05/25/2015   K 4.0 05/25/2015   CL 112* 05/25/2015   CL 112* 05/25/2015   CALCIUM 8.3* 05/25/2015   CALCIUM 8.4* 05/25/2015   MG 2.1 04/18/2015    Pain Modulating Vitamins No results found for: VD25OH, VD125OH2TOT, [id number], [id number], VITAMINB12  Coagulation Parameters Lab Results  Component Value Date   INR 0.9 09/23/2013   LABPROT 11.9 09/23/2013    Note: I personally reviewed the above data. Results made available to patient.  Recent Diagnostic Imaging  US Renal  07/05/2015  CLINICAL DATA:  Acute renal failure. These results will be called to the ordering clinician or representative by the Radiology Department at the imaging location. EXAM: RENAL / URINARY TRACT ULTRASOUND COMPLETE COMPARISON:  CT 05/22/2015 FINDINGS: Right Kidney: Length: 9 cm with smooth cortex. Echogenicity within normal limits. No mass or hydronephrosis visualized. Left Kidney: Length: 8 cm with smooth cortex. Echogenicity within normal limits. No mass or hydronephrosis visualized. Bladder: Appears normal for degree of bladder distention. A left ureteral jet was seen. IMPRESSION: 1. No hydronephrosis or other acute finding. 2. Renal atrophy, worse on the left. Electronically Signed   By: Monte Fantasia M.D.   On: 07/05/2015 12:15    Meds  The patient has a current medication list which includes the following prescription(s): albuterol, allopurinol, ascorbic acid, aspirin ec, atorvastatin, calcium carbonate-vitamin  d, cetirizine, ferrous sulfate, fluticasone, gabapentin, multivitamin, pantoprazole, probiotic product, and oxycodone.  Current Outpatient Prescriptions on File Prior to Visit  Medication Sig  . albuterol (PROVENTIL HFA;VENTOLIN HFA) 108 (90 BASE) MCG/ACT inhaler Inhale 1-2 puffs into the lungs every 6 (six) hours as needed for wheezing or shortness of breath.  . allopurinol (ZYLOPRIM) 100 MG tablet Take 100 mg by mouth 2 (two) times daily.  Marland Kitchen ascorbic acid (VITAMIN C) 1000 MG tablet Take 1,000 mg by mouth daily.  Marland Kitchen aspirin EC 81 MG tablet Take 81 mg by mouth daily.  Marland Kitchen atorvastatin (LIPITOR) 20 MG tablet Take 20 mg by mouth at bedtime.   . Calcium Carbonate-Vitamin D (CALTRATE 600+D) 600-400 MG-UNIT per tablet Take 1 tablet by mouth daily.    . cetirizine (ZYRTEC) 10 MG chewable tablet Chew 10 mg by mouth daily.    . ferrous sulfate 325 (65 FE) MG tablet Take 325 mg by mouth daily with breakfast.  . fluticasone (FLONASE) 50 MCG/ACT nasal spray Place 2 sprays into both nostrils at bedtime as needed for allergies or rhinitis.  Marland Kitchen gabapentin (NEURONTIN) 300 MG capsule Take 1 capsule (300 mg total) by mouth 2 (two) times daily.  . Multiple Vitamin (MULTIVITAMIN) tablet Take 1 tablet by mouth daily.  . pantoprazole (PROTONIX) 40 MG tablet Take 1 tablet (40 mg total) by mouth daily.  . Probiotic Product (PHILLIPS COLON HEALTH PO) Take 1 capsule by mouth daily.   No current facility-administered medications on file prior to visit.    ROS  Constitutional: Denies any fever or chills Gastrointestinal: No reported hemesis, hematochezia, vomiting, or acute GI distress Musculoskeletal: Denies any acute onset joint swelling, redness, loss of ROM, or weakness Neurological: No reported episodes of acute onset apraxia, aphasia, dysarthria, agnosia, amnesia, paralysis, loss of coordination, or loss of consciousness  Allergies  Ms. Basil is allergic to codeine; hydrocodone-acetaminophen; iodine;  meperidine hcl; other; and propoxyphene n-acetaminophen.  [place]  Medical:  Ms. Wysocki  has a past medical history of HLD (hyperlipidemia); HTN (hypertension); Cat allergies; Hay fever; GERD (gastroesophageal reflux disease); Arthritis; Fatigue; Iron deficiency anemia; Chronic renal failure; Spinal stenosis; Gout; H/O Clostridium difficile infection; Hiatal hernia; and Cancer ([place]). Family: family history includes Heart failure in her father; Liver cancer in her father; Lung cancer in her brother and sister; Ovarian cancer in her mother; Prostate cancer in her brother. Surgical:  has past surgical history that includes Cataract extraction; Tonsillectomy; Appendectomy; Total abdominal hysterectomy; knee replacement- both; Cholecystectomy; Nose surgery; Shoulder surgery; feet surgery; Back surgery; Hernia repair; Rotator cuff repair; and Melanoma excision. Tobacco:  reports that she has never smoked. She has never used smokeless tobacco. Alcohol:  reports that she does not drink alcohol. Drug:  reports that she does not use illicit drugs.  Constitutional Exam  Vitals: Blood pressure 127/91, pulse 57, temperature 97.6 F (36.4 C), temperature source Oral, resp. rate 18, height 5\' 2"  (1.575 m), weight 125 lb (56.7 kg), SpO2 99 %. General appearance: Well nourished, well developed, and well hydrated. In no acute distress Calculated BMI/Body habitus: Body mass index is 22.86 kg/(m^2). (18.5-24.9 kg/m2) Ideal body weight Psych/Mental status: Alert and oriented x 3 (person, place, & time) Eyes: PERLA Respiratory: No evidence of acute respiratory distress  Cervical Spine Exam  Inspection: No masses, redness, or swelling Alignment: Symmetrical ROM: Functional: ROM is within functional limits [facility]) Stability:  No instability detected Muscle strength & Tone: Functionally intact Sensory: Unimpaired Palpation: No complaints of tenderness  Upper Extremity (UE) Exam    Side: Right upper extremity   Side: Left upper extremity  Inspection: No masses, redness, swelling, or asymmetry  Inspection: No masses, redness, swelling, or asymmetry  ROM:  ROM:  Functional: ROM is within functional limits [facility])  Functional: ROM is within functional limits [facility])  Muscle strength & Tone: Functionally intact  Muscle strength & Tone: Functionally intact  Sensory: Unimpaired  Sensory: Unimpaired  Palpation: Non-contributory  Palpation: Non-contributory   Thoracic Spine Exam  Inspection: No masses, redness, or swelling Alignment: Symmetrical ROM: Functional: ROM is within functional limits [facility]) Stability: No instability detected Sensory: Unimpaired Muscle strength & Tone: Functionally intact Palpation: No complaints of tenderness  Lumbar Spine Exam  Inspection: No masses, redness, or swelling Alignment: Symmetrical ROM: Functional: Limited ROM Stability: No instability detected Muscle strength & Tone: Functionally intact Sensory: Unimpaired Palpation: Tender Provocative Tests: Lumbar Hyperextension and rotation test: Positive bilaterally for lumbar facet pain Patrick's Maneuver: Positive bilaterally for hip joint pain and sacroiliac joint pain.  Gait & Posture Assessment  Ambulation: Unassisted Gait: Shuffling gait Posture: Antalgic  Lower Extremity Exam    Side: Right lower extremity  Side: Left lower extremity  Inspection: No masses, redness, swelling, or asymmetry ROM:  Inspection: No masses, redness, swelling, or asymmetry ROM:  Functional: ROM is within functional limits [facility])  Functional: ROM is within functional limits [facility])  Muscle strength & Tone: Functionally intact  Muscle strength & Tone: Functionally intact  Sensory: Unimpaired  Sensory: Unimpaired  Palpation: Non-contributory  Palpation: Non-contributory   Assessment & Plan  Primary Diagnosis & Pertinent Problem List: The primary encounter diagnosis was Chronic low back pain (Location of Primary Source of Pain)  (Bilateral) (R>L). Diagnoses of Lumbar spondylosis, unspecified spinal osteoarthritis, Lumbar facet syndrome (Location of Primary Source of Pain) (Bilateral) (R>L), Neurogenic pain, Long term current use of opiate analgesic, Encounter for therapeutic drug level monitoring, Chronic hip pain, unspecified laterality, Primary osteoarthritis of both hips, Chronic sacroiliac joint pain (Bilateral), Osteoarthritis of sacroiliac joint (Bilateral), Lumbar spinal stenosis, and Chronic pain were also pertinent to this visit.  Visit Diagnosis: 1. Chronic low back pain (Location of Primary Source of Pain) (Bilateral) (R>L)   2. Lumbar spondylosis, unspecified spinal osteoarthritis   3. Lumbar facet syndrome (Location of Primary Source of Pain) (Bilateral) (R>L)   4. Neurogenic pain   5. Long term current use of opiate analgesic   6. Encounter for therapeutic drug level monitoring   7. Chronic hip pain, unspecified laterality   8. Primary osteoarthritis of both hips   9. Chronic sacroiliac joint pain (Bilateral)   10. Osteoarthritis of sacroiliac joint (Bilateral)   11. Lumbar spinal stenosis   12. Chronic pain     Problems updated and reviewed during this visit: Problem  Chronic hip pain (Bilateral)  Posterior arthritis of hip (Bilateral)  Chronic sacroiliac joint pain (Bilateral)  Osteoarthritis of sacroiliac joint (Bilateral)  Lumbar Spinal Stenosis  Spinal Stenosis   Overview:  Due to herinated disc by MRI   Arthritis, Degenerative  Back Ache  Heart Murmur, Systolic    Problem-specific Plan(s): No problem-specific assessment & plan notes found for this encounter.  No new assessment & plan notes have been filed under this hospital service since the last note was generated. Service: Pain Management   Plan of Care   Problem List Items Addressed This Visit      High  Chronic hip pain (Bilateral) (Chronic)   Relevant Medications   oxyCODONE (OXY IR/ROXICODONE) 5 MG immediate release  tablet   Other Relevant Orders   DG HIP UNILAT W OR W/O PELVIS 2-3 VIEWS LEFT   DG HIP UNILAT W OR W/O PELVIS 2-3 VIEWS RIGHT   Chronic low back pain (Location of Primary Source of Pain) (Bilateral) (R>L) - Primary (Chronic)   Relevant Medications   oxyCODONE (OXY IR/ROXICODONE) 5 MG immediate release tablet   Other Relevant Orders   DG Lumbar Spine Complete W/Bend   Chronic pain (Chronic)   Relevant Medications   oxyCODONE (OXY IR/ROXICODONE) 5 MG immediate release tablet   Chronic sacroiliac joint pain (Bilateral) (Chronic)   Relevant Medications   oxyCODONE (OXY IR/ROXICODONE) 5 MG immediate release tablet   Other Relevant Orders   DG Si Joints   Lumbar facet syndrome (Location of Primary Source of Pain) (Bilateral) (R>L) (Chronic)   Relevant Medications   oxyCODONE (OXY IR/ROXICODONE) 5 MG immediate release tablet   Lumbar spinal stenosis (Chronic)   Relevant Orders   MR Lumbar Spine Wo Contrast   Lumbar spondylosis (Chronic)   Relevant Medications   oxyCODONE (OXY IR/ROXICODONE) 5 MG immediate release tablet   Neurogenic pain (Chronic)   Osteoarthritis of sacroiliac joint (Bilateral) (Chronic)   Relevant Medications   oxyCODONE (OXY IR/ROXICODONE) 5 MG immediate release tablet   Other Relevant Orders   DG Si Joints   Posterior arthritis of hip (Bilateral) (Chronic)   Relevant Medications   oxyCODONE (OXY IR/ROXICODONE) 5 MG immediate release tablet   Other Relevant Orders   DG HIP UNILAT W OR W/O PELVIS 2-3 VIEWS LEFT   DG HIP UNILAT W OR W/O PELVIS 2-3 VIEWS RIGHT     Medium   Encounter for therapeutic drug level monitoring   Long term current use of opiate analgesic (Chronic)       Pharmacotherapy (Medications Ordered): Meds ordered this encounter  Medications  . oxyCODONE (OXY IR/ROXICODONE) 5 MG immediate release tablet    Sig: Take 0.5-1 tablets (2.5-5 mg total) by mouth every 8 (eight) hours as needed for severe pain.    Dispense:  42 tablet    Refill:  0     Do not place this medication, or any other prescription from our practice, on "Automatic Refill". Patient may have prescription filled one day early if pharmacy is closed on scheduled refill date. Do not fill until: 07/13/15 To last until: 07/27/15    [facility] & Procedure Ordered: Orders Placed This Encounter  Procedures  . DG Lumbar Spine Complete W/Bend  . MR Lumbar Spine Wo Contrast  . DG Si Joints  . DG HIP UNILAT W OR W/O PELVIS 2-3 VIEWS LEFT  . DG HIP UNILAT W OR W/O PELVIS 2-3 VIEWS RIGHT    Imaging Ordered: DG LUMBAR SPINE COMPLETE W/BEND 6+V MR LUMBAR SPINE WO CONTRAST DG SI JOINTS DG HIP UNILAT W OR W/O PELVIS 2-3 VIEWS LEFT DG HIP UNILAT W OR W/O PELVIS 2-3 VIEWS RIGHT  Interventional Therapies: Scheduled:  None at this point    Considering:   1. Diagnostic bilateral hip joint injection under fluoroscopy, without sedation.  2. Diagnostic bilateral sacroiliac joint injection under fluoroscopic guidance, no sedation.  3. Diagnostic bilateral lumbar facet block under fluoroscopic guidance with IV sedation.  4. Possible palliative lumbar epidural steroid injection under fluoroscopic guidance, no sedation.    PRN Procedures:  None at this point.    Referral(s) or Consult(s): None at this time.  New Prescriptions   OXYCODONE (OXY IR/ROXICODONE) 5 MG IMMEDIATE RELEASE TABLET    Take 0.5-1 tablets (2.5-5 mg total) by mouth every 8 (eight) hours as needed for severe pain.    Medications administered during this visit: Ms. Kapla had no medications administered during this visit.  Requested PM Follow-up: Return in about 12 days (around 07/25/2015) for Eval s/p Med-Adjustment.  Future Appointments Date Time Provider [place]  07/26/2015 1:40 PM Milinda Pointer, MD [facility] None  08/15/2015 11:40 AM Minna Merritts, MD [facility]  10/13/2015 10:00 AM [facility]  10/17/2015 10:00 AM Lloyd Huger, MD [facility] None     Primary Care Physician: Dion Body, MD Location: [facility] Note by: Kathlen Brunswick Dossie Arbour, M.D, DABA, DABAPM, DABPM, DABIPP, FIPP  Pain Score Disclaimer: We use the NRS-11 scale. This is a self-reported, subjective measurement of pain severity with only modest accuracy. It is used primarily to identify changes within a particular patient. It must be understood that outpatient pain scales are significantly less accurate that those used for research, where they can be applied under ideal controlled circumstances with minimal exposure to variables. In reality, the score is likely to be a combination of pain intensity and pain affect, where pain affect describes the degree of emotional arousal or changes in action readiness caused by the sensory experience of pain. Factors such as social and work situation, setting, emotional state, anxiety levels, expectation, and prior pain experience may influence pain perception and show large inter-individual differences that may also be affected by time variables.  Patient instructions provided during this appointment: Patient Instructions  Oxycodone tablets or capsules What is this medicine? OXYCODONE (ox i KOE done) is a pain reliever. It is used to treat moderate to severe pain. This medicine may be used for other purposes; ask your health care provider or pharmacist if you have questions. What should I tell my health care provider before I take this medicine? They need to know if you have any of these conditions: -Addison's disease -brain tumor -head injury -heart disease -history of drug or alcohol abuse problem -if you often drink alcohol -kidney disease -liver disease -lung or breathing disease, like asthma -mental illness -pancreatic disease -seizures -thyroid disease -an unusual or allergic reaction to oxycodone, codeine, hydrocodone, morphine, other medicines, foods, dyes, or preservatives -pregnant  or trying to get pregnant -breast-feeding How should I use this medicine? Take this medicine by mouth with a glass of water. Follow the directions on the prescription label. You can take it with or without food. If it upsets your stomach, take it with food. Take your medicine at regular intervals. Do not take it more often than directed. Do not stop taking except on your doctor's advice. Some brands of this medicine, like Oxecta, have special instructions. Ask your doctor or pharmacist if these directions are for you: Do not cut, crush or chew this medicine. Swallow only one tablet at a time. Do not wet, soak, or lick the tablet before you take it. Talk to your pediatrician regarding the use of this medicine in children. Special care may be needed. Overdosage: If you think you have taken too much of this medicine contact a poison control center or emergency room at once. NOTE: This medicine is only for you. Do not share this medicine with others. What if I miss a dose? If you miss a dose, take it as soon as you can. If it is almost time for your next  dose, take only that dose. Do not take double or extra doses. What may interact with this medicine? -alcohol -antihistamines -certain medicines used for nausea like chlorpromazine, droperidol -erythromycin -ketoconazole -medicines for depression, anxiety, or psychotic disturbances -medicines for sleep -muscle relaxants -naloxone -naltrexone -narcotic medicines (opiates) for pain -nilotinib -phenobarbital -phenytoin -rifampin -ritonavir -voriconazole This list may not describe all possible interactions. Give your health care provider a list of all the medicines, herbs, non-prescription drugs, or dietary supplements you use. Also tell them if you smoke, drink alcohol, or use illegal drugs. Some items may interact with your medicine. What should I watch for while using this medicine? Tell your doctor or health care professional if your pain  does not go away, if it gets worse, or if you have new or a different type of pain. You may develop tolerance to the medicine. Tolerance means that you will need a higher dose of the medicine for pain relief. Tolerance is normal and is expected if you take this medicine for a long time. Do not suddenly stop taking your medicine because you may develop a severe reaction. Your body becomes used to the medicine. This does NOT mean you are addicted. Addiction is a behavior related to getting and using a drug for a non-medical reason. If you have pain, you have a medical reason to take pain medicine. Your doctor will tell you how much medicine to take. If your doctor wants you to stop the medicine, the dose will be slowly lowered over time to avoid any side effects. You may get drowsy or dizzy when you first start taking this medicine or change doses. Do not drive, use machinery, or do anything that may be dangerous until you know how the medicine affects you. Stand or sit up slowly. There are different types of narcotic medicines (opiates) for pain. If you take more than one type at the same time, you may have more side effects. Give your health care provider a list of all medicines you use. Your doctor will tell you how much medicine to take. Do not take more medicine than directed. Call emergency for help if you have problems breathing. This medicine will cause constipation. Try to have a bowel movement at least every 2 to 3 days. If you do not have a bowel movement for 3 days, call your doctor or health care professional. Your mouth may get dry. Drinking water, chewing sugarless gum, or sucking on hard candy may help. See your dentist every 6 months. What side effects may I notice from receiving this medicine? Side effects that you should report to your doctor or health care professional as soon as possible: -allergic reactions like skin rash, itching or hives, swelling of the face, lips, or  tongue -breathing problems -confusion -feeling faint or lightheaded, falls -trouble passing urine or change in the amount of urine -unusually weak or tired Side effects that usually do not require medical attention (report to your doctor or health care professional if they continue or are bothersome): -constipation -dry mouth -itching -nausea, vomiting -upset stomach This list may not describe all possible side effects. Call your doctor for medical advice about side effects. You may report side effects to FDA at 1-800-FDA-1088. Where should I keep my medicine? Keep out of the reach of children. This medicine can be abused. Keep your medicine in a safe place to protect it from theft. Do not share this medicine with anyone. Selling or giving away this medicine is dangerous and against  the law. Store at room temperature between 15 and 30 degrees C (59 and 86 degrees F). Protect from light. Keep container tightly closed. This medicine may cause accidental overdose and death if it is taken by other adults, children, or pets. Flush any unused medicine down the toilet to reduce the chance of harm. Do not use the medicine after the expiration date. NOTE: This sheet is a summary. It may not cover all possible information. If you have questions about this medicine, talk to your doctor, pharmacist, or health care provider.    2016, [employer]. (2014-06-26 01:15:14)    HAVE SOMEONE WITH YOU WHEN YOU FIRST START TAKING OXYCODONE. TO SEE IF YOU CAN TOLERATED IT.  GO DOWNSTAIRS TO MEDICAL MALL AND GET XRAYS.  tAKE THE OXYCODONE 1/2 TO 1 TABLET EVERY 8 HOURS IF NEEDED.  BE CAREFUL WALKING.  MAKE SURE SOMEONE IS THERE TO HELP YOU.  PATIENT STATES HER SON IN LAW IS AT HOME ALL THE TIME.

## 2015-07-14 ENCOUNTER — Telehealth: Payer: Self-pay | Admitting: *Deleted

## 2015-07-14 NOTE — Telephone Encounter (Signed)
Returned pt daughter call. Daughter stated that the meds that Dr. Dossie Arbour prescribed yesterday her mother can not take it. Shes high risk fall etc. Daughter would like for someone to give her a call...thanks

## 2015-07-15 ENCOUNTER — Ambulatory Visit
Admission: RE | Admit: 2015-07-15 | Discharge: 2015-07-15 | Disposition: A | Discharge status: Home / Self Care | Payer: Medicare Other | Source: Ambulatory Visit | Attending: Pain Medicine | Admitting: Pain Medicine

## 2015-07-15 DIAGNOSIS — M47818 Spondylosis without myelopathy or radiculopathy, sacral and sacrococcygeal region: Secondary | ICD-10-CM

## 2015-07-15 DIAGNOSIS — M533 Sacrococcygeal disorders, not elsewhere classified: Secondary | ICD-10-CM | POA: Insufficient documentation

## 2015-07-15 DIAGNOSIS — M545 Low back pain, unspecified: Secondary | ICD-10-CM

## 2015-07-15 DIAGNOSIS — M461 Sacroiliitis, not elsewhere classified: Secondary | ICD-10-CM

## 2015-07-15 DIAGNOSIS — M25559 Pain in unspecified hip: Secondary | ICD-10-CM

## 2015-07-15 DIAGNOSIS — M160 Bilateral primary osteoarthritis of hip: Secondary | ICD-10-CM

## 2015-07-15 DIAGNOSIS — G8929 Other chronic pain: Secondary | ICD-10-CM | POA: Insufficient documentation

## 2015-07-15 DIAGNOSIS — M47817 Spondylosis without myelopathy or radiculopathy, lumbosacral region: Secondary | ICD-10-CM | POA: Diagnosis not present

## 2015-07-15 DIAGNOSIS — M5136 Other intervertebral disc degeneration, lumbar region: Secondary | ICD-10-CM | POA: Diagnosis not present

## 2015-07-15 DIAGNOSIS — M4186 Other forms of scoliosis, lumbar region: Secondary | ICD-10-CM | POA: Diagnosis not present

## 2015-07-26 ENCOUNTER — Encounter: Payer: Self-pay | Admitting: Pain Medicine

## 2015-07-26 ENCOUNTER — Ambulatory Visit: Payer: Medicare Other | Attending: Pain Medicine | Admitting: Pain Medicine

## 2015-07-26 VITALS — BP 158/46 | HR 58 | Temp 98.1°F | Resp 18 | Ht 62.0 in | Wt 120.0 lb

## 2015-07-26 DIAGNOSIS — M79606 Pain in leg, unspecified: Secondary | ICD-10-CM | POA: Diagnosis not present

## 2015-07-26 DIAGNOSIS — M47818 Spondylosis without myelopathy or radiculopathy, sacral and sacrococcygeal region: Secondary | ICD-10-CM

## 2015-07-26 DIAGNOSIS — G8929 Other chronic pain: Secondary | ICD-10-CM | POA: Insufficient documentation

## 2015-07-26 DIAGNOSIS — M545 Low back pain: Secondary | ICD-10-CM

## 2015-07-26 DIAGNOSIS — Z79891 Long term (current) use of opiate analgesic: Secondary | ICD-10-CM

## 2015-07-26 DIAGNOSIS — M47816 Spondylosis without myelopathy or radiculopathy, lumbar region: Secondary | ICD-10-CM | POA: Insufficient documentation

## 2015-07-26 DIAGNOSIS — M47817 Spondylosis without myelopathy or radiculopathy, lumbosacral region: Secondary | ICD-10-CM

## 2015-07-26 DIAGNOSIS — M533 Sacrococcygeal disorders, not elsewhere classified: Secondary | ICD-10-CM | POA: Insufficient documentation

## 2015-07-26 DIAGNOSIS — M160 Bilateral primary osteoarthritis of hip: Secondary | ICD-10-CM | POA: Diagnosis not present

## 2015-07-26 DIAGNOSIS — M461 Sacroiliitis, not elsewhere classified: Secondary | ICD-10-CM

## 2015-07-26 DIAGNOSIS — M549 Dorsalgia, unspecified: Secondary | ICD-10-CM | POA: Diagnosis present

## 2015-07-26 DIAGNOSIS — Z5181 Encounter for therapeutic drug level monitoring: Secondary | ICD-10-CM

## 2015-07-26 MED ORDER — TRAMADOL HCL 50 MG PO TABS: 50.0000 mg | ORAL_TABLET | Freq: Four times a day (QID) | ORAL | Status: DC | PRN

## 2015-07-26 NOTE — Patient Instructions (Signed)
As needed steroid injection procedures.

## 2015-07-26 NOTE — Progress Notes (Signed)
Did not get the oxycodone script filled from the last visit due to patient says she cant take that medication; "makes me float up in the air and crazy and makes me itch and agitated". Here to discuss the x-ray results that were ordered and obtained recently.

## 2015-07-26 NOTE — Progress Notes (Signed)
Patient's Name: Joanne Michael  Patient type: Established  MRN: [id number]  Service setting: Ambulatory outpatient  DOB: 01/11/1925  Location: [facility]  DOS: 07/26/2015  Primary Care Physician: Dion Body, MD  Note by: Kathlen Brunswick. Dossie Arbour, M.D, DABA, DABAPM, DABPM, Milagros Evener, [place]  Referring Physician: Dion Body, MD  Specialty: Board-Certified Interventional Pain Management  Last Visit to Pain Management: 07/14/2015   Primary Reason(s) for Visit: Encounter for prescription drug management (Level of risk: moderate) CC: Back Pain   HPI  Joanne Michael is a 82 y.o. year old, female patient, who returns today as an established patient. 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 [facility]); Combined fat and carbohydrate induced hyperlipemia; Anemia, iron deficiency; Heart murmur, systolic; Arthritis urica; Gastro-esophageal reflux disease without esophagitis; Essential (primary) hypertension; Chronic kidney disease, stage IV (severe) ([place]); Back ache; Chronic low back pain (Location of Primary Source of Pain) (Bilateral) (R>L); Lumbar facet syndrome (Location of Primary Source of Pain) (Bilateral) (R>L); Lumbar spondylosis; Chronic pain; Long term current use of opiate analgesic; Long term prescription opiate use; Opiate use; Encounter for therapeutic drug level monitoring; Encounter for chronic pain management; Chronic lower extremity pain (referred) (Location of Secondary source of pain) (Bilateral) (R>L); Neurogenic pain; Kidney failure; Acute renal failure (ARF) ([place]); Chronic hip pain (Bilateral); Posterior arthritis of hip (Bilateral); Chronic  sacroiliac joint pain (Bilateral); Osteoarthritis of sacroiliac joint (Bilateral); and Lumbar spinal stenosis on her problem list.. Her primarily concern today is the Back Pain   Pain Assessment: Self-Reported Pain Score: 6  Reported level is compatible with observation Pain Type: Chronic pain Pain Location: Back Pain Orientation: Lower Pain Descriptors / Indicators: Sharp, Aching Pain Frequency: Constant  The patient comes into the clinics today for pharmacological management of her chronic pain. I last saw this patient on 07/13/2015. The patient  reports that she does not use illicit drugs. Her body mass index is 21.94 kg/(m^2). Patient returns today after having been given a prescription for oxycodone to try since it was the only medication that was not in her list of allergies. She comes in today indicating that she did not try the medication and she didn't even get it because she was reminded that she has itching when she takes the medication. The patient cannot really even remember when was the last time that she took it, but is seems clear to me that the family is not going to see asked about hair having to take that medicine. Because of this, the only medication that she seems to tolerate assisted tramadol and therefore will go back to it. They biggest problem that we have with her is that she has renal failure. The family was wondering whether or not any of the injections are the medications that we give her through injections can affect the kidney problems. I informed them that to the best of my knowledge and should not.  Date of Last Visit: 07/13/15 Service Provided on Last Visit: Med Refill  Controlled Substance Pharmacotherapy Assessment & REMS (Risk Evaluation and Mitigation Strategy)  Analgesic: Tramadol 50 mg every 6 hours when necessary for pain (200 mg/day) Pill Count: The patient did not bring her medications to the appointment. MME/day: 20 mg/day Pharmacokinetics: Onset of  action (Liberation/Absorption): Within expected pharmacological parameters Time to Peak  effect (Distribution): Timing and results are as within normal expected parameters Duration of action (Metabolism/Excretion): Within normal limits for medication Pharmacodynamics: Analgesic Effect: More than 50% Activity Facilitation: Medication(s) allow patient to sit, stand, walk, and do the basic ADLs Perceived Effectiveness: Described as relatively effective, allowing for increase in activities of daily living (ADL) Side-effects or Adverse reactions: None reported Monitoring: [place] PMP: Online review of the past 50-month period conducted. Compliant with practice rules and regulations Last UDS on record: TOXASSURE SELECT 13  Date Value Ref Range Status  04/18/2015 FINAL  Final    Comment:    ==================================================================== TOXASSURE SELECT 13 (MW) ==================================================================== Test                             Result       Flag       Units Drug Present not Declared for Prescription Verification   Tramadol                       PRESENT      UNEXPECTED   O-Desmethyltramadol            PRESENT      UNEXPECTED    Source of tramadol is a prescription medication.    O-desmethyltramadol is an expected metabolite of tramadol. ==================================================================== Test                      Result    Flag   Units      Ref Range   Creatinine              86               mg/dL      >=20 ==================================================================== Declared Medications:  The flagging and interpretation on this report are based on the  following declared medications.  Unexpected results may arise from  inaccuracies in the declared medications.  **Note: The testing scope of this panel does not include following  reported medications:   Gabapentin ==================================================================== For clinical consultation, please call [phone]. ====================================================================    UDS interpretation: Compliant. Patient takes tramadol. Medication Assessment Form: Reviewed. Patient indicates being compliant with therapy Treatment compliance: Compliant Risk Assessment: Aberrant Behavior: None observed today Substance Use Disorder (SUD) Risk Level: No change since last visit Risk of opioid abuse or dependence: 0.7-3.0% with doses ? 36 MME/day and 6.1-26% with doses ? 120 MME/day. Opioid Risk Tool (ORT) Score: Total Score: 0 Low Risk for SUD (Score <3) Depression Scale Score: PHQ-2: PHQ-2 Total Score: 0 No depression (0) PHQ-9: PHQ-9 Total Score: 0 No depression (0-4)  Pharmacologic Plan: No change in therapy, at this time  Previous Illicit Drug Screen Labs(s): Lab Results  Component Value Date   MDMA NONE DETECTED 08/08/2014   COCAINSCRNUR NONE DETECTED 08/08/2014   PCPSCRNUR NONE DETECTED 08/08/2014   THCU NONE DETECTED 08/08/2014    Laboratory Chemistry  Inflammation Markers Lab Results  Component Value Date   ESRSEDRATE 63* 04/18/2015   CRP <0.5 04/18/2015    Renal Function Lab Results  Component Value Date   BUN 36* 05/25/2015   BUN 36* 05/25/2015   CREATININE 2.12* 05/25/2015   CREATININE 2.13* 05/25/2015   GFRAA 23* 05/25/2015   GFRAA 23* 05/25/2015   GFRNONAA 20* 05/25/2015   GFRNONAA 20* 05/25/2015    Hepatic Function Lab Results  Component Value Date   AST 18 05/22/2015   ALT 7*  05/22/2015   ALBUMIN 2.8* 05/25/2015    Electrolytes Lab Results  Component Value Date   NA 140 05/25/2015   NA 141 05/25/2015   K 4.0 05/25/2015   K 4.0 05/25/2015   CL 112* 05/25/2015   CL 112* 05/25/2015   CALCIUM 8.3* 05/25/2015   CALCIUM 8.4* 05/25/2015   MG 2.1 04/18/2015    Pain Modulating Vitamins No results found for: VD25OH,  VD125OH2TOT, H157544, [id number], VITAMINB12  Coagulation Parameters Lab Results  Component Value Date   INR 0.9 09/23/2013   LABPROT 11.9 09/23/2013   APTT 29.0 09/23/2013   PLT 273 05/23/2015    Note: Labs reviewed. and Results made available to patient  Recent Diagnostic Imaging  Dg Lumbar Spine Complete W/bend  07/15/2015  CLINICAL DATA:  Chronic low back pain with bilateral hip pain EXAM: LUMBAR SPINE - COMPLETE WITH BENDING VIEWS COMPARISON:  None. FINDINGS: Moderate scoliosis in the upper lumbar spine. Negative for fracture. Disc degeneration and spurring on the left T12-L1, L1-2. Grade 1 anterior slip L4-5 with moderate facet degeneration. Disc degeneration and mild spurring L2-3 and L3-4. L5-S1 intact. Flexion and extension views reveal no abnormal movement. Negative for fracture or pars defect. Atherosclerotic calcification abdominal aorta without aneurysm. Surgical clips in the gallbladder fossa. IMPRESSION: Scoliosis and multilevel degenerative change Grade 1 anterior slip L4-5 without abnormal movement on flexion or extension Negative for fracture. Electronically Signed   By: Franchot Gallo M.D.   On: 07/15/2015 12:54   Dg Si Joints  07/15/2015  CLINICAL DATA:  Chronic sacroiliac joint pain EXAM: BILATERAL SACROILIAC JOINTS - 3+ VIEW COMPARISON:  CT pelvis 05/22/2015 FINDINGS: The sacroiliac joint spaces are maintained and there is no evidence of arthropathy. No other bone abnormalities are seen. IMPRESSION: Negative. Electronically Signed   By: Franchot Gallo M.D.   On: 07/15/2015 12:55   Dg Hip Unilat W Or W/o Pelvis 2-3 Views Left  07/15/2015  CLINICAL DATA:  Chronic hip pain EXAM: DG HIP (WITH OR WITHOUT PELVIS) 2-3V LEFT COMPARISON:  None. FINDINGS: There is no evidence of hip fracture or dislocation. There is no evidence of arthropathy or other focal bone abnormality. IMPRESSION: Negative. Electronically Signed   By: Franchot Gallo M.D.   On: 07/15/2015 12:56   Dg Hip  Unilat W Or W/o Pelvis 2-3 Views Right  07/15/2015  CLINICAL DATA:  Chronic hip pain EXAM: DG HIP (WITH OR WITHOUT PELVIS) 2-3V RIGHT COMPARISON:  None. FINDINGS: There is no evidence of hip fracture or dislocation. There is no evidence of arthropathy or other focal bone abnormality. IMPRESSION: Negative. Electronically Signed   By: Franchot Gallo M.D.   On: 07/15/2015 12:57    Meds  The patient has a current medication list which includes the following prescription(s): albuterol, allopurinol, ascorbic acid, aspirin ec, atorvastatin, calcium carbonate-vitamin d, cetirizine, clonidine, ferrous sulfate, fluticasone, furosemide, gabapentin, multivitamin, pantoprazole, probiotic product, and tramadol.  Current Outpatient Prescriptions on File Prior to Visit  Medication Sig  . albuterol (PROVENTIL HFA;VENTOLIN HFA) 108 (90 BASE) MCG/ACT inhaler Inhale 1-2 puffs into the lungs every 6 (six) hours as needed for wheezing or shortness of breath.  . allopurinol (ZYLOPRIM) 100 MG tablet Take 100 mg by mouth 2 (two) times daily.  Marland Kitchen ascorbic acid (VITAMIN C) 1000 MG tablet Take 1,000 mg by mouth daily.  Marland Kitchen aspirin EC 81 MG tablet Take 81 mg by mouth daily.  Marland Kitchen atorvastatin (LIPITOR) 20 MG tablet Take 20 mg by mouth at bedtime.   . Calcium  Carbonate-Vitamin D (CALTRATE 600+D) 600-400 MG-UNIT per tablet Take 1 tablet by mouth daily.    . cetirizine (ZYRTEC) 10 MG chewable tablet Chew 10 mg by mouth daily.    . ferrous sulfate 325 (65 FE) MG tablet Take 325 mg by mouth daily with breakfast.  . fluticasone (FLONASE) 50 MCG/ACT nasal spray Place 2 sprays into both nostrils at bedtime as needed for allergies or rhinitis.  Marland Kitchen gabapentin (NEURONTIN) 300 MG capsule Take 1 capsule (300 mg total) by mouth 2 (two) times daily.  . Multiple Vitamin (MULTIVITAMIN) tablet Take 1 tablet by mouth daily.  . pantoprazole (PROTONIX) 40 MG tablet Take 1 tablet (40 mg total) by mouth daily.  . Probiotic Product (PHILLIPS COLON HEALTH  PO) Take 1 capsule by mouth daily.   No current facility-administered medications on file prior to visit.    ROS  Constitutional: Denies any fever or chills Gastrointestinal: No reported hemesis, hematochezia, vomiting, or acute GI distress Musculoskeletal: Denies any acute onset joint swelling, redness, loss of ROM, or weakness Neurological: No reported episodes of acute onset apraxia, aphasia, dysarthria, agnosia, amnesia, paralysis, loss of coordination, or loss of consciousness  Allergies  Joanne Michael is allergic to oxycodone; codeine; hydrocodone-acetaminophen; iodine; meperidine hcl; other; and propoxyphene n-acetaminophen.  [place]  Medical:  Joanne Michael  has a past medical history of HLD (hyperlipidemia); HTN (hypertension); Cat allergies; Hay fever; GERD (gastroesophageal reflux disease); Arthritis; Fatigue; Iron deficiency anemia; Chronic renal failure; Spinal stenosis; Gout; H/O Clostridium difficile infection; Hiatal hernia; and Cancer ([place]). Family: family history includes Heart failure in her father; Liver cancer in her father; Lung cancer in her brother and sister; Ovarian cancer in her mother; Prostate cancer in her brother. Surgical:  has past surgical history that includes Cataract extraction; Tonsillectomy; Appendectomy; Total abdominal hysterectomy; knee replacement- both; Cholecystectomy; Nose surgery; Shoulder surgery; feet surgery; Back surgery; Hernia repair; Rotator cuff repair; and Melanoma excision. Tobacco:  reports that she has never smoked. She has never used smokeless tobacco. Alcohol:  reports that she does not drink alcohol. Drug:  reports that she does not use illicit drugs.  Constitutional Exam  Vitals: Blood pressure 158/46, pulse 58, temperature 98.1 F (36.7 C), temperature source Oral, resp. rate 18, height 5\' 2"  (1.575 m), weight 120 lb (54.432 kg), SpO2 97 %. General appearance: Well nourished, well developed, and well hydrated. In no acute  distress Calculated BMI/Body habitus: Body mass index is 21.94 kg/(m^2). (18.5-24.9 kg/m2) Ideal body weight Psych/Mental status: Alert and oriented x 3 (person, place, & time) Eyes: PERLA Respiratory: No evidence of acute respiratory distress  Cervical Spine Exam  Inspection: No masses, redness, or swelling Alignment: Symmetrical ROM: Functional: ROM is within functional limits [facility]) Stability: No instability detected Muscle strength & Tone: Functionally intact Sensory: Unimpaired Palpation: No complaints of tenderness  Upper Extremity (UE) Exam    Side: Right upper extremity  Side: Left upper extremity  Inspection: No masses, redness, swelling, or asymmetry  Inspection: No masses, redness, swelling, or asymmetry  ROM:  ROM:  Functional: ROM is within functional limits [facility])  Functional: ROM is within functional limits [facility])  Muscle strength & Tone: Functionally intact  Muscle strength & Tone: Functionally intact  Sensory: Unimpaired  Sensory: Unimpaired  Palpation: Non-contributory  Palpation: Non-contributory   Thoracic Spine Exam  Inspection: No masses, redness, or swelling Alignment: Symmetrical ROM: Functional: ROM is within functional limits [facility]) Stability: No instability detected Sensory: Unimpaired Muscle strength & Tone: Functionally intact Palpation: No complaints of tenderness  Lumbar Spine Exam  Inspection: No masses, redness, or swelling Alignment: Symmetrical ROM: Functional: Decreased ROM Stability: No instability detected Muscle strength & Tone: Functionally intact Sensory: Unimpaired Palpation: Tender Provocative Tests: Lumbar Hyperextension and rotation test: Positive for bilateral lumbar facet pain Patrick's Maneuver: deferred  Gait & Posture Assessment  Ambulation: Unassisted Gait: Antalgic Posture: Antalgic  Lower Extremity Exam    Side: Right lower extremity  Side: Left lower extremity  Inspection: No masses, redness, swelling, or  asymmetry ROM:  Inspection: No masses, redness, swelling, or asymmetry ROM:  Functional: ROM is within functional limits [facility])  Functional: ROM is within functional limits [facility])  Muscle strength & Tone: Functionally intact  Muscle strength & Tone: Functionally intact  Sensory: Unimpaired  Sensory: Unimpaired  Palpation: Non-contributory  Palpation: Non-contributory   Assessment & Plan  Primary Diagnosis & Pertinent Problem List: The primary encounter diagnosis was Chronic low back pain (Location of Primary Source of Pain) (Bilateral) (R>L). Diagnoses of Lumbar facet syndrome (Location of Primary Source of Pain) (Bilateral) (R>L), Lumbar spondylosis, unspecified spinal osteoarthritis, Long term current use of opiate analgesic, Chronic pain of lower extremity, unspecified laterality, Primary osteoarthritis of both hips, Osteoarthritis of sacroiliac joint (Bilateral), and Encounter for therapeutic drug level monitoring were also pertinent to this visit.  Visit Diagnosis: 1. Chronic low back pain (Location of Primary Source of Pain) (Bilateral) (R>L)   2. Lumbar facet syndrome (Location of Primary Source of Pain) (Bilateral) (R>L)   3. Lumbar spondylosis, unspecified spinal osteoarthritis   4. Long term current use of opiate analgesic   5. Chronic pain of lower extremity, unspecified laterality   6. Primary osteoarthritis of both hips   7. Osteoarthritis of sacroiliac joint (Bilateral)   8. Encounter for therapeutic drug level monitoring     Problems updated and reviewed during this visit: No problems updated.  Problem-specific Plan(s): No problem-specific assessment & plan notes found for this encounter.  No new assessment & plan notes have been filed under this hospital service since the last note was generated. Service: Pain Management   Plan of Care   Problem List Items Addressed This Visit      High   Chronic low back pain (Location of Primary Source of Pain) (Bilateral) (R>L)  - Primary (Chronic)   Relevant Medications   traMADol (ULTRAM) 50 MG tablet   Chronic lower extremity pain (referred) (Location of Secondary source of pain) (Bilateral) (R>L) (Chronic)   Relevant Orders   LUMBAR EPIDURAL STEROID INJECTION   Lumbar facet syndrome (Location of Primary Source of Pain) (Bilateral) (R>L) (Chronic)   Relevant Medications   traMADol (ULTRAM) 50 MG tablet   Other Relevant Orders   LUMBAR FACET(MEDIAL BRANCH NERVE BLOCK) MBNB   Lumbar spondylosis (Chronic)   Relevant Medications   traMADol (ULTRAM) 50 MG tablet   Osteoarthritis of sacroiliac joint (Bilateral) (Chronic)   Relevant Medications   traMADol (ULTRAM) 50 MG tablet   Other Relevant Orders   SACROILIAC JOINT INJECTINS   Posterior arthritis of hip (Bilateral) (Chronic)   Relevant Medications   traMADol (ULTRAM) 50 MG tablet   Other Relevant Orders   HIP INJECTION     Medium   Encounter for therapeutic drug level monitoring   Long term current use of opiate analgesic (Chronic)       Pharmacotherapy (Medications Ordered): Meds ordered this encounter  Medications  . traMADol (ULTRAM) 50 MG tablet    Sig: Take 1 tablet (50 mg total) by mouth every 6 (six) hours as needed  for severe pain.    Dispense:  120 tablet    Refill:  2    Do not add this medication to the electronic "Automatic Refill" notification system. Patient may have prescription filled one day early if pharmacy is closed on scheduled refill date. Do not fill until: 07/26/15 To last until: 08/25/15    [facility] & Procedure Ordered: Orders Placed This Encounter  Procedures  . LUMBAR EPIDURAL STEROID INJECTION  . LUMBAR FACET(MEDIAL BRANCH NERVE BLOCK) MBNB  . SACROILIAC JOINT INJECTINS  . HIP INJECTION    Imaging Ordered: None  Interventional Therapies: Scheduled:  None at this time.    Considering:   1. Diagnostic bilateral lumbar facet block under fluoroscopic guidance and IV sedation for the low back pain.   2. Diagnostic bilateral sacroiliac joint injection under fluoroscopic guidance, with or without sedation for the low back pain.  3. Diagnostic right-sided L4-5 lumbar epidural steroid injection under fluoroscopic guidance, with or without sedation for the right lower extremity pain.  4. Diagnostic, bilateral, L4-5 transforaminal epidural steroid injection under fluoroscopic guidance, with or without sedation for the leg pain.  5. Palliative, bilateral, intra-articular knee joint injection under fluoroscopic guidance, with or without sedation for the hip pain.    PRN Procedures:   1. Diagnostic bilateral lumbar facet block under fluoroscopic guidance and IV sedation for the low back pain.  2. Diagnostic bilateral sacroiliac joint injection under fluoroscopic guidance, with or without sedation for the low back pain.  3. Diagnostic right-sided L4-5 lumbar epidural steroid injection under fluoroscopic guidance, with or without sedation for the right lower extremity pain.  4. Diagnostic, bilateral, L4-5 transforaminal epidural steroid injection under fluoroscopic guidance, with or without sedation for the leg pain.  5. Palliative, bilateral, intra-articular knee joint injection under fluoroscopic guidance, with or without sedation for the hip pain.    Referral(s) or Consult(s): None at this time.  New Prescriptions   No medications on file    Medications administered during this visit: Joanne Michael had no medications administered during this visit.  Requested PM Follow-up: Return in about 3 months (around 10/12/2015) for Medication Management, (3-Mo), Procedure (PRN - Patient will call).  Future Appointments Date Time Provider [place]  08/15/2015 11:40 AM Minna Merritts, MD [facility]  10/10/2015 11:20 AM Milinda Pointer, MD [facility] None  10/13/2015 10:00 AM [facility]  10/17/2015 10:00 AM Lloyd Huger, MD [facility] None    Primary Care  Physician: Dion Body, MD Location: [facility] Note by: Kathlen Brunswick Dossie Arbour, M.D, DABA, DABAPM, DABPM, DABIPP, FIPP  Pain Score Disclaimer: We use the NRS-11 scale. This is a self-reported, subjective measurement of pain severity with only modest accuracy. It is used primarily to identify changes within a particular patient. It must be understood that outpatient pain scales are significantly less accurate that those used for research, where they can be applied under ideal controlled circumstances with minimal exposure to variables. In reality, the score is likely to be a combination of pain intensity and pain affect, where pain affect describes the degree of emotional arousal or changes in action readiness caused by the sensory experience of pain. Factors such as social and work situation, setting, emotional state, anxiety levels, expectation, and prior pain experience may influence pain perception and show large inter-individual differences that may also be affected by time variables.  Patient instructions provided during this appointment: Patient Instructions  As needed steroid injection procedures.

## 2015-08-15 ENCOUNTER — Ambulatory Visit (INDEPENDENT_AMBULATORY_CARE_PROVIDER_SITE_OTHER): Payer: Medicare Other | Admitting: Cardiovascular Disease

## 2015-08-15 ENCOUNTER — Encounter: Payer: Self-pay | Admitting: Cardiovascular Disease

## 2015-08-15 VITALS — BP 184/66 | HR 51 | Ht 62.0 in | Wt 119.1 lb

## 2015-08-15 DIAGNOSIS — E785 Hyperlipidemia, unspecified: Secondary | ICD-10-CM

## 2015-08-15 DIAGNOSIS — G8929 Other chronic pain: Secondary | ICD-10-CM | POA: Diagnosis not present

## 2015-08-15 DIAGNOSIS — I350 Nonrheumatic aortic (valve) stenosis: Secondary | ICD-10-CM | POA: Diagnosis not present

## 2015-08-15 DIAGNOSIS — I10 Essential (primary) hypertension: Secondary | ICD-10-CM | POA: Diagnosis not present

## 2015-08-15 MED ORDER — DOXAZOSIN MESYLATE 8 MG PO TABS: 8.0000 mg | ORAL_TABLET | Freq: Every day | ORAL | Status: DC

## 2015-08-15 NOTE — Progress Notes (Signed)
Patient ID: Joanne Michael, female   DOB: 1924/12/24, 82 y.o.   MRN: [id number] Cardiology Office Note  Date:  08/15/2015   ID:  Joanne Michael, DOB 02/25/1925, MRN [id number]  PCP:  Dion Body, MD   Chief Complaint  Patient presents with  . Follow-up    PT HAVING SOME SORENESS IN CHEST AND SOME SOB AND NO SWELLING. NO OTHER COMPLAINTS.    HPI:  [name] is a pleasant 82 yo woman with mild aortic valve stenosis in 2011, GERD, hyperlipidemia, hypertension who presented for lower extremity edema that started in December 2014, with improved symptoms after amlodipine was held She presents today for follow-up of her blood pressure, Previous episode of episode of near syncope  In follow-up today, she reports that she has had some abdominal swelling, shortness of breath for several weeks. She drinks significant amounts of water per day, family continues to push her to drink Denies having any leg edema, but does have some soreness to touch Previous leg edema resolved by holding amlodipine Denies having any further episodes of near syncope or syncope She's not taking Lasix  Reports that she recently got over a sinus infection, Now with a mild cough, shortness of breath  EKG on today's visit shows normal sinus rhythm with rate 76 bpm, sinus arrhythmia/APCs  Other past medical history 08/08/2014 she was at church when she developed syncope/near syncope. She was in a sitting position. Reports indicate low blood pressure. She was taken to the emergency room, lab work showed elevated BUN and creatinine consistent with dehydration. She was given fluids with improvement of her symptoms. She denied any recent illnesses. Several days prior she had radiofrequency ablation of her back.   Lab work from primary care shows creatinine 2.0, GFR in the 20s she was previously on lisinopril and HCTZ, pill. This was held and she was changed to amlodipine Laboratory from 07/08/2011 shows  creatinine 2.1, GFR 22, BUN 21, potassium 3.3  Prior echocardiogram in 2011 showing normal LV systolic function, diastolic relaxation abnormality, mild aortic valve stenosis with no significant gradient, mild MR, high normal right ventricular systolic pressures  PMH:   has a past medical history of HLD (hyperlipidemia); HTN (hypertension); Cat allergies; Hay fever; GERD (gastroesophageal reflux disease); Arthritis; Fatigue; Iron deficiency anemia; Chronic renal failure; Spinal stenosis; Gout; H/O Clostridium difficile infection; Hiatal hernia; and Cancer ([place]).  PSH:    Past Surgical History  Procedure Laterality Date  . Cataract extraction    . Tonsillectomy    . Appendectomy    . Total abdominal hysterectomy    . Knee replacement- both    . Cholecystectomy    . Nose surgery    . Shoulder surgery    . Feet surgery    . Back surgery    . Hernia repair    . Rotator cuff repair      left  . Melanoma excision      Current Outpatient Prescriptions  Medication Sig Dispense Refill  . albuterol (PROVENTIL HFA;VENTOLIN HFA) 108 (90 BASE) MCG/ACT inhaler Inhale 1-2 puffs into the lungs every 6 (six) hours as needed for wheezing or shortness of breath. 1 Inhaler 6  . allopurinol (ZYLOPRIM) 100 MG tablet Take 100 mg by mouth 2 (two) times daily.    Marland Kitchen ascorbic acid (VITAMIN C) 1000 MG tablet Take 1,000 mg by mouth daily.    Marland Kitchen aspirin EC 81 MG tablet Take 81 mg by mouth daily.    Marland Kitchen atorvastatin (LIPITOR) 20  MG tablet Take 20 mg by mouth at bedtime.     . Calcium Carbonate-Vitamin D (CALTRATE 600+D) 600-400 MG-UNIT per tablet Take 1 tablet by mouth daily.      . cetirizine (ZYRTEC) 10 MG chewable tablet Chew 10 mg by mouth daily.      . cloNIDine (CATAPRES) 0.1 MG tablet Take 0.1 mg by mouth 2 (two) times daily.     . ferrous sulfate 325 (65 FE) MG tablet Take 325 mg by mouth daily with breakfast.    . fluticasone (FLONASE) 50 MCG/ACT nasal spray Place 2 sprays into both nostrils at bedtime as  needed for allergies or rhinitis. 16 g 0  . furosemide (LASIX) 20 MG tablet Take 20 mg by mouth daily as needed.    . gabapentin (NEURONTIN) 300 MG capsule Take 1 capsule (300 mg total) by mouth 2 (two) times daily. 60 capsule 2  . Multiple Vitamin (MULTIVITAMIN) tablet Take 1 tablet by mouth daily.    . pantoprazole (PROTONIX) 40 MG tablet Take 1 tablet (40 mg total) by mouth daily. 30 tablet 11  . Probiotic Product (PHILLIPS COLON HEALTH PO) Take 1 capsule by mouth daily.    . traMADol (ULTRAM) 50 MG tablet Take 1 tablet (50 mg total) by mouth every 6 (six) hours as needed for severe pain. 120 tablet 2   No current facility-administered medications for this visit.     Allergies:   Oxycodone; Codeine; Hydrocodone-acetaminophen; Iodine; Meperidine hcl; Other; and Propoxyphene n-acetaminophen   Social History:  The patient  reports that she has never smoked. She has never used smokeless tobacco. She reports that she does not drink alcohol or use illicit drugs.   Family History:   family history includes Heart failure in her father; Liver cancer in her father; Lung cancer in her brother and sister; Ovarian cancer in her mother; Prostate cancer in her brother.    Review of Systems: ROS   PHYSICAL EXAM: VS:  BP 184/66 mmHg  Pulse 51  Ht 5\' 2"  (1.575 m)  Wt 119 lb 1.9 oz (54.032 kg)  BMI 21.78 kg/m2 , BMI Body mass index is 21.78 kg/(m^2). GEN: Well nourished, well developed, in no acute distress HEENT: normal Neck: no JVD, carotid bruits, or masses Cardiac: RRR; no murmurs, rubs, or gallops,no edema  Respiratory:  clear to auscultation bilaterally, normal work of breathing GI: soft, nontender, nondistended, + BS MS: no deformity or atrophy Skin: warm and dry, no rash Neuro:  Strength and sensation are intact Psych: euthymic mood, full affect    Recent Labs: 04/18/2015: Magnesium 2.1 05/22/2015: ALT 7* 05/23/2015: Hemoglobin 8.8*; Platelets 273 05/25/2015: BUN 36*; BUN 36*;  Creatinine, Ser 2.13*; Creatinine, Ser 2.12*; Potassium 4.0; Potassium 4.0; Sodium 141; Sodium 140    Lipid Panel Lab Results  Component Value Date   CHOL 167 09/23/2013   HDL 56 09/23/2013   LDLCALC 92 09/23/2013   TRIG 94 09/23/2013      Wt Readings from Last 3 Encounters:  08/15/15 119 lb 1.9 oz (54.032 kg)  07/26/15 120 lb (54.432 kg)  07/13/15 125 lb (56.7 kg)       ASSESSMENT AND PLAN:  Essential hypertension - Plan: EKG 12-Lead   Disposition:   F/U  6 months  Orders Placed This Encounter  Procedures  . EKG 12-Lead     Signed, Esmond Plants, M.D., Ph.D. 08/15/2015  [place], [place]

## 2015-08-15 NOTE — Patient Instructions (Addendum)
You are doing well.  Please start cardura/doxazosin 1/2 pill twice a day Check pressures, If blood pressure is elevated, Call the office  Other blood pressure pills: Hydralazine, isosorbide  Will avoid amloidpine/ Ca channel blocker, ARB, ACE, diuretics  Please call us if you have new issues that need to be addressed before your next appt.  Your physician wants you to follow-up in: 3 months.  You will receive a reminder letter in the mail two months in advance. If you don't receive a letter, please call our office to schedule the follow-up appointment.

## 2015-08-15 NOTE — Progress Notes (Signed)
Quick Note:  Results were reviewed and found to be: abnormal   Based on these results: the patient may be a candidate for interventional pain management options  In addition: there is no acute injury or pathology identified ______

## 2015-08-15 NOTE — Progress Notes (Signed)
Patient ID: Joanne Michael, female   DOB: [birth year], 82 y.o.   MRN: [id number] Cardiology Office Note  Date:  08/15/2015   ID:  Joanne Michael, DOB [birth year], MRN [id number]  PCP:  Dion Body, MD   Chief Complaint  Patient presents with  . Follow-up    PT HAVING SOME SORENESS IN CHEST AND SOME SOB AND NO SWELLING. NO OTHER COMPLAINTS.    HPI:  Joanne Michael is a pleasant 82 yo woman with mild aortic valve stenosis in 2011, GERD, hyperlipidemia, hypertension who presented for lower extremity edema that started in December 2014, with improved symptoms after amlodipine was held She presents today for follow-up of her blood pressure, Previous episode of episode of near syncope  In follow-up today, blood pressure is elevated Family presents with her today, reports that it is high at home, typically systolic pressure more than 160 Significant diffuse arthritis, right neck, back. She sees the pain clinic Takes tramadol and gabapentin Denies having any further episodes of near syncope or syncope  She has follow-up with Dr. Candiss Norse nephrology  next week Also follow-up with primary care the end of the month  EKG on today's visit shows normal sinus rhythm with rate 51 bpm, sinus arrhythmia/APCs  Other past medical history 08/08/2014 she was at church when she developed syncope/near syncope. She was in a sitting position. Reports indicate low blood pressure. She was taken to the emergency room, lab work showed elevated BUN and creatinine consistent with dehydration. She was given fluids with improvement of her symptoms. She denied any recent illnesses. Several days prior she had radiofrequency ablation of her back.   Lab work from primary care shows creatinine 2.0, GFR in the 20s she was previously on lisinopril and HCTZ, pill. This was held and she was changed to amlodipine Laboratory from 07/08/2011 shows creatinine 2.1, GFR 22, BUN 21, potassium 3.3  Prior echocardiogram in 2011  showing normal LV systolic function, diastolic relaxation abnormality, mild aortic valve stenosis with no significant gradient, mild MR, high normal right ventricular systolic pressures  PMH:   has a past medical history of HLD (hyperlipidemia); HTN (hypertension); Cat allergies; Hay fever; GERD (gastroesophageal reflux disease); Arthritis; Fatigue; Iron deficiency anemia; Chronic renal failure; Spinal stenosis; Gout; H/O Clostridium difficile infection; Hiatal hernia; and Cancer ([place]).  PSH:    Past Surgical History  Procedure Laterality Date  . Cataract extraction    . Tonsillectomy    . Appendectomy    . Total abdominal hysterectomy    . Knee replacement- both    . Cholecystectomy    . Nose surgery    . Shoulder surgery    . Feet surgery    . Back surgery    . Hernia repair    . Rotator cuff repair      left  . Melanoma excision      Current Outpatient Prescriptions  Medication Sig Dispense Refill  . albuterol (PROVENTIL HFA;VENTOLIN HFA) 108 (90 BASE) MCG/ACT inhaler Inhale 1-2 puffs into the lungs every 6 (six) hours as needed for wheezing or shortness of breath. 1 Inhaler 6  . allopurinol (ZYLOPRIM) 100 MG tablet Take 100 mg by mouth 2 (two) times daily.    Marland Kitchen ascorbic acid (VITAMIN C) 1000 MG tablet Take 1,000 mg by mouth daily.    Marland Kitchen aspirin EC 81 MG tablet Take 81 mg by mouth daily.    Marland Kitchen atorvastatin (LIPITOR) 20 MG tablet Take 20 mg by mouth at bedtime.     Marland Kitchen  Calcium Carbonate-Vitamin D (CALTRATE 600+D) 600-400 MG-UNIT per tablet Take 1 tablet by mouth daily.      . cetirizine (ZYRTEC) 10 MG chewable tablet Chew 10 mg by mouth daily.      . cloNIDine (CATAPRES) 0.1 MG tablet Take 0.1 mg by mouth 2 (two) times daily.     . ferrous sulfate 325 (65 FE) MG tablet Take 325 mg by mouth daily with breakfast.    . fluticasone (FLONASE) 50 MCG/ACT nasal spray Place 2 sprays into both nostrils at bedtime as needed for allergies or rhinitis. 16 g 0  . furosemide (LASIX) 20 MG tablet  Take 20 mg by mouth daily as needed.    . gabapentin (NEURONTIN) 300 MG capsule Take 1 capsule (300 mg total) by mouth 2 (two) times daily. 60 capsule 2  . Multiple Vitamin (MULTIVITAMIN) tablet Take 1 tablet by mouth daily.    . pantoprazole (PROTONIX) 40 MG tablet Take 1 tablet (40 mg total) by mouth daily. 30 tablet 11  . Probiotic Product (PHILLIPS COLON HEALTH PO) Take 1 capsule by mouth daily.    . traMADol (ULTRAM) 50 MG tablet Take 1 tablet (50 mg total) by mouth every 6 (six) hours as needed for severe pain. 120 tablet 2  . doxazosin (CARDURA) 8 MG tablet Take 1 tablet (8 mg total) by mouth daily. 30 tablet 6   No current facility-administered medications for this visit.     Allergies:   Oxycodone; Codeine; Hydrocodone-acetaminophen; Iodine; Meperidine hcl; Other; and Propoxyphene n-acetaminophen   Social History:  The patient  reports that she has never smoked. She has never used smokeless tobacco. She reports that she does not drink alcohol or use illicit drugs.   Family History:   family history includes Heart failure in her father; Liver cancer in her father; Lung cancer in her brother and sister; Ovarian cancer in her mother; Prostate cancer in her brother.    Review of Systems: Review of Systems  Constitutional: Negative.   Respiratory: Negative.   Cardiovascular: Positive for leg swelling.  Gastrointestinal: Negative.   Musculoskeletal: Negative.   Neurological: Negative.   Psychiatric/Behavioral: Negative.   All other systems reviewed and are negative.    PHYSICAL EXAM: VS:  BP 184/66 mmHg  Pulse 51  Ht 5\' 2"  (1.575 m)  Wt 119 lb 1.9 oz (54.032 kg)  BMI 21.78 kg/m2 , BMI Body mass index is 21.78 kg/(m^2).  Blood pressure remains elevated on recheck by myself GEN: Well nourished, well developed, in no acute distress HEENT: normal Neck: no JVD, carotid bruits, or masses Cardiac: RRR; no murmurs, rubs, or gallops, trace edema  Respiratory:  clear to  auscultation bilaterally, normal work of breathing GI: soft, nontender, nondistended, + BS Joanne: no deformity or atrophy Skin: warm and dry, no rash Neuro:  Strength and sensation are intact Psych: euthymic mood, full affect    Recent Labs: 04/18/2015: Magnesium 2.1 05/22/2015: ALT 7* 05/23/2015: Hemoglobin 8.8*; Platelets 273 05/25/2015: BUN 36*; BUN 36*; Creatinine, Ser 2.13*; Creatinine, Ser 2.12*; Potassium 4.0; Potassium 4.0; Sodium 141; Sodium 140    Lipid Panel Lab Results  Component Value Date   CHOL 167 09/23/2013   HDL 56 09/23/2013   LDLCALC 92 09/23/2013   TRIG 94 09/23/2013      Wt Readings from Last 3 Encounters:  08/15/15 119 lb 1.9 oz (54.032 kg)  07/26/15 120 lb (54.432 kg)  07/13/15 125 lb (56.7 kg)       ASSESSMENT AND PLAN:  Essential  hypertension - Plan: EKG 12-Lead Blood pressure elevated on today's visit, Family confirming elevated at home Recommended she start Cardura 4 mg twice a day Other options for blood pressure medications include hydralazine and isosorbide Will not increase the clonidine given her bradycardia Family will monitor blood pressures closely and call our office for further medication titration We will avoid ACE inhibitor, ARB, calcium channel blockers (prior leg swelling)  Hyperlipidemia Encouraged her to stay on her Lipitor  Aortic valve stenosis Mild aortic valve stenosis  we'll monitor periodically with echo   Chronic pain Managed in the pain clinic Worsening pain in the right neck  Disposition:   F/U  3 months   Orders Placed This Encounter  Procedures  . EKG 12-Lead    Total encounter time more than 15 minutes  Greater than 50% was spent in counseling and coordination of care with the patient   Signed, Esmond Plants, M.D., Ph.D. 08/15/2015  [place], [place]

## 2015-08-16 ENCOUNTER — Ambulatory Visit
Admission: RE | Admit: 2015-08-16 | Discharge: 2015-08-16 | Disposition: A | Discharge status: Home / Self Care | Payer: Medicare Other | Source: Ambulatory Visit | Attending: Pain Medicine | Admitting: Pain Medicine

## 2015-08-16 DIAGNOSIS — M545 Low back pain: Secondary | ICD-10-CM | POA: Diagnosis present

## 2015-08-16 DIAGNOSIS — M48061 Spinal stenosis, lumbar region without neurogenic claudication: Secondary | ICD-10-CM

## 2015-08-16 DIAGNOSIS — M47816 Spondylosis without myelopathy or radiculopathy, lumbar region: Secondary | ICD-10-CM | POA: Diagnosis not present

## 2015-08-16 DIAGNOSIS — M5136 Other intervertebral disc degeneration, lumbar region: Secondary | ICD-10-CM | POA: Diagnosis not present

## 2015-08-16 DIAGNOSIS — M4806 Spinal stenosis, lumbar region: Secondary | ICD-10-CM | POA: Insufficient documentation

## 2015-08-16 DIAGNOSIS — M4186 Other forms of scoliosis, lumbar region: Secondary | ICD-10-CM | POA: Diagnosis not present

## 2015-10-10 ENCOUNTER — Ambulatory Visit: Payer: Medicare Other | Attending: Pain Medicine | Admitting: Pain Medicine

## 2015-10-10 ENCOUNTER — Encounter: Payer: Self-pay | Admitting: Pain Medicine

## 2015-10-10 VITALS — BP 201/83 | HR 78 | Temp 97.0°F | Resp 18 | Ht 62.0 in | Wt 120.0 lb

## 2015-10-10 DIAGNOSIS — I129 Hypertensive chronic kidney disease with stage 1 through stage 4 chronic kidney disease, or unspecified chronic kidney disease: Secondary | ICD-10-CM | POA: Diagnosis not present

## 2015-10-10 DIAGNOSIS — M150 Primary generalized (osteo)arthritis: Secondary | ICD-10-CM | POA: Diagnosis not present

## 2015-10-10 DIAGNOSIS — D509 Iron deficiency anemia, unspecified: Secondary | ICD-10-CM | POA: Insufficient documentation

## 2015-10-10 DIAGNOSIS — Z85820 Personal history of malignant melanoma of skin: Secondary | ICD-10-CM | POA: Diagnosis not present

## 2015-10-10 DIAGNOSIS — G8929 Other chronic pain: Secondary | ICD-10-CM | POA: Insufficient documentation

## 2015-10-10 DIAGNOSIS — R609 Edema, unspecified: Secondary | ICD-10-CM | POA: Diagnosis not present

## 2015-10-10 DIAGNOSIS — Z7982 Long term (current) use of aspirin: Secondary | ICD-10-CM | POA: Diagnosis not present

## 2015-10-10 DIAGNOSIS — R079 Chest pain, unspecified: Secondary | ICD-10-CM | POA: Insufficient documentation

## 2015-10-10 DIAGNOSIS — Z9889 Other specified postprocedural states: Secondary | ICD-10-CM | POA: Insufficient documentation

## 2015-10-10 DIAGNOSIS — M47816 Spondylosis without myelopathy or radiculopathy, lumbar region: Secondary | ICD-10-CM

## 2015-10-10 DIAGNOSIS — N184 Chronic kidney disease, stage 4 (severe): Secondary | ICD-10-CM | POA: Diagnosis not present

## 2015-10-10 DIAGNOSIS — R911 Solitary pulmonary nodule: Secondary | ICD-10-CM | POA: Diagnosis not present

## 2015-10-10 DIAGNOSIS — M4806 Spinal stenosis, lumbar region: Secondary | ICD-10-CM

## 2015-10-10 DIAGNOSIS — M5126 Other intervertebral disc displacement, lumbar region: Secondary | ICD-10-CM | POA: Insufficient documentation

## 2015-10-10 DIAGNOSIS — M25552 Pain in left hip: Secondary | ICD-10-CM | POA: Diagnosis not present

## 2015-10-10 DIAGNOSIS — M25541 Pain in joints of right hand: Secondary | ICD-10-CM | POA: Diagnosis present

## 2015-10-10 DIAGNOSIS — I350 Nonrheumatic aortic (valve) stenosis: Secondary | ICD-10-CM | POA: Insufficient documentation

## 2015-10-10 DIAGNOSIS — M109 Gout, unspecified: Secondary | ICD-10-CM | POA: Insufficient documentation

## 2015-10-10 DIAGNOSIS — K219 Gastro-esophageal reflux disease without esophagitis: Secondary | ICD-10-CM | POA: Insufficient documentation

## 2015-10-10 DIAGNOSIS — M159 Polyosteoarthritis, unspecified: Secondary | ICD-10-CM

## 2015-10-10 DIAGNOSIS — E7800 Pure hypercholesterolemia, unspecified: Secondary | ICD-10-CM | POA: Insufficient documentation

## 2015-10-10 DIAGNOSIS — M1991 Primary osteoarthritis, unspecified site: Secondary | ICD-10-CM | POA: Diagnosis not present

## 2015-10-10 DIAGNOSIS — M25551 Pain in right hip: Secondary | ICD-10-CM | POA: Insufficient documentation

## 2015-10-10 DIAGNOSIS — M48061 Spinal stenosis, lumbar region without neurogenic claudication: Secondary | ICD-10-CM

## 2015-10-10 DIAGNOSIS — R011 Cardiac murmur, unspecified: Secondary | ICD-10-CM | POA: Diagnosis not present

## 2015-10-10 DIAGNOSIS — N19 Unspecified kidney failure: Secondary | ICD-10-CM | POA: Diagnosis not present

## 2015-10-10 DIAGNOSIS — M533 Sacrococcygeal disorders, not elsewhere classified: Secondary | ICD-10-CM | POA: Diagnosis not present

## 2015-10-10 DIAGNOSIS — E782 Mixed hyperlipidemia: Secondary | ICD-10-CM | POA: Insufficient documentation

## 2015-10-10 DIAGNOSIS — M545 Low back pain: Secondary | ICD-10-CM | POA: Diagnosis present

## 2015-10-10 MED ORDER — NONFORMULARY OR COMPOUNDED ITEM: 0 refills | Status: DC

## 2015-10-10 MED ORDER — TRAMADOL HCL 50 MG PO TABS: 50.0000 mg | ORAL_TABLET | Freq: Four times a day (QID) | ORAL | 2 refills | Status: DC | PRN

## 2015-10-10 NOTE — Progress Notes (Signed)
Safety precautions to be maintained throughout the outpatient stay will include: orient to surroundings, keep bed in low position, maintain call bell within reach at all times, provide assistance with transfer out of bed and ambulation.  Did not bring pill bottles for pill count.  Reminded to bring to all appointments.   Patient did not have a current list of medications.  Was unsure of list that was called out.  Was able to confirm that she takes Tramadol 50 mg, 1 tablet every 6 hours as needed. Patient states she has had some kidney problems, but could not elaborate.

## 2015-10-10 NOTE — Progress Notes (Signed)
Patient's Name: MONIYA MEDITZ  Patient type: Established  MRN: [id number]  Service setting: Ambulatory outpatient  DOB: [birth year]  Location: [facility]  DOS: 10/10/2015  Primary Care Physician: Dion Body, MD  Note by: Kathlen Brunswick. Dossie Arbour, M.D  Referring Physician: Dion Body, MD  Specialty: Interventional Pain Management  Last Visit to Pain Management: 07/26/2015   Primary Reason(s) for Visit: Encounter for prescription drug management (Level of risk: moderate) CC: Back Pain (right lower) and Hand Pain (right)   HPI  Ms. Larosa is a 82 y.o. year old, female patient, who returns today as an established patient. 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 [facility]); Combined fat and carbohydrate induced hyperlipemia; Anemia, iron deficiency; Heart murmur, systolic; Arthritis urica; Gastro-esophageal reflux disease without esophagitis; Essential (primary) hypertension; CKD (chronic kidney disease) stage 4, GFR 15-29 ml/min ([place]); Chronic low back pain (Location of Primary Source of Pain) (Bilateral) (R>L); Lumbar facet syndrome (Location of Primary Source of Pain) (Bilateral) (R>L); Lumbar spondylosis; Chronic pain; Long term current use of opiate analgesic; Long term prescription opiate use; Opiate use; Encounter for therapeutic drug level monitoring; Encounter for chronic pain management; Chronic lower extremity pain (referred) (Location of Secondary source of pain) (Bilateral) (R>L); Neurogenic pain; Kidney failure; Acute renal failure (ARF) ([place]); Chronic hip pain (Bilateral); Posterior arthritis of hip (Bilateral); Chronic sacroiliac joint pain  (Bilateral); Osteoarthritis of sacroiliac joint (Bilateral); Lumbar spinal stenosis; Gout, joint; and Pure hypercholesterolemia on her problem list.. Her primarily concern today is the Back Pain (right lower) and Hand Pain (right)   Pain Assessment: Self-Reported Pain Score: 5  (pain scale information given) Clinically the patient looks like a 3/10 Reported level is inconsistent with clinical obrservations Information on the proper use of the pain score provided to the patient today.    The patient comes into the clinics today for pharmacological management of her chronic pain. I last saw this patient on 07/26/2015. The patient  reports that she does not use drugs. Her body mass index is 21.95 kg/m.  The patient is complaining of pain in multiple locations and she was wondering photic do injections into her hand joints. At this point, she has pain in multiple joints and I do not see how this would provide her with any long-term benefit. Rather than doing this, I have decided to let her try an analgesic cream. I have given her a prescription to take to a compounding pharmacy to try. More than anything else, I'm concerned about her kidney function. She has a history of gout and takes chronic NSAIDs, neither of which is good for her kidneys.  Date of Last Visit: 07/26/15 Service Provided on Last Visit: Med Refill  Controlled Substance Pharmacotherapy Assessment & REMS (Risk Evaluation and Mitigation Strategy)  Analgesic: Tramadol 50 mg every 6 hours when necessary for pain (200 mg/day) MME/day: 20 mg/day Pill Count: Did not bring pill bottles for pill count.  Reminded to bring to all appointments.  Pharmacokinetics: Onset of action (Liberation/Absorption): Within expected pharmacological parameters Time to Peak effect (Distribution): Timing and results are as within normal expected parameters Duration of action (Metabolism/Excretion): Within normal limits for  medication Pharmacodynamics: Analgesic Effect: More than 50% Activity Facilitation: Medication(s) allow patient to sit, stand, walk, and do the basic ADLs Perceived Effectiveness:  Described as relatively effective, allowing for increase in activities of daily living (ADL) Side-effects or Adverse reactions: None reported Monitoring: [place] PMP: Online review of the past 32-month period conducted. Compliant with practice rules and regulations Last UDS on record: ToxAssure Select 13  Date Value Ref Range Status  04/18/2015 FINAL  Final    Comment:    ==================================================================== TOXASSURE SELECT 13 (MW) ==================================================================== Test                             Result       Flag       Units Drug Present not Declared for Prescription Verification   Tramadol                       PRESENT      UNEXPECTED   O-Desmethyltramadol            PRESENT      UNEXPECTED    Source of tramadol is a prescription medication.    O-desmethyltramadol is an expected metabolite of tramadol. ==================================================================== Test                      Result    Flag   Units      Ref Range   Creatinine              86               mg/dL      >=20 ==================================================================== Declared Medications:  The flagging and interpretation on this report are based on the  following declared medications.  Unexpected results may arise from  inaccuracies in the declared medications.  **Note: The testing scope of this panel does not include following  reported medications:  Gabapentin ==================================================================== For clinical consultation, please call [phone]. ====================================================================    UDS interpretation: Compliant          Medication Assessment Form: Reviewed. Patient  indicates being compliant with therapy Treatment compliance: Compliant Risk Assessment: Aberrant Behavior: None observed today Substance Use Disorder (SUD) Risk Level: No change since last visit Risk of opioid abuse or dependence: 0.7-3.0% with doses ? 36 MME/day and 6.1-26% with doses ? 120 MME/day. Opioid Risk Tool (ORT) Score: Total Score: 0 Low Risk for SUD (Score <3) Depression Scale Score: PHQ-2: PHQ-2 Total Score: 0 No depression (0) PHQ-9: PHQ-9 Total Score: 0 No depression (0-4)  Pharmacologic Plan: No change in therapy, at this time  Laboratory Chemistry  Inflammation Markers Lab Results  Component Value Date   ESRSEDRATE 63 (H) 04/18/2015   CRP <0.5 04/18/2015    Renal Function Lab Results  Component Value Date   BUN 36 (H) 05/25/2015   BUN 36 (H) 05/25/2015   CREATININE 2.12 (H) 05/25/2015   CREATININE 2.13 (H) 05/25/2015   GFRAA 23 (L) 05/25/2015   GFRAA 23 (L) 05/25/2015   GFRNONAA 20 (L) 05/25/2015   GFRNONAA 20 (L) 05/25/2015    Hepatic Function Lab Results  Component Value Date   AST 18 05/22/2015   ALT 7 (L) 05/22/2015   ALBUMIN 2.8 (L) 05/25/2015    Electrolytes Lab Results  Component Value Date   NA 140 05/25/2015   NA 141 05/25/2015   K 4.0 05/25/2015   K 4.0 05/25/2015   CL 112 (H) 05/25/2015   CL 112 (H) 05/25/2015   CALCIUM 8.3 (L) 05/25/2015   CALCIUM 8.4 (L) 05/25/2015   MG  2.1 04/18/2015    Pain Modulating Vitamins No results found for: Marveen Reeks, [id number], [id number], 25OHVITD1, 25OHVITD2, 25OHVITD3, VITAMINB12  Coagulation Parameters Lab Results  Component Value Date   INR 0.9 09/23/2013   LABPROT 11.9 09/23/2013   APTT 29.0 09/23/2013   PLT 273 05/23/2015    Cardiovascular Lab Results  Component Value Date   HGB 8.8 (L) 05/23/2015   HCT 26.5 (L) 05/23/2015    Note: Lab results reviewed.  Recent Diagnostic Imaging  Mr Lumbar Spine Wo Contrast  Result Date: 08/16/2015 CLINICAL DATA:  Lumbar spinal  stenosis. Low back pain. Bilateral leg pain. EXAM: MRI LUMBAR SPINE WITHOUT CONTRAST TECHNIQUE: Multiplanar, multisequence MR imaging of the lumbar spine was performed. No intravenous contrast was administered. COMPARISON:  Lumbar MRI 11/22/2008 FINDINGS: Segmentation:  Normal segmentation.  Lowest disc space L5-S1 Alignment: 5 mm anterior slip L4-5 similar to the prior study. Mild retrolisthesis T12-L1 and L1-2 similar to the prior study. Vertebrae: Moderate dextroscoliosis at T12-L1. There is chronic loss of height of L1 especially on the left. No acute fracture. No bone marrow edema. No vertebral body mass lesion. Conus medullaris: Extends to the L1-2 level and appears normal. Paraspinal and other soft tissues: Paraspinous muscles show symmetric atrophy. No retroperitoneal mass or adenopathy. Abdominal aorta normal in caliber. Disc levels: T12-L1: Advanced disc degeneration. Diffuse endplate spurring similar to prior study. Mild to moderate spinal and foraminal stenosis bilaterally unchanged L1-2: Advanced disc degeneration and spurring left greater than right. Moderate spinal stenosis. Moderate left foraminal encroachment. Bilateral facet hypertrophy. L2-3: Mild disc degeneration with disc bulging and spurring. Right laminectomy. No significant spinal stenosis. Mild foraminal encroachment bilaterally unchanged. No significant spinal stenosis L3-4: Disc degeneration and spondylosis unchanged from the prior study. Moderate foraminal encroachment bilaterally. Right laminectomy without significant spinal stenosis. L4-5: 5 mm anterior slip with moderate to advanced facet degeneration. Mild spinal stenosis. Moderate foraminal stenosis bilaterally L5-S1: Mild disc and facet degeneration without significant stenosis. IMPRESSION: Lumbar scoliosis. Multilevel disc degeneration and spondylosis throughout the lower thoracic and entire lumbar spine similar to 2010. Multilevel spinal and foraminal stenosis without significant  change. Right-sided laminectomy L2-3 and L3-4 with satisfactory decompression of the spinal canal. Electronically Signed   By: Franchot Gallo M.D.   On: 08/16/2015 14:40    Meds  The patient has a current medication list which includes the following prescription(s): albuterol, allopurinol, ascorbic acid, aspirin ec, atorvastatin, benzonatate, calcium carbonate-vitamin d, cetirizine, clonidine, doxazosin, ferrous sulfate, fluticasone, furosemide, gabapentin, meloxicam, multivitamin, pantoprazole, probiotic product, tramadol, and NONFORMULARY OR COMPOUNDED ITEM.  Current Outpatient Prescriptions on File Prior to Visit  Medication Sig  . albuterol (PROVENTIL HFA;VENTOLIN HFA) 108 (90 BASE) MCG/ACT inhaler Inhale 1-2 puffs into the lungs every 6 (six) hours as needed for wheezing or shortness of breath.  . allopurinol (ZYLOPRIM) 100 MG tablet Take 100 mg by mouth 2 (two) times daily.  Marland Kitchen ascorbic acid (VITAMIN C) 1000 MG tablet Take 1,000 mg by mouth daily.  Marland Kitchen aspirin EC 81 MG tablet Take 81 mg by mouth daily.  Marland Kitchen atorvastatin (LIPITOR) 20 MG tablet Take 20 mg by mouth at bedtime.   . Calcium Carbonate-Vitamin D (CALTRATE 600+D) 600-400 MG-UNIT per tablet Take 1 tablet by mouth daily.    . cetirizine (ZYRTEC) 10 MG chewable tablet Chew 10 mg by mouth daily.    . cloNIDine (CATAPRES) 0.1 MG tablet Take 0.1 mg by mouth 2 (two) times daily.   Marland Kitchen doxazosin (CARDURA) 8 MG tablet Take 1 tablet (8 mg  total) by mouth daily.  . ferrous sulfate 325 (65 FE) MG tablet Take 325 mg by mouth daily with breakfast.  . fluticasone (FLONASE) 50 MCG/ACT nasal spray Place 2 sprays into both nostrils at bedtime as needed for allergies or rhinitis.  . furosemide (LASIX) 20 MG tablet Take 20 mg by mouth daily as needed.  . gabapentin (NEURONTIN) 300 MG capsule Take 1 capsule (300 mg total) by mouth 2 (two) times daily.  . Multiple Vitamin (MULTIVITAMIN) tablet Take 1 tablet by mouth daily.  . pantoprazole (PROTONIX) 40 MG  tablet Take 1 tablet (40 mg total) by mouth daily.  . Probiotic Product (PHILLIPS COLON HEALTH PO) Take 1 capsule by mouth daily.   No current facility-administered medications on file prior to visit.     ROS  Constitutional: Denies any fever or chills Gastrointestinal: No reported hemesis, hematochezia, vomiting, or acute GI distress Musculoskeletal: Denies any acute onset joint swelling, redness, loss of ROM, or weakness Neurological: No reported episodes of acute onset apraxia, aphasia, dysarthria, agnosia, amnesia, paralysis, loss of coordination, or loss of consciousness  Allergies  Ms. Patalano is allergic to oxycodone; codeine; hydrocodone-acetaminophen; iodine; meperidine hcl; other; and propoxyphene n-acetaminophen.  [place]  Medical:  Ms. Thissell  has a past medical history of Arthritis; Cancer ([place]); Cat allergies; Chronic renal failure; Fatigue; GERD (gastroesophageal reflux disease); Gout; H/O Clostridium difficile infection; Hay fever; Hiatal hernia; HLD (hyperlipidemia); HTN (hypertension); Iron deficiency anemia; and Spinal stenosis. Family: family history includes Heart failure in her father; Liver cancer in her father; Lung cancer in her brother and sister; Ovarian cancer in her mother; Prostate cancer in her brother. Surgical:  has a past surgical history that includes Cataract extraction; Tonsillectomy; Appendectomy; Total abdominal hysterectomy; knee replacement- both; Cholecystectomy; Nose surgery; Shoulder surgery; feet surgery; Back surgery; Hernia repair; Rotator cuff repair; and Melanoma excision. Tobacco:  reports that she has never smoked. She has never used smokeless tobacco. Alcohol:  reports that she does not drink alcohol. Drug:  reports that she does not use drugs.  Constitutional Exam  Vitals: Blood pressure (!) 201/83, pulse 78, temperature 97 F (36.1 C), resp. rate 18, height 5\' 2"  (1.575 m), weight 120 lb (54.4 kg), SpO2 94 %. General appearance: Well  nourished, well developed, and well hydrated. In no acute distress Calculated BMI/Body habitus: Body mass index is 21.95 kg/m. (18.5-24.9 kg/m2) Ideal body weight Psych/Mental status: Alert and oriented x 3 (person, place, & time) Eyes: PERLA Respiratory: No evidence of acute respiratory distress  Cervical Spine Exam  Inspection: No masses, redness, or swelling Alignment: Symmetrical Functional ROM: ROM appears unrestricted Stability: No instability detected Muscle strength & Tone: Functionally intact Sensory: Unimpaired Palpation: Non-contributory  Upper Extremity (UE) Exam    Side: Right upper extremity  Side: Left upper extremity  Inspection: No masses, redness, swelling, or asymmetry  Inspection: No masses, redness, swelling, or asymmetry  Functional ROM: ROM appears unrestricted  Functional ROM: ROM appears unrestricted  Muscle strength & Tone: Functionally intact  Muscle strength & Tone: Functionally intact  Sensory: Unimpaired  Sensory: Unimpaired  Palpation: Non-contributory  Palpation: Non-contributory   Thoracic Spine Exam  Inspection: No masses, redness, or swelling Alignment: Symmetrical Functional ROM: ROM appears unrestricted Stability: No instability detected Sensory: Unimpaired Muscle strength & Tone: Functionally intact Palpation: Non-contributory  Lumbar Spine Exam  Inspection: No masses, redness, or swelling Alignment: Symmetrical Functional ROM: ROM appears unrestricted Stability: No instability detected Muscle strength & Tone: Functionally intact Sensory: Unimpaired Palpation: Non-contributory Provocative Tests: Lumbar  Hyperextension and rotation test: evaluation deferred today       Patrick's Maneuver: evaluation deferred today              Gait & Posture Assessment  Ambulation: Unassisted Gait: Relatively normal for age and body habitus Posture: WNL   Lower Extremity Exam    Side: Right lower extremity  Side: Left lower extremity  Inspection:  No masses, redness, swelling, or asymmetry  Inspection: No masses, redness, swelling, or asymmetry  Functional ROM: ROM appears unrestricted  Functional ROM: ROM appears unrestricted  Muscle strength & Tone: Functionally intact  Muscle strength & Tone: Functionally intact  Sensory: Unimpaired  Sensory: Unimpaired  Palpation: Non-contributory  Palpation: Non-contributory    Assessment & Plan  Primary Diagnosis & Pertinent Problem List: The primary encounter diagnosis was Primary osteoarthritis involving multiple joints. Diagnoses of Chronic pain, Kidney failure, Lumbar spinal stenosis, and Lumbar spondylosis, unspecified spinal osteoarthritis were also pertinent to this visit.  Visit Diagnosis: 1. Primary osteoarthritis involving multiple joints   2. Chronic pain   3. Kidney failure   4. Lumbar spinal stenosis   5. Lumbar spondylosis, unspecified spinal osteoarthritis     Problems updated and reviewed during this visit: Problem  Spinal Stenosis   Overview:  Due to herinated disc by MRI  Overview:  Due to herinated disc by MRI   Gout, Joint  Pure Hypercholesterolemia  Ckd (Chronic Kidney Disease) Stage 4, Gfr 15-29 Ml/Min ([facility])  Heart Murmur, Systolic  Back Ache (Resolved)    Problem-specific Plan(s): No problem-specific Assessment & Plan notes found for this encounter.  No new Assessment & Plan notes have been filed under this hospital service since the last note was generated. Service: Pain Management   Plan of Care   Problem List Items Addressed This Visit      High   Arthritis, degenerative - Primary   Relevant Medications   meloxicam (MOBIC) 15 MG tablet   NONFORMULARY OR COMPOUNDED ITEM   traMADol (ULTRAM) 50 MG tablet   Chronic pain (Chronic)   Relevant Medications   meloxicam (MOBIC) 15 MG tablet   traMADol (ULTRAM) 50 MG tablet   Kidney failure   Lumbar spinal stenosis (Chronic)   Lumbar spondylosis (Chronic)   Relevant Medications   meloxicam  (MOBIC) 15 MG tablet   traMADol (ULTRAM) 50 MG tablet    Other Visit Diagnoses   None.      Pharmacotherapy (Medications Ordered): Meds ordered this encounter  Medications  . NONFORMULARY OR COMPOUNDED ITEM    Sig: 10% Ketoprofen + 4% lidocaine in PLO gel. Sig: Apply to affected area daily to 4 times a day PRN Dispense: 60gm Pump Bottle    Dispense:  1 each    Refill:  0    Do not place this medication, or any other prescription from our practice, on "Automatic Refill". Patient may have prescription filled one day early if pharmacy is closed on scheduled refill date.  . traMADol (ULTRAM) 50 MG tablet    Sig: Take 1 tablet (50 mg total) by mouth every 6 (six) hours as needed for severe pain.    Dispense:  120 tablet    Refill:  2    Do not add this medication to the electronic "Automatic Refill" notification system. Patient may have prescription filled one day early if pharmacy is closed on scheduled refill date. Do not fill until: 07/26/15 To last until: 08/25/15    [facility] & Procedure Ordered: No orders of the defined types were  placed in this encounter.   Imaging Ordered: None  Interventional Therapies: Scheduled:  None at this time.    Considering:  None at this time.    PRN Procedures:  None at this time.    Referral(s) or Consult(s): None at this time.  New Prescriptions   NONFORMULARY OR COMPOUNDED ITEM    10% Ketoprofen + 4% lidocaine in PLO gel. Sig: Apply to affected area daily to 4 times a day PRN Dispense: 60gm Pump Bottle    Medications administered during this visit: Ms. Talford had no medications administered during this visit.  Requested PM Follow-up: Return in about 1 month (around 11/10/2015) for Med-Mgmt.  Future Appointments Date Time Provider [place]  10/13/2015 10:00 AM [facility]  10/17/2015 10:00 AM Lloyd Huger, MD [facility] None  11/09/2015 11:20 AM Milinda Pointer, MD [facility] None  11/15/2015  11:20 AM Minna Merritts, MD [facility]    Primary Care Physician: Dion Body, MD Location: [facility] Note by: Kathlen Brunswick Dossie Arbour, M.D, DABA, DABAPM, DABPM, DABIPP, FIPP  Pain Score Disclaimer: We use the NRS-11 scale. This is a self-reported, subjective measurement of pain severity with only modest accuracy. It is used primarily to identify changes within a particular patient. It must be understood that outpatient pain scales are significantly less accurate that those used for research, where they can be applied under ideal controlled circumstances with minimal exposure to variables. In reality, the score is likely to be a combination of pain intensity and pain affect, where pain affect describes the degree of emotional arousal or changes in action readiness caused by the sensory experience of pain. Factors such as social and work situation, setting, emotional state, anxiety levels, expectation, and prior pain experience may influence pain perception and show large inter-individual differences that may also be affected by time variables.  Patient instructions provided during this appointment: There are no Patient Instructions on file for this visit.

## 2015-10-13 ENCOUNTER — Ambulatory Visit: Payer: Medicare Other

## 2015-10-14 ENCOUNTER — Encounter: Payer: Self-pay | Admitting: Emergency Medicine

## 2015-10-14 ENCOUNTER — Emergency Department: Payer: Medicare Other

## 2015-10-14 ENCOUNTER — Emergency Department
Admission: EM | Admit: 2015-10-14 | Discharge: 2015-10-15 | Disposition: A | Discharge status: Home / Self Care | Payer: Medicare Other | Attending: Student in an Organized Health Care Education/Training Program | Admitting: Student in an Organized Health Care Education/Training Program

## 2015-10-14 DIAGNOSIS — N184 Chronic kidney disease, stage 4 (severe): Secondary | ICD-10-CM | POA: Insufficient documentation

## 2015-10-14 DIAGNOSIS — I10 Essential (primary) hypertension: Secondary | ICD-10-CM

## 2015-10-14 DIAGNOSIS — R600 Localized edema: Secondary | ICD-10-CM | POA: Diagnosis present

## 2015-10-14 DIAGNOSIS — Z85828 Personal history of other malignant neoplasm of skin: Secondary | ICD-10-CM | POA: Diagnosis not present

## 2015-10-14 DIAGNOSIS — L03211 Cellulitis of face: Secondary | ICD-10-CM | POA: Insufficient documentation

## 2015-10-14 DIAGNOSIS — Z7982 Long term (current) use of aspirin: Secondary | ICD-10-CM | POA: Diagnosis not present

## 2015-10-14 DIAGNOSIS — I129 Hypertensive chronic kidney disease with stage 1 through stage 4 chronic kidney disease, or unspecified chronic kidney disease: Secondary | ICD-10-CM | POA: Insufficient documentation

## 2015-10-14 DIAGNOSIS — Z79899 Other long term (current) drug therapy: Secondary | ICD-10-CM | POA: Insufficient documentation

## 2015-10-14 LAB — COMPREHENSIVE METABOLIC PANEL
ALBUMIN: 3.8 g/dL (ref 3.5–5.0)
ALT: 9 U/L — ABNORMAL LOW (ref 14–54)
ANION GAP: 12 (ref 5–15)
AST: 21 U/L (ref 15–41)
Alkaline Phosphatase: 92 U/L (ref 38–126)
BUN: 37 mg/dL — ABNORMAL HIGH (ref 6–20)
CHLORIDE: 103 mmol/L (ref 101–111)
CO2: 24 mmol/L (ref 22–32)
Calcium: 9 mg/dL (ref 8.9–10.3)
Creatinine, Ser: 1.96 mg/dL — ABNORMAL HIGH (ref 0.44–1.00)
GFR calc non Af Amer: 22 mL/min — ABNORMAL LOW (ref >60)
GFR, EST AFRICAN AMERICAN: 25 mL/min — AB (ref >60)
GLUCOSE: 103 mg/dL — AB (ref 65–99)
Potassium: 3.2 mmol/L — ABNORMAL LOW (ref 3.5–5.1)
SODIUM: 139 mmol/L (ref 135–145)
Total Bilirubin: 0.5 mg/dL (ref 0.3–1.2)
Total Protein: 7 g/dL (ref 6.5–8.1)

## 2015-10-14 LAB — CBC WITH DIFFERENTIAL/PLATELET
BASOS ABS: 0 10*3/uL (ref 0–0.1)
Basophils Relative: 0 %
Eosinophils Absolute: 0.2 10*3/uL (ref 0–0.7)
Eosinophils Relative: 2 %
HEMATOCRIT: 36 % (ref 35.0–47.0)
Hemoglobin: 11.9 g/dL — ABNORMAL LOW (ref 12.0–16.0)
LYMPHS PCT: 12 %
Lymphs Abs: 1.1 10*3/uL (ref 1.0–3.6)
MCH: 29.4 pg (ref 26.0–34.0)
MCHC: 33.1 g/dL (ref 32.0–36.0)
MCV: 89 fL (ref 80.0–100.0)
MONO ABS: 0.5 10*3/uL (ref 0.2–0.9)
Monocytes Relative: 6 %
NEUTROS ABS: 7.4 10*3/uL — AB (ref 1.4–6.5)
Neutrophils Relative %: 80 %
Platelets: 241 10*3/uL (ref 150–440)
RBC: 4.05 MIL/uL (ref 3.80–5.20)
RDW: 13.8 % (ref 11.5–14.5)
WBC: 9.3 10*3/uL (ref 3.6–11.0)

## 2015-10-14 LAB — TROPONIN I
Troponin I: 0.05 ng/mL (ref <0.03)
Troponin I: 0.04 ng/mL (ref <0.03)

## 2015-10-14 MED ORDER — METHYLPREDNISOLONE SODIUM SUCC 125 MG IJ SOLR: 125.0000 mg | Freq: Once | INTRAMUSCULAR | Status: DC

## 2015-10-14 MED ORDER — CLINDAMYCIN HCL 300 MG PO CAPS: 300.0000 mg | ORAL_CAPSULE | Freq: Four times a day (QID) | ORAL | 0 refills | Status: AC

## 2015-10-14 MED ORDER — CLINDAMYCIN PHOSPHATE 600 MG/50ML IV SOLN
600.0000 mg | Freq: Once | INTRAVENOUS | Status: AC
  Administered 2015-10-14: 600 mg via INTRAVENOUS
  Filled 2015-10-14: qty 50

## 2015-10-14 MED ORDER — DIPHENHYDRAMINE HCL 50 MG/ML IJ SOLN: 50.0000 mg | Freq: Once | INTRAMUSCULAR | Status: DC

## 2015-10-14 MED ORDER — HYDROCORTISONE NA SUCCINATE PF 100 MG IJ SOLR: 200.0000 mg | Freq: Once | INTRAMUSCULAR | Status: DC

## 2015-10-14 MED ORDER — HYDRALAZINE HCL 20 MG/ML IJ SOLN: 10.0000 mg | Freq: Once | INTRAMUSCULAR | Status: DC

## 2015-10-14 MED ORDER — CLONIDINE HCL 0.1 MG PO TABS
0.1000 mg | ORAL_TABLET | Freq: Once | ORAL | Status: AC
  Administered 2015-10-14: 0.1 mg via ORAL
  Filled 2015-10-14: qty 1

## 2015-10-14 MED ORDER — DIPHENHYDRAMINE HCL 50 MG/ML IJ SOLN: 25.0000 mg | Freq: Once | INTRAMUSCULAR | Status: DC

## 2015-10-14 MED ORDER — SODIUM CHLORIDE 0.9 % IV BOLUS (SEPSIS)
1000.0000 mL | Freq: Once | INTRAVENOUS | Status: AC
  Administered 2015-10-14: 1000 mL via INTRAVENOUS

## 2015-10-14 MED ORDER — DEXAMETHASONE SODIUM PHOSPHATE 10 MG/ML IJ SOLN
4.0000 mg | Freq: Once | INTRAMUSCULAR | Status: AC
  Administered 2015-10-14: 4 mg via INTRAVENOUS
  Filled 2015-10-14: qty 1

## 2015-10-14 NOTE — ED Notes (Signed)
MD at bedside. 

## 2015-10-14 NOTE — ED Triage Notes (Signed)
Pt c/o sore throat that has gotten worse today. Pt had right facial swelling last 2 days r/t abscess tooth. Had tooth removed yesterday by oral surgeon and was started on abx. Swelling in face going down and has had no complaints r/t this but throat is getting worse. Sent from [facility] for fluids r/t dehydration. Creatinine is slightly elevated in labs. Decreased PO intake r/t sore throat

## 2015-10-14 NOTE — ED Provider Notes (Signed)
[facility] Provider Note   CSN: [id number] Arrival date & time: 10/14/15  1619     History   Chief Complaint Chief Complaint  Patient presents with  . Sore Throat    HPI Joanne Michael is a 82 y.o. female history of hypertension, hyperlipidemia, here presenting with right facial swelling, rash. Patient states that she recently had right upper tooth infection and saw multiple dentists and oral surgeon. Eventually she had the right upper molar removed yesterday by oral surgery. She was subsequently put on amoxicillin. She states that since yesterday her right face and neck has been progressively more swollen and painful. She had fever 101 yesterday that resolved today. She states that she has some trouble swallowing her medications.  Also developed rash on her back and legs since yesterday but denies being itchy.   The history is provided by the patient.    Past Medical History:  Diagnosis Date  . Arthritis   . Cancer ([place])    melanoma  . Cat allergies   . Chronic renal failure   . Fatigue   . GERD (gastroesophageal reflux disease)   . Gout   . H/O Clostridium difficile infection   . Hay fever    as child  . Hiatal hernia   . HLD (hyperlipidemia)   . HTN (hypertension)   . Iron deficiency anemia   . Spinal stenosis     Patient Active Problem List   Diagnosis Date Noted  . Gout, joint 10/10/2015  . Pure hypercholesterolemia 10/10/2015  . Chronic hip pain (Bilateral) 07/13/2015  . Posterior arthritis of hip (Bilateral) 07/13/2015  . Chronic sacroiliac joint pain (Bilateral) 07/13/2015  . Osteoarthritis of sacroiliac joint (Bilateral) 07/13/2015  . Lumbar spinal stenosis 07/13/2015  . Acute renal failure (ARF) ([place]) 05/22/2015  . Kidney failure 04/26/2015  . Spinal stenosis 04/18/2015  . Osteoporosis, post-menopausal 04/18/2015  . Arthritis, degenerative 04/18/2015  . 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 Combined fat and  carbohydrate induced hyperlipemia 04/18/2015  . Anemia, iron deficiency 04/18/2015  . Arthritis urica 04/18/2015  . Gastro-esophageal reflux disease without esophagitis 04/18/2015  . Essential (primary) hypertension 04/18/2015  . CKD (chronic kidney disease) stage 4, GFR 15-29 ml/min (HCC) 04/18/2015  . Chronic low back pain (Location of Primary Source of Pain) (Bilateral) (R>L) 04/18/2015  . Lumbar facet syndrome (Location of Primary Source of Pain) (Bilateral) (R>L) 04/18/2015  . Lumbar spondylosis 04/18/2015  . Chronic pain 04/18/2015  . Long term current use of opiate analgesic 04/18/2015  . Long term prescription opiate use 04/18/2015  . Opiate use 04/18/2015  . Encounter for therapeutic drug level monitoring 04/18/2015  . Encounter for chronic pain management 04/18/2015  . Chronic lower extremity pain (referred) (Location of Secondary source of pain) (Bilateral) (R>L) 04/18/2015  . Neurogenic pain 04/18/2015  . Heart murmur, systolic [id number]  . Pulmonary parenchymal mass 12/02/2013  . Lung mass 12/02/2013  . SOB (shortness of breath) 11/03/2013  . Cough 11/03/2013  . Bilateral leg edema 07/22/2013  . Chronic renal insufficiency 07/22/2013  . Aortic valve stenosis 07/22/2013  . MURMUR 05/26/2009  . Hyperlipidemia 05/02/2009  . ESSENTIAL HYPERTENSION, BENIGN 05/02/2009  . SHORTNESS OF BREATH 05/02/2009  . CHEST PAIN UNSPECIFIED 05/02/2009    Past Surgical History:  Procedure Laterality Date  . APPENDECTOMY    . BACK SURGERY    . CATARACT EXTRACTION    . CHOLECYSTECTOMY    . feet surgery    . HERNIA REPAIR    .  knee replacement- both    . MELANOMA EXCISION    . NOSE SURGERY    . ROTATOR CUFF REPAIR     left  . SHOULDER SURGERY    . TONSILLECTOMY    . TOTAL ABDOMINAL HYSTERECTOMY      OB History    No data available       Home Medications    Prior to Admission medications   Medication Sig Start Date End Date Taking? Authorizing Provider  albuterol  (PROVENTIL HFA;VENTOLIN HFA) 108 (90 BASE) MCG/ACT inhaler Inhale 1-2 puffs into the lungs every 6 (six) hours as needed for wheezing or shortness of breath. 02/14/15   Minna Merritts, MD  allopurinol (ZYLOPRIM) 100 MG tablet Take 100 mg by mouth 2 (two) times daily.    Historical Provider, MD  ascorbic acid (VITAMIN C) 1000 MG tablet Take 1,000 mg by mouth daily.    Historical Provider, MD  aspirin EC 81 MG tablet Take 81 mg by mouth daily.    Historical Provider, MD  atorvastatin (LIPITOR) 20 MG tablet Take 20 mg by mouth at bedtime.     Historical Provider, MD  benzonatate (TESSALON) 200 MG capsule Take by mouth. 02/23/15   Historical Provider, MD  Calcium Carbonate-Vitamin D (CALTRATE 600+D) 600-400 MG-UNIT per tablet Take 1 tablet by mouth daily.      Historical Provider, MD  cetirizine (ZYRTEC) 10 MG chewable tablet Chew 10 mg by mouth daily.      Historical Provider, MD  cloNIDine (CATAPRES) 0.1 MG tablet Take 0.1 mg by mouth 2 (two) times daily.     Historical Provider, MD  doxazosin (CARDURA) 8 MG tablet Take 1 tablet (8 mg total) by mouth daily. 08/15/15   Minna Merritts, MD  ferrous sulfate 325 (65 FE) MG tablet Take 325 mg by mouth daily with breakfast.    Historical Provider, MD  fluticasone (FLONASE) 50 MCG/ACT nasal spray Place 2 sprays into both nostrils at bedtime as needed for allergies or rhinitis. 05/25/15   Srikar Sudini, MD  furosemide (LASIX) 20 MG tablet Take 20 mg by mouth daily as needed.    Historical Provider, MD  gabapentin (NEURONTIN) 300 MG capsule Take 1 capsule (300 mg total) by mouth 2 (two) times daily. 04/18/15   Milinda Pointer, MD  meloxicam (MOBIC) 15 MG tablet Take by mouth. 08/31/15 08/30/16  Historical Provider, MD  Multiple Vitamin (MULTIVITAMIN) tablet Take 1 tablet by mouth daily.    Historical Provider, MD  NONFORMULARY OR COMPOUNDED ITEM 10% Ketoprofen + 4% lidocaine in PLO gel. Sig: Apply to affected area daily to 4 times a day PRN Dispense: 60gm Pump  Bottle 10/10/15   Milinda Pointer, MD  pantoprazole (PROTONIX) 40 MG tablet Take 1 tablet (40 mg total) by mouth daily. 02/14/15   Minna Merritts, MD  Probiotic Product ([place]) Take 1 capsule by mouth daily.    Historical Provider, MD  traMADol (ULTRAM) 50 MG tablet Take 1 tablet (50 mg total) by mouth every 6 (six) hours as needed for severe pain. 10/10/15   Milinda Pointer, MD    Family History Family History  Problem Relation Age of Onset  . Ovarian cancer Mother   . Liver cancer Father   . Heart failure Father   . Lung cancer Brother   . Prostate cancer Brother   . Lung cancer Sister     Social History Social History  Substance Use Topics  . Smoking status: Never Smoker  .  Smokeless tobacco: Never Used     Comment: Tobacco use- no   . Alcohol use No     Allergies   Oxycodone; Codeine; Hydrocodone-acetaminophen; Iodine; Meperidine hcl; Other; and Propoxyphene n-acetaminophen   Review of Systems Review of Systems  HENT:       R facial swelling   All other systems reviewed and are negative.    Physical Exam Updated Vital Signs BP (!) 205/59 (BP Location: Right Arm)   Pulse 78   Temp 98.6 F (37 C) (Oral)   Resp 16   Ht 4\' 11"  (1.499 m)   Wt 120 lb (54.4 kg)   SpO2 97%   BMI 24.24 kg/m   Physical Exam  Constitutional: She is oriented to person, place, and time.  Uncomfortable   HENT:  Head: Normocephalic.  MM dry. Posterior pharynx unremarkable. R upper molar tooth missing with some redness, no obvious abscess. R partoid area slightly red and swollen. Some enlarged submandibular lymph nodes. No stridor   Eyes: EOM are normal. Pupils are equal, round, and reactive to light.  Neck: Normal range of motion.  Cardiovascular: Normal rate, regular rhythm and normal heart sounds.   Pulmonary/Chest: Effort normal and breath sounds normal.  Abdominal: Soft. Bowel sounds are normal.  Musculoskeletal: Normal range of motion.  Neurological:  She is alert and oriented to person, place, and time.  Skin: Skin is warm.  Urticaria on the waist and R leg.   Psychiatric: She has a normal mood and affect.  Nursing note and vitals reviewed.    ED Treatments / Results  Labs (all labs ordered are listed, but only abnormal results are displayed) Labs Reviewed  CBC WITH DIFFERENTIAL/PLATELET - Abnormal; Notable for the following:       Result Value   Hemoglobin 11.9 (*)    Neutro Abs 7.4 (*)    All other components within normal limits  COMPREHENSIVE METABOLIC PANEL - Abnormal; Notable for the following:    Potassium 3.2 (*)    Glucose, Bld 103 (*)    BUN 37 (*)    Creatinine, Ser 1.96 (*)    ALT 9 (*)    GFR calc non Af Amer 22 (*)    GFR calc Af Amer 25 (*)    All other components within normal limits  TROPONIN I - Abnormal; Notable for the following:    Troponin I 0.05 (*)    All other components within normal limits  TROPONIN I    EKG  EKG Interpretation None      ED ECG REPORT I, Wandra Arthurs, the attending physician, personally viewed and interpreted this ECG.   Date: 10/14/2015  EKG Time: 20:25 pm  Rate: 76  Rhythm: normal EKG, normal sinus rhythm  Axis: normal  Intervals:none  ST&T Change: nonspecific    Radiology Dg Chest 2 View  Result Date: 10/14/2015 CLINICAL DATA:  Fever, weakness, hypertension EXAM: CHEST  2 VIEW COMPARISON:  05/25/2015 FINDINGS: Cardiomediastinal silhouette is stable. Mild tenting of left posterior hemidiaphragm again noted. No acute infiltrate or pleural effusion. No pulmonary edema. Osteopenia and mild degenerative changes thoracic spine. Atherosclerotic calcifications of thoracic aorta. IMPRESSION: No active cardiopulmonary disease.  No significant change. Electronically Signed   By: Lahoma Crocker M.D.   On: 10/14/2015 20:28   Ct Soft Tissue Neck Wo Contrast  Result Date: 10/14/2015 CLINICAL DATA:  RIGHT facial swelling for 2 days related to an abscess tooth. The tooth was  removed yesterday. Throat swelling is now worse.  EXAM: CT NECK WITHOUT CONTRAST TECHNIQUE: Multidetector CT imaging of the neck was performed following the standard protocol without intravenous contrast. COMPARISON:  None. FINDINGS: Pharynx and larynx: No pharyngeal masses. Airway midline. No retropharyngeal effusion. Negative epiglottis. Unremarkable larynx. Parapharyngeal fat preserved. Salivary glands: Unremarkable. Thyroid: Unremarkable. Lymph nodes: Difficult to assess due to lack of IV contrast. Vascular: Carotid atherosclerosis with calcification at the bifurcation. Limited intracranial: Negative Visualized orbits: BILATERAL cataract extraction. Mastoids and visualized paranasal sinuses: Unremarkable mastoids. Layering fluid in the RIGHT maxillary sinus. RIGHT maxillary molar tooth extraction communicates with the sinus, uncertain significance. RIGHT facial swelling. TMJs are located. Skeleton: Advanced cervical spondylosis. No worrisome osseous lesion. Upper chest: No lung nodule or pneumothorax. IMPRESSION: RIGHT facial swelling, possible cellulitis. Recent RIGHT maxillary molar extraction with layering fluid in the RIGHT maxillary sinus, likely odontogenic in nature. No pharyngeal, retropharyngeal, or parapharyngeal inflammatory process. No epiglottic inflammation or narrowing of the airway. Electronically Signed   By: Staci Righter M.D.   On: 10/14/2015 20:36    Procedures Procedures (including critical care time)  Medications Ordered in ED Medications  clindamycin (CLEOCIN) IVPB 600 mg (not administered)  sodium chloride 0.9 % bolus 1,000 mL (1,000 mLs Intravenous New Bag/Given 10/14/15 2040)  cloNIDine (CATAPRES) tablet 0.1 mg (0.1 mg Oral Given 10/14/15 2039)  dexamethasone (DECADRON) injection 4 mg (4 mg Intravenous Given 10/14/15 2040)     Initial Impression / Assessment and Plan / ED Course  I have reviewed the triage vital signs and the nursing notes.  Pertinent labs & imaging  results that were available during my care of the patient were reviewed by me and considered in my medical decision making (see chart for details).  Clinical Course    Joanne Michael is a 82 y.o. female here with throat swelling and pain, facial swelling, rash. I considered recurrent abscess, parotitis, inflammation after surgery. Given that she just got put on amoxicillin, consider allergic reaction to abx as well. Her baseline Cr is around 2 so won't be able to get CT with contrast so will get CT neck noncontrast. Will check labs and give steroids, benadryl. Patient hypertensive but denies chest pain or shortness of breath and couldn't swallow BP meds. Will give BP meds and reassess.   8:50 PM Labs showed nl WBC, baseline Cr at 1.9. She is able to keep down clonidine. CT showed possible R facial cellulitis, no obvious abscess. Trop 0.05 but has no chest pain. Likely demand ischemia from hypertension. Rash improved with benadryl and decadron. Ordered IV clinda. Will repeat trop around 10:15 pm and if stable or less than 0.05 can be discharged as patient has no chest pain. Signed out to Dr. Quentin Cornwall in the ED.    Final Clinical Impressions(s) / ED Diagnoses   Final diagnoses:  None    New Prescriptions New Prescriptions   No medications on file     Drenda Freeze, MD 10/14/15 2053

## 2015-10-14 NOTE — ED Notes (Signed)
Pts family reports pt has had a fever since yesterday and has continues to take antibiotics that were prescribed after tooth was removed but report that pt has stopped taking all other medications, including BP medication,  Due to pain with swallowing.

## 2015-10-14 NOTE — ED Provider Notes (Signed)
Patient received in signout from Dr. Darl Householder.  Patient with facial cellulitis status post procedure. Patient was loaded with IV Clinda. Patient had elevated blood pressure due to not being able to tolerate her by mouth clonidine. In the ED she was given a dose of clonidine with improvement in her blood pressure. She has not had any chest pain or shortness of breath. On my reevaluation at this time the patient remains afebrile hemodynamically stable and asymptomatic at this time. Patient requesting discharge home. Instructed the patient that we're awaiting a repeat troponin levels to evaluate for any worsening demand ischemia. Patient agreeable to wait for this repeat test.  ----------------------------------------- 11:28 PM on 10/14/2015 -----------------------------------------  Troponin downtrending. Blood pressure is markedly improved from previous. Patient states that she feels well and is requesting discharge home. She remains stable with no active chest pain or shortness of breath feel this is appropriate. Discussed signs and symptoms for which the patient should return to the ER. Patient also advised to have close follow up with dentist.  Patient was able to tolerate PO and was able to ambulate with a steady gait.  Have discussed with the patient and available family all diagnostics and treatments performed thus far and all questions were answered to the best of my ability. The patient demonstrates understanding and agreement with plan.    Merlyn Lot, MD 10/14/15 2329

## 2015-10-15 NOTE — ED Notes (Signed)
Reviewed d/c instructions, follow-up care, and prescriptions with pt and family (POA). Pt and family verbalized understanding

## 2015-10-17 ENCOUNTER — Inpatient Hospital Stay: Payer: Medicare Other | Admitting: Oncology

## 2015-11-09 ENCOUNTER — Encounter: Payer: Medicare Other | Admitting: Pain Medicine

## 2015-11-15 ENCOUNTER — Ambulatory Visit (INDEPENDENT_AMBULATORY_CARE_PROVIDER_SITE_OTHER): Payer: Medicare Other | Admitting: Cardiovascular Disease

## 2015-11-15 ENCOUNTER — Encounter: Payer: Self-pay | Admitting: Cardiovascular Disease

## 2015-11-15 VITALS — BP 140/62 | HR 65 | Ht 62.0 in | Wt 111.8 lb

## 2015-11-15 DIAGNOSIS — R0989 Other specified symptoms and signs involving the circulatory and respiratory systems: Secondary | ICD-10-CM

## 2015-11-15 DIAGNOSIS — E7800 Pure hypercholesterolemia, unspecified: Secondary | ICD-10-CM

## 2015-11-15 DIAGNOSIS — I350 Nonrheumatic aortic (valve) stenosis: Secondary | ICD-10-CM | POA: Diagnosis not present

## 2015-11-15 DIAGNOSIS — I10 Essential (primary) hypertension: Secondary | ICD-10-CM | POA: Diagnosis not present

## 2015-11-15 DIAGNOSIS — N184 Chronic kidney disease, stage 4 (severe): Secondary | ICD-10-CM

## 2015-11-15 NOTE — Patient Instructions (Addendum)
Medication Instructions:   No medication changes made  Monitor blood pressure Try to keep the top number less than 135  Labwork:  No new labs needed  Testing/Procedures:  We will order a carotid ultrasound for bruit bilaterally   Follow-Up: It was a pleasure seeing you in the office today. Please call us if you have new issues that need to be addressed before your next appt.  [phone]  Your physician wants you to follow-up in: 6 months.  You will receive a reminder letter in the mail two months in advance. If you don't receive a letter, please call our office to schedule the follow-up appointment.  If you need a refill on your cardiac medications before your next appointment, please call your pharmacy.

## 2015-11-15 NOTE — Progress Notes (Signed)
Patient ID: Joanne Michael, female   DOB: [birth year], 82 y.o.   MRN: [id number] Cardiology Office Note  Date:  11/15/2015   ID:  Joanne Michael, DOB 03/02/1924, MRN [id number]  PCP:  Dion Body, MD   Chief Complaint  Patient presents with  . other    3 month follow up. Meds reviewed by the pt. verbally. Pt. c/o shortness of breath, she was at [facility] one month ago with dehydration.     HPI:  Joanne Michael is a pleasant 82 yo woman with mild aortic valve stenosis in 2011, GERD, hyperlipidemia, hypertension who presented for lower extremity edema that started in December 2014, with improved symptoms after amlodipine was held She presents today for follow-up of her blood pressure, Previous episode of episode of near syncope  In follow-up today, she reports that she feels well On 10/14/2015 she had a Tooth pulled, got cellulitis Required antibiotics  Creatinine improved since the beginning of 2017,  Recently seen by Dr. Candiss Norse, told she had Worsening renal function Most recent lab work not available Most recent creatinine in our system 1.9 She continues to take Lasix 20 mg daily prn, denies any leg edema  She does not monitor her blood pressure at home  Other past medical history reviewed Previous leg edema resolved by holding amlodipine Denies having any further episodes of near syncope or syncope  08/08/2014 she was at church when she developed syncope/near syncope. She was in a sitting position. Reports indicate low blood pressure. She was taken to the emergency room, lab work showed elevated BUN and creatinine consistent with dehydration. She was given fluids with improvement of her symptoms. She denied any recent illnesses. Several days prior she had radiofrequency ablation of her back.   Lab work from primary care shows creatinine 2.0, GFR in the 20s she was previously on lisinopril and HCTZ, pill. This was held and she was changed to amlodipine Laboratory from 07/08/2011  shows creatinine 2.1, GFR 22, BUN 21, potassium 3.3  Prior echocardiogram in 2011 showing normal LV systolic function, diastolic relaxation abnormality, mild aortic valve stenosis with no significant gradient, mild MR, high normal right ventricular systolic pressures  PMH:   has a past medical history of Arthritis; Cancer ([place]); Cat allergies; Chronic renal failure; Fatigue; GERD (gastroesophageal reflux disease); Gout; H/O Clostridium difficile infection; Hay fever; Hiatal hernia; HLD (hyperlipidemia); HTN (hypertension); Iron deficiency anemia; and Spinal stenosis.  PSH:    Past Surgical History:  Procedure Laterality Date  . APPENDECTOMY    . BACK SURGERY    . CATARACT EXTRACTION    . CHOLECYSTECTOMY    . feet surgery    . HERNIA REPAIR    . knee replacement- both    . MELANOMA EXCISION    . NOSE SURGERY    . ROTATOR CUFF REPAIR     left  . SHOULDER SURGERY    . TONSILLECTOMY    . TOTAL ABDOMINAL HYSTERECTOMY      Current Outpatient Prescriptions  Medication Sig Dispense Refill  . albuterol (PROVENTIL HFA;VENTOLIN HFA) 108 (90 BASE) MCG/ACT inhaler Inhale 1-2 puffs into the lungs every 6 (six) hours as needed for wheezing or shortness of breath. 1 Inhaler 6  . allopurinol (ZYLOPRIM) 100 MG tablet Take 100 mg by mouth 2 (two) times daily.    Marland Kitchen ascorbic acid (VITAMIN C) 1000 MG tablet Take 1,000 mg by mouth daily.    Marland Kitchen aspirin EC 81 MG tablet Take 81 mg by mouth daily.    Marland Kitchen  atorvastatin (LIPITOR) 20 MG tablet Take 20 mg by mouth at bedtime.     . benzonatate (TESSALON) 200 MG capsule Take by mouth.    . Calcium Carbonate-Vitamin D (CALTRATE 600+D) 600-400 MG-UNIT per tablet Take 1 tablet by mouth daily.      . cetirizine (ZYRTEC) 10 MG chewable tablet Chew 10 mg by mouth daily.      . cloNIDine (CATAPRES) 0.1 MG tablet Take 0.1 mg by mouth 2 (two) times daily.     Marland Kitchen doxazosin (CARDURA) 8 MG tablet Take 1 tablet (8 mg total) by mouth daily. 30 tablet 6  . ferrous sulfate 325 (65  FE) MG tablet Take 325 mg by mouth daily with breakfast.    . fluticasone (FLONASE) 50 MCG/ACT nasal spray Place 2 sprays into both nostrils at bedtime as needed for allergies or rhinitis. 16 g 0  . gabapentin (NEURONTIN) 300 MG capsule Take 1 capsule (300 mg total) by mouth 2 (two) times daily. 60 capsule 2  . meloxicam (MOBIC) 15 MG tablet Take by mouth.    . Multiple Vitamin (MULTIVITAMIN) tablet Take 1 tablet by mouth daily.    . NONFORMULARY OR COMPOUNDED ITEM 10% Ketoprofen + 4% lidocaine in PLO gel. Sig: Apply to affected area daily to 4 times a day PRN Dispense: 60gm Pump Bottle 1 each 0  . pantoprazole (PROTONIX) 40 MG tablet Take 1 tablet (40 mg total) by mouth daily. 30 tablet 11  . Probiotic Product ([place] [facility] PO) Take 1 capsule by mouth daily.    . traMADol (ULTRAM) 50 MG tablet Take 1 tablet (50 mg total) by mouth every 6 (six) hours as needed for severe pain. 120 tablet 2   No current facility-administered medications for this visit.      Allergies:   Oxycodone; Codeine; Hydrocodone-acetaminophen; Iodine; Meperidine hcl; Other; and Propoxyphene n-acetaminophen   Social History:  The patient  reports that she has never smoked. She has never used smokeless tobacco. She reports that she does not drink alcohol or use drugs.   Family History:   family history includes Heart failure in her father; Liver cancer in her father; Lung cancer in her brother and sister; Ovarian cancer in her mother; Prostate cancer in her brother.    Review of Systems: Review of Systems  Constitutional: Negative.   Respiratory: Negative.   Cardiovascular: Negative.   Gastrointestinal: Negative.   Musculoskeletal: Negative.        Chronic leg pain  Neurological: Negative.   Psychiatric/Behavioral: Negative.   All other systems reviewed and are negative.    PHYSICAL EXAM: VS:  BP 140/62 (BP Location: Left Arm, Patient Position: Sitting, Cuff Size: Normal)   Pulse 65   Ht 5\' 2"   (1.575 m)   Wt 111 lb 12 oz (50.7 kg)   BMI 20.44 kg/m  , BMI Body mass index is 20.44 kg/m. GEN: Well nourished, well developed, in no acute distress  HEENT: normal  Neck: no JVD, 2+ carotid bruits L>R, no masses Cardiac: RRR; 2+ SEM RSB,  No rubs, or gallops,no edema  Respiratory:  clear to auscultation bilaterally, normal work of breathing GI: soft, nontender, nondistended, + BS Joanne: no deformity or atrophy  Skin: warm and dry, no rash Neuro:  Strength and sensation are intact Psych: euthymic mood, full affect    Recent Labs: 04/18/2015: Magnesium 2.1 10/14/2015: ALT 9; BUN 37; Creatinine, Ser 1.96; Hemoglobin 11.9; Platelets 241; Potassium 3.2; Sodium 139    Lipid Panel Lab Results  Component Value Date   CHOL 167 09/23/2013   HDL 56 09/23/2013   LDLCALC 92 09/23/2013   TRIG 94 09/23/2013      Wt Readings from Last 3 Encounters:  11/15/15 111 lb 12 oz (50.7 kg)  10/14/15 120 lb (54.4 kg)  10/10/15 120 lb (54.4 kg)       ASSESSMENT AND PLAN:  Essential hypertension - Plan: EKG 12-Lead Encourage her distended her current medications, monitor her blood pressure at home as it is borderline elevated today Other options for blood pressure medications include hydralazine and isosorbide Will not increase the clonidine given her bradycardia We will avoid ACE inhibitor, ARB, calcium channel blockers (prior leg swelling)  Hyperlipidemia Encouraged her to stay on her Lipitor  Aortic valve stenosis Mild aortic valve stenosis   periodically with echo   Chronic pain Managed in the pain clinic  Carotid bruit Unclear if there is underlying disease, possible radiation of sound from her aortic valve stenosis Carotid ultrasound has been ordered  Chronic renal insufficiency Most recent lab work not available, suggested she try to limit use of Lasix as she has no leg edema, shortness of breath or signs of diastolic CHF Encouraged her to drink more fluids   Total  encounter time more than 25 minutes  Greater than 50% was spent in counseling and coordination of care with the patient    Disposition:   F/U  6 months    Signed, Esmond Plants, M.D., Ph.D. 11/15/2015  [place], [place]

## 2015-11-17 ENCOUNTER — Encounter: Payer: Self-pay | Admitting: Pain Medicine

## 2015-11-17 ENCOUNTER — Ambulatory Visit: Payer: Medicare Other | Attending: Pain Medicine | Admitting: Pain Medicine

## 2015-11-17 VITALS — BP 162/44 | HR 62 | Temp 98.7°F | Resp 16 | Ht 60.0 in | Wt 113.0 lb

## 2015-11-17 DIAGNOSIS — G629 Polyneuropathy, unspecified: Secondary | ICD-10-CM | POA: Insufficient documentation

## 2015-11-17 DIAGNOSIS — D509 Iron deficiency anemia, unspecified: Secondary | ICD-10-CM | POA: Diagnosis not present

## 2015-11-17 DIAGNOSIS — Z79891 Long term (current) use of opiate analgesic: Secondary | ICD-10-CM | POA: Insufficient documentation

## 2015-11-17 DIAGNOSIS — G8929 Other chronic pain: Secondary | ICD-10-CM | POA: Insufficient documentation

## 2015-11-17 DIAGNOSIS — M545 Low back pain: Secondary | ICD-10-CM | POA: Insufficient documentation

## 2015-11-17 DIAGNOSIS — I129 Hypertensive chronic kidney disease with stage 1 through stage 4 chronic kidney disease, or unspecified chronic kidney disease: Secondary | ICD-10-CM | POA: Insufficient documentation

## 2015-11-17 DIAGNOSIS — M150 Primary generalized (osteo)arthritis: Secondary | ICD-10-CM

## 2015-11-17 DIAGNOSIS — M25552 Pain in left hip: Secondary | ICD-10-CM | POA: Diagnosis not present

## 2015-11-17 DIAGNOSIS — M159 Polyosteoarthritis, unspecified: Secondary | ICD-10-CM

## 2015-11-17 DIAGNOSIS — Z79899 Other long term (current) drug therapy: Secondary | ICD-10-CM | POA: Insufficient documentation

## 2015-11-17 DIAGNOSIS — M542 Cervicalgia: Secondary | ICD-10-CM | POA: Diagnosis not present

## 2015-11-17 DIAGNOSIS — M4806 Spinal stenosis, lumbar region: Secondary | ICD-10-CM | POA: Insufficient documentation

## 2015-11-17 DIAGNOSIS — N179 Acute kidney failure, unspecified: Secondary | ICD-10-CM | POA: Diagnosis not present

## 2015-11-17 DIAGNOSIS — R918 Other nonspecific abnormal finding of lung field: Secondary | ICD-10-CM | POA: Diagnosis not present

## 2015-11-17 DIAGNOSIS — I6529 Occlusion and stenosis of unspecified carotid artery: Secondary | ICD-10-CM | POA: Insufficient documentation

## 2015-11-17 DIAGNOSIS — M25551 Pain in right hip: Secondary | ICD-10-CM | POA: Insufficient documentation

## 2015-11-17 DIAGNOSIS — M1991 Primary osteoarthritis, unspecified site: Secondary | ICD-10-CM | POA: Diagnosis not present

## 2015-11-17 DIAGNOSIS — M792 Neuralgia and neuritis, unspecified: Secondary | ICD-10-CM | POA: Diagnosis not present

## 2015-11-17 DIAGNOSIS — Z7982 Long term (current) use of aspirin: Secondary | ICD-10-CM | POA: Diagnosis not present

## 2015-11-17 DIAGNOSIS — E7801 Familial hypercholesterolemia: Secondary | ICD-10-CM | POA: Diagnosis not present

## 2015-11-17 DIAGNOSIS — M109 Gout, unspecified: Secondary | ICD-10-CM | POA: Diagnosis not present

## 2015-11-17 MED ORDER — NONFORMULARY OR COMPOUNDED ITEM: 5 refills | Status: DC

## 2015-11-17 MED ORDER — GABAPENTIN 300 MG PO CAPS: 300.0000 mg | ORAL_CAPSULE | Freq: Every day | ORAL | 5 refills | Status: DC

## 2015-11-17 NOTE — Progress Notes (Signed)
Safety precautions to be maintained throughout the outpatient stay will include: orient to surroundings, keep bed in low position, maintain call bell within reach at all times, provide assistance with transfer out of bed and ambulation.Pill count - patient did not bring pill bottles.

## 2015-11-17 NOTE — Progress Notes (Signed)
Patient's Name: Joanne Michael  MRN: [id number]  Referring Provider: Dion Body, MD  DOB: 06/13/1924  PCP: Dion Body, MD  DOS: 11/17/2015  Note by: Kathlen Brunswick. Dossie Arbour, MD  Service setting: Ambulatory outpatient  Specialty: Interventional Pain Management  Location: [facility] (AMB) Pain Management Facility    Patient type: Established   Primary Reason(s) for Visit: Encounter for prescription drug management (Level of risk: moderate) CC: Back Pain (lower) and Neck Pain (at base of neck in back)  HPI  [name] is a 83 y.o. year old, female patient, who comes today for an initial evaluation. 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 [facility]); Combined fat and carbohydrate induced hyperlipemia; Anemia, iron deficiency; Heart murmur, systolic; Arthritis urica; Gastro-esophageal reflux disease without esophagitis; Essential (primary) hypertension; CKD (chronic kidney disease) stage 4, GFR 15-29 ml/min ([place]); Chronic low back pain (Location of Primary Source of Pain) (Bilateral) (R>L); Lumbar facet syndrome (Location of Primary Source of Pain) (Bilateral) (R>L); Lumbar spondylosis; Chronic pain; Long term current use of opiate analgesic; Long term prescription opiate use; Opiate use; Encounter for therapeutic drug level monitoring; Encounter for chronic pain management; Chronic lower extremity pain (referred) (Location of Secondary source of pain) (Bilateral) (R>L); Neurogenic pain; Kidney failure; Acute renal failure (ARF) ([place]); Chronic hip pain (Bilateral); Posterior arthritis of hip (Bilateral); Chronic sacroiliac joint pain (Bilateral); Osteoarthritis of sacroiliac joint  (Bilateral); Lumbar spinal stenosis; Gout, joint; Pure hypercholesterolemia; and Iron deficiency anemia on her problem list.. Her primarily concern today is the Back Pain (lower) and Neck Pain (at base of neck in back)  Pain Assessment: Self-Reported Pain Score: 5 /10 Clinically the patient looks like a 2/10 Reported level is inconsistent with clinical observations. Information on the proper use of the pain score provided to the patient today. Pain Type: Chronic pain Pain Location: Back Pain Orientation: Lower Pain Descriptors / Indicators: Aching, Constant, Shooting Pain Frequency: Constant  The patient comes into the clinics today for pharmacological management of her chronic pain. I last saw this patient on 10/10/2015. The patient  reports that she does not use drugs. Her body mass index is 22.07 kg/m.  Date of Last Visit: 10/10/15 Service Provided on Last Visit: Med Refill  Controlled Substance Pharmacotherapy Assessment & REMS (Risk Evaluation and Mitigation Strategy)  Analgesic:Tramadol 50 mg every 6 hours when necessary for pain (200 mg/day) MME/day:20 mg/day Pill Count: patient did not bring pill bottles. Pharmacokinetics: Onset of action (Liberation/Absorption): Within expected pharmacological parameters Time to Peak effect (Distribution): Timing and results are as within normal expected parameters Duration of action (Metabolism/Excretion): Within normal limits for medication Pharmacodynamics: Analgesic Effect: More than 50% Activity Facilitation: Medication(s) allow patient to sit, stand, walk, and do the basic ADLs Perceived Effectiveness: Described as relatively effective, allowing for increase in activities of daily living (ADL) Side-effects or Adverse reactions: None reported Monitoring: [place] PMP: Online review of the past 20-month period conducted. Compliant with practice rules and regulations List of all UDS test(s) done:  Lab Results  Component Value Date    TOXASSSELUR FINAL 04/18/2015   Last UDS on record: ToxAssure Select 13  Date Value Ref Range Status  04/18/2015 FINAL  Final    Comment:    ==================================================================== TOXASSURE SELECT 13 (MW) ==================================================================== Test  Result       Flag       Units Drug Present not Declared for Prescription Verification   Tramadol                       PRESENT      UNEXPECTED   O-Desmethyltramadol            PRESENT      UNEXPECTED    Source of tramadol is a prescription medication.    O-desmethyltramadol is an expected metabolite of tramadol. ==================================================================== Test                      Result    Flag   Units      Ref Range   Creatinine              86               mg/dL      >=20 ==================================================================== Declared Medications:  The flagging and interpretation on this report are based on the  following declared medications.  Unexpected results may arise from  inaccuracies in the declared medications.  **Note: The testing scope of this panel does not include following  reported medications:  Gabapentin ==================================================================== For clinical consultation, please call [phone]. ====================================================================    UDS interpretation: Compliant          Medication Assessment Form: Reviewed. Patient indicates being compliant with therapy Treatment compliance: Compliant Risk Assessment: Aberrant Behavior: None observed today Substance Use Disorder (SUD) Risk Level: No change since last visit Risk of opioid abuse or dependence: 0.7-3.0% with doses ? 36 MME/day and 6.1-26% with doses ? 120 MME/day. Opioid Risk Tool (ORT) Score: 0 Low Risk for SUD (Score <3) Depression Scale Score: PHQ-2: 0 No depression  (0) PHQ-9: 0 No depression (0-4)  Pharmacologic Plan: No change in therapy, at this time  Laboratory Chemistry  Inflammation Markers Lab Results  Component Value Date   ESRSEDRATE 63 (H) 04/18/2015   CRP <0.5 04/18/2015   Renal Function Lab Results  Component Value Date   BUN 37 (H) 10/14/2015   CREATININE 1.96 (H) 10/14/2015   GFRAA 25 (L) 10/14/2015   GFRNONAA 22 (L) 10/14/2015   Hepatic Function Lab Results  Component Value Date   AST 21 10/14/2015   ALT 9 (L) 10/14/2015   ALBUMIN 3.8 10/14/2015   Electrolytes Lab Results  Component Value Date   NA 139 10/14/2015   K 3.2 (L) 10/14/2015   CL 103 10/14/2015   CALCIUM 9.0 10/14/2015   MG 2.1 04/18/2015   Pain Modulating Vitamins No results found for: Maralyn Sago [id number], [id number], [id number], 25OHVITD1, 25OHVITD2, 25OHVITD3, VITAMINB12 Coagulation Parameters Lab Results  Component Value Date   INR 0.9 09/23/2013   LABPROT 11.9 09/23/2013   APTT 29.0 09/23/2013   PLT 241 10/14/2015   Cardiovascular Lab Results  Component Value Date   HGB 11.9 (L) 10/14/2015   HCT 36.0 10/14/2015   Note: Lab results reviewed.  Recent Diagnostic Imaging  Dg Chest 2 View  Result Date: 10/14/2015 CLINICAL DATA:  Fever, weakness, hypertension EXAM: CHEST  2 VIEW COMPARISON:  05/25/2015 FINDINGS: Cardiomediastinal silhouette is stable. Mild tenting of left posterior hemidiaphragm again noted. No acute infiltrate or pleural effusion. No pulmonary edema. Osteopenia and mild degenerative changes thoracic spine. Atherosclerotic calcifications of thoracic aorta. IMPRESSION: No active cardiopulmonary disease.  No significant change. Electronically Signed   By: Lahoma Crocker M.D.   On:  10/14/2015 20:28   Ct Soft Tissue Neck Wo Contrast  Result Date: 10/14/2015 CLINICAL DATA:  RIGHT facial swelling for 2 days related to an abscess tooth. The tooth was removed yesterday. Throat swelling is now worse. EXAM: CT NECK WITHOUT CONTRAST TECHNIQUE:  Multidetector CT imaging of the neck was performed following the standard protocol without intravenous contrast. COMPARISON:  None. FINDINGS: Pharynx and larynx: No pharyngeal masses. Airway midline. No retropharyngeal effusion. Negative epiglottis. Unremarkable larynx. Parapharyngeal fat preserved. Salivary glands: Unremarkable. Thyroid: Unremarkable. Lymph nodes: Difficult to assess due to lack of IV contrast. Vascular: Carotid atherosclerosis with calcification at the bifurcation. Limited intracranial: Negative Visualized orbits: BILATERAL cataract extraction. Mastoids and visualized paranasal sinuses: Unremarkable mastoids. Layering fluid in the RIGHT maxillary sinus. RIGHT maxillary molar tooth extraction communicates with the sinus, uncertain significance. RIGHT facial swelling. TMJs are located. Skeleton: Advanced cervical spondylosis. No worrisome osseous lesion. Upper chest: No lung nodule or pneumothorax. IMPRESSION: RIGHT facial swelling, possible cellulitis. Recent RIGHT maxillary molar extraction with layering fluid in the RIGHT maxillary sinus, likely odontogenic in nature. No pharyngeal, retropharyngeal, or parapharyngeal inflammatory process. No epiglottic inflammation or narrowing of the airway. Electronically Signed   By: Staci Righter M.D.   On: 10/14/2015 20:36    Meds  The patient has a current medication list which includes the following prescription(s): albuterol, allopurinol, ascorbic acid, aspirin ec, atorvastatin, benzonatate, calcium carbonate-vitamin d, cetirizine, clonidine, doxazosin, ferrous sulfate, fluticasone, gabapentin, meloxicam, multivitamin, NONFORMULARY OR COMPOUNDED ITEM, pantoprazole, probiotic product, and tramadol.  Current Outpatient Prescriptions on File Prior to Visit  Medication Sig  . albuterol (PROVENTIL HFA;VENTOLIN HFA) 108 (90 BASE) MCG/ACT inhaler Inhale 1-2 puffs into the lungs every 6 (six) hours as needed for wheezing or shortness of breath.  .  allopurinol (ZYLOPRIM) 100 MG tablet Take 100 mg by mouth 2 (two) times daily.  Marland Kitchen ascorbic acid (VITAMIN C) 1000 MG tablet Take 1,000 mg by mouth daily.  Marland Kitchen aspirin EC 81 MG tablet Take 81 mg by mouth daily.  Marland Kitchen atorvastatin (LIPITOR) 20 MG tablet Take 20 mg by mouth at bedtime.   . benzonatate (TESSALON) 200 MG capsule Take by mouth.  . Calcium Carbonate-Vitamin D (CALTRATE 600+D) 600-400 MG-UNIT per tablet Take 1 tablet by mouth daily.    . cetirizine (ZYRTEC) 10 MG chewable tablet Chew 10 mg by mouth daily.    . cloNIDine (CATAPRES) 0.1 MG tablet Take 0.1 mg by mouth 2 (two) times daily.   Marland Kitchen doxazosin (CARDURA) 8 MG tablet Take 1 tablet (8 mg total) by mouth daily.  . ferrous sulfate 325 (65 FE) MG tablet Take 325 mg by mouth daily with breakfast.  . fluticasone (FLONASE) 50 MCG/ACT nasal spray Place 2 sprays into both nostrils at bedtime as needed for allergies or rhinitis.  . meloxicam (MOBIC) 15 MG tablet Take by mouth.  . Multiple Vitamin (MULTIVITAMIN) tablet Take 1 tablet by mouth daily.  . pantoprazole (PROTONIX) 40 MG tablet Take 1 tablet (40 mg total) by mouth daily.  . Probiotic Product (PHILLIPS COLON HEALTH PO) Take 1 capsule by mouth daily.  . traMADol (ULTRAM) 50 MG tablet Take 1 tablet (50 mg total) by mouth every 6 (six) hours as needed for severe pain.   No current facility-administered medications on file prior to visit.     ROS  Constitutional: Denies any fever or chills Gastrointestinal: No reported hemesis, hematochezia, vomiting, or acute GI distress Musculoskeletal: Denies any acute onset joint swelling, redness, loss of ROM, or weakness Neurological: No  reported episodes of acute onset apraxia, aphasia, dysarthria, agnosia, amnesia, paralysis, loss of coordination, or loss of consciousness  Allergies  Ms. Totten is allergic to oxycodone; codeine; hydrocodone-acetaminophen; iodine; meperidine hcl; other; and propoxyphene n-acetaminophen.  [place]  Medical:  Ms.  Kendricks  has a past medical history of Arthritis; Cancer ([place]); Cat allergies; Chronic renal failure; Fatigue; GERD (gastroesophageal reflux disease); Gout; H/O Clostridium difficile infection; Hay fever; Hiatal hernia; HLD (hyperlipidemia); HTN (hypertension); Iron deficiency anemia; and Spinal stenosis. Family: family history includes Heart failure in her father; Liver cancer in her father; Lung cancer in her brother and sister; Ovarian cancer in her mother; Prostate cancer in her brother. Surgical:  has a past surgical history that includes Cataract extraction; Tonsillectomy; Appendectomy; Total abdominal hysterectomy; knee replacement- both; Cholecystectomy; Nose surgery; Shoulder surgery; feet surgery; Back surgery; Hernia repair; Rotator cuff repair; and Melanoma excision. Tobacco:  reports that she has never smoked. She has never used smokeless tobacco. Alcohol:  reports that she does not drink alcohol. Drug:  reports that she does not use drugs.  Constitutional Exam  General appearance: Well nourished, well developed, and well hydrated. In no acute distress Vitals:   11/17/15 1416  BP: (!) 162/44  Pulse: 62  Resp: 16  Temp: 98.7 F (37.1 C)  TempSrc: Oral  SpO2: 99%  Weight: 113 lb (51.3 kg)  Height: 5' (1.524 m)  BMI Assessment: Estimated body mass index is 22.07 kg/m as calculated from the following:   Height as of this encounter: 5' (1.524 m).   Weight as of this encounter: 113 lb (51.3 kg).   BMI interpretation: (18.5-24.9 kg/m2) = Ideal body weight BMI Readings from Last 4 Encounters:  11/17/15 22.07 kg/m  11/15/15 20.44 kg/m  10/14/15 24.24 kg/m  10/10/15 21.95 kg/m   Wt Readings from Last 4 Encounters:  11/17/15 113 lb (51.3 kg)  11/15/15 111 lb 12 oz (50.7 kg)  10/14/15 120 lb (54.4 kg)  10/10/15 120 lb (54.4 kg)  Psych/Mental status: Alert and oriented x 3 (person, place, & time) Eyes: PERLA Respiratory: No evidence of acute respiratory distress  Cervical  Spine Exam  Inspection: No masses, redness, or swelling Alignment: Symmetrical Functional ROM: ROM appears unrestricted Stability: No instability detected Muscle strength & Tone: Functionally intact Sensory: Unimpaired Palpation: Non-contributory  Upper Extremity (UE) Exam    Side: Right upper extremity  Side: Left upper extremity  Inspection: No masses, redness, swelling, or asymmetry  Inspection: No masses, redness, swelling, or asymmetry  Functional ROM: ROM appears unrestricted          Functional ROM: ROM appears unrestricted          Muscle strength & Tone: Functionally intact  Muscle strength & Tone: Functionally intact  Sensory: Unimpaired  Sensory: Unimpaired  Palpation: Non-contributory  Palpation: Non-contributory   Thoracic Spine Exam  Inspection: No masses, redness, or swelling Alignment: Symmetrical Functional ROM: ROM appears unrestricted Stability: No instability detected Sensory: Unimpaired Muscle strength & Tone: Functionally intact Palpation: Non-contributory  Lumbar Spine Exam  Inspection: No masses, redness, or swelling Alignment: Symmetrical Functional ROM: ROM appears unrestricted Stability: No instability detected Muscle strength & Tone: Functionally intact Sensory: Unimpaired Palpation: Non-contributory Provocative Tests: Lumbar Hyperextension and rotation test: evaluation deferred today       Patrick's Maneuver: evaluation deferred today              Gait & Posture Assessment  Ambulation: Unassisted Gait: Relatively normal for age and body habitus Posture: WNL   Lower Extremity  Exam    Side: Right lower extremity  Side: Left lower extremity  Inspection: No masses, redness, swelling, or asymmetry  Inspection: No masses, redness, swelling, or asymmetry  Functional ROM: ROM appears unrestricted          Functional ROM: ROM appears unrestricted          Muscle strength & Tone: Functionally intact  Muscle strength & Tone: Functionally intact   Sensory: Unimpaired  Sensory: Unimpaired  Palpation: Non-contributory  Palpation: Non-contributory   Assessment  Primary Diagnosis & Pertinent Problem List: The primary encounter diagnosis was Chronic pain. Diagnoses of Long term current use of opiate analgesic, Neuropathic pain, and Primary osteoarthritis involving multiple joints were also pertinent to this visit.  Visit Diagnosis: 1. Chronic pain   2. Long term current use of opiate analgesic   3. Neuropathic pain   4. Primary osteoarthritis involving multiple joints    Plan of Care   Problem List Items Addressed This Visit      High   Arthritis, degenerative   Relevant Medications   NONFORMULARY OR COMPOUNDED ITEM   Chronic pain - Primary (Chronic)   Relevant Medications   gabapentin (NEURONTIN) 300 MG capsule     Medium   Long term current use of opiate analgesic (Chronic)    Other Visit Diagnoses    Neuropathic pain  (Chronic)      Relevant Medications   gabapentin (NEURONTIN) 300 MG capsule     Pharmacotherapy (Medications Ordered): Meds ordered this encounter  Medications  . gabapentin (NEURONTIN) 300 MG capsule    Sig: Take 1 capsule (300 mg total) by mouth at bedtime.    Dispense:  30 capsule    Refill:  5    Do not place this medication, or any other prescription from our practice, on "Automatic Refill". Patient may have prescription filled one day early if pharmacy is closed on scheduled refill date.  . NONFORMULARY OR COMPOUNDED ITEM    Sig: 10% Ketoprofen + 4% lidocaine in PLO gel. Sig: Apply to affected area daily to 4 times a day PRN Dispense: 60gm Pump Bottle    Dispense:  1 each    Refill:  5    Do not place this medication, or any other prescription from our practice, on "Automatic Refill". Patient may have prescription filled one day early if pharmacy is closed on scheduled refill date.   New Prescriptions   No medications on file   Medications administered during this visit: Ms. Opperman  had no medications administered during this visit. Lab-work, Procedure(s), & Referral(s) Ordered: No orders of the defined types were placed in this encounter.  Imaging & Referral(s) Ordered: None  Interventional Therapies: Scheduled:  None at this time.    Considering:  None at this time.    PRN Procedures:  None at this time.    Requested PM Follow-up: Return in about 6 months (around 05/17/2016) for Med-Mgmt.  Future Appointments Date Time Provider [place]  12/07/2015 2:00 PM [facility] [place] [id number]  05/17/2016 10:20 AM Milinda Pointer, MD [facility] None    Primary Care Physician: Dion Body, MD Location: [facility] Facility Note by: Kathlen Brunswick. Dossie Arbour, M.D, DABA, DABAPM, DABPM, DABIPP, FIPP  Pain Score Disclaimer: We use the NRS-11 scale. This is a self-reported, subjective measurement of pain severity with only modest accuracy. It is used primarily to identify changes within a particular patient. It must be understood that outpatient pain scales are significantly less accurate that  those used for research, where they can be applied under ideal controlled circumstances with minimal exposure to variables. In reality, the score is likely to be a combination of pain intensity and pain affect, where pain affect describes the degree of emotional arousal or changes in action readiness caused by the sensory experience of pain. Factors such as social and work situation, setting, emotional state, anxiety levels, expectation, and prior pain experience may influence pain perception and show large inter-individual differences that may also be affected by time variables.  Patient instructions provided during this appointment: Patient Instructions  You were given prescriptions for pain cream and Neurontin today.

## 2015-11-17 NOTE — Patient Instructions (Signed)
You were given prescriptions for pain cream and Neurontin today.

## 2015-11-21 DIAGNOSIS — D509 Iron deficiency anemia, unspecified: Secondary | ICD-10-CM | POA: Insufficient documentation

## 2015-12-07 ENCOUNTER — Ambulatory Visit: Payer: Medicare Other

## 2015-12-07 DIAGNOSIS — R0989 Other specified symptoms and signs involving the circulatory and respiratory systems: Secondary | ICD-10-CM

## 2016-01-17 ENCOUNTER — Emergency Department
Admission: EM | Admit: 2016-01-17 | Discharge: 2016-01-17 | Disposition: A | Discharge status: Home / Self Care | Payer: Medicare Other | Attending: Emergency Medicine | Admitting: Emergency Medicine

## 2016-01-17 ENCOUNTER — Encounter: Payer: Self-pay | Admitting: Emergency Medicine

## 2016-01-17 DIAGNOSIS — N184 Chronic kidney disease, stage 4 (severe): Secondary | ICD-10-CM | POA: Diagnosis not present

## 2016-01-17 DIAGNOSIS — K5641 Fecal impaction: Secondary | ICD-10-CM | POA: Diagnosis not present

## 2016-01-17 DIAGNOSIS — R339 Retention of urine, unspecified: Secondary | ICD-10-CM | POA: Diagnosis not present

## 2016-01-17 DIAGNOSIS — I129 Hypertensive chronic kidney disease with stage 1 through stage 4 chronic kidney disease, or unspecified chronic kidney disease: Secondary | ICD-10-CM | POA: Insufficient documentation

## 2016-01-17 LAB — URINALYSIS COMPLETE WITH MICROSCOPIC (ARMC ONLY)
Bilirubin Urine: NEGATIVE
Glucose, UA: NEGATIVE mg/dL
HGB URINE DIPSTICK: NEGATIVE
Ketones, ur: NEGATIVE mg/dL
LEUKOCYTES UA: NEGATIVE
NITRITE: NEGATIVE
PROTEIN: 100 mg/dL — AB
SPECIFIC GRAVITY, URINE: 1.006 (ref 1.005–1.030)
Squamous Epithelial / LPF: NONE SEEN
pH: 7 (ref 5.0–8.0)

## 2016-01-17 NOTE — ED Notes (Signed)
Pt stated she had bowel movement after MD disimpaction.

## 2016-01-17 NOTE — ED Notes (Signed)
Pt bladder scanned and pt had 0 ml and given water to drink

## 2016-01-17 NOTE — ED Notes (Signed)
Pt able to void a small amount before coming back to the room

## 2016-01-17 NOTE — ED Triage Notes (Signed)
Patient presents to the ED with no urination today and constipation where patient feels like she needs to have a bowel movement but is unable and when she wipes she sees some stools on the toilet paper but is unable to produce a bowel movement.  Patient reports having issues with dehydration and renal problems.  Patient reports that she had drank over 48oz of water today.

## 2016-01-17 NOTE — ED Provider Notes (Signed)
[facility] Emergency Department Provider Note  ____________________________________________   First MD Initiated Contact with Patient 01/17/16 1900     (approximate)  I have reviewed the triage vital signs and the nursing notes.   HISTORY  Chief Complaint Urinary Retention and Constipation   HPI Joanne Michael is a 82 y.o. female who is presenting with constipation as well as urinary retention. She says that she has only been having small, pellet-like stools over the past several days and today has been having difficulty pushing out her stool and also has not urinated since waking this morning. She was able to urinate a small amount of triage but says that it burned. She says that she drinks plenty of water and also drinks prune juice and has never had an issue like this before. Also complaining of lower abdominal fullness.   Bladder was scanned in triage and revealed about 520 cc of urine.   Past Medical History:  Diagnosis Date  . Arthritis   . Cancer ([place])    melanoma  . Cat allergies   . Chronic renal failure   . Fatigue   . GERD (gastroesophageal reflux disease)   . Gout   . H/O Clostridium difficile infection   . Hay fever    as child  . Hiatal hernia   . HLD (hyperlipidemia)   . HTN (hypertension)   . Iron deficiency anemia   . Spinal stenosis     Patient Active Problem List   Diagnosis Date Noted  . Iron deficiency anemia 11/21/2015  . Gout, joint 10/10/2015  . Pure hypercholesterolemia 10/10/2015  . Chronic hip pain (Bilateral) 07/13/2015  . Posterior arthritis of hip (Bilateral) 07/13/2015  . Chronic sacroiliac joint pain (Bilateral) 07/13/2015  . Osteoarthritis of sacroiliac joint (Bilateral) 07/13/2015  . Lumbar spinal stenosis 07/13/2015  . Acute renal failure (ARF) ([place]) 05/22/2015  . Kidney failure 04/26/2015  . Spinal stenosis 04/18/2015  . Osteoporosis, post-menopausal 04/18/2015  . Arthritis, degenerative  04/18/2015  . 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 Combined fat and carbohydrate induced hyperlipemia 04/18/2015  . Anemia, iron deficiency 04/18/2015  . Arthritis urica 04/18/2015  . Gastro-esophageal reflux disease without esophagitis 04/18/2015  . Essential (primary) hypertension 04/18/2015  . CKD (chronic kidney disease) stage 4, GFR 15-29 ml/min (HCC) 04/18/2015  . Chronic low back pain (Location of Primary Source of Pain) (Bilateral) (R>L) 04/18/2015  . Lumbar facet syndrome (Location of Primary Source of Pain) (Bilateral) (R>L) 04/18/2015  . Lumbar spondylosis 04/18/2015  . Chronic pain 04/18/2015  . Long term current use of opiate analgesic 04/18/2015  . Long term prescription opiate use 04/18/2015  . Opiate use 04/18/2015  . Encounter for therapeutic drug level monitoring 04/18/2015  . Encounter for chronic pain management 04/18/2015  . Chronic lower extremity pain (referred) (Location of Secondary source of pain) (Bilateral) (R>L) 04/18/2015  . Neurogenic pain 04/18/2015  . Heart murmur, systolic 12/87/8676  . Pulmonary parenchymal mass 12/02/2013  . Lung mass 12/02/2013  . SOB (shortness of breath) 11/03/2013  . Cough 11/03/2013  . Bilateral leg edema 07/22/2013  . Chronic renal insufficiency 07/22/2013  . Aortic valve stenosis 07/22/2013  . MURMUR 05/26/2009  . Hyperlipidemia 05/02/2009  . ESSENTIAL HYPERTENSION, BENIGN 05/02/2009  . SHORTNESS OF BREATH 05/02/2009  . CHEST PAIN UNSPECIFIED 05/02/2009    Past Surgical History:  Procedure Laterality Date  . APPENDECTOMY    . BACK SURGERY    . CATARACT EXTRACTION    .  CHOLECYSTECTOMY    . feet surgery    . HERNIA REPAIR    . knee replacement- both    . MELANOMA EXCISION    . NOSE SURGERY    . ROTATOR CUFF REPAIR     left  . SHOULDER SURGERY    . TONSILLECTOMY    . TOTAL ABDOMINAL HYSTERECTOMY      Prior to Admission medications   Medication Sig Start Date End Date Taking? Authorizing  Provider  albuterol (PROVENTIL HFA;VENTOLIN HFA) 108 (90 BASE) MCG/ACT inhaler Inhale 1-2 puffs into the lungs every 6 (six) hours as needed for wheezing or shortness of breath. 02/14/15   Minna Merritts, MD  allopurinol (ZYLOPRIM) 100 MG tablet Take 100 mg by mouth 2 (two) times daily.    Historical Provider, MD  ascorbic acid (VITAMIN C) 1000 MG tablet Take 1,000 mg by mouth daily.    Historical Provider, MD  aspirin EC 81 MG tablet Take 81 mg by mouth daily.    Historical Provider, MD  atorvastatin (LIPITOR) 20 MG tablet Take 20 mg by mouth at bedtime.     Historical Provider, MD  benzonatate (TESSALON) 200 MG capsule Take by mouth. 02/23/15   Historical Provider, MD  Calcium Carbonate-Vitamin D (CALTRATE 600+D) 600-400 MG-UNIT per tablet Take 1 tablet by mouth daily.      Historical Provider, MD  cetirizine (ZYRTEC) 10 MG chewable tablet Chew 10 mg by mouth daily.      Historical Provider, MD  cloNIDine (CATAPRES) 0.1 MG tablet Take 0.1 mg by mouth 2 (two) times daily.     Historical Provider, MD  doxazosin (CARDURA) 8 MG tablet Take 1 tablet (8 mg total) by mouth daily. 08/15/15   Minna Merritts, MD  ferrous sulfate 325 (65 FE) MG tablet Take 325 mg by mouth daily with breakfast.    Historical Provider, MD  fluticasone (FLONASE) 50 MCG/ACT nasal spray Place 2 sprays into both nostrils at bedtime as needed for allergies or rhinitis. 05/25/15   Srikar Sudini, MD  gabapentin (NEURONTIN) 300 MG capsule Take 1 capsule (300 mg total) by mouth at bedtime. 11/17/15 12/17/15  Milinda Pointer, MD  meloxicam (MOBIC) 15 MG tablet Take by mouth. 08/31/15 08/30/16  Historical Provider, MD  Multiple Vitamin (MULTIVITAMIN) tablet Take 1 tablet by mouth daily.    Historical Provider, MD  NONFORMULARY OR COMPOUNDED ITEM 10% Ketoprofen + 4% lidocaine in PLO gel. Sig: Apply to affected area daily to 4 times a day PRN Dispense: 60gm Pump Bottle 11/17/15   Milinda Pointer, MD  pantoprazole (PROTONIX) 40 MG tablet  Take 1 tablet (40 mg total) by mouth daily. 02/14/15   Minna Merritts, MD  Probiotic Product ([place]) Take 1 capsule by mouth daily.    Historical Provider, MD  traMADol (ULTRAM) 50 MG tablet Take 1 tablet (50 mg total) by mouth every 6 (six) hours as needed for severe pain. 10/10/15   Milinda Pointer, MD    Allergies Oxycodone; Codeine; Hydrocodone-acetaminophen; Iodine; Meperidine hcl; Other; and Propoxyphene n-acetaminophen  Family History  Problem Relation Age of Onset  . Ovarian cancer Mother   . Liver cancer Father   . Heart failure Father   . Lung cancer Brother   . Prostate cancer Brother   . Lung cancer Sister     Social History Social History  Substance Use Topics  . Smoking status: Never Smoker  . Smokeless tobacco: Never Used     Comment: Tobacco use- no   .  Alcohol use No    Review of Systems Constitutional: No fever/chills Eyes: No visual changes. ENT: No sore throat. Cardiovascular: Denies chest pain. Respiratory: Denies shortness of breath. Gastrointestinal:   No nausea, no vomiting.  No diarrhea.   Genitourinary: Negative for dysuria. Musculoskeletal: Negative for back pain. Skin: Negative for rash. Neurological: Negative for headaches, focal weakness or numbness.  10-point ROS otherwise negative.  ____________________________________________   PHYSICAL EXAM:  VITAL SIGNS: ED Triage Vitals  Enc Vitals Group     BP 01/17/16 1847 (!) 156/90     Pulse Rate 01/17/16 1847 73     Resp 01/17/16 1847 18     Temp 01/17/16 1847 98.3 F (36.8 C)     Temp Source 01/17/16 1847 Oral     SpO2 01/17/16 1847 99 %     Weight 01/17/16 1848 120 lb (54.4 kg)     Height 01/17/16 1848 4\' 11"  (1.499 m)     Head Circumference --      Peak Flow --      Pain Score 01/17/16 1848 10     Pain Loc --      Pain Edu? --      Excl. in [place]? --     Constitutional: Alert and oriented. Well appearing and in no acute distress. Eyes: Conjunctivae are  normal. PERRL. EOMI. Head: Atraumatic. Nose: No congestion/rhinnorhea. Mouth/Throat: Mucous membranes are moist.   Neck: No stridor.   Cardiovascular: Normal rate, regular rhythm. Grossly normal heart sounds.   Respiratory: Normal respiratory effort.  No retractions. Lungs CTAB. Gastrointestinal: Soft and nontender. No distention.  Musculoskeletal: No lower extremity tenderness nor edema.  No joint effusions. Neurologic:  Normal speech and language. No gross focal neurologic deficits are appreciated. No gait instability. Skin:  Skin is warm, dry and intact. No rash noted. Psychiatric: Mood and affect are normal. Speech and behavior are normal.  ____________________________________________   LABS (all labs ordered are listed, but only abnormal results are displayed)  Labs Reviewed  URINALYSIS COMPLETEWITH MICROSCOPIC ([place]) - Abnormal; Notable for the following:       Result Value   Color, Urine STRAW (*)    APPearance CLEAR (*)    Protein, ur 100 (*)    Bacteria, UA RARE (*)    All other components within normal limits   ____________________________________________  EKG   ____________________________________________  RADIOLOGY   ____________________________________________   PROCEDURES  Procedure(s) performed:  ------------------------------------------------------------------------------------------------------------------- Fecal Disimpaction Procedure Note:  Performed by me:  Patient placed in the lateral recumbent position with knees drawn towards chest. Nurse present for patient support. Large amount of hard brown stool removed. No complications during procedure.  Patient had a large bowel movement after the disimpaction also with a postvoid residual of 0.   ------------------------------------------------------------------------------------------------------------------   Procedures  Critical Care performed:    ____________________________________________   INITIAL IMPRESSION / ASSESSMENT AND PLAN / ED COURSE  Pertinent labs & imaging results that were available during my care of the patient were reviewed by me and considered in my medical decision making (see chart for details).   Clinical Course   ----------------------------------------- 9:18 PM on 01/17/2016 -----------------------------------------  Patient without findings of UTI on her urinalysis.  It is likely related to constipation. She will continue with her prune juice as well as water. We also discussed the use of over-the-counter stool softeners. She will be following up with her primary care doctor. Will be discharged home. Urinary retention likely related to fecal impaction.   ____________________________________________  FINAL CLINICAL IMPRESSION(S) / ED DIAGNOSES  Fecal impaction. Urinary retention.    NEW MEDICATIONS STARTED DURING THIS VISIT:  New Prescriptions   No medications on file     Note:  This document was prepared using Dragon voice recognition software and may include unintentional dictation errors.    Orbie Pyo, MD 01/17/16 2119

## 2016-01-17 NOTE — ED Notes (Signed)
Bladder Scanned patient: 570ml in bladder.  Dr. Clearnce Hasten notified.  MD recommended patient be disimpacted prior to having a foley placed.

## 2016-02-16 ENCOUNTER — Other Ambulatory Visit: Payer: Self-pay | Admitting: *Deleted

## 2016-02-16 MED ORDER — DOXAZOSIN MESYLATE 8 MG PO TABS: 8.0000 mg | ORAL_TABLET | Freq: Every day | ORAL | 3 refills | Status: DC

## 2016-04-16 ENCOUNTER — Emergency Department: Payer: Medicare Other

## 2016-04-16 ENCOUNTER — Emergency Department
Admission: EM | Admit: 2016-04-16 | Discharge: 2016-04-16 | Disposition: A | Discharge status: Home / Self Care | Payer: Medicare Other | Attending: Emergency Medicine | Admitting: Emergency Medicine

## 2016-04-16 ENCOUNTER — Encounter: Payer: Self-pay | Admitting: Intensive Care

## 2016-04-16 DIAGNOSIS — Y999 Unspecified external cause status: Secondary | ICD-10-CM | POA: Insufficient documentation

## 2016-04-16 DIAGNOSIS — S0083XA Contusion of other part of head, initial encounter: Secondary | ICD-10-CM | POA: Insufficient documentation

## 2016-04-16 DIAGNOSIS — Z85820 Personal history of malignant melanoma of skin: Secondary | ICD-10-CM | POA: Insufficient documentation

## 2016-04-16 DIAGNOSIS — Y9289 Other specified places as the place of occurrence of the external cause: Secondary | ICD-10-CM | POA: Insufficient documentation

## 2016-04-16 DIAGNOSIS — S0990XA Unspecified injury of head, initial encounter: Secondary | ICD-10-CM

## 2016-04-16 DIAGNOSIS — Z7982 Long term (current) use of aspirin: Secondary | ICD-10-CM | POA: Diagnosis not present

## 2016-04-16 DIAGNOSIS — N184 Chronic kidney disease, stage 4 (severe): Secondary | ICD-10-CM | POA: Diagnosis not present

## 2016-04-16 DIAGNOSIS — I129 Hypertensive chronic kidney disease with stage 1 through stage 4 chronic kidney disease, or unspecified chronic kidney disease: Secondary | ICD-10-CM | POA: Diagnosis not present

## 2016-04-16 DIAGNOSIS — W010XXA Fall on same level from slipping, tripping and stumbling without subsequent striking against object, initial encounter: Secondary | ICD-10-CM | POA: Insufficient documentation

## 2016-04-16 DIAGNOSIS — Y9301 Activity, walking, marching and hiking: Secondary | ICD-10-CM | POA: Insufficient documentation

## 2016-04-16 DIAGNOSIS — Z79899 Other long term (current) drug therapy: Secondary | ICD-10-CM | POA: Diagnosis not present

## 2016-04-16 NOTE — ED Notes (Signed)
Pt changed into gown and taken to CT and xray. Family at bedside.

## 2016-04-16 NOTE — ED Provider Notes (Signed)
[facility] Emergency Department Provider Note   ____________________________________________    I have reviewed the triage vital signs and the nursing notes.   HISTORY  Chief Complaint Fall     HPI Joanne Michael is a 83 y.o. female who presents after a fall. Patient reports she tripped on the way into [facility] and fell forward onto her face. She complains of mild HA. No blood thinners. She also injured her left hand. No neck pain. No abdominal pain, no back pain.    Past Medical History:  Diagnosis Date  . Arthritis   . Cancer ([place])    melanoma  . Cat allergies   . Chronic renal failure   . Fatigue   . GERD (gastroesophageal reflux disease)   . Gout   . H/O Clostridium difficile infection   . Hay fever    as child  . Hiatal hernia   . HLD (hyperlipidemia)   . HTN (hypertension)   . Iron deficiency anemia   . Spinal stenosis     Patient Active Problem List   Diagnosis Date Noted  . Iron deficiency anemia 11/21/2015  . Gout, joint 10/10/2015  . Pure hypercholesterolemia 10/10/2015  . Chronic hip pain (Bilateral) 07/13/2015  . Posterior arthritis of hip (Bilateral) 07/13/2015  . Chronic sacroiliac joint pain (Bilateral) 07/13/2015  . Osteoarthritis of sacroiliac joint (Bilateral) 07/13/2015  . Lumbar spinal stenosis 07/13/2015  . Acute renal failure (ARF) ([place]) 05/22/2015  . Kidney failure 04/26/2015  . Spinal stenosis 04/18/2015  . Osteoporosis, post-menopausal 04/18/2015  . Arthritis, degenerative 04/18/2015  . 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 Combined fat and carbohydrate induced hyperlipemia 04/18/2015  . Anemia, iron deficiency 04/18/2015  . Arthritis urica 04/18/2015  . Gastro-esophageal reflux disease without esophagitis 04/18/2015  . Essential (primary) hypertension 04/18/2015  . CKD (chronic kidney disease) stage 4, GFR 15-29 ml/min (HCC) 04/18/2015  . Chronic low back pain (Location of  Primary Source of Pain) (Bilateral) (R>L) 04/18/2015  . Lumbar facet syndrome (Location of Primary Source of Pain) (Bilateral) (R>L) 04/18/2015  . Lumbar spondylosis 04/18/2015  . Chronic pain 04/18/2015  . Long term current use of opiate analgesic 04/18/2015  . Long term prescription opiate use 04/18/2015  . Opiate use 04/18/2015  . Encounter for therapeutic drug level monitoring 04/18/2015  . Encounter for chronic pain management 04/18/2015  . Chronic lower extremity pain (referred) (Location of Secondary source of pain) (Bilateral) (R>L) 04/18/2015  . Neurogenic pain 04/18/2015  . Heart murmur, systolic 44/02/270  . Pulmonary parenchymal mass 12/02/2013  . Lung mass 12/02/2013  . SOB (shortness of breath) 11/03/2013  . Cough 11/03/2013  . Bilateral leg edema 07/22/2013  . Chronic renal insufficiency 07/22/2013  . Aortic valve stenosis 07/22/2013  . MURMUR 05/26/2009  . Hyperlipidemia 05/02/2009  . ESSENTIAL HYPERTENSION, BENIGN 05/02/2009  . SHORTNESS OF BREATH 05/02/2009  . CHEST PAIN UNSPECIFIED 05/02/2009    Past Surgical History:  Procedure Laterality Date  . APPENDECTOMY    . BACK SURGERY    . CATARACT EXTRACTION    . CHOLECYSTECTOMY    . feet surgery    . HERNIA REPAIR    . knee replacement- both    . MELANOMA EXCISION    . NOSE SURGERY    . ROTATOR CUFF REPAIR     left  . SHOULDER SURGERY    . TONSILLECTOMY    . TOTAL ABDOMINAL HYSTERECTOMY      Prior to Admission medications  Medication Sig Start Date End Date Taking? Authorizing Provider  allopurinol (ZYLOPRIM) 100 MG tablet Take 100 mg by mouth 2 (two) times daily.   Yes Historical Provider, MD  ascorbic acid (VITAMIN C) 1000 MG tablet Take 1,000 mg by mouth daily.   Yes Historical Provider, MD  aspirin EC 81 MG tablet Take 81 mg by mouth daily.   Yes Historical Provider, MD  atorvastatin (LIPITOR) 20 MG tablet Take 20 mg by mouth at bedtime.    Yes Historical Provider, MD  Calcium Carbonate-Vitamin D  (CALTRATE 600+D) 600-400 MG-UNIT per tablet Take 1 tablet by mouth daily.     Yes Historical Provider, MD  carvedilol (COREG) 3.125 MG tablet Take 3.125 mg by mouth 2 (two) times daily. 04/06/16 04/06/17 Yes Historical Provider, MD  cetirizine (ZYRTEC) 10 MG chewable tablet Chew 10 mg by mouth daily.     Yes Historical Provider, MD  cloNIDine (CATAPRES) 0.1 MG tablet Take 0.1 mg by mouth 2 (two) times daily.    Yes Historical Provider, MD  doxazosin (CARDURA) 8 MG tablet Take 1 tablet (8 mg total) by mouth daily. 02/16/16  Yes Minna Merritts, MD  ferrous sulfate 325 (65 FE) MG tablet Take 325 mg by mouth daily with breakfast.   Yes Historical Provider, MD  furosemide (LASIX) 20 MG tablet Take 20 mg by mouth every other day. Takes 1 tab on Monday, Wed, and Friday   Yes Historical Provider, MD  gabapentin (NEURONTIN) 300 MG capsule Take 1 capsule (300 mg total) by mouth at bedtime. 11/17/15 04/16/16 Yes Milinda Pointer, MD  Multiple Vitamin (MULTIVITAMIN) tablet Take 1 tablet by mouth daily.   Yes Historical Provider, MD  pantoprazole (PROTONIX) 40 MG tablet Take 40 mg by mouth at bedtime.  02/14/15  Yes Minna Merritts, MD  Probiotic Product ([place] [facility]) Take 1 capsule by mouth daily.   Yes Historical Provider, MD  traMADol (ULTRAM) 50 MG tablet Take 1 tablet (50 mg total) by mouth every 6 (six) hours as needed for severe pain. Patient taking differently: Take 50 mg by mouth daily.  10/10/15  Yes Milinda Pointer, MD  albuterol (PROVENTIL HFA;VENTOLIN HFA) 108 (90 BASE) MCG/ACT inhaler Inhale 1-2 puffs into the lungs every 6 (six) hours as needed for wheezing or shortness of breath. Patient not taking: Reported on 04/16/2016 02/14/15   Minna Merritts, MD  fluticasone [facility]) 50 MCG/ACT nasal spray Place 2 sprays into both nostrils at bedtime as needed for allergies or rhinitis. 05/25/15   Hillary Bow, MD  NONFORMULARY OR COMPOUNDED ITEM 10% Ketoprofen + 4% lidocaine in PLO gel. Sig:  Apply to affected area daily to 4 times a day PRN Dispense: 60gm Pump Bottle 11/17/15   Milinda Pointer, MD     Allergies Oxycodone; Codeine; Hydrocodone-acetaminophen; Iodine; Meperidine hcl; Other; and Propoxyphene n-acetaminophen  Family History  Problem Relation Age of Onset  . Ovarian cancer Mother   . Liver cancer Father   . Heart failure Father   . Lung cancer Brother   . Prostate cancer Brother   . Lung cancer Sister     Social History Social History  Substance Use Topics  . Smoking status: Never Smoker  . Smokeless tobacco: Never Used     Comment: Tobacco use- no   . Alcohol use No    Review of Systems  Constitutional: No Dizziness Eyes: No visual changes.  ENT: No neck pain Cardiovascular: Denies chest pain. Respiratory: Denies shortness of breath. Gastrointestinal: No abdominal pain.  No nausea, no vomiting.    Musculoskeletal: Negative for back pain. Skin: Negative for laceration Neurological: Negative for focal weakness  10-point ROS otherwise negative.  ____________________________________________   PHYSICAL EXAM:  VITAL SIGNS: ED Triage Vitals  Enc Vitals Group     BP 04/16/16 1331 (!) 199/72     Pulse Rate 04/16/16 1331 60     Resp 04/16/16 1331 18     Temp 04/16/16 1331 97.8 F (36.6 C)     Temp Source 04/16/16 1331 Oral     SpO2 04/16/16 1331 97 %     Weight 04/16/16 1332 118 lb 11.2 oz (53.8 kg)     Height 04/16/16 1332 5\' 4"  (1.626 m)     Head Circumference --      Peak Flow --      Pain Score 04/16/16 1333 8     Pain Loc --      Pain Edu? --      Excl. in [place]? --     Constitutional: Alert and oriented. No acute distress. Pleasant and interactive Eyes: Conjunctivae are normal.  Head: Hematoma superior laterally left orbit, no bony other modalities Nose: No congestion/rhinnorhea. Mouth/Throat: Mucous membranes are moist.   Neck:  Painless ROM, no vertebral tenderness to palpation Cardiovascular: Normal rate, regular rhythm.  Grossly normal heart sounds.  Good peripheral circulation. Respiratory: Normal respiratory effort.  No retractions. Lungs CTAB. Gastrointestinal: Soft and nontender. No distention.  No CVA tenderness. Genitourinary: deferred Musculoskeletal: Full range of motion of all extremities. Swelling along the dorsum of the left third distal metatarsal.  Warm and well perfused. Normal range of motion of left ankle no pain with axial load. Patient is able to bear weight on her left foot without pain Neurologic:  Normal speech and language. No gross focal neurologic deficits are appreciated. Ambulates well Skin:  Skin is warm, dry and intact. No rash noted. Psychiatric: Mood and affect are normal. Speech and behavior are normal.  ____________________________________________   LABS (all labs ordered are listed, but only abnormal results are displayed)  Labs Reviewed - No data to display ____________________________________________  EKG  None ____________________________________________  RADIOLOGY  CT head and cervical spine unremarkable Hand x-ray no fracture ____________________________________________   PROCEDURES  Procedure(s) performed: No    Critical Care performed: No ____________________________________________   INITIAL IMPRESSION / ASSESSMENT AND PLAN / ED COURSE  Pertinent labs & imaging results that were available during my care of the patient were reviewed by me and considered in my medical decision making (see chart for details).  Patient well-appearing and in no acute distress. Imaging scans are reassuring. She is able to ambulate very well without difficulty. Feel she is appropriate for discharge at this time. Family agrees with plan    ____________________________________________   FINAL CLINICAL IMPRESSION(S) / ED DIAGNOSES  Final diagnoses:  Contusion of face, initial encounter  Injury of head, initial encounter      NEW MEDICATIONS STARTED DURING THIS  VISIT:  New Prescriptions   No medications on file     Note:  This document was prepared using Dragon voice recognition software and may include unintentional dictation errors.    Lavonia Drafts, MD 04/16/16 [phone]

## 2016-04-16 NOTE — ED Triage Notes (Signed)
Patient arrived by EMS from [facility]. Pt was walking in and tripped over the doorway. Pt has hematoma noted to L forehead and L hand. L ankle swelling is also present. A&O x3. Pt ambulated with EMS with no difficulties. Pt c/o nausea and dizziness. Takes aspirin daily

## 2016-04-16 NOTE — ED Notes (Signed)
Pt returns to room from xray, family at bedside.

## 2016-05-03 ENCOUNTER — Emergency Department: Payer: Medicare Other

## 2016-05-03 ENCOUNTER — Emergency Department
Admission: EM | Admit: 2016-05-03 | Discharge: 2016-05-03 | Disposition: A | Discharge status: Home / Self Care | Payer: Medicare Other | Attending: Emergency Medicine | Admitting: Emergency Medicine

## 2016-05-03 DIAGNOSIS — Z7982 Long term (current) use of aspirin: Secondary | ICD-10-CM | POA: Insufficient documentation

## 2016-05-03 DIAGNOSIS — K5641 Fecal impaction: Secondary | ICD-10-CM

## 2016-05-03 DIAGNOSIS — Z79899 Other long term (current) drug therapy: Secondary | ICD-10-CM | POA: Insufficient documentation

## 2016-05-03 DIAGNOSIS — Z85820 Personal history of malignant melanoma of skin: Secondary | ICD-10-CM | POA: Insufficient documentation

## 2016-05-03 DIAGNOSIS — N184 Chronic kidney disease, stage 4 (severe): Secondary | ICD-10-CM | POA: Insufficient documentation

## 2016-05-03 DIAGNOSIS — I129 Hypertensive chronic kidney disease with stage 1 through stage 4 chronic kidney disease, or unspecified chronic kidney disease: Secondary | ICD-10-CM | POA: Insufficient documentation

## 2016-05-03 DIAGNOSIS — R300 Dysuria: Secondary | ICD-10-CM | POA: Insufficient documentation

## 2016-05-03 LAB — URINALYSIS, COMPLETE (UACMP) WITH MICROSCOPIC
BACTERIA UA: NONE SEEN
BILIRUBIN URINE: NEGATIVE
GLUCOSE, UA: NEGATIVE mg/dL
Hgb urine dipstick: NEGATIVE
KETONES UR: NEGATIVE mg/dL
LEUKOCYTES UA: NEGATIVE
NITRITE: NEGATIVE
PH: 7 (ref 5.0–8.0)
Protein, ur: 100 mg/dL — AB
Specific Gravity, Urine: 1.003 — ABNORMAL LOW (ref 1.005–1.030)
Squamous Epithelial / LPF: NONE SEEN

## 2016-05-03 MED ORDER — DOCUSATE SODIUM 100 MG PO CAPS: 100.0000 mg | ORAL_CAPSULE | Freq: Every day | ORAL | 0 refills | Status: DC | PRN

## 2016-05-03 MED ORDER — LIDOCAINE HCL 2 % EX GEL
1.0000 "application " | Freq: Once | CUTANEOUS | Status: AC
  Administered 2016-05-03: 1
  Filled 2016-05-03: qty 5

## 2016-05-03 NOTE — ED Notes (Signed)
Disimpaction done by Dr. Mable Paris was very successful.   Pt cleaned with warm wipes then ambulatory to toilet with this RN to urinate.

## 2016-05-03 NOTE — Discharge Instructions (Signed)
Please make sure you remain well-hydrated and take fiber in her diet at least twice a day. I am also prescribing you a stool softener I can help prevent this constipation from ever happening. Fortunately today you do not have an infection in her urine. Follow-up with your primary care physician on Monday for recheck. Return to the emergency department sooner for any new or worsening symptoms such as chest pain, shortness of breath, abdominal pain, or for any other concerns.  Results for orders placed or performed during the hospital encounter of 05/03/16  Urinalysis, Complete w Microscopic  Result Value Ref Range   Color, Urine YELLOW (A) YELLOW   APPearance CLEAR (A) CLEAR   Specific Gravity, Urine 1.003 (L) 1.005 - 1.030   pH 7.0 5.0 - 8.0   Glucose, UA NEGATIVE NEGATIVE mg/dL   Hgb urine dipstick NEGATIVE NEGATIVE   Bilirubin Urine NEGATIVE NEGATIVE   Ketones, ur NEGATIVE NEGATIVE mg/dL   Protein, ur 100 (A) NEGATIVE mg/dL   Nitrite NEGATIVE NEGATIVE   Leukocytes, UA NEGATIVE NEGATIVE   RBC / HPF 0-5 0 - 5 RBC/hpf   WBC, UA 0-5 0 - 5 WBC/hpf   Bacteria, UA NONE SEEN NONE SEEN   Squamous Epithelial / LPF NONE SEEN NONE SEEN   Ct Head Wo Contrast  Result Date: 04/16/2016 CLINICAL DATA:  83 year old female tripped over dural way. Nausea and dizziness. Initial encounter. EXAM: CT HEAD WITHOUT CONTRAST CT CERVICAL SPINE WITHOUT CONTRAST TECHNIQUE: Multidetector CT imaging of the head and cervical spine was performed following the standard protocol without intravenous contrast. Multiplanar CT image reconstructions of the cervical spine were also generated. COMPARISON:  10/13/2015. FINDINGS: CT HEAD FINDINGS Brain: No intracranial hemorrhage or CT evidence of large acute infarct. Chronic microvascular changes. Global atrophy without hydrocephalus. No intracranial mass lesion noted on this unenhanced exam. Vascular: Vascular calcifications. Skull: No skull fracture. Sinuses/Orbits: Post lens  replacement without acute orbital abnormality. Sinuses which are visualized are clear. Other: Negative CT CERVICAL SPINE FINDINGS Alignment: Mild curvature. Skull base and vertebrae: No cervical spine fracture. Soft tissues and spinal canal: No abnormal prevertebral soft tissue swelling. Disc levels: Cervical spondylotic changes with spinal stenosis and foraminal narrowing most prominent C3-4 thru C6-7. Upper chest: Scarring lung apex. Other: Carotid bifurcation calcifications. IMPRESSION: CT HEAD No skull fracture or intracranial hemorrhage. Chronic microvascular changes. Global atrophy without hydrocephalus. CT CERVICAL SPINE No cervical spine fracture. Cervical spondylotic changes with spinal stenosis and foraminal narrowing most prominent C3-4 thru C6-7. Electronically Signed   By: Genia Del M.D.   On: 04/16/2016 14:47   Ct Cervical Spine Wo Contrast  Result Date: 04/16/2016 CLINICAL DATA:  83 year old female tripped over dural way. Nausea and dizziness. Initial encounter. EXAM: CT HEAD WITHOUT CONTRAST CT CERVICAL SPINE WITHOUT CONTRAST TECHNIQUE: Multidetector CT imaging of the head and cervical spine was performed following the standard protocol without intravenous contrast. Multiplanar CT image reconstructions of the cervical spine were also generated. COMPARISON:  10/13/2015. FINDINGS: CT HEAD FINDINGS Brain: No intracranial hemorrhage or CT evidence of large acute infarct. Chronic microvascular changes. Global atrophy without hydrocephalus. No intracranial mass lesion noted on this unenhanced exam. Vascular: Vascular calcifications. Skull: No skull fracture. Sinuses/Orbits: Post lens replacement without acute orbital abnormality. Sinuses which are visualized are clear. Other: Negative CT CERVICAL SPINE FINDINGS Alignment: Mild curvature. Skull base and vertebrae: No cervical spine fracture. Soft tissues and spinal canal: No abnormal prevertebral soft tissue swelling. Disc levels: Cervical  spondylotic changes with spinal stenosis and foraminal  narrowing most prominent C3-4 thru C6-7. Upper chest: Scarring lung apex. Other: Carotid bifurcation calcifications. IMPRESSION: CT HEAD No skull fracture or intracranial hemorrhage. Chronic microvascular changes. Global atrophy without hydrocephalus. CT CERVICAL SPINE No cervical spine fracture. Cervical spondylotic changes with spinal stenosis and foraminal narrowing most prominent C3-4 thru C6-7. Electronically Signed   By: Genia Del M.D.   On: 04/16/2016 14:47   Dg Abd Acute W/chest  Result Date: 05/03/2016 CLINICAL DATA:  83 y/o  F; constipation and abdominal pain. EXAM: DG ABDOMEN ACUTE W/ 1V CHEST COMPARISON:  10/14/2015 chest radiograph FINDINGS: Clear lungs. Aortic atherosclerosis with calcification. Stable normal cardiac silhouette given projection and technique. Multilevel degenerative changes of the spine with reverse S thoracolumbar curvature. Normal bowel gas pattern. Large volume of stool throughout the colon. Right pelvic phleboliths. Right upper quadrant surgical clips, presumably cholecystectomy. Stable mild loss of height of the L2 and L3 vertebral bodies. IMPRESSION: Normal bowel gas pattern. Large volume of stool in the colon. No acute cardiopulmonary process identified. Electronically Signed   By: Kristine Garbe M.D.   On: 05/03/2016 19:15   Dg Hand Complete Left  Result Date: 04/16/2016 CLINICAL DATA:  Fall, tripped over doorway, left hand third metacarpophalangeal joint EXAM: LEFT HAND - COMPLETE 3+ VIEW COMPARISON:  None. FINDINGS: Three views of the left hand submitted. No acute fracture or subluxation. Narrowing of radiocarpal joint space. Degenerative changes are noticed first carpometacarpal joint. Degenerative changes are noted interphalangeal joint left thumb. Mild degenerative changes proximal interphalangeal joint third fourth and fifth finger. There is soft tissue swelling at the base of third finger.  Degenerative changes distal interphalangeal joints second third and fourth finger. IMPRESSION: No acute fracture or subluxation. Multilevel degenerative changes as described above. Soft tissue swelling proximal aspect third finger. Electronically Signed   By: Lahoma Crocker M.D.   On: 04/16/2016 14:38

## 2016-05-03 NOTE — ED Triage Notes (Addendum)
Pt constipated for about one week. Increased pain since yesterday and had urinary incontinence. Hx of stool impaction. Took multiple meds to help her have bm. Now has generalized abd pain

## 2016-05-03 NOTE — ED Notes (Signed)
Pt states constipation x 3 days, started lactulose last night, had small BM today. States kidney disease. Lasix MWF. Ankles swollen bilat. Pt states burning with urination. Pt c/o low back pain. Pt alert, oriented, brought back in wheelchair. Hx of fecal impaction. Family with pt.

## 2016-05-03 NOTE — ED Notes (Signed)
Hat placed in toilet for when pt needs to urinate, but pt in xray at this time.

## 2016-05-03 NOTE — ED Provider Notes (Signed)
[facility] Emergency Department Provider Note  ____________________________________________   First MD Initiated Contact with Patient 05/03/16 1827     (approximate)  I have reviewed the triage vital signs and the nursing notes.   HISTORY  Chief Complaint Constipation and Urinary Frequency    HPI MYLI PAE is a 83 y.o. female comes to the emergency department with constipation for several days as well as dysuria. She last passed flatus this morning and last had a hard stool early this morning as well. She has no abdominal pain. No chest pain or shortness of breath. She said that she has required disimpaction in the past and has had multiple UTIs in the past.   Past Medical History:  Diagnosis Date  . Arthritis   . Cancer ([place])    melanoma  . Cat allergies   . Chronic renal failure   . Fatigue   . GERD (gastroesophageal reflux disease)   . Gout   . H/O Clostridium difficile infection   . Hay fever    as child  . Hiatal hernia   . HLD (hyperlipidemia)   . HTN (hypertension)   . Iron deficiency anemia   . Spinal stenosis     Patient Active Problem List   Diagnosis Date Noted  . Iron deficiency anemia 11/21/2015  . Gout, joint 10/10/2015  . Pure hypercholesterolemia 10/10/2015  . Chronic hip pain (Bilateral) 07/13/2015  . Posterior arthritis of hip (Bilateral) 07/13/2015  . Chronic sacroiliac joint pain (Bilateral) 07/13/2015  . Osteoarthritis of sacroiliac joint (Bilateral) 07/13/2015  . Lumbar spinal stenosis 07/13/2015  . Acute renal failure (ARF) ([place]) 05/22/2015  . Kidney failure 04/26/2015  . Spinal stenosis 04/18/2015  . Osteoporosis, post-menopausal 04/18/2015  . Arthritis, degenerative 04/18/2015  . 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 Combined fat and carbohydrate induced hyperlipemia 04/18/2015  . Anemia, iron deficiency 04/18/2015  . Arthritis urica 04/18/2015  . Gastro-esophageal reflux  disease without esophagitis 04/18/2015  . Essential (primary) hypertension 04/18/2015  . CKD (chronic kidney disease) stage 4, GFR 15-29 ml/min (HCC) 04/18/2015  . Chronic low back pain (Location of Primary Source of Pain) (Bilateral) (R>L) 04/18/2015  . Lumbar facet syndrome (Location of Primary Source of Pain) (Bilateral) (R>L) 04/18/2015  . Lumbar spondylosis 04/18/2015  . Chronic pain 04/18/2015  . Long term current use of opiate analgesic 04/18/2015  . Long term prescription opiate use 04/18/2015  . Opiate use 04/18/2015  . Encounter for therapeutic drug level monitoring 04/18/2015  . Encounter for chronic pain management 04/18/2015  . Chronic lower extremity pain (referred) (Location of Secondary source of pain) (Bilateral) (R>L) 04/18/2015  . Neurogenic pain 04/18/2015  . Heart murmur, systolic 09/32/3557  . Pulmonary parenchymal mass 12/02/2013  . Lung mass 12/02/2013  . SOB (shortness of breath) 11/03/2013  . Cough 11/03/2013  . Bilateral leg edema 07/22/2013  . Chronic renal insufficiency 07/22/2013  . Aortic valve stenosis 07/22/2013  . MURMUR 05/26/2009  . Hyperlipidemia 05/02/2009  . ESSENTIAL HYPERTENSION, BENIGN 05/02/2009  . SHORTNESS OF BREATH 05/02/2009  . CHEST PAIN UNSPECIFIED 05/02/2009    Past Surgical History:  Procedure Laterality Date  . APPENDECTOMY    . BACK SURGERY    . CATARACT EXTRACTION    . CHOLECYSTECTOMY    . feet surgery    . HERNIA REPAIR    . knee replacement- both    . MELANOMA EXCISION    . NOSE SURGERY    . ROTATOR CUFF REPAIR  left  . SHOULDER SURGERY    . TONSILLECTOMY    . TOTAL ABDOMINAL HYSTERECTOMY      Prior to Admission medications   Medication Sig Start Date End Date Taking? Authorizing Provider  albuterol (PROVENTIL HFA;VENTOLIN HFA) 108 (90 BASE) MCG/ACT inhaler Inhale 1-2 puffs into the lungs every 6 (six) hours as needed for wheezing or shortness of breath. Patient not taking: Reported on 04/16/2016 02/14/15    Minna Merritts, MD  allopurinol (ZYLOPRIM) 100 MG tablet Take 100 mg by mouth 2 (two) times daily.    Historical Provider, MD  ascorbic acid (VITAMIN C) 1000 MG tablet Take 1,000 mg by mouth daily.    Historical Provider, MD  aspirin EC 81 MG tablet Take 81 mg by mouth daily.    Historical Provider, MD  atorvastatin (LIPITOR) 20 MG tablet Take 20 mg by mouth at bedtime.     Historical Provider, MD  Calcium Carbonate-Vitamin D (CALTRATE 600+D) 600-400 MG-UNIT per tablet Take 1 tablet by mouth daily.      Historical Provider, MD  carvedilol (COREG) 3.125 MG tablet Take 3.125 mg by mouth 2 (two) times daily. 04/06/16 04/06/17  Historical Provider, MD  cetirizine (ZYRTEC) 10 MG chewable tablet Chew 10 mg by mouth daily.      Historical Provider, MD  cloNIDine (CATAPRES) 0.1 MG tablet Take 0.1 mg by mouth 2 (two) times daily.     Historical Provider, MD  docusate sodium (COLACE) 100 MG capsule Take 1 capsule (100 mg total) by mouth daily as needed. 05/03/16 05/03/17  Darel Hong, MD  doxazosin (CARDURA) 8 MG tablet Take 1 tablet (8 mg total) by mouth daily. 02/16/16   Minna Merritts, MD  ferrous sulfate 325 (65 FE) MG tablet Take 325 mg by mouth daily with breakfast.    Historical Provider, MD  fluticasone (FLONASE) 50 MCG/ACT nasal spray Place 2 sprays into both nostrils at bedtime as needed for allergies or rhinitis. 05/25/15   Srikar Sudini, MD  furosemide (LASIX) 20 MG tablet Take 20 mg by mouth every other day. Takes 1 tab on Monday, Wed, and Friday    Historical Provider, MD  gabapentin (NEURONTIN) 300 MG capsule Take 1 capsule (300 mg total) by mouth at bedtime. 11/17/15 04/16/16  Milinda Pointer, MD  Multiple Vitamin (MULTIVITAMIN) tablet Take 1 tablet by mouth daily.    Historical Provider, MD  NONFORMULARY OR COMPOUNDED ITEM 10% Ketoprofen + 4% lidocaine in PLO gel. Sig: Apply to affected area daily to 4 times a day PRN Dispense: 60gm Pump Bottle 11/17/15   Milinda Pointer, MD  pantoprazole  (PROTONIX) 40 MG tablet Take 40 mg by mouth at bedtime.  02/14/15   Minna Merritts, MD  Probiotic Product ([place]) Take 1 capsule by mouth daily.    Historical Provider, MD  traMADol (ULTRAM) 50 MG tablet Take 1 tablet (50 mg total) by mouth every 6 (six) hours as needed for severe pain. Patient taking differently: Take 50 mg by mouth daily.  10/10/15   Milinda Pointer, MD    Allergies Oxycodone; Codeine; Hydrocodone-acetaminophen; Iodine; Meperidine hcl; Other; and Propoxyphene n-acetaminophen  Family History  Problem Relation Age of Onset  . Ovarian cancer Mother   . Liver cancer Father   . Heart failure Father   . Lung cancer Brother   . Prostate cancer Brother   . Lung cancer Sister     Social History Social History  Substance Use Topics  . Smoking status: Never Smoker  .  Smokeless tobacco: Never Used     Comment: Tobacco use- no   . Alcohol use No    Review of Systems Constitutional: No fever/chills Eyes: No visual changes. ENT: No sore throat. Cardiovascular: Denies chest pain. Respiratory: Denies shortness of breath. Gastrointestinal: No abdominal pain.  No nausea, no vomiting.  No diarrhea.  No constipation. Genitourinary: Positive for dysuria. Musculoskeletal: Negative for back pain. Skin: Negative for rash. Neurological: Negative for headaches, focal weakness or numbness.  10-point ROS otherwise negative.  ____________________________________________   PHYSICAL EXAM:  VITAL SIGNS: ED Triage Vitals  Enc Vitals Group     BP 05/03/16 1759 (!) 190/57     Pulse Rate 05/03/16 1759 (!) 48     Resp 05/03/16 1759 18     Temp 05/03/16 1759 97.8 F (36.6 C)     Temp Source 05/03/16 1759 Oral     SpO2 05/03/16 1759 98 %     Weight 05/03/16 1800 118 lb (53.5 kg)     Height 05/03/16 1800 5\' 4"  (1.626 m)     Head Circumference --      Peak Flow --      Pain Score 05/03/16 1800 10     Pain Loc --      Pain Edu? --      Excl. in [place]? --      Constitutional: Alert and oriented x 4 well appearing nontoxic no diaphoresis speaks in full, clear sentences Eyes: PERRL EOMI. Head: Atraumatic. Nose: No congestion/rhinnorhea. Mouth/Throat: No trismus Neck: No stridor.   Cardiovascular: Normal rate, regular rhythm. Grossly normal heart sounds.  Good peripheral circulation. Respiratory: Normal respiratory effort.  No retractions. Lungs CTAB and moving good air Gastrointestinal: Soft nondistended nontender no rebound no guarding no peritonitis no McBurney's tenderness negative Rovsing's no costovertebral tenderness negative Murphy's Musculoskeletal: No lower extremity edema   Neurologic:  Normal speech and language. No gross focal neurologic deficits are appreciated. Skin:  Skin is warm, dry and intact. No rash noted. Psychiatric: Mood and affect are normal. Speech and behavior are normal.  ____________________________________________   LABS (all labs ordered are listed, but only abnormal results are displayed)  Labs Reviewed  URINALYSIS, COMPLETE (UACMP) WITH MICROSCOPIC - Abnormal; Notable for the following:       Result Value   Color, Urine YELLOW (*)    APPearance CLEAR (*)    Specific Gravity, Urine 1.003 (*)    Protein, ur 100 (*)    All other components within normal limits   ____________________________________________  EKG   ____________________________________________  RADIOLOGY  Ct Head Wo Contrast  Result Date: 04/16/2016 CLINICAL DATA:  83 year old female tripped over dural way. Nausea and dizziness. Initial encounter. EXAM: CT HEAD WITHOUT CONTRAST CT CERVICAL SPINE WITHOUT CONTRAST TECHNIQUE: Multidetector CT imaging of the head and cervical spine was performed following the standard protocol without intravenous contrast. Multiplanar CT image reconstructions of the cervical spine were also generated. COMPARISON:  10/13/2015. FINDINGS: CT HEAD FINDINGS Brain: No intracranial hemorrhage or CT evidence of  large acute infarct. Chronic microvascular changes. Global atrophy without hydrocephalus. No intracranial mass lesion noted on this unenhanced exam. Vascular: Vascular calcifications. Skull: No skull fracture. Sinuses/Orbits: Post lens replacement without acute orbital abnormality. Sinuses which are visualized are clear. Other: Negative CT CERVICAL SPINE FINDINGS Alignment: Mild curvature. Skull base and vertebrae: No cervical spine fracture. Soft tissues and spinal canal: No abnormal prevertebral soft tissue swelling. Disc levels: Cervical spondylotic changes with spinal stenosis and foraminal narrowing most prominent C3-4 thru  C6-7. Upper chest: Scarring lung apex. Other: Carotid bifurcation calcifications. IMPRESSION: CT HEAD No skull fracture or intracranial hemorrhage. Chronic microvascular changes. Global atrophy without hydrocephalus. CT CERVICAL SPINE No cervical spine fracture. Cervical spondylotic changes with spinal stenosis and foraminal narrowing most prominent C3-4 thru C6-7. Electronically Signed   By: Genia Del M.D.   On: 04/16/2016 14:47   Ct Cervical Spine Wo Contrast  Result Date: 04/16/2016 CLINICAL DATA:  83 year old female tripped over dural way. Nausea and dizziness. Initial encounter. EXAM: CT HEAD WITHOUT CONTRAST CT CERVICAL SPINE WITHOUT CONTRAST TECHNIQUE: Multidetector CT imaging of the head and cervical spine was performed following the standard protocol without intravenous contrast. Multiplanar CT image reconstructions of the cervical spine were also generated. COMPARISON:  10/13/2015. FINDINGS: CT HEAD FINDINGS Brain: No intracranial hemorrhage or CT evidence of large acute infarct. Chronic microvascular changes. Global atrophy without hydrocephalus. No intracranial mass lesion noted on this unenhanced exam. Vascular: Vascular calcifications. Skull: No skull fracture. Sinuses/Orbits: Post lens replacement without acute orbital abnormality. Sinuses which are visualized are  clear. Other: Negative CT CERVICAL SPINE FINDINGS Alignment: Mild curvature. Skull base and vertebrae: No cervical spine fracture. Soft tissues and spinal canal: No abnormal prevertebral soft tissue swelling. Disc levels: Cervical spondylotic changes with spinal stenosis and foraminal narrowing most prominent C3-4 thru C6-7. Upper chest: Scarring lung apex. Other: Carotid bifurcation calcifications. IMPRESSION: CT HEAD No skull fracture or intracranial hemorrhage. Chronic microvascular changes. Global atrophy without hydrocephalus. CT CERVICAL SPINE No cervical spine fracture. Cervical spondylotic changes with spinal stenosis and foraminal narrowing most prominent C3-4 thru C6-7. Electronically Signed   By: Genia Del M.D.   On: 04/16/2016 14:47   Dg Abd Acute W/chest  Result Date: 05/03/2016 CLINICAL DATA:  83 y/o  F; constipation and abdominal pain. EXAM: DG ABDOMEN ACUTE W/ 1V CHEST COMPARISON:  10/14/2015 chest radiograph FINDINGS: Clear lungs. Aortic atherosclerosis with calcification. Stable normal cardiac silhouette given projection and technique. Multilevel degenerative changes of the spine with reverse S thoracolumbar curvature. Normal bowel gas pattern. Large volume of stool throughout the colon. Right pelvic phleboliths. Right upper quadrant surgical clips, presumably cholecystectomy. Stable mild loss of height of the L2 and L3 vertebral bodies. IMPRESSION: Normal bowel gas pattern. Large volume of stool in the colon. No acute cardiopulmonary process identified. Electronically Signed   By: Kristine Garbe M.D.   On: 05/03/2016 19:15   Dg Hand Complete Left  Result Date: 04/16/2016 CLINICAL DATA:  Fall, tripped over doorway, left hand third metacarpophalangeal joint EXAM: LEFT HAND - COMPLETE 3+ VIEW COMPARISON:  None. FINDINGS: Three views of the left hand submitted. No acute fracture or subluxation. Narrowing of radiocarpal joint space. Degenerative changes are noticed first  carpometacarpal joint. Degenerative changes are noted interphalangeal joint left thumb. Mild degenerative changes proximal interphalangeal joint third fourth and fifth finger. There is soft tissue swelling at the base of third finger. Degenerative changes distal interphalangeal joints second third and fourth finger. IMPRESSION: No acute fracture or subluxation. Multilevel degenerative changes as described above. Soft tissue swelling proximal aspect third finger. Electronically Signed   By: Lahoma Crocker M.D.   On: 04/16/2016 14:38    ____________________________________________   PROCEDURES  Procedure(s) performed: yes  ------------------------------------------------------------------------------------------------------------------- Fecal Disimpaction Procedure Note:  Performed by me:  Patient placed in the lateral recumbent position with knees drawn towards chest. Nurse present for patient support. Large amount of hard brown stool removed. No complications during procedure.   ------------------------------------------------------------------------------------------------------------------    Procedures  Critical Care  performed: no  ____________________________________________   INITIAL IMPRESSION / ASSESSMENT AND PLAN / ED COURSE  Pertinent labs & imaging results that were available during my care of the patient were reviewed by me and considered in my medical decision making (see chart for details).  The patient arrived with a benign and nonpertinent headache abdomen and was not obstructed. X-ray confirms a large amount of stool in her colon. Urinalysis done without evidence of infection. Using a Urojet no making I placed the patient left lateral decubitus and was able to disimpact a large amount of hard stool. She was then subsequently able to have a large bowel movement on her own. She feels improved. She is able to follow up with the primary care physician on Monday. I encouraged  her to increase the fiber in her diet prescribed for Colace. She is medically stable for outpatient management.      ____________________________________________   FINAL CLINICAL IMPRESSION(S) / ED DIAGNOSES  Final diagnoses:  Dysuria  Fecal impaction ([place])      NEW MEDICATIONS STARTED DURING THIS VISIT:  New Prescriptions   DOCUSATE SODIUM (COLACE) 100 MG CAPSULE    Take 1 capsule (100 mg total) by mouth daily as needed.     Note:  This document was prepared using Dragon voice recognition software and may include unintentional dictation errors.     Darel Hong, MD 05/03/16 2023

## 2016-05-05 ENCOUNTER — Inpatient Hospital Stay
Admission: EM | Admit: 2016-05-05 | Discharge: 2016-05-09 | DRG: 167 | Disposition: A | Discharge status: Home Health | Payer: Medicare Other | Attending: Internal Medicine | Admitting: Internal Medicine

## 2016-05-05 ENCOUNTER — Emergency Department: Payer: Medicare Other

## 2016-05-05 DIAGNOSIS — K219 Gastro-esophageal reflux disease without esophagitis: Secondary | ICD-10-CM | POA: Diagnosis present

## 2016-05-05 DIAGNOSIS — J189 Pneumonia, unspecified organism: Secondary | ICD-10-CM | POA: Diagnosis present

## 2016-05-05 DIAGNOSIS — M109 Gout, unspecified: Secondary | ICD-10-CM | POA: Diagnosis present

## 2016-05-05 DIAGNOSIS — Z79899 Other long term (current) drug therapy: Secondary | ICD-10-CM | POA: Diagnosis not present

## 2016-05-05 DIAGNOSIS — N184 Chronic kidney disease, stage 4 (severe): Secondary | ICD-10-CM | POA: Diagnosis present

## 2016-05-05 DIAGNOSIS — F0390 Unspecified dementia without behavioral disturbance: Secondary | ICD-10-CM | POA: Diagnosis present

## 2016-05-05 DIAGNOSIS — I160 Hypertensive urgency: Secondary | ICD-10-CM | POA: Diagnosis present

## 2016-05-05 DIAGNOSIS — I701 Atherosclerosis of renal artery: Secondary | ICD-10-CM | POA: Diagnosis present

## 2016-05-05 DIAGNOSIS — E785 Hyperlipidemia, unspecified: Secondary | ICD-10-CM | POA: Diagnosis present

## 2016-05-05 DIAGNOSIS — J90 Pleural effusion, not elsewhere classified: Secondary | ICD-10-CM | POA: Diagnosis present

## 2016-05-05 DIAGNOSIS — Z91013 Allergy to seafood: Secondary | ICD-10-CM | POA: Diagnosis not present

## 2016-05-05 DIAGNOSIS — R0602 Shortness of breath: Secondary | ICD-10-CM | POA: Diagnosis present

## 2016-05-05 DIAGNOSIS — H9190 Unspecified hearing loss, unspecified ear: Secondary | ICD-10-CM | POA: Diagnosis present

## 2016-05-05 DIAGNOSIS — Z96653 Presence of artificial knee joint, bilateral: Secondary | ICD-10-CM | POA: Diagnosis present

## 2016-05-05 DIAGNOSIS — Z85820 Personal history of malignant melanoma of skin: Secondary | ICD-10-CM | POA: Diagnosis not present

## 2016-05-05 DIAGNOSIS — Z7982 Long term (current) use of aspirin: Secondary | ICD-10-CM

## 2016-05-05 DIAGNOSIS — E871 Hypo-osmolality and hyponatremia: Secondary | ICD-10-CM | POA: Diagnosis present

## 2016-05-05 DIAGNOSIS — Z888 Allergy status to other drugs, medicaments and biological substances status: Secondary | ICD-10-CM

## 2016-05-05 DIAGNOSIS — I129 Hypertensive chronic kidney disease with stage 1 through stage 4 chronic kidney disease, or unspecified chronic kidney disease: Secondary | ICD-10-CM | POA: Diagnosis present

## 2016-05-05 DIAGNOSIS — I10 Essential (primary) hypertension: Secondary | ICD-10-CM | POA: Diagnosis not present

## 2016-05-05 DIAGNOSIS — Z885 Allergy status to narcotic agent status: Secondary | ICD-10-CM | POA: Diagnosis not present

## 2016-05-05 LAB — BASIC METABOLIC PANEL
Anion gap: 10 (ref 5–15)
BUN: 31 mg/dL — AB (ref 6–20)
CALCIUM: 8.4 mg/dL — AB (ref 8.9–10.3)
CHLORIDE: 86 mmol/L — AB (ref 101–111)
CO2: 28 mmol/L (ref 22–32)
CREATININE: 1.62 mg/dL — AB (ref 0.44–1.00)
GFR calc Af Amer: 31 mL/min — ABNORMAL LOW (ref >60)
GFR calc non Af Amer: 27 mL/min — ABNORMAL LOW (ref >60)
GLUCOSE: 130 mg/dL — AB (ref 65–99)
Potassium: 3.9 mmol/L (ref 3.5–5.1)
Sodium: 124 mmol/L — ABNORMAL LOW (ref 135–145)

## 2016-05-05 LAB — CBC
HCT: 31.2 % — ABNORMAL LOW (ref 35.0–47.0)
Hemoglobin: 10.8 g/dL — ABNORMAL LOW (ref 12.0–16.0)
MCH: 30.9 pg (ref 26.0–34.0)
MCHC: 34.7 g/dL (ref 32.0–36.0)
MCV: 89.1 fL (ref 80.0–100.0)
PLATELETS: 194 10*3/uL (ref 150–440)
RBC: 3.5 MIL/uL — ABNORMAL LOW (ref 3.80–5.20)
RDW: 13.6 % (ref 11.5–14.5)
WBC: 7.4 10*3/uL (ref 3.6–11.0)

## 2016-05-05 LAB — SODIUM
SODIUM: 125 mmol/L — AB (ref 135–145)
SODIUM: 125 mmol/L — AB (ref 135–145)

## 2016-05-05 LAB — URINALYSIS, COMPLETE (UACMP) WITH MICROSCOPIC
BACTERIA UA: NONE SEEN
BILIRUBIN URINE: NEGATIVE
GLUCOSE, UA: NEGATIVE mg/dL
Hgb urine dipstick: NEGATIVE
KETONES UR: NEGATIVE mg/dL
NITRITE: NEGATIVE
PROTEIN: 100 mg/dL — AB
SQUAMOUS EPITHELIAL / LPF: NONE SEEN
Specific Gravity, Urine: 1.002 — ABNORMAL LOW (ref 1.005–1.030)
pH: 7 (ref 5.0–8.0)

## 2016-05-05 LAB — OSMOLALITY, URINE: OSMOLALITY UR: 100 mosm/kg — AB (ref 300–900)

## 2016-05-05 LAB — SODIUM, URINE, RANDOM: Sodium, Ur: <10 mmol/L

## 2016-05-05 LAB — TROPONIN I

## 2016-05-05 LAB — OSMOLALITY: OSMOLALITY: 270 mosm/kg — AB (ref 275–295)

## 2016-05-05 MED ORDER — LORATADINE 10 MG PO TABS
10.0000 mg | ORAL_TABLET | Freq: Every day | ORAL | Status: DC
  Administered 2016-05-06 – 2016-05-09 (×4): 10 mg via ORAL
  Filled 2016-05-05 (×4): qty 1

## 2016-05-05 MED ORDER — ENOXAPARIN SODIUM 30 MG/0.3ML ~~LOC~~ SOLN
30.0000 mg | SUBCUTANEOUS | Status: DC
Start: 2016-05-05 — End: 2016-05-09
  Administered 2016-05-05 – 2016-05-08 (×4): 30 mg via SUBCUTANEOUS
  Filled 2016-05-05 (×4): qty 0.3

## 2016-05-05 MED ORDER — SODIUM CHLORIDE 0.9 % IV SOLN: INTRAVENOUS | Status: AC

## 2016-05-05 MED ORDER — HYDRALAZINE HCL 20 MG/ML IJ SOLN
10.0000 mg | Freq: Once | INTRAMUSCULAR | Status: AC
  Administered 2016-05-05: 10 mg via INTRAVENOUS
  Filled 2016-05-05: qty 1

## 2016-05-05 MED ORDER — GABAPENTIN 300 MG PO CAPS
300.0000 mg | ORAL_CAPSULE | Freq: Every day | ORAL | Status: DC
  Administered 2016-05-05 – 2016-05-08 (×4): 300 mg via ORAL
  Filled 2016-05-05 (×4): qty 1

## 2016-05-05 MED ORDER — DEXTROSE 5 % IV SOLN: 1.0000 g | Freq: Once | INTRAVENOUS | Status: DC

## 2016-05-05 MED ORDER — ALLOPURINOL 100 MG PO TABS
100.0000 mg | ORAL_TABLET | Freq: Two times a day (BID) | ORAL | Status: DC
  Administered 2016-05-05 – 2016-05-09 (×8): 100 mg via ORAL
  Filled 2016-05-05 (×8): qty 1

## 2016-05-05 MED ORDER — PHILLIPS COLON HEALTH PO CAPS: ORAL_CAPSULE | Freq: Every day | ORAL | Status: DC

## 2016-05-05 MED ORDER — FUROSEMIDE 20 MG PO TABS
20.0000 mg | ORAL_TABLET | ORAL | Status: DC
  Administered 2016-05-09: 20 mg via ORAL
  Filled 2016-05-05: qty 1

## 2016-05-05 MED ORDER — FERROUS SULFATE 325 (65 FE) MG PO TABS
325.0000 mg | ORAL_TABLET | Freq: Every day | ORAL | Status: DC
  Administered 2016-05-06 – 2016-05-09 (×4): 325 mg via ORAL
  Filled 2016-05-05 (×4): qty 1

## 2016-05-05 MED ORDER — HYDRALAZINE HCL 25 MG PO TABS
25.0000 mg | ORAL_TABLET | Freq: Three times a day (TID) | ORAL | Status: DC
  Administered 2016-05-05 – 2016-05-09 (×12): 25 mg via ORAL
  Filled 2016-05-05 (×7): qty 1
  Filled 2016-05-05: qty 2
  Filled 2016-05-05 (×5): qty 1

## 2016-05-05 MED ORDER — SODIUM CHLORIDE 0.9 % IV BOLUS (SEPSIS)
1000.0000 mL | Freq: Once | INTRAVENOUS | Status: AC
  Administered 2016-05-05: 1000 mL via INTRAVENOUS

## 2016-05-05 MED ORDER — CARVEDILOL 6.25 MG PO TABS
3.1250 mg | ORAL_TABLET | Freq: Two times a day (BID) | ORAL | Status: DC
  Administered 2016-05-05 – 2016-05-06 (×2): 3.125 mg via ORAL
  Filled 2016-05-05 (×2): qty 1

## 2016-05-05 MED ORDER — DEXTROSE 5 % IV SOLN
1.0000 g | INTRAVENOUS | Status: DC
  Administered 2016-05-06 – 2016-05-07 (×2): 1 g via INTRAVENOUS
  Filled 2016-05-05 (×3): qty 10

## 2016-05-05 MED ORDER — ASPIRIN EC 81 MG PO TBEC
81.0000 mg | DELAYED_RELEASE_TABLET | Freq: Every day | ORAL | Status: DC
  Administered 2016-05-05 – 2016-05-09 (×5): 81 mg via ORAL
  Filled 2016-05-05 (×5): qty 1

## 2016-05-05 MED ORDER — VITAMIN C 500 MG PO TABS
1000.0000 mg | ORAL_TABLET | Freq: Every day | ORAL | Status: DC
  Administered 2016-05-06 – 2016-05-09 (×3): 1000 mg via ORAL
  Filled 2016-05-05 (×4): qty 2

## 2016-05-05 MED ORDER — ADULT MULTIVITAMIN W/MINERALS CH
1.0000 | ORAL_TABLET | Freq: Every day | ORAL | Status: DC
  Administered 2016-05-06 – 2016-05-09 (×4): 1 via ORAL
  Filled 2016-05-05 (×4): qty 1

## 2016-05-05 MED ORDER — CEFTRIAXONE SODIUM-DEXTROSE 1-3.74 GM-% IV SOLR
1.0000 g | Freq: Once | INTRAVENOUS | Status: AC
  Administered 2016-05-05: 1 g via INTRAVENOUS
  Filled 2016-05-05: qty 50

## 2016-05-05 MED ORDER — ATORVASTATIN CALCIUM 20 MG PO TABS
20.0000 mg | ORAL_TABLET | Freq: Every day | ORAL | Status: DC
  Administered 2016-05-05 – 2016-05-08 (×4): 20 mg via ORAL
  Filled 2016-05-05 (×4): qty 1

## 2016-05-05 MED ORDER — PANTOPRAZOLE SODIUM 40 MG PO TBEC
40.0000 mg | DELAYED_RELEASE_TABLET | Freq: Every day | ORAL | Status: DC
  Administered 2016-05-05 – 2016-05-08 (×4): 40 mg via ORAL
  Filled 2016-05-05 (×4): qty 1

## 2016-05-05 MED ORDER — DEXTROSE 5 % IV SOLN
500.0000 mg | Freq: Once | INTRAVENOUS | Status: AC
  Administered 2016-05-05: 500 mg via INTRAVENOUS
  Filled 2016-05-05: qty 500

## 2016-05-05 MED ORDER — FLUTICASONE PROPIONATE 50 MCG/ACT NA SUSP
2.0000 | Freq: Every evening | NASAL | Status: DC | PRN
Start: 2016-05-05 — End: 2016-05-09
  Filled 2016-05-05: qty 16

## 2016-05-05 MED ORDER — CLONIDINE HCL 0.1 MG PO TABS
0.1000 mg | ORAL_TABLET | Freq: Two times a day (BID) | ORAL | Status: DC
  Administered 2016-05-05 – 2016-05-09 (×8): 0.1 mg via ORAL
  Filled 2016-05-05 (×8): qty 1

## 2016-05-05 MED ORDER — HYDRALAZINE HCL 20 MG/ML IJ SOLN
10.0000 mg | Freq: Four times a day (QID) | INTRAMUSCULAR | Status: DC | PRN
  Administered 2016-05-05 – 2016-05-09 (×2): 10 mg via INTRAVENOUS
  Filled 2016-05-05 (×2): qty 1

## 2016-05-05 MED ORDER — DEXTROSE 5 % IV SOLN: 1.0000 g | INTRAVENOUS | Status: DC

## 2016-05-05 MED ORDER — DOCUSATE SODIUM 100 MG PO CAPS
100.0000 mg | ORAL_CAPSULE | Freq: Every day | ORAL | Status: DC | PRN
  Administered 2016-05-06 – 2016-05-08 (×2): 100 mg via ORAL
  Filled 2016-05-05 (×3): qty 1

## 2016-05-05 MED ORDER — DOXAZOSIN MESYLATE 4 MG PO TABS
8.0000 mg | ORAL_TABLET | Freq: Every day | ORAL | Status: DC
  Administered 2016-05-06 – 2016-05-09 (×4): 8 mg via ORAL
  Filled 2016-05-05 (×4): qty 2

## 2016-05-05 MED ORDER — TRAMADOL HCL 50 MG PO TABS
50.0000 mg | ORAL_TABLET | Freq: Four times a day (QID) | ORAL | Status: DC | PRN
  Administered 2016-05-05 – 2016-05-06 (×2): 50 mg via ORAL
  Filled 2016-05-05 (×3): qty 1

## 2016-05-05 MED ORDER — AZITHROMYCIN 250 MG PO TABS
500.0000 mg | ORAL_TABLET | Freq: Every day | ORAL | Status: DC
  Administered 2016-05-06 – 2016-05-09 (×4): 500 mg via ORAL
  Filled 2016-05-05 (×4): qty 2

## 2016-05-05 MED ORDER — HYDRALAZINE HCL 25 MG PO TABS
25.0000 mg | ORAL_TABLET | Freq: Once | ORAL | Status: AC
  Administered 2016-05-05: 25 mg via ORAL
  Filled 2016-05-05: qty 1

## 2016-05-05 MED ORDER — CALCIUM CARBONATE-VITAMIN D 500-200 MG-UNIT PO TABS
1.0000 | ORAL_TABLET | Freq: Every day | ORAL | Status: DC
  Administered 2016-05-06 – 2016-05-09 (×4): 1 via ORAL
  Filled 2016-05-05 (×4): qty 1

## 2016-05-05 MED ORDER — ALBUTEROL SULFATE (2.5 MG/3ML) 0.083% IN NEBU
2.5000 mg | INHALATION_SOLUTION | Freq: Four times a day (QID) | RESPIRATORY_TRACT | Status: DC | PRN
  Administered 2016-05-05: 2.5 mg via RESPIRATORY_TRACT
  Filled 2016-05-05 (×2): qty 3

## 2016-05-05 NOTE — ED Triage Notes (Addendum)
Pt c/o left sided chest pain that began last night. Pt reports SOB and weakness as well. Pt hx of kidney disease, family reports BP has been in 916'B systolic. Recent change in BP medications to up dose per family.     Pt alert and oriented X4, active, cooperative, pt in NAD. RR even and unlabored, color WNL.

## 2016-05-05 NOTE — ED Provider Notes (Signed)
[facility] Emergency Department Provider Note ____________________________________________   I have reviewed the triage vital signs and the triage nursing note.  HISTORY  Chief Complaint Chest Pain   Historian Limited history from patient as she has some mild dementia Additional history from daughter and son-in-law with whom the patient lives  HPI Joanne Michael is a 83 y.o. female presents feeling weak this morning. No focal weakness. She states that she's felt shortness of breath or was found for maybe a week or 2. No fever or productive cough. This morning she felt trembly without altered mental status or seizure.  Family reports that she's had some mild confusion ever since striking her head which she was evaluated for a week or so ago.  Denies abdominal pain, nausea, vomiting, or diarrhea. States that she has chronic kidney disease.    Past Medical History:  Diagnosis Date  . Arthritis   . Cancer ([place])    melanoma  . Cat allergies   . Chronic renal failure   . Fatigue   . GERD (gastroesophageal reflux disease)   . Gout   . H/O Clostridium difficile infection   . Hay fever    as child  . Hiatal hernia   . HLD (hyperlipidemia)   . HTN (hypertension)   . Iron deficiency anemia   . Spinal stenosis     Patient Active Problem List   Diagnosis Date Noted  . Iron deficiency anemia 11/21/2015  . Gout, joint 10/10/2015  . Pure hypercholesterolemia 10/10/2015  . Chronic hip pain (Bilateral) 07/13/2015  . Posterior arthritis of hip (Bilateral) 07/13/2015  . Chronic sacroiliac joint pain (Bilateral) 07/13/2015  . Osteoarthritis of sacroiliac joint (Bilateral) 07/13/2015  . Lumbar spinal stenosis 07/13/2015  . Acute renal failure (ARF) ([place]) 05/22/2015  . Kidney failure 04/26/2015  . Spinal stenosis 04/18/2015  . Osteoporosis, post-menopausal 04/18/2015  . Arthritis, degenerative 04/18/2015  . 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 Combined fat and carbohydrate induced hyperlipemia 04/18/2015  . Anemia, iron deficiency 04/18/2015  . Arthritis urica 04/18/2015  . Gastro-esophageal reflux disease without esophagitis 04/18/2015  . Essential (primary) hypertension 04/18/2015  . CKD (chronic kidney disease) stage 4, GFR 15-29 ml/min (HCC) 04/18/2015  . Chronic low back pain (Location of Primary Source of Pain) (Bilateral) (R>L) 04/18/2015  . Lumbar facet syndrome (Location of Primary Source of Pain) (Bilateral) (R>L) 04/18/2015  . Lumbar spondylosis 04/18/2015  . Chronic pain 04/18/2015  . Long term current use of opiate analgesic 04/18/2015  . Long term prescription opiate use 04/18/2015  . Opiate use 04/18/2015  . Encounter for therapeutic drug level monitoring 04/18/2015  . Encounter for chronic pain management 04/18/2015  . Chronic lower extremity pain (referred) (Location of Secondary source of pain) (Bilateral) (R>L) 04/18/2015  . Neurogenic pain 04/18/2015  . Heart murmur, systolic 85/27/7824  . Pulmonary parenchymal mass 12/02/2013  . Lung mass 12/02/2013  . SOB (shortness of breath) 11/03/2013  . Cough 11/03/2013  . Bilateral leg edema 07/22/2013  . Chronic renal insufficiency 07/22/2013  . Aortic valve stenosis 07/22/2013  . MURMUR 05/26/2009  . Hyperlipidemia 05/02/2009  . ESSENTIAL HYPERTENSION, BENIGN 05/02/2009  . SHORTNESS OF BREATH 05/02/2009  . CHEST PAIN UNSPECIFIED 05/02/2009    Past Surgical History:  Procedure Laterality Date  . APPENDECTOMY    . BACK SURGERY    . CATARACT EXTRACTION    . CHOLECYSTECTOMY    . feet surgery    . HERNIA REPAIR    .  knee replacement- both    . MELANOMA EXCISION    . NOSE SURGERY    . ROTATOR CUFF REPAIR     left  . SHOULDER SURGERY    . TONSILLECTOMY    . TOTAL ABDOMINAL HYSTERECTOMY      Prior to Admission medications   Medication Sig Start Date End Date Taking? Authorizing Provider  albuterol (PROVENTIL HFA;VENTOLIN HFA) 108 (90  BASE) MCG/ACT inhaler Inhale 1-2 puffs into the lungs every 6 (six) hours as needed for wheezing or shortness of breath. 02/14/15  Yes Minna Merritts, MD  allopurinol (ZYLOPRIM) 100 MG tablet Take 100 mg by mouth 2 (two) times daily.   Yes Historical Provider, MD  ascorbic acid (VITAMIN C) 1000 MG tablet Take 1,000 mg by mouth daily.   Yes Historical Provider, MD  aspirin EC 81 MG tablet Take 81 mg by mouth daily.   Yes Historical Provider, MD  atorvastatin (LIPITOR) 20 MG tablet Take 20 mg by mouth at bedtime.    Yes Historical Provider, MD  Calcium Carbonate-Vitamin D (CALTRATE 600+D) 600-400 MG-UNIT per tablet Take 1 tablet by mouth daily.     Yes Historical Provider, MD  carvedilol (COREG) 3.125 MG tablet Take 3.125 mg by mouth 2 (two) times daily. 04/06/16 04/06/17 Yes Historical Provider, MD  cetirizine (ZYRTEC) 10 MG chewable tablet Chew 10 mg by mouth daily.     Yes Historical Provider, MD  cloNIDine (CATAPRES) 0.1 MG tablet Take 0.1 mg by mouth 2 (two) times daily.    Yes Historical Provider, MD  docusate sodium (COLACE) 100 MG capsule Take 1 capsule (100 mg total) by mouth daily as needed. 05/03/16 05/03/17 Yes Darel Hong, MD  doxazosin (CARDURA) 8 MG tablet Take 1 tablet (8 mg total) by mouth daily. 02/16/16  Yes Minna Merritts, MD  ferrous sulfate 325 (65 FE) MG tablet Take 325 mg by mouth daily with breakfast.   Yes Historical Provider, MD  fluticasone (FLONASE) 50 MCG/ACT nasal spray Place 2 sprays into both nostrils at bedtime as needed for allergies or rhinitis. 05/25/15  Yes Srikar Sudini, MD  furosemide (LASIX) 20 MG tablet Take 20 mg by mouth every other day. Takes 1 tab on Monday, Wed, and Friday   Yes Historical Provider, MD  gabapentin (NEURONTIN) 300 MG capsule Take 1 capsule (300 mg total) by mouth at bedtime. 11/17/15 05/05/16 Yes Milinda Pointer, MD  Multiple Vitamin (MULTIVITAMIN) tablet Take 1 tablet by mouth daily.   Yes Historical Provider, MD  pantoprazole (PROTONIX) 40  MG tablet Take 40 mg by mouth at bedtime.  02/14/15  Yes Minna Merritts, MD  Probiotic Product ([place] PO) Take 1 capsule by mouth daily.   Yes Historical Provider, MD  traMADol (ULTRAM) 50 MG tablet Take 1 tablet (50 mg total) by mouth every 6 (six) hours as needed for severe pain. Patient taking differently: Take 50 mg by mouth daily.  10/10/15  Yes Milinda Pointer, MD    Allergies  Allergen Reactions  . Oxycodone Itching  . Codeine Nausea And Vomiting and Itching  . Hydrocodone-Acetaminophen Other (See Comments)    Hallucinates  . Iodine Swelling, Other (See Comments), Itching and Nausea Only    Patient is allergic to seafood and this is the cause why she can't take medication.  . Meperidine Hcl     Unknown reactions.  . Other Swelling    Seafood- tongue swelling and discoloration Patient allergic to seafood, tongue swelling and discoloration.  . Propoxyphene N-Acetaminophen  Other (See Comments)    Unknown reaction    Family History  Problem Relation Age of Onset  . Ovarian cancer Mother   . Liver cancer Father   . Heart failure Father   . Lung cancer Brother   . Prostate cancer Brother   . Lung cancer Sister     Social History Social History  Substance Use Topics  . Smoking status: Never Smoker  . Smokeless tobacco: Never Used     Comment: Tobacco use- no   . Alcohol use No    Review of Systems  Constitutional: Negative for fever. Eyes: Negative for visual changes. ENT: Negative for sore throat. Cardiovascular: She is experiencing what she calls chest pain below the right breast stretching across to the left chest. No back pain.Marland Kitchen Respiratory: Positive for shortness of breath. Gastrointestinal: Negative for abdominal pain, vomiting and diarrhea. Genitourinary: Negative for dysuria. Musculoskeletal: Negative for back pain. Skin: Negative for rash. Neurological: Negative for headache. 10 point Review of Systems otherwise  negative ____________________________________________   PHYSICAL EXAM:  VITAL SIGNS: ED Triage Vitals  Enc Vitals Group     BP 05/05/16 1205 (!) 205/55     Pulse Rate 05/05/16 1205 (!) 51     Resp 05/05/16 1205 18     Temp 05/05/16 1205 97.5 F (36.4 C)     Temp Source 05/05/16 1205 Oral     SpO2 05/05/16 1205 96 %     Weight 05/05/16 1205 120 lb (54.4 kg)     Height 05/05/16 1205 5\' 4"  (1.626 m)     Head Circumference --      Peak Flow --      Pain Score 05/05/16 1206 8     Pain Loc --      Pain Edu? --      Excl. in [place]? --      Constitutional: Alert and oriented. Well appearing and in no distress. HEENT   Head: Normocephalic and atraumatic.      Eyes: Conjunctivae are normal. PERRL. Normal extraocular movements.      Ears:         Nose: No congestion/rhinnorhea.   Mouth/Throat: Mucous membranes are moist.   Neck: No stridor. Cardiovascular/Chest: Normal rate, regular rhythm.  No murmurs, rubs, or gallops. Respiratory: Normal respiratory effort without tachypnea nor retractions. Breath sounds are clear and equal bilaterally. No wheezes/rales/rhonchi. Mild decreased air movement throughout all fields. Gastrointestinal: Soft. No distention, no guarding, no rebound. Nontender.    Genitourinary/rectal:Deferred Musculoskeletal: Nontender with normal range of motion in all extremities. No joint effusions.  No lower extremity tenderness.  No edema. Neurologic:  Normal speech and language. No gross or focal neurologic deficits are appreciated. Skin:  Skin is warm, dry and intact. No rash noted.  No evidence of shingles. Psychiatric: Mood and affect are normal. Speech and behavior are normal. Patient exhibits appropriate insight and judgment.   ____________________________________________  LABS (pertinent positives/negatives)  Labs Reviewed  BASIC METABOLIC PANEL - Abnormal; Notable for the following:       Result Value   Sodium 124 (*)    Chloride 86 (*)     Glucose, Bld 130 (*)    BUN 31 (*)    Creatinine, Ser 1.62 (*)    Calcium 8.4 (*)    GFR calc non Af Amer 27 (*)    GFR calc Af Amer 31 (*)    All other components within normal limits  CBC - Abnormal; Notable for the following:  RBC 3.50 (*)    Hemoglobin 10.8 (*)    HCT 31.2 (*)    All other components within normal limits  CULTURE, BLOOD (ROUTINE X 2)  CULTURE, BLOOD (ROUTINE X 2)  TROPONIN I  URINALYSIS, COMPLETE (UACMP) WITH MICROSCOPIC    ____________________________________________    EKG I, Lisa Roca, MD, the attending physician have personally viewed and interpreted all ECGs.  54 bpm. Sinus bradycardia. Narrow QRS. Normal axis. Occasional PAC. Nonspecific ST and T-wave ____________________________________________  RADIOLOGY All Xrays were viewed by me. Imaging interpreted by Radiologist.  Chest xray 2view:  IMPRESSION: 1. Patchy bibasilar lung opacities, left greater than right, suggesting multilobar pneumonia/ aspiration. Recommend follow-up chest imaging to resolution . 2. New small left and trace right pleural effusions. 3. Aortic atherosclerosis. __________________________________________  PROCEDURES  Procedure(s) performed: None  Critical Care performed: None  ____________________________________________   ED COURSE / ASSESSMENT AND PLAN  Pertinent labs & imaging results that were available during my care of the patient were reviewed by me and considered in my medical decision making (see chart for details).   Ms. Azzarello is brought in with her family for feeling weak this morning and having chest discomfort. EKG is reassuring. Auditory studies are reassuring for no evidence of heart attack at this point time. She does have acute hyponatremia. She was started on 1 L normal saline bolus.  Chest x-ray showed possible bibasilar infiltrate/pneumonia. Given the complaint of shortness of breath and the new hyponatremia, possible that this is an  actual pneumonia although she's not having any hypoxia, hypertension, tachycardia, or elevated white blood cell count. No evidence of sepsis.  Blood cultures drawn before antibiotics, for community acquired pneumonia.    CONSULTATIONS:  Hospitalist for admission.  Patient / Family / Caregiver informed of clinical course, medical decision-making process, and agree with plan.   ___________________________________________   FINAL CLINICAL IMPRESSION(S) / ED DIAGNOSES   Final diagnoses:  Hyponatremia  Pneumonia, unspecified organism              Note: This dictation was prepared with Dragon dictation. Any transcriptional errors that result from this process are unintentional    Lisa Roca, MD 05/05/16 1459

## 2016-05-05 NOTE — Progress Notes (Signed)
Family Meeting Note  Advance Directive:yes  Today a meeting took place with the Patient, daughter and son-in-law    The following clinical team members were present during this meeting:MD  The following were discussed:Patient's diagnosis: Community-acquired pneumonia, hyponatremia and discussed along with the management and plan of care with the patient, daughter and son-in-law at bedside , Patient's progosis: Unable to determine and Goals for treatment: Full Code.  Additional follow-up to be provided: Hospitalist and nephrology  Time spent during discussion:18 min  Kathyjo Briere, Illene Silver, MD

## 2016-05-05 NOTE — ED Notes (Signed)
Assisted pt to the bathroom to void  

## 2016-05-05 NOTE — ED Notes (Signed)
Pt reports that she is having chest pain with shortness of breath - chest pain started around 77mn - pt reports a feeling of heaviness on her chest with her left arm/shoulder pain - pt sees a cardiologist for a "leaky valve" - pt reports nausea but denies vomiting - pt was in the ED Thursday night with an impaction and it was cleared - pt has been having regular BM's since Thursday

## 2016-05-05 NOTE — ED Notes (Signed)
Will obtain urine the next time pt has to void

## 2016-05-05 NOTE — H&P (Signed)
[facility] [place] NAME: Joanne Michael    MR#:  [id number]  DATE OF BIRTH:  [birth year]  DATE OF ADMISSION:  05/05/2016  PRIMARY CARE PHYSICIAN: Dion Body, MD   REQUESTING/REFERRING PHYSICIAN: Dr. Reita Cliche  CHIEF COMPLAINT:   Shortness of breath and chest pain  HISTORY OF PRESENT ILLNESS:  Joanne Michael  is a 83 y.o. female with a known history of Stage IV chronic kidney disease sees Dr. Candiss Norse as an outpatient, uncontrolled hypertension, GERD, hyperlipidemia and multiple other medical problems is presenting to the ED with a chief complaint of shortness of breath associated with chest pain and rib pain when she is coughing. Patient is feeling weak and shortness of breath is progressively getting worse for the past week. Patient is bringing up whitish yellow phlegm and came into the emergency department chest x-ray has revealed left greater than right-sided infiltrate with some pleural effusion. Patient was given IV Rocephin and azithromycin and hospitalist team is called to admit the patient. Patient's sodium is at 124 1 L fluid bolus was given. Patient is mentating fine.  PAST MEDICAL HISTORY:   Past Medical History:  Diagnosis Date  . Arthritis   . Cancer ([place])    melanoma  . Cat allergies   . Chronic renal failure   . Fatigue   . GERD (gastroesophageal reflux disease)   . Gout   . H/O Clostridium difficile infection   . Hay fever    as child  . Hiatal hernia   . HLD (hyperlipidemia)   . HTN (hypertension)   . Iron deficiency anemia   . Spinal stenosis     PAST SURGICAL HISTOIRY:   Past Surgical History:  Procedure Laterality Date  . APPENDECTOMY    . BACK SURGERY    . CATARACT EXTRACTION    . CHOLECYSTECTOMY    . feet surgery    . HERNIA REPAIR    . knee replacement- both    . MELANOMA EXCISION    . NOSE SURGERY    . ROTATOR CUFF REPAIR     left  . SHOULDER SURGERY    . TONSILLECTOMY    . TOTAL  ABDOMINAL HYSTERECTOMY      SOCIAL HISTORY:   Social History  Substance Use Topics  . Smoking status: Never Smoker  . Smokeless tobacco: Never Used     Comment: Tobacco use- no   . Alcohol use No    FAMILY HISTORY:   Family History  Problem Relation Age of Onset  . Ovarian cancer Mother   . Liver cancer Father   . Heart failure Father   . Lung cancer Brother   . Prostate cancer Brother   . Lung cancer Sister     DRUG ALLERGIES:   Allergies  Allergen Reactions  . Oxycodone Itching  . Codeine Nausea And Vomiting and Itching  . Hydrocodone-Acetaminophen Other (See Comments)    Hallucinates  . Iodine Swelling, Other (See Comments), Itching and Nausea Only    Patient is allergic to seafood and this is the cause why she can't take medication.  . Meperidine Hcl     Unknown reactions.  . Other Swelling    Seafood- tongue swelling and discoloration Patient allergic to seafood, tongue swelling and discoloration.  . Propoxyphene N-Acetaminophen Other (See Comments)    Unknown reaction    REVIEW OF SYSTEMS:  CONSTITUTIONAL: No fever, fatigue or weakness.  EYES: No blurred or double vision.  EARS, NOSE, AND  THROAT: No tinnitus or ear pain.  RESPIRATORY: Reporting productive cough and shortness of breath denies any wheezing or hemoptysis.  CARDIOVASCULAR: No chest pain, orthopnea, edema.  GASTROINTESTINAL: No nausea, vomiting, diarrhea or abdominal pain.  GENITOURINARY: No dysuria, hematuria.  ENDOCRINE: No polyuria, nocturia,  HEMATOLOGY: No anemia, easy bruising or bleeding SKIN: No rash or lesion. MUSCULOSKELETAL: No joint pain or arthritis.   NEUROLOGIC: No tingling, numbness, weakness.  PSYCHIATRY: No anxiety or depression.   MEDICATIONS AT HOME:   Prior to Admission medications   Medication Sig Start Date End Date Taking? Authorizing Provider  albuterol (PROVENTIL HFA;VENTOLIN HFA) 108 (90 BASE) MCG/ACT inhaler Inhale 1-2 puffs into the lungs every 6 (six) hours  as needed for wheezing or shortness of breath. 02/14/15  Yes Minna Merritts, MD  allopurinol (ZYLOPRIM) 100 MG tablet Take 100 mg by mouth 2 (two) times daily.   Yes Historical Provider, MD  ascorbic acid (VITAMIN C) 1000 MG tablet Take 1,000 mg by mouth daily.   Yes Historical Provider, MD  aspirin EC 81 MG tablet Take 81 mg by mouth daily.   Yes Historical Provider, MD  atorvastatin (LIPITOR) 20 MG tablet Take 20 mg by mouth at bedtime.    Yes Historical Provider, MD  Calcium Carbonate-Vitamin D (CALTRATE 600+D) 600-400 MG-UNIT per tablet Take 1 tablet by mouth daily.     Yes Historical Provider, MD  carvedilol (COREG) 3.125 MG tablet Take 3.125 mg by mouth 2 (two) times daily. 04/06/16 04/06/17 Yes Historical Provider, MD  cetirizine (ZYRTEC) 10 MG chewable tablet Chew 10 mg by mouth daily.     Yes Historical Provider, MD  cloNIDine (CATAPRES) 0.1 MG tablet Take 0.1 mg by mouth 2 (two) times daily.    Yes Historical Provider, MD  docusate sodium (COLACE) 100 MG capsule Take 1 capsule (100 mg total) by mouth daily as needed. 05/03/16 05/03/17 Yes Darel Hong, MD  doxazosin (CARDURA) 8 MG tablet Take 1 tablet (8 mg total) by mouth daily. 02/16/16  Yes Minna Merritts, MD  ferrous sulfate 325 (65 FE) MG tablet Take 325 mg by mouth daily with breakfast.   Yes Historical Provider, MD  fluticasone (FLONASE) 50 MCG/ACT nasal spray Place 2 sprays into both nostrils at bedtime as needed for allergies or rhinitis. 05/25/15  Yes Srikar Sudini, MD  furosemide (LASIX) 20 MG tablet Take 20 mg by mouth every other day. Takes 1 tab on Monday, Wed, and Friday   Yes Historical Provider, MD  gabapentin (NEURONTIN) 300 MG capsule Take 1 capsule (300 mg total) by mouth at bedtime. 11/17/15 05/05/16 Yes Milinda Pointer, MD  Multiple Vitamin (MULTIVITAMIN) tablet Take 1 tablet by mouth daily.   Yes Historical Provider, MD  pantoprazole (PROTONIX) 40 MG tablet Take 40 mg by mouth at bedtime.  02/14/15  Yes Minna Merritts,  MD  Probiotic Product ([place] PO) Take 1 capsule by mouth daily.   Yes Historical Provider, MD  traMADol (ULTRAM) 50 MG tablet Take 1 tablet (50 mg total) by mouth every 6 (six) hours as needed for severe pain. Patient taking differently: Take 50 mg by mouth daily.  10/10/15  Yes Milinda Pointer, MD      VITAL SIGNS:  Blood pressure (!) 206/71, pulse (!) 47, temperature 97.5 F (36.4 C), temperature source Oral, resp. rate 12, height 5\' 4"  (1.626 m), weight 54.4 kg (120 lb), SpO2 98 %.  PHYSICAL EXAMINATION:  GENERAL:  83 y.o.-year-old patient lying in the bed with no acute  distress.  EYES: Pupils equal, round, reactive to light and accommodation. No scleral icterus. Extraocular muscles intact.  HEENT: Head atraumatic, normocephalic. Oropharynx and nasopharynx clear.  NECK:  Supple, no jugular venous distention. No thyroid enlargement, no tenderness.  LUNGS: Moderately diminished coarse bronchial  breath sounds bilaterally, no wheezing, rales,rhonchi or crepitation. No use of accessory muscles of respiration.  CARDIOVASCULAR: S1, S2 normal. No murmurs, rubs, or gallops.  ABDOMEN: Soft, nontender, nondistended. Bowel sounds present. No organomegaly or mass.  EXTREMITIES: No pedal edema, cyanosis, or clubbing.  NEUROLOGIC: Cranial nerves II through XII are intact. Muscle strength 5/5 in all extremities. Sensation intact. Gait not checked.  PSYCHIATRIC: The patient is alert and oriented x 3.  SKIN: No obvious rash, lesion, or ulcer.   LABORATORY PANEL:   CBC  Recent Labs Lab 05/05/16 1203  WBC 7.4  HGB 10.8*  HCT 31.2*  PLT 194   ------------------------------------------------------------------------------------------------------------------  Chemistries   Recent Labs Lab 05/05/16 1203  NA 124*  K 3.9  CL 86*  CO2 28  GLUCOSE 130*  BUN 31*  CREATININE 1.62*  CALCIUM 8.4*    ------------------------------------------------------------------------------------------------------------------  Cardiac Enzymes  Recent Labs Lab 05/05/16 1203  TROPONINI <0.03   ------------------------------------------------------------------------------------------------------------------  RADIOLOGY:  Dg Chest 2 View  Result Date: 05/05/2016 CLINICAL DATA:  Chest pain EXAM: CHEST  2 VIEW COMPARISON:  05/03/2016 chest radiograph. FINDINGS: Stable cardiomediastinal silhouette with top-normal heart size and aortic atherosclerosis. No pneumothorax. New small left pleural effusion. Trace right pleural effusion. Patchy opacities at the left greater than right lower lobes. No pulmonary edema. Cholecystectomy clips are seen in the right upper quadrant of the abdomen. IMPRESSION: 1. Patchy bibasilar lung opacities, left greater than right, suggesting multilobar pneumonia/ aspiration. Recommend follow-up chest imaging to resolution . 2. New small left and trace right pleural effusions. 3. Aortic atherosclerosis. Electronically Signed   By: Ilona Sorrel M.D.   On: 05/05/2016 14:06   Dg Abd Acute W/chest  Result Date: 05/03/2016 CLINICAL DATA:  83 y/o  F; constipation and abdominal pain. EXAM: DG ABDOMEN ACUTE W/ 1V CHEST COMPARISON:  10/14/2015 chest radiograph FINDINGS: Clear lungs. Aortic atherosclerosis with calcification. Stable normal cardiac silhouette given projection and technique. Multilevel degenerative changes of the spine with reverse S thoracolumbar curvature. Normal bowel gas pattern. Large volume of stool throughout the colon. Right pelvic phleboliths. Right upper quadrant surgical clips, presumably cholecystectomy. Stable mild loss of height of the L2 and L3 vertebral bodies. IMPRESSION: Normal bowel gas pattern. Large volume of stool in the colon. No acute cardiopulmonary process identified. Electronically Signed   By: Kristine Garbe M.D.   On: 05/03/2016 19:15    EKG:    Orders placed or performed during the hospital encounter of 05/05/16  . EKG 12-Lead  . EKG 12-Lead  . ED EKG within 10 minutes  . ED EKG within 10 minutes    IMPRESSION AND PLAN:  Joanne Michael  is a 83 y.o. female with a known history of Stage IV chronic kidney disease sees Dr. Candiss Norse as an outpatient, uncontrolled hypertension, GERD, hyperlipidemia and multiple other medical problems is presenting to the ED with a chief complaint of shortness of breath associated with chest pain and rib pain when she is coughing. Patient is feeling weak and shortness of breath is progressively getting worse for the past week. Patient is bringing up whitish yellow phlegm and came into the emergency department chest x-ray has revealed left greater than right-sided infiltrate with some pleural effusion   #Chest  pain or shortness of breath secondary to community acquired pneumonia Admit to MedSurg unit Follow up on the sputum culture and sensitivity IV Rocephin and azithromycin Albuterol as needed Continue home medication tramadol as needed  #Hyponatremia sodium at 124 Patient is mentating fine Could be from poor by mouth intake with underlying pneumonia Check serum muscularity, urine random sodium and urine osmolality Serial sodiums IV fluids  #Hypertensive urgency Patient has chronic kidney disease stage IV and history of uncontrolled high blood pressure Continue Coreg, clonidine, Cardura Adding hydralazine to the regimen Nephrology consult is placed to Dr. Candiss Norse Patient might be benefited with CT abdomen to rule out renal artery stenosis if no improvement in blood pressure  #Hyperlipidemia Check fasting lipid panel   DVT prophylaxis with Lovenox dose adjusted to renal insufficiency  All the records are reviewed and case discussed with ED provider. Management plans discussed with the patient, family and they are in agreement.  CODE STATUS: Full code, daughter is the healthcare power of  attorney  TOTAL TIME TAKING CARE OF THIS PATIENT: 45 minutes.   Note: This dictation was prepared with Dragon dictation along with smaller phrase technology. Any transcriptional errors that result from this process are unintentional.  Nicholes Mango M.D on 05/05/2016 at 3:31 PM  Between 7am to 6pm - Pager - [phone]  After 6pm go to [email or web address] - password [email or web address] [facility]  Office  [phone]  CC: Primary care physician; Dion Body, MD

## 2016-05-05 NOTE — ED Notes (Signed)
Pt assisted to bathroom to void

## 2016-05-06 LAB — LIPID PANEL
Cholesterol: 118 mg/dL (ref 0–200)
HDL: 46 mg/dL (ref >40)
LDL CALC: 54 mg/dL (ref 0–99)
Total CHOL/HDL Ratio: 2.6 RATIO
Triglycerides: 89 mg/dL (ref <150)
VLDL: 18 mg/dL (ref 0–40)

## 2016-05-06 LAB — STREP PNEUMONIAE URINARY ANTIGEN: Strep Pneumo Urinary Antigen: NEGATIVE

## 2016-05-06 LAB — SODIUM: Sodium: 125 mmol/L — ABNORMAL LOW (ref 135–145)

## 2016-05-06 MED ORDER — CARVEDILOL 6.25 MG PO TABS
6.2500 mg | ORAL_TABLET | Freq: Two times a day (BID) | ORAL | Status: DC
  Administered 2016-05-06 – 2016-05-09 (×6): 6.25 mg via ORAL
  Filled 2016-05-06 (×6): qty 1

## 2016-05-06 MED ORDER — SODIUM CHLORIDE 0.9 % IV SOLN
INTRAVENOUS | Status: AC
  Administered 2016-05-06: 12:00:00 via INTRAVENOUS

## 2016-05-06 NOTE — Progress Notes (Signed)
Subjective:   Patient known to our practice from outpatient. She is followed for chronic kidney disease. Last seen on January 25. At that time, her creatinine was 1.97, sodium was 140 She is admitted for shortness of breath and lower right chest pain. She is diagnosed with pneumonia and is currently being treated with IV antibiotics. Incidentally, she was noted to have hyponatremia with sodium level of 125 Serum osmolality is 270  Objective:  Vital signs in last 24 hours:  Temp:  [97.5 F (36.4 C)-98.4 F (36.9 C)] 98.4 F (36.9 C) (03/11 0272) Pulse Rate:  [46-83] 64 (03/11 1120) Resp:  [11-16] 16 (03/11 0638) BP: (162-225)/(47-80) 162/49 (03/11 1120) SpO2:  [95 %-100 %] 98 % (03/11 5366) Weight:  [54.1 kg (119 lb 3.2 oz)] 54.1 kg (119 lb 3.2 oz) (03/10 1802)  Weight change:  Filed Weights   05/05/16 1205 05/05/16 1802  Weight: 54.4 kg (120 lb) 54.1 kg (119 lb 3.2 oz)    Intake/Output:    Intake/Output Summary (Last 24 hours) at 05/06/16 1215 Last data filed at 05/06/16 0000  Gross per 24 hour  Intake             1470 ml  Output                0 ml  Net             1470 ml     Physical Exam: General: Elderly woman, lying in the bed   HEENT Anicteric, moist oral mucous membranes   Neck Supple   Pulm/lungs Right basilar crackles   CVS/Heart Is regular rhythm, prominent systolic murmur   Abdomen:  Soft, nontender   Extremities: No edema   Neurologic: Alert, oriented   Skin: Normal turgor           Basic Metabolic Panel:   Recent Labs Lab 05/05/16 1203 05/05/16 1802 05/05/16 2118 05/06/16 0332  NA 124* 125* 125* 125*  K 3.9  --   --   --   CL 86*  --   --   --   CO2 28  --   --   --   GLUCOSE 130*  --   --   --   BUN 31*  --   --   --   CREATININE 1.62*  --   --   --   CALCIUM 8.4*  --   --   --      CBC:  Recent Labs Lab 05/05/16 1203  WBC 7.4  HGB 10.8*  HCT 31.2*  MCV 89.1  PLT 194      Microbiology:  Recent Results (from the  past 720 hour(s))  Culture, blood (routine x 2)     Status: None (Preliminary result)   Collection Time: 05/05/16  2:53 PM  Result Value Ref Range Status   Specimen Description BLOOD  RT HAND  Final   Special Requests BOTTLES DRAWN AEROBIC AND ANAEROBIC  BCHV  Final   Culture NO GROWTH < 24 HOURS  Final   Report Status PENDING  Incomplete  Culture, blood (routine x 2)     Status: None (Preliminary result)   Collection Time: 05/05/16  2:58 PM  Result Value Ref Range Status   Specimen Description BLOOD  RT AC  Final   Special Requests BOTTLES DRAWN AEROBIC AND ANAEROBIC  [place]  Final   Culture NO GROWTH < 24 HOURS  Final   Report Status PENDING  Incomplete    Coagulation  Studies: No results for input(s): LABPROT, INR in the last 72 hours.  Urinalysis:  Recent Labs  05/03/16 1930 05/05/16 1631  COLORURINE YELLOW* STRAW*  LABSPEC 1.003* 1.002*  PHURINE 7.0 7.0  GLUCOSEU NEGATIVE NEGATIVE  HGBUR NEGATIVE NEGATIVE  BILIRUBINUR NEGATIVE NEGATIVE  KETONESUR NEGATIVE NEGATIVE  PROTEINUR 100* 100*  NITRITE NEGATIVE NEGATIVE  LEUKOCYTESUR NEGATIVE TRACE*      Imaging: Dg Chest 2 View  Result Date: 05/05/2016 CLINICAL DATA:  Chest pain EXAM: CHEST  2 VIEW COMPARISON:  05/03/2016 chest radiograph. FINDINGS: Stable cardiomediastinal silhouette with top-normal heart size and aortic atherosclerosis. No pneumothorax. New small left pleural effusion. Trace right pleural effusion. Patchy opacities at the left greater than right lower lobes. No pulmonary edema. Cholecystectomy clips are seen in the right upper quadrant of the abdomen. IMPRESSION: 1. Patchy bibasilar lung opacities, left greater than right, suggesting multilobar pneumonia/ aspiration. Recommend follow-up chest imaging to resolution . 2. New small left and trace right pleural effusions. 3. Aortic atherosclerosis. Electronically Signed   By: Ilona Sorrel M.D.   On: 05/05/2016 14:06     Medications:   . sodium chloride 50  mL/hr at 05/06/16 1151   . allopurinol  100 mg Oral BID  . aspirin EC  81 mg Oral Daily  . atorvastatin  20 mg Oral QHS  . azithromycin  500 mg Oral Daily  . calcium-vitamin D  1 tablet Oral Daily  . carvedilol  6.25 mg Oral BID  . cefTRIAXone (ROCEPHIN)  IV  1 g Intravenous Q24H  . cloNIDine  0.1 mg Oral BID  . doxazosin  8 mg Oral Daily  . enoxaparin (LOVENOX) injection  30 mg Subcutaneous Q24H  . ferrous sulfate  325 mg Oral Q breakfast  . [START ON 05/07/2016] furosemide  20 mg Oral Q M,W,F  . gabapentin  300 mg Oral QHS  . hydrALAZINE  25 mg Oral Q8H  . loratadine  10 mg Oral Daily  . multivitamin with minerals  1 tablet Oral Daily  . pantoprazole  40 mg Oral QHS  . ascorbic acid  1,000 mg Oral Daily   albuterol, docusate sodium, fluticasone, hydrALAZINE, traMADol  Assessment/ Plan:  83 y.o. female 83 year old Caucasian female with hypertension, degenerative joint disease, chronic kidney disease, gout, C. difficile colitis in 2014, history of acute renal failure and GERD is admitted for pneumonia  1. Chronic kidney disease stage IV. Serum creatinine is lower than baseline. Currently 1.62/GFR 27 2. Hyponatremia Likely "tea and toast diet". Urine specific gravity 1.002 and urine osmolality of 100  indicates dilute urine Hopefully with correct with water restriction and normal diet 3. Isolated systolic hypertension Patient was scheduled for renal artery duplex as outpatient but is now admitted with pneumonia Discussed with vascular. We make her nothing by mouth for possible angiogram tomorrow    LOS: 1 Joanne Michael 3/11/201812:15 PM

## 2016-05-06 NOTE — Progress Notes (Addendum)
[facility] [place] NAME: Joanne Michael    MR#:  [id number]  DATE OF BIRTH:  [birth year]  SUBJECTIVE:  Came in with sob and cough Elevated BP  REVIEW OF SYSTEMS:   Review of Systems  Constitutional: Negative for chills, fever and weight loss.  HENT: Negative for ear discharge, ear pain and nosebleeds.   Eyes: Negative for blurred vision, pain and discharge.  Respiratory: Positive for cough and shortness of breath. Negative for sputum production, wheezing and stridor.   Cardiovascular: Negative for chest pain, palpitations, orthopnea and PND.  Gastrointestinal: Negative for abdominal pain, diarrhea, nausea and vomiting.  Genitourinary: Negative for frequency and urgency.  Musculoskeletal: Negative for back pain and joint pain.  Neurological: Positive for weakness. Negative for sensory change, speech change and focal weakness.  Psychiatric/Behavioral: Negative for depression and hallucinations. The patient is not nervous/anxious.    Tolerating Diet:yes Tolerating PT: pending  DRUG ALLERGIES:   Allergies  Allergen Reactions  . Oxycodone Itching  . Codeine Nausea And Vomiting and Itching  . Hydrocodone-Acetaminophen Other (See Comments)    Hallucinates  . Iodine Swelling, Other (See Comments), Itching and Nausea Only    Patient is allergic to seafood and this is the cause why she can't take medication.  . Meperidine Hcl     Unknown reactions.  . Other Swelling    Seafood- tongue swelling and discoloration Patient allergic to seafood, tongue swelling and discoloration.  . Propoxyphene N-Acetaminophen Other (See Comments)    Unknown reaction    VITALS:  Blood pressure (!) 174/47, pulse 61, temperature 98.3 F (36.8 C), temperature source Oral, resp. rate 18, height 4\' 11"  (1.499 m), weight 54.1 kg (119 lb 3.2 oz), SpO2 98 %.  PHYSICAL EXAMINATION:   Physical Exam  GENERAL:  83 y.o.-year-old patient lying in the bed  with no acute distress.  EYES: Pupils equal, round, reactive to light and accommodation. No scleral icterus. Extraocular muscles intact.  HEENT: Head atraumatic, normocephalic. Oropharynx and nasopharynx clear.  NECK:  Supple, no jugular venous distention. No thyroid enlargement, no tenderness.  LUNGS: Normal breath sounds bilaterally, no wheezing, rales, rhonchi. No use of accessory muscles of respiration.  CARDIOVASCULAR: S1, S2 normal. No murmurs, rubs, or gallops.  ABDOMEN: Soft, nontender, nondistended. Bowel sounds present. No organomegaly or mass.  EXTREMITIES: No cyanosis, clubbing or edema b/l.    NEUROLOGIC: Cranial nerves II through XII are intact. No focal Motor or sensory deficits b/l.   PSYCHIATRIC:  patient is alert and oriented x 3.  SKIN: No obvious rash, lesion, or ulcer.   LABORATORY PANEL:  CBC  Recent Labs Lab 05/05/16 1203  WBC 7.4  HGB 10.8*  HCT 31.2*  PLT 194    Chemistries   Recent Labs Lab 05/05/16 1203  05/06/16 0332  NA 124*  < > 125*  K 3.9  --   --   CL 86*  --   --   CO2 28  --   --   GLUCOSE 130*  --   --   BUN 31*  --   --   CREATININE 1.62*  --   --   CALCIUM 8.4*  --   --   < > = values in this interval not displayed. Cardiac Enzymes  Recent Labs Lab 05/05/16 1203  TROPONINI <0.03   RADIOLOGY:  Dg Chest 2 View  Result Date: 05/05/2016 CLINICAL DATA:  Chest pain EXAM: CHEST  2 VIEW COMPARISON:  05/03/2016  chest radiograph. FINDINGS: Stable cardiomediastinal silhouette with top-normal heart size and aortic atherosclerosis. No pneumothorax. New small left pleural effusion. Trace right pleural effusion. Patchy opacities at the left greater than right lower lobes. No pulmonary edema. Cholecystectomy clips are seen in the right upper quadrant of the abdomen. IMPRESSION: 1. Patchy bibasilar lung opacities, left greater than right, suggesting multilobar pneumonia/ aspiration. Recommend follow-up chest imaging to resolution . 2. New small  left and trace right pleural effusions. 3. Aortic atherosclerosis. Electronically Signed   By: Ilona Sorrel M.D.   On: 05/05/2016 14:06   ASSESSMENT AND PLAN:  Joanne Michael  is a 83 y.o. female with a known history of Stage IV chronic kidney disease sees Dr. Candiss Norse as an outpatient, uncontrolled hypertension, GERD, hyperlipidemia and multiple other medical problems is presenting to the ED with a chief complaint of shortness of breath associated with chest pain and rib pain when she is coughing. Patient is feeling weak and shortness of breath is progressively getting worse for the past week. Patient is bringing up whitish yellow phlegm and came into the emergency department chest x-ray has revealed left greater than right-sided infiltrate with some pleural effusion   #Chest pain or shortness of breath secondary to community acquired pneumonia Follow up on the sputum culture and sensitivity IV Rocephin and azithromycin Albuterol as needed Continue home medication tramadol as needed  #Hyponatremia sodium at 124 Patient is mentating fine Could be from poor by mouth intake with underlying pneumonia 124--125---125 IV fluids  #Hypertensive urgency Patient has chronic kidney disease stage IV and history of uncontrolled high blood pressure Continue Coreg, clonidine, Cardura Adding hydralazine to the regimen Nephrology consult is placed to Dr. Candiss Norse ?renal angiogram  #Hyperlipidemia Check fasting lipid panel   DVT prophylaxis with Lovenox dose adjusted to renal insufficiency   Case discussed with Care Management/Social Worker. Management plans discussed with the patient, family and they are in agreement.  CODE STATUS:full  DVT Prophylaxis: lovenox TOTAL TIME TAKING CARE OF THIS PATIENT: 30 minutes.  >50% time spent on counselling and coordination of care  POSSIBLE D/C IN 1-2 DAYS, DEPENDING ON CLINICAL CONDITION.  Note: This dictation was prepared with Dragon dictation along  with smaller phrase technology. Any transcriptional errors that result from this process are unintentional.  Joanne Michael M.D on 05/06/2016 at 2:50 PM  Between 7am to 6pm - Pager - [phone]  After 6pm go to [email or web address] - password [email or web address] [facility]  Office  [phone]  CC: Primary care physician; Dion Body, MD

## 2016-05-07 ENCOUNTER — Encounter
Admission: EM | Disposition: A | Discharge status: Home Health | Payer: Self-pay | Source: Home / Self Care | Attending: Internal Medicine

## 2016-05-07 DIAGNOSIS — I10 Essential (primary) hypertension: Secondary | ICD-10-CM

## 2016-05-07 DIAGNOSIS — R0602 Shortness of breath: Secondary | ICD-10-CM

## 2016-05-07 DIAGNOSIS — I701 Atherosclerosis of renal artery: Secondary | ICD-10-CM

## 2016-05-07 DIAGNOSIS — N184 Chronic kidney disease, stage 4 (severe): Secondary | ICD-10-CM

## 2016-05-07 HISTORY — PX: RENAL ANGIOGRAPHY: CATH118260

## 2016-05-07 LAB — HIV ANTIBODY (ROUTINE TESTING W REFLEX): HIV SCREEN 4TH GENERATION: NONREACTIVE

## 2016-05-07 SURGERY — RENAL ANGIOGRAPHY
Anesthesia: Moderate Sedation

## 2016-05-07 MED ORDER — CEFAZOLIN IN D5W 1 GM/50ML IV SOLN
1.0000 g | Freq: Once | INTRAVENOUS | Status: AC
  Administered 2016-05-07: 1 g via INTRAVENOUS

## 2016-05-07 MED ORDER — DIPHENHYDRAMINE HCL 50 MG/ML IJ SOLN
25.0000 mg | Freq: Once | INTRAMUSCULAR | Status: AC
  Administered 2016-05-07: 25 mg via INTRAVENOUS

## 2016-05-07 MED ORDER — MANNITOL 20% IV SOLUTION 10G/50ML
12.0000 g | Freq: Once | INTRAVENOUS | Status: AC
  Administered 2016-05-07: 12 g via INTRAVENOUS
  Filled 2016-05-07: qty 60

## 2016-05-07 MED ORDER — MIDAZOLAM HCL 5 MG/5ML IJ SOLN
INTRAMUSCULAR | Status: AC
  Filled 2016-05-07: qty 5

## 2016-05-07 MED ORDER — HEPARIN SODIUM (PORCINE) 1000 UNIT/ML IJ SOLN
INTRAMUSCULAR | Status: AC
  Filled 2016-05-07: qty 1

## 2016-05-07 MED ORDER — FENTANYL CITRATE (PF) 100 MCG/2ML IJ SOLN
INTRAMUSCULAR | Status: DC | PRN
  Administered 2016-05-07: 25 ug via INTRAVENOUS

## 2016-05-07 MED ORDER — METHYLPREDNISOLONE SODIUM SUCC 125 MG IJ SOLR
INTRAMUSCULAR | Status: AC
  Filled 2016-05-07: qty 2

## 2016-05-07 MED ORDER — METHYLPREDNISOLONE SODIUM SUCC 125 MG IJ SOLR
125.0000 mg | Freq: Once | INTRAMUSCULAR | Status: AC
Start: 2016-05-07 — End: 2016-05-07
  Administered 2016-05-07: 125 mg via INTRAVENOUS

## 2016-05-07 MED ORDER — CEFAZOLIN IN D5W 1 GM/50ML IV SOLN
INTRAVENOUS | Status: AC
  Filled 2016-05-07: qty 50

## 2016-05-07 MED ORDER — CLOPIDOGREL BISULFATE 75 MG PO TABS
75.0000 mg | ORAL_TABLET | Freq: Every day | ORAL | Status: DC
  Administered 2016-05-07 – 2016-05-09 (×3): 75 mg via ORAL
  Filled 2016-05-07 (×3): qty 1

## 2016-05-07 MED ORDER — SODIUM CHLORIDE 0.9 % IV SOLN
INTRAVENOUS | Status: DC
  Administered 2016-05-07: 800 mL via INTRAVENOUS
  Administered 2016-05-08: 02:00:00 via INTRAVENOUS

## 2016-05-07 MED ORDER — HEPARIN (PORCINE) IN NACL 2-0.9 UNIT/ML-% IJ SOLN
INTRAMUSCULAR | Status: AC
  Filled 2016-05-07: qty 1000

## 2016-05-07 MED ORDER — HEPARIN SODIUM (PORCINE) 1000 UNIT/ML IJ SOLN
INTRAMUSCULAR | Status: DC | PRN
  Administered 2016-05-07: 1500 [IU] via INTRAVENOUS
  Administered 2016-05-07: 2500 [IU] via INTRAVENOUS

## 2016-05-07 MED ORDER — DIPHENHYDRAMINE HCL 50 MG/ML IJ SOLN
INTRAMUSCULAR | Status: AC
  Filled 2016-05-07: qty 1

## 2016-05-07 MED ORDER — IOPAMIDOL (ISOVUE-300) INJECTION 61%
INTRAVENOUS | Status: DC | PRN
  Administered 2016-05-07: 50 mL via INTRAVENOUS

## 2016-05-07 MED ORDER — FENTANYL CITRATE (PF) 100 MCG/2ML IJ SOLN
INTRAMUSCULAR | Status: AC
  Filled 2016-05-07: qty 2

## 2016-05-07 MED ORDER — MANNITOL 25 % IV SOLN
INTRAVENOUS | Status: AC
  Filled 2016-05-07: qty 50

## 2016-05-07 MED ORDER — LIDOCAINE-EPINEPHRINE (PF) 2 %-1:200000 IJ SOLN
INTRAMUSCULAR | Status: AC
  Filled 2016-05-07: qty 20

## 2016-05-07 MED ORDER — ORAL CARE MOUTH RINSE
15.0000 mL | Freq: Two times a day (BID) | OROMUCOSAL | Status: DC
  Administered 2016-05-07 – 2016-05-08 (×3): 15 mL via OROMUCOSAL

## 2016-05-07 MED ORDER — MIDAZOLAM HCL 2 MG/2ML IJ SOLN
INTRAMUSCULAR | Status: DC | PRN
  Administered 2016-05-07: 1 mg via INTRAVENOUS

## 2016-05-07 SURGICAL SUPPLY — 22 items
BALLN VIATRAC 4X20X135 (BALLOONS) ×3
BALLOON VIATRAC 4X20X135 (BALLOONS) ×1 IMPLANT
CATH 5FR REUT (CATHETERS) ×3 IMPLANT
CATH C2 65CM (CATHETERS) ×3 IMPLANT
CATH CXI 4F 90 DAV (CATHETERS) ×3 IMPLANT
CATH LIMA 6F (CATHETERS) ×3 IMPLANT
CATH PIG 70CM (CATHETERS) ×3 IMPLANT
DEVICE PRESTO INFLATION (MISCELLANEOUS) ×3 IMPLANT
DEVICE SAFEGUARD 24CM (GAUZE/BANDAGES/DRESSINGS) ×3 IMPLANT
DEVICE STARCLOSE SE CLOSURE (Vascular Products) ×3 IMPLANT
DEVICE TORQUE (MISCELLANEOUS) ×3 IMPLANT
GLIDECATH 4FR STR (CATHETERS) ×3 IMPLANT
GLIDEWIRE STIFF .35X180X3 HYDR (WIRE) ×3 IMPLANT
PACK ANGIOGRAPHY (CUSTOM PROCEDURE TRAY) ×3 IMPLANT
SHEATH BRITE TIP 5FRX11 (SHEATH) ×3 IMPLANT
SHEATH BRITE TIP 6FRX11 (SHEATH) ×3 IMPLANT
STENT HERCULINK RX 5.5X18X135 (Permanent Stent) ×3 IMPLANT
TOWEL OR 17X26 4PK STRL BLUE (TOWEL DISPOSABLE) ×3 IMPLANT
TUBING CONTRAST HIGH PRESS 72 (TUBING) ×3 IMPLANT
VALVE HEMO TOUHY BORST Y (VALVE) ×3 IMPLANT
WIRE J 3MM .035X145CM (WIRE) ×3 IMPLANT
WIRE SPARTACORE .014X190CM (WIRE) ×3 IMPLANT

## 2016-05-07 NOTE — Progress Notes (Signed)
Subjective:  Patient underwent renal angiogram today. Right renal artery stent was placed. Blood pressure immediately after procedure was 138/51.   Objective:  Vital signs in last 24 hours:  Temp:  [97.5 F (36.4 C)-98.4 F (36.9 C)] 97.7 F (36.5 C) (03/12 1336) Pulse Rate:  [58-74] 74 (03/12 1336) Resp:  [11-20] 20 (03/12 1336) BP: (138-212)/(43-65) 185/44 (03/12 1336) SpO2:  [94 %-99 %] 97 % (03/12 1336)  Weight change:  Filed Weights   05/05/16 1205 05/05/16 1802  Weight: 54.4 kg (120 lb) 54.1 kg (119 lb 3.2 oz)    Intake/Output:    Intake/Output Summary (Last 24 hours) at 05/07/16 1424 Last data filed at 05/07/16 1338  Gross per 24 hour  Intake            627.5 ml  Output             1400 ml  Net           -772.5 ml     Physical Exam: General: Resting in bed   HEENT Anicteric, moist oral mucous membranes   Neck Supple   Pulm/lungs Right basilar crackles   CVS/Heart regular rhythm, prominent systolic murmur   Abdomen:  Soft, nontender   Extremities: No edema   Neurologic: Alert, oriented   Skin: Normal turgor           Basic Metabolic Panel:   Recent Labs Lab 05/05/16 1203 05/05/16 1802 05/05/16 2118 05/06/16 0332  NA 124* 125* 125* 125*  K 3.9  --   --   --   CL 86*  --   --   --   CO2 28  --   --   --   GLUCOSE 130*  --   --   --   BUN 31*  --   --   --   CREATININE 1.62*  --   --   --   CALCIUM 8.4*  --   --   --      CBC:  Recent Labs Lab 05/05/16 1203  WBC 7.4  HGB 10.8*  HCT 31.2*  MCV 89.1  PLT 194      Microbiology:  Recent Results (from the past 720 hour(s))  Culture, blood (routine x 2)     Status: None (Preliminary result)   Collection Time: 05/05/16  2:53 PM  Result Value Ref Range Status   Specimen Description BLOOD  RT HAND  Final   Special Requests BOTTLES DRAWN AEROBIC AND ANAEROBIC  Sallis  Final   Culture NO GROWTH 2 DAYS  Final   Report Status PENDING  Incomplete  Culture, blood (routine x 2)      Status: None (Preliminary result)   Collection Time: 05/05/16  2:58 PM  Result Value Ref Range Status   Specimen Description BLOOD  RT AC  Final   Special Requests BOTTLES DRAWN AEROBIC AND ANAEROBIC  BCHV  Final   Culture NO GROWTH 2 DAYS  Final   Report Status PENDING  Incomplete    Coagulation Studies: No results for input(s): LABPROT, INR in the last 72 hours.  Urinalysis:  Recent Labs  05/05/16 1631  COLORURINE STRAW*  LABSPEC 1.002*  PHURINE 7.0  GLUCOSEU NEGATIVE  HGBUR NEGATIVE  BILIRUBINUR NEGATIVE  KETONESUR NEGATIVE  PROTEINUR 100*  NITRITE NEGATIVE  LEUKOCYTESUR TRACE*      Imaging: No results found.   Medications:    . allopurinol  100 mg Oral BID  . aspirin EC  81 mg Oral  Daily  . atorvastatin  20 mg Oral QHS  . azithromycin  500 mg Oral Daily  . calcium-vitamin D  1 tablet Oral Daily  . carvedilol  6.25 mg Oral BID  . cefTRIAXone (ROCEPHIN)  IV  1 g Intravenous Q24H  . cloNIDine  0.1 mg Oral BID  . clopidogrel  75 mg Oral Daily  . doxazosin  8 mg Oral Daily  . enoxaparin (LOVENOX) injection  30 mg Subcutaneous Q24H  . ferrous sulfate  325 mg Oral Q breakfast  . furosemide  20 mg Oral Q M,W,F  . gabapentin  300 mg Oral QHS  . hydrALAZINE  25 mg Oral Q8H  . loratadine  10 mg Oral Daily  . mouth rinse  15 mL Mouth Rinse BID  . multivitamin with minerals  1 tablet Oral Daily  . pantoprazole  40 mg Oral QHS  . ascorbic acid  1,000 mg Oral Daily   albuterol, docusate sodium, fluticasone, hydrALAZINE, traMADol  Assessment/ Plan:  83 y.o. female 83 year old Caucasian female with hypertension, degenerative joint disease, chronic kidney disease, gout, C. difficile colitis in 2014, history of acute renal failure and GERD is admitted for pneumonia  1. Chronic kidney disease stage IV. -  Patient received contrast as part of today's renal angiogram. We will need to monitor renal function closely over the next several days. Start the patient on 0.9  normal saline at 75 cc per hour.   2. Hyponatremia Likely "tea and toast diet". Urine specific gravity 1.002 and urine osmolality of 100  indicates dilute urine -  Patient started on IV fluid hydration with 0.9 normal saline as above. Continue to monitor serum sodium.  3. Right renal artery stenosis. Patient underwent right renal artery stent placement today.  Continue the patient on carvedilol, clonidine, doxazosin, hydralazine for now. We may be able to wean some of these off.    LOS: 2 Audyn Dimercurio 3/12/20182:23 PM

## 2016-05-07 NOTE — Care Management Important Message (Signed)
Important Message  Patient Details  Name: Joanne Michael MRN: [id number] Date of Birth: 1924-03-24   Medicare Important Message Given:  Yes    Beverly Sessions, RN 05/07/2016, 12:31 PM

## 2016-05-07 NOTE — OR Nursing (Signed)
PAD in place with 40 cc air, to remain in place till 4 hours post procedure....13:30 per Dr Lucky Cowboy

## 2016-05-07 NOTE — Op Note (Signed)
[facility] Percutaneous Study/Intervention Procedural Note    Surgeon(s): [employer]  Assistants: None  Pre-operative Diagnosis: Possible renal artery stenosis, renovascular hypertension, stage IV chronic kidney disease  Post-operative diagnosis: Same with high-grade near occlusive right renal artery stenosis and mild left renal artery stenosis  Procedure(s) Performed: 1. Ultrasound guidance for vascular access right femoral artery 2. Catheter placement into right renal artery and left renal artery from right femoral approach 3. Aortogram and selective bilateral renal angiograms  4.  Percutaneous transluminal angioplasty of the right proximal renal artery with 4 mm diameter by 2 cm length angioplasty balloon 5. Balloon expandable stent placement to the right renal artery with a 5.5 mm diameter x 18 mm length stent  6. StarClose closure device right femoral artery  Contrast: 50 cc  EBL: 20 cc   Fluoro Time: 14.9 minutes  Moderate conscious sedation: Approximately 45 minutes with 1 mg of Versed and 25 mcg of Fentanyl  Indications: The patient is a 83 year old female with worsening severe hypertension despite 4 medications. Her renal function is also poor.. The patient has suboptimal blood pressure control despite multiple antihypertensives and likely ischemic nephropathy. Given the clinical scenario and the noninvasive findings, angiogram is indicated for further evaluation of her renal artery and potential treatment. Risks and benefits are discussed and informed consent is obtained.  Procedure: The patient was identified and appropriate procedural time out was performed. The patient was then placed supine on the table and prepped and draped in the usual sterile fashion.Moderate conscious sedation was administered with a face to face encounter with the patient throughout the  procedure with my supervision of the RN administering medicines and monitoring the patients vital signs and mental status throughout from the start of the procedure until the patient was taken to the recovery room  Ultrasound was used to evaluate the right common femoral artery. It was patent . A digital ultrasound image was acquired. A Seldinger needle was used to access the right common femoral artery under direct ultrasound guidance and a permanent image was performed. A 0.035 J wire was advanced without resistance and a 5Fr sheath was placed. Pigtail catheter was placed into the aorta at the L1 level and an AP aortogram was performed. This demonstrated the aorta was markedly tortuous and calcific but nonstenotic. Iliac arteries were also tortuous but not stenotic. Left renal artery was mildly stenotic and about 30-40%. The artery was small to medium in size but a good nephrogram was seen. The right renal artery had a very high-grade stenosis in the 95% range with a very calcific lesion. The right kidney was far lower than the typical location, and the main right renal artery was quite long and downgoing. The patient was then systemically heparinized with 4000 units of intravenous heparin and 12-1/2 g of mannitol were given. The left renal artery was not particularly well seen on the original aortogram, and given the fact that no noninvasive study had been performed previously, I felt a better knowledge of how stenotic this would be would be helpful going forward. The VS 1 catheter was used to selectively cannulate the left renal artery and selective images confirmed a 30-40% stenosis of the proximal left renal artery with a good nephrogram and no other stenosis distally. I then turned my attention to the highly stenotic right renal artery. I used a LIMA guide catheter to cannulate the right renal artery and selective imaging was performed. This confirmed a very high-grade near occlusive stenosis of  the  right renal artery.  At this point I selected the 0.014 Spartacore wire and crossed the lesion without difficulty.  This was an extremely calcific lesion and I elected to predilate this is I did not think the stent wouldn't easily track. A 4 mm diameter by 2 cm length low-profile angioplasty balloon was then used to treat the proximal right renal artery stenosis. This was inflated to 12 atm for 1 minute. Completion angiogram still confirmed a high-grade residual stenosis which was highly calcific and clearly needed a stent. I then selected a 5.5 mm diameter x 18 mm length balloon expandable stent and brought this across the lesion.  This was deployed encompassing the lesion with its proximal extent going back into the aorta for a mm or two.  This was inflated to 12 ATM and the waist resolved.  Completion angiogram showed brisk flow through the stent and what appeared to be a less than 10% residual stenosis with a good nephrogram distally.  The guide catheter was removed. Oblique arteriogram was performed of the right femoral artery and StarClose closure device was deployed in the usual fashion with excellent hemostatic result. The patient was taken to the recovery room in stable condition having tolerated the procedure well.  Findings:  Aortogram/Renal Arteries:Aorta was markedly tortuous and calcific nonstenotic. Iliac arteries were also tortuous but not stenotic. Left renal artery was mildly stenotic and about 30-40%. The artery was small to medium in size but a good nephrogram was seen. The right renal artery had a very high-grade stenosis in the 95% range with a very calcific lesion. The right kidney was far lower than the typical location, and the main right renal artery was quite long and downgoing.   Condition:  Stable  Complications: None   Leotis Pain 05/07/2016 10:34 AM  This note was created with Dragon Medical transcription system. Any errors in dictation are purely  unintentional.

## 2016-05-07 NOTE — Progress Notes (Signed)
[place] [facility] [place] NAME: Joanne Michael    MR#:  [id number]  DATE OF BIRTH:  11/09/1924  SUBJECTIVE:  Came in with sob and cough Resting after angiogram. bp improved after RA stent placement. dter in the room  REVIEW OF SYSTEMS:   Review of Systems  Constitutional: Negative for chills, fever and weight loss.  HENT: Negative for ear discharge, ear pain and nosebleeds.   Eyes: Negative for blurred vision, pain and discharge.  Respiratory: Positive for cough and shortness of breath. Negative for sputum production, wheezing and stridor.   Cardiovascular: Negative for chest pain, palpitations, orthopnea and PND.  Gastrointestinal: Negative for abdominal pain, diarrhea, nausea and vomiting.  Genitourinary: Negative for frequency and urgency.  Musculoskeletal: Negative for back pain and joint pain.  Neurological: Positive for weakness. Negative for sensory change, speech change and focal weakness.  Psychiatric/Behavioral: Negative for depression and hallucinations. The patient is not nervous/anxious.    Tolerating Diet:yes Tolerating PT: pending  DRUG ALLERGIES:   Allergies  Allergen Reactions  . Oxycodone Itching  . Codeine Nausea And Vomiting and Itching  . Hydrocodone-Acetaminophen Other (See Comments)    Hallucinates  . Iodine Swelling, Other (See Comments), Itching and Nausea Only    Patient is allergic to seafood and this is the cause why she can't take medication.  . Meperidine Hcl     Unknown reactions.  . Other Swelling    Seafood- tongue swelling and discoloration Patient allergic to seafood, tongue swelling and discoloration.  . Propoxyphene N-Acetaminophen Other (See Comments)    Unknown reaction    VITALS:  Blood pressure (!) 178/52, pulse 74, temperature 97.7 F (36.5 C), temperature source Oral, resp. rate 20, height 4\' 11"  (1.499 m), weight 54.1 kg (119 lb 3.2 oz), SpO2 97 %.  PHYSICAL EXAMINATION:    Physical Exam  GENERAL:  83 y.o.-year-old patient lying in the bed with no acute distress.  EYES: Pupils equal, round, reactive to light and accommodation. No scleral icterus. Extraocular muscles intact.  HEENT: Head atraumatic, normocephalic. Oropharynx and nasopharynx clear.  NECK:  Supple, no jugular venous distention. No thyroid enlargement, no tenderness.  LUNGS: Normal breath sounds bilaterally, no wheezing, rales, rhonchi. No use of accessory muscles of respiration.  CARDIOVASCULAR: S1, S2 normal. No murmurs, rubs, or gallops.  ABDOMEN: Soft, nontender, nondistended. Bowel sounds present. No organomegaly or mass.  EXTREMITIES: No cyanosis, clubbing or edema b/l.    NEUROLOGIC: Cranial nerves II through XII are intact. No focal Motor or sensory deficits b/l.   PSYCHIATRIC:  patient is alert and oriented x 3.  SKIN: No obvious rash, lesion, or ulcer.   LABORATORY PANEL:  CBC  Recent Labs Lab 05/05/16 1203  WBC 7.4  HGB 10.8*  HCT 31.2*  PLT 194    Chemistries   Recent Labs Lab 05/05/16 1203  05/06/16 0332  NA 124*  < > 125*  K 3.9  --   --   CL 86*  --   --   CO2 28  --   --   GLUCOSE 130*  --   --   BUN 31*  --   --   CREATININE 1.62*  --   --   CALCIUM 8.4*  --   --   < > = values in this interval not displayed. Cardiac Enzymes  Recent Labs Lab 05/05/16 1203  TROPONINI <0.03   RADIOLOGY:  No results found. ASSESSMENT AND PLAN:  Joanne Michael  is a 83 y.o. female with a known history of Stage IV chronic kidney disease sees Dr. Candiss Norse as an outpatient, uncontrolled hypertension, GERD, hyperlipidemia and multiple other medical problems is presenting to the ED with a chief complaint of shortness of breath associated with chest pain and rib pain when she is coughing. Patient is feeling weak and shortness of breath is progressively getting worse for the past week. Patient is bringing up whitish yellow phlegm and came into the emergency department chest x-ray  has revealed left greater than right-sided infiltrate with some pleural effusion   #Chest pain or shortness of breath secondary to community acquired pneumonia Follow up on the sputum culture and sensitivity IV Rocephin and azithromycin Albuterol as needed Continue home medication tramadol as needed  #Hyponatremia sodium at 124 Patient is mentating fine Could be from poor by mouth intake with underlying pneumonia 124--125---125 IV fluids  #Hypertensive urgency Patient has chronic kidney disease stage IV and history of uncontrolled high blood pressure Continue Coreg, clonidine, Cardura Adding hydralazine to the regimen Nephrology consult is placed to Dr. Candiss Norse Pt underwent renal angiogram with placement of renal artery stent today by Dr Lucky Cowboy. BP much improved  #Hyperlipidemia   DVT prophylaxis with Lovenox dose adjusted to renal insufficiency   Case discussed with Care Management/Social Worker. Management plans discussed with the patient, family and they are in agreement.  CODE STATUS:full  DVT Prophylaxis: lovenox TOTAL TIME TAKING CARE OF THIS PATIENT: 30 minutes.  >50% time spent on counselling and coordination of care  POSSIBLE D/C IN 1-2 DAYS, DEPENDING ON CLINICAL CONDITION.  Note: This dictation was prepared with Dragon dictation along with smaller phrase technology. Any transcriptional errors that result from this process are unintentional.  Trenee Igoe M.D on 05/07/2016 at 3:48 PM  Between 7am to 6pm - Pager - [phone]  After 6pm go to [email or web address] - password [email or web address] [facility]  Office  [phone]  CC: Primary care physician; Dion Body, MD

## 2016-05-07 NOTE — Consult Note (Signed)
[facility] Vascular Consult Note  MRN : [id number]  Joanne Michael is a 83 y.o. (Oct 22, 1924) female who presents with chief complaint of  Chief Complaint  Patient presents with  . Chest Pain  .  History of Present Illness: I am asked by Dr. Candiss Norse to see the patient for evaluation for renal artery stenosis. She was originally referred to our office and was scheduled to come for her consult later this week including renal artery duplex. She has poorly controlled hypertension despite 3 medications as an outpatient. She has had additional medication added during the hospitalization. She was admitted with what sounds like pneumonia and has improved with antibiotics. She also has known stage IV chronic kidney disease which is worrisome for ischemic nephropathy. She is hard of hearing and is not a great historian, but denies any problems today. She says her breathing is getting better. She does not have fever or chills currently. She has had no renal artery evaluation or intervention to her knowledge.  Current Facility-Administered Medications  Medication Dose Route Frequency Provider Last Rate Last Dose  . albuterol (PROVENTIL) (2.5 MG/3ML) 0.083% nebulizer solution 2.5 mg  2.5 mg Inhalation Q6H PRN Nicholes Mango, MD   2.5 mg at 05/05/16 2240  . allopurinol (ZYLOPRIM) tablet 100 mg  100 mg Oral BID Nicholes Mango, MD   100 mg at 05/07/16 0849  . aspirin EC tablet 81 mg  81 mg Oral Daily Nicholes Mango, MD   81 mg at 05/07/16 0849  . atorvastatin (LIPITOR) tablet 20 mg  20 mg Oral QHS Nicholes Mango, MD   20 mg at 05/06/16 2219  . azithromycin (ZITHROMAX) tablet 500 mg  500 mg Oral Daily Nicholes Mango, MD   500 mg at 05/07/16 0849  . calcium-vitamin D (OSCAL WITH D) 500-200 MG-UNIT per tablet 1 tablet  1 tablet Oral Daily Nicholes Mango, MD   1 tablet at 05/07/16 0850  . carvedilol (COREG) tablet 6.25 mg  6.25 mg Oral BID Fritzi Mandes, MD   6.25 mg at 05/07/16 0850  . cefTRIAXone (ROCEPHIN)  1 g in dextrose 5 % 50 mL IVPB  1 g Intravenous Q24H Nicholes Mango, MD   1 g at 05/06/16 1730  . cloNIDine (CATAPRES) tablet 0.1 mg  0.1 mg Oral BID Nicholes Mango, MD   0.1 mg at 05/07/16 0849  . docusate sodium (COLACE) capsule 100 mg  100 mg Oral Daily PRN Nicholes Mango, MD   100 mg at 05/06/16 2237  . doxazosin (CARDURA) tablet 8 mg  8 mg Oral Daily Nicholes Mango, MD   8 mg at 05/07/16 0850  . enoxaparin (LOVENOX) injection 30 mg  30 mg Subcutaneous Q24H Nicholes Mango, MD   30 mg at 05/06/16 2219  . ferrous sulfate tablet 325 mg  325 mg Oral Q breakfast Nicholes Mango, MD   325 mg at 05/07/16 0851  . fluticasone (FLONASE) 50 MCG/ACT nasal spray 2 spray  2 spray Each Nare QHS PRN Nicholes Mango, MD      . furosemide (LASIX) tablet 20 mg  20 mg Oral Q M,W,F Aruna Gouru, MD      . gabapentin (NEURONTIN) capsule 300 mg  300 mg Oral QHS Aruna Gouru, MD   300 mg at 05/06/16 2219  . hydrALAZINE (APRESOLINE) injection 10 mg  10 mg Intravenous Q6H PRN Nicholes Mango, MD   10 mg at 05/05/16 1556  . hydrALAZINE (APRESOLINE) tablet 25 mg  25 mg Oral Q8H Nicholes Mango, MD  25 mg at 05/07/16 0630  . loratadine (CLARITIN) tablet 10 mg  10 mg Oral Daily Nicholes Mango, MD   10 mg at 05/07/16 0850  . MEDLINE mouth rinse  15 mL Mouth Rinse BID Fritzi Mandes, MD      . multivitamin with minerals tablet 1 tablet  1 tablet Oral Daily Nicholes Mango, MD   1 tablet at 05/07/16 0850  . pantoprazole (PROTONIX) EC tablet 40 mg  40 mg Oral QHS Nicholes Mango, MD   40 mg at 05/06/16 2219  . traMADol (ULTRAM) tablet 50 mg  50 mg Oral Q6H PRN Nicholes Mango, MD   50 mg at 05/06/16 2219  . vitamin C (ASCORBIC ACID) tablet 1,000 mg  1,000 mg Oral Daily Nicholes Mango, MD   1,000 mg at 05/06/16 5537    Past Medical History:  Diagnosis Date  . Arthritis   . Cancer ([place])    melanoma  . Cat allergies   . Chronic renal failure   . Fatigue   . GERD (gastroesophageal reflux disease)   . Gout   . H/O Clostridium difficile infection   . Hay fever    as child   . Hiatal hernia   . HLD (hyperlipidemia)   . HTN (hypertension)   . Iron deficiency anemia   . Spinal stenosis     Past Surgical History:  Procedure Laterality Date  . APPENDECTOMY    . BACK SURGERY    . CATARACT EXTRACTION    . CHOLECYSTECTOMY    . feet surgery    . HERNIA REPAIR    . knee replacement- both    . MELANOMA EXCISION    . NOSE SURGERY    . ROTATOR CUFF REPAIR     left  . SHOULDER SURGERY    . TONSILLECTOMY    . TOTAL ABDOMINAL HYSTERECTOMY      Social History Social History  Substance Use Topics  . Smoking status: Never Smoker  . Smokeless tobacco: Never Used     Comment: Tobacco use- no   . Alcohol use 1.8 oz/week    3 Glasses of wine per week  No IV drug use  Family History Family History  Problem Relation Age of Onset  . Ovarian cancer Mother   . Liver cancer Father   . Heart failure Father   . Lung cancer Brother   . Prostate cancer Brother   . Lung cancer Sister     Allergies  Allergen Reactions  . Oxycodone Itching  . Codeine Nausea And Vomiting and Itching  . Hydrocodone-Acetaminophen Other (See Comments)    Hallucinates  . Iodine Swelling, Other (See Comments), Itching and Nausea Only    Patient is allergic to seafood and this is the cause why she can't take medication.  . Meperidine Hcl     Unknown reactions.  . Other Swelling    Seafood- tongue swelling and discoloration Patient allergic to seafood, tongue swelling and discoloration.  . Propoxyphene N-Acetaminophen Other (See Comments)    Unknown reaction     REVIEW OF SYSTEMS (Negative unless checked)  Constitutional: [] Weight loss  [] Fever  [] Chills Cardiac: [x] Chest pain   [] Chest pressure   [] Palpitations   [x] Shortness of breath when laying flat   [] Shortness of breath at rest   [x] Shortness of breath with exertion. Vascular:  [] Pain in legs with walking   [] Pain in legs at rest   [] Pain in legs when laying flat   [] Claudication   [] Pain in feet when walking  []   Pain in  feet at rest  [] Pain in feet when laying flat   [] History of DVT   [] Phlebitis   [] Swelling in legs   [] Varicose veins   [] Non-healing ulcers Pulmonary:   [] Uses home oxygen   [x] Productive cough   [] Hemoptysis   [] Wheeze  [] COPD   [] Asthma Neurologic:  [] Dizziness  [] Blackouts   [] Seizures   [] History of stroke   [] History of TIA  [] Aphasia   [] Temporary blindness   [] Dysphagia   [] Weakness or numbness in arms   [] Weakness or numbness in legs Musculoskeletal:  [x] Arthritis   [] Joint swelling   [] Joint pain   [x] Low back pain Hematologic:  [] Easy bruising  [] Easy bleeding   [] Hypercoagulable state   [x] Anemic  [] Hepatitis Gastrointestinal:  [] Blood in stool   [] Vomiting blood  [] Gastroesophageal reflux/heartburn   [] Difficulty swallowing. Genitourinary:  [x] Chronic kidney disease   [] Difficult urination  [] Frequent urination  [] Burning with urination   [] Blood in urine Skin:  [] Rashes   [] Ulcers   [] Wounds Psychological:  [] History of anxiety   []  History of major depression.  Physical Examination  Vitals:   05/06/16 1403 05/06/16 2034 05/07/16 0530 05/07/16 0615  BP: (!) 174/47 (!) 183/62 (!) 191/43 (!) 172/50  Pulse: 61 67 60   Resp:  18 16   Temp:  98.4 F (36.9 C) 98.2 F (36.8 C)   TempSrc:  Oral Oral   SpO2:  99% 98%   Weight:      Height:       Body mass index is 24.08 kg/m. Gen:  WD/WN, NAD. Elderly white female Head: [place]/AT, No temporalis wasting. Ear/Nose/Throat: Hearing grossly intact, nares w/o erythema or drainage, oropharynx w/o Erythema/Exudate Eyes: Sclera non-icteric, conjunctiva clear Neck: Trachea midline.  No JVD.  Pulmonary:  Good air movement, respirations not labored, equal bilaterally.  Cardiac: RRR, normal S1, S2. Vascular:  Vessel Right Left  Radial Palpable Palpable                                   Gastrointestinal: soft, non-tender/non-distended. No guarding/reflex.  Musculoskeletal: M/S 5/5 throughout.  Extremities without ischemic changes.   Marked arthritic changes of her hands are present. Mild lower extremity swelling. Neurologic: Sensation grossly intact in extremities.  Symmetrical.  Speech is fluent. Motor exam as listed above. Psychiatric: Judgment intact, Mood & affect appropriate for pt's clinical situation. Dermatologic: No rashes or ulcers noted.  No cellulitis or open wounds. Lymph : No Cervical, Axillary, or Inguinal lymphadenopathy.      CBC Lab Results  Component Value Date   WBC 7.4 05/05/2016   HGB 10.8 (L) 05/05/2016   HCT 31.2 (L) 05/05/2016   MCV 89.1 05/05/2016   PLT 194 05/05/2016    BMET    Component Value Date/Time   NA 125 (L) 05/06/2016 0332   NA 139 09/26/2013 0519   K 3.9 05/05/2016 1203   K 4.3 09/26/2013 0519   CL 86 (L) 05/05/2016 1203   CL 105 09/26/2013 0519   CO2 28 05/05/2016 1203   CO2 28 09/26/2013 0519   GLUCOSE 130 (H) 05/05/2016 1203   GLUCOSE 115 (H) 09/26/2013 0519   BUN 31 (H) 05/05/2016 1203   BUN 19 (H) 09/26/2013 0519   CREATININE 1.62 (H) 05/05/2016 1203   CREATININE 1.31 (H) 09/26/2013 0519   CALCIUM 8.4 (L) 05/05/2016 1203   CALCIUM 9.1 09/26/2013 0519   GFRNONAA 27 (L) 05/05/2016 1203  GFRNONAA 37 (L) 09/26/2013 0519   GFRAA 31 (L) 05/05/2016 1203   GFRAA 43 (L) 09/26/2013 0519   Estimated Creatinine Clearance: 17.7 mL/min (by C-G formula based on SCr of 1.62 mg/dL (H)).  COAG Lab Results  Component Value Date   INR 0.9 09/23/2013    Radiology Dg Chest 2 View  Result Date: 05/05/2016 CLINICAL DATA:  Chest pain EXAM: CHEST  2 VIEW COMPARISON:  05/03/2016 chest radiograph. FINDINGS: Stable cardiomediastinal silhouette with top-normal heart size and aortic atherosclerosis. No pneumothorax. New small left pleural effusion. Trace right pleural effusion. Patchy opacities at the left greater than right lower lobes. No pulmonary edema. Cholecystectomy clips are seen in the right upper quadrant of the abdomen. IMPRESSION: 1. Patchy bibasilar lung opacities,  left greater than right, suggesting multilobar pneumonia/ aspiration. Recommend follow-up chest imaging to resolution . 2. New small left and trace right pleural effusions. 3. Aortic atherosclerosis. Electronically Signed   By: Ilona Sorrel M.D.   On: 05/05/2016 14:06   Ct Head Wo Contrast  Result Date: 04/16/2016 CLINICAL DATA:  83 year old female tripped over dural way. Nausea and dizziness. Initial encounter. EXAM: CT HEAD WITHOUT CONTRAST CT CERVICAL SPINE WITHOUT CONTRAST TECHNIQUE: Multidetector CT imaging of the head and cervical spine was performed following the standard protocol without intravenous contrast. Multiplanar CT image reconstructions of the cervical spine were also generated. COMPARISON:  10/13/2015. FINDINGS: CT HEAD FINDINGS Brain: No intracranial hemorrhage or CT evidence of large acute infarct. Chronic microvascular changes. Global atrophy without hydrocephalus. No intracranial mass lesion noted on this unenhanced exam. Vascular: Vascular calcifications. Skull: No skull fracture. Sinuses/Orbits: Post lens replacement without acute orbital abnormality. Sinuses which are visualized are clear. Other: Negative CT CERVICAL SPINE FINDINGS Alignment: Mild curvature. Skull base and vertebrae: No cervical spine fracture. Soft tissues and spinal canal: No abnormal prevertebral soft tissue swelling. Disc levels: Cervical spondylotic changes with spinal stenosis and foraminal narrowing most prominent C3-4 thru C6-7. Upper chest: Scarring lung apex. Other: Carotid bifurcation calcifications. IMPRESSION: CT HEAD No skull fracture or intracranial hemorrhage. Chronic microvascular changes. Global atrophy without hydrocephalus. CT CERVICAL SPINE No cervical spine fracture. Cervical spondylotic changes with spinal stenosis and foraminal narrowing most prominent C3-4 thru C6-7. Electronically Signed   By: Genia Del M.D.   On: 04/16/2016 14:47   Ct Cervical Spine Wo Contrast  Result Date:  04/16/2016 CLINICAL DATA:  83 year old female tripped over dural way. Nausea and dizziness. Initial encounter. EXAM: CT HEAD WITHOUT CONTRAST CT CERVICAL SPINE WITHOUT CONTRAST TECHNIQUE: Multidetector CT imaging of the head and cervical spine was performed following the standard protocol without intravenous contrast. Multiplanar CT image reconstructions of the cervical spine were also generated. COMPARISON:  10/13/2015. FINDINGS: CT HEAD FINDINGS Brain: No intracranial hemorrhage or CT evidence of large acute infarct. Chronic microvascular changes. Global atrophy without hydrocephalus. No intracranial mass lesion noted on this unenhanced exam. Vascular: Vascular calcifications. Skull: No skull fracture. Sinuses/Orbits: Post lens replacement without acute orbital abnormality. Sinuses which are visualized are clear. Other: Negative CT CERVICAL SPINE FINDINGS Alignment: Mild curvature. Skull base and vertebrae: No cervical spine fracture. Soft tissues and spinal canal: No abnormal prevertebral soft tissue swelling. Disc levels: Cervical spondylotic changes with spinal stenosis and foraminal narrowing most prominent C3-4 thru C6-7. Upper chest: Scarring lung apex. Other: Carotid bifurcation calcifications. IMPRESSION: CT HEAD No skull fracture or intracranial hemorrhage. Chronic microvascular changes. Global atrophy without hydrocephalus. CT CERVICAL SPINE No cervical spine fracture. Cervical spondylotic changes with spinal stenosis and foraminal narrowing  most prominent C3-4 thru C6-7. Electronically Signed   By: Genia Del M.D.   On: 04/16/2016 14:47   Dg Abd Acute W/chest  Result Date: 05/03/2016 CLINICAL DATA:  83 y/o  F; constipation and abdominal pain. EXAM: DG ABDOMEN ACUTE W/ 1V CHEST COMPARISON:  10/14/2015 chest radiograph FINDINGS: Clear lungs. Aortic atherosclerosis with calcification. Stable normal cardiac silhouette given projection and technique. Multilevel degenerative changes of the spine with  reverse S thoracolumbar curvature. Normal bowel gas pattern. Large volume of stool throughout the colon. Right pelvic phleboliths. Right upper quadrant surgical clips, presumably cholecystectomy. Stable mild loss of height of the L2 and L3 vertebral bodies. IMPRESSION: Normal bowel gas pattern. Large volume of stool in the colon. No acute cardiopulmonary process identified. Electronically Signed   By: Kristine Garbe M.D.   On: 05/03/2016 19:15   Dg Hand Complete Left  Result Date: 04/16/2016 CLINICAL DATA:  Fall, tripped over doorway, left hand third metacarpophalangeal joint EXAM: LEFT HAND - COMPLETE 3+ VIEW COMPARISON:  None. FINDINGS: Three views of the left hand submitted. No acute fracture or subluxation. Narrowing of radiocarpal joint space. Degenerative changes are noticed first carpometacarpal joint. Degenerative changes are noted interphalangeal joint left thumb. Mild degenerative changes proximal interphalangeal joint third fourth and fifth finger. There is soft tissue swelling at the base of third finger. Degenerative changes distal interphalangeal joints second third and fourth finger. IMPRESSION: No acute fracture or subluxation. Multilevel degenerative changes as described above. Soft tissue swelling proximal aspect third finger. Electronically Signed   By: Lahoma Crocker M.D.   On: 04/16/2016 14:38      Assessment/Plan 1. Severe, poorly controlled hypertension with chronic kidney disease stage IV. Clinically worrisome for ischemic nephropathy and renal artery stenosis. Have discussed the case with nephrology who has requested the consult and would like a renal artery evaluation. At this point, she has been hospitalized and is well hydrated as possible for a renal angiogram with a limited amount of contrast. Renal artery duplex not available at our institution and CT angiogram is a very poor test with a lower than acceptable sensitivity and specificity at our institution which would  require an access of contrast with no treatment options. For this reason, renal artery angiogram certainly reasonable and would be the best treatment option at this point. Risks and benefits have been discussed with the patient and she is agreeable to proceed. This will be done today. 2. Pneumonia. Being treated with antibiotics. Respiratory status seems to be stable. 3. Hyponatremia on admission. Primary service has been getting IV fluids and checking sodiums. Improving. 4. Hyperlipidemia. lipid control important in reducing the progression of atherosclerotic disease. Continue statin therapy    Leotis Pain, MD  05/07/2016 8:59 AM    This note was created with Dragon medical transcription system.  Any error is purely unintentional

## 2016-05-07 NOTE — Evaluation (Signed)
Physical Therapy Evaluation Patient Details Name: Joanne Michael MRN: [id number] DOB: 03-09-1924 Today's Date: 05/07/2016   History of Present Illness  Patient is an 83 y/o female s/p aortogram of renal arteries, came in with chest pain, shortness of breath and pleural effusion.   Clinical Impression  Patient seen post-op renal aortogram due to elevated blood pressures. Her BP has been high, though no symptoms noted during this session (she complained of slight dizziness at times during ambulation). She was transferring out of bed, began coughing and stopped transfer. Given some generalized weakness and hardness of hearing she had difficulty completing transfer without assistance. Patient was able to perform sit to stand transfer and ambulate in hallway with HHA but no loss of balance, she walked 7 laps around RN station yesterday. She is at increased risk of falling given need for HHA and decreased gait speed, would recommend HHPT consult for home set up and HEP.     Follow Up Recommendations Home health PT    Equipment Recommendations  Rolling walker with 5" wheels    Recommendations for Other Services       Precautions / Restrictions Precautions Precautions: Fall Restrictions Weight Bearing Restrictions: No      Mobility  Bed Mobility Overal bed mobility: Needs Assistance Bed Mobility: Supine to Sit     Supine to sit: Min guard;Min assist     General bed mobility comments: Patient initially hesitant to exit bed, however after discussion with daughter agrees to therapy, given her stopping midway through transfer she did require minor assistance.   Transfers Overall transfer level: Needs assistance   Transfers: Sit to/from Stand Sit to Stand: Min guard         General transfer comment: No loss of balance, swift transfer completed.   Ambulation/Gait Ambulation/Gait assistance: Min guard Ambulation Distance (Feet): 225 Feet Assistive device: 1 person hand held  assist Gait Pattern/deviations: Shuffle;Decreased step length - right;Decreased step length - left   Gait velocity interpretation: <1.8 ft/sec, indicative of risk for recurrent falls General Gait Details: Patient takes very short step lengths, requires holding hand on railing but no loss of balance or buckling. No desaturation of O2 during ambulation.   Stairs            Wheelchair Mobility    Modified Rankin (Stroke Patients Only)       Balance Overall balance assessment: Needs assistance Sitting-balance support: No upper extremity supported Sitting balance-Leahy Scale: Good     Standing balance support: Single extremity supported Standing balance-Leahy Scale: Fair                               Pertinent Vitals/Pain Pain Assessment:  (Patient reports mild pain in abdominal area.)    Home Living Family/patient expects to be discharged to:: Private residence Living Arrangements: Children Available Help at Discharge: Family;Available 24 hours/day Type of Home: House Home Access: Level entry     Home Layout: Two level;Able to live on main level with bedroom/bathroom Home Equipment: Gilford Rile - 2 wheels;Cane - single point      Prior Function Level of Independence: Independent         Comments: Patient has had 1 fall in the last few years with no AD.      Hand Dominance        Extremity/Trunk Assessment   Upper Extremity Assessment Upper Extremity Assessment: Overall WFL for tasks assessed    Lower Extremity Assessment  Lower Extremity Assessment: Overall WFL for tasks assessed       Communication   Communication: No difficulties  Cognition Arousal/Alertness: Awake/alert Behavior During Therapy: WFL for tasks assessed/performed Overall Cognitive Status: Within Functional Limits for tasks assessed                      General Comments      Exercises     Assessment/Plan    PT Assessment Patient needs continued PT services   PT Problem List Decreased strength;Decreased mobility;Decreased balance;Decreased activity tolerance;Decreased knowledge of use of DME       PT Treatment Interventions DME instruction;Therapeutic activities;Therapeutic exercise;Gait training;Stair training;Balance training;Neuromuscular re-education    PT Goals (Current goals can be found in the Care Plan section)  Acute Rehab PT Goals Patient Stated Goal: To return home  PT Goal Formulation: With patient/family Time For Goal Achievement: 05/21/16 Potential to Achieve Goals: Good    Frequency Min 2X/week   Barriers to discharge        Co-evaluation               End of Session Equipment Utilized During Treatment: Gait belt;Oxygen Activity Tolerance: Patient tolerated treatment well Patient left: in chair;with chair alarm set;with call bell/phone within reach;with family/visitor present Nurse Communication: Mobility status PT Visit Diagnosis: Unsteadiness on feet (R26.81);Muscle weakness (generalized) (M62.81)         Time: 4373-5789 PT Time Calculation (min) (ACUTE ONLY): 22 min   Charges:   PT Evaluation $PT Eval Moderate Complexity: 1 Procedure     PT G Codes:        Royce Macadamia PT, DPT, CSCS    05/07/2016, 3:55 PM

## 2016-05-07 NOTE — Progress Notes (Signed)
PT Cancellation Note  Patient Details Name: CLETUS MEHLHOFF MRN: [id number] DOB: [birth year]   Cancelled Treatment:    Reason Eval/Treat Not Completed: Patient at procedure or test/unavailable; PT evaluation attempted, patient out of room at procedure.  Will attempt PT eval at another time/date as appropriate.    Blanche East Raaga Maeder 05/07/2016, 11:13 AM

## 2016-05-08 ENCOUNTER — Encounter: Payer: Self-pay | Admitting: Vascular Surgery

## 2016-05-08 ENCOUNTER — Encounter (INDEPENDENT_AMBULATORY_CARE_PROVIDER_SITE_OTHER): Payer: Medicare Other

## 2016-05-08 LAB — BASIC METABOLIC PANEL
ANION GAP: 9 (ref 5–15)
BUN: 26 mg/dL — ABNORMAL HIGH (ref 6–20)
CALCIUM: 8.7 mg/dL — AB (ref 8.9–10.3)
CO2: 26 mmol/L (ref 22–32)
CREATININE: 1.71 mg/dL — AB (ref 0.44–1.00)
Chloride: 102 mmol/L (ref 101–111)
GFR calc Af Amer: 29 mL/min — ABNORMAL LOW (ref >60)
GFR, EST NON AFRICAN AMERICAN: 25 mL/min — AB (ref >60)
Glucose, Bld: 135 mg/dL — ABNORMAL HIGH (ref 65–99)
Potassium: 3.1 mmol/L — ABNORMAL LOW (ref 3.5–5.1)
SODIUM: 137 mmol/L (ref 135–145)

## 2016-05-08 LAB — GLUCOSE, CAPILLARY
GLUCOSE-CAPILLARY: 115 mg/dL — AB (ref 65–99)
Glucose-Capillary: 146 mg/dL — ABNORMAL HIGH (ref 65–99)

## 2016-05-08 MED ORDER — INSULIN ASPART 100 UNIT/ML ~~LOC~~ SOLN: 0.0000 [IU] | Freq: Three times a day (TID) | SUBCUTANEOUS | Status: DC

## 2016-05-08 MED ORDER — POTASSIUM CHLORIDE CRYS ER 20 MEQ PO TBCR
40.0000 meq | EXTENDED_RELEASE_TABLET | Freq: Once | ORAL | Status: AC
  Administered 2016-05-08: 40 meq via ORAL
  Filled 2016-05-08: qty 2

## 2016-05-08 MED ORDER — SODIUM CHLORIDE 0.9 % IV SOLN
INTRAVENOUS | Status: DC
  Administered 2016-05-08: 11:00:00 via INTRAVENOUS

## 2016-05-08 MED ORDER — CEFUROXIME AXETIL 500 MG PO TABS
500.0000 mg | ORAL_TABLET | Freq: Two times a day (BID) | ORAL | Status: DC
  Administered 2016-05-08 – 2016-05-09 (×3): 500 mg via ORAL
  Filled 2016-05-08 (×3): qty 1

## 2016-05-08 NOTE — Care Management (Signed)
Patient admitted with PNA.  Patient lives at home with daughter.  Patient has been previously open with [facility] in the past.  Patient has RW and cane in the home.  Patient is currently requiring acute O2.  RN to attempt to wean. PT has evaluated patient and recommends home health PT.  RNCM following for discharge disposition

## 2016-05-08 NOTE — Progress Notes (Signed)
Physical Therapy Treatment Patient Details Name: Joanne Michael MRN: [id number] DOB: 1924-12-26 Today's Date: 05/08/2016    History of Present Illness Patient is an 83 y/o female s/p aortogram of renal arteries, came in with chest pain, shortness of breath and pleural effusion.     PT Comments    Pt with increased confusion today.  Pt is aware of feeling off this morning.  Family in and agreed with increased confusion.  Pt repeating information frequently and difficult to keep on task at times.  Very talkative this morning.   She was able to stand and ambulate x 1 around unit.  A walker was tried today.  Overall she did well but required +1 assist for balance and safety.  She had poor hand placements with transfers today and some impulsiveness with transitions.  She did not use a walker at baseline but family stated there is one at home if needed.     Follow Up Recommendations  Home health PT, 24 hour supervision and assist with mobility     Equipment Recommendations  Rolling walker with 5" wheels    Recommendations for Other Services       Precautions / Restrictions Precautions Precautions: Fall Restrictions Weight Bearing Restrictions: No    Mobility  Bed Mobility               General bed mobility comments: in chair  Transfers Overall transfer level: Needs assistance Equipment used: Rolling walker (2 wheeled) Transfers: Sit to/from Stand Sit to Stand: Min guard         General transfer comment: verbal cues for hand placements, generally steady  Ambulation/Gait Ambulation/Gait assistance: Min guard Ambulation Distance (Feet): 225 Feet Assistive device: Rolling walker (2 wheeled) Gait Pattern/deviations: Step-through pattern   Gait velocity interpretation: <1.8 ft/sec, indicative of risk for recurrent falls General Gait Details: Did well with walker today,    Stairs            Wheelchair Mobility    Modified Rankin (Stroke Patients Only)        Balance Overall balance assessment: Needs assistance Sitting-balance support: Feet supported Sitting balance-Leahy Scale: Good     Standing balance support: Bilateral upper extremity supported Standing balance-Leahy Scale: Fair                      Cognition Arousal/Alertness: Awake/alert Behavior During Therapy: WFL for tasks assessed/performed Overall Cognitive Status: Impaired/Different from baseline Area of Impairment: Orientation;Attention;Following commands               General Comments: increased confusion noted today.  family in and agreed.  pt aware of feeling a bit confused this morning.      Exercises Other Exercises Other Exercises: standing exercises x 10 bilaterally with light UE support for heel raises, marches, SLR    General Comments        Pertinent Vitals/Pain Pain Assessment: No/denies pain    Home Living                      Prior Function            PT Goals (current goals can now be found in the care plan section) Progress towards PT goals: Progressing toward goals    Frequency    Min 2X/week      PT Plan      Co-evaluation             End of Session Equipment Utilized  During Treatment: Gait belt;Oxygen Activity Tolerance: Patient tolerated treatment well Patient left: in chair;with chair alarm set;with call bell/phone within reach;with family/visitor present         Time: 0935-1000 PT Time Calculation (min) (ACUTE ONLY): 25 min  Charges:  $Gait Training: 8-22 mins $Therapeutic Exercise: 8-22 mins                    G Codes:       Chesley Noon, PTA 05/08/16, 10:44 AM

## 2016-05-08 NOTE — Progress Notes (Signed)
[facility] at [place] NAME: Joanne Michael    MR#:  [id number]  DATE OF BIRTH:  May 09, 1924  SUBJECTIVE:  Came in with sob and cough Resting after angiogram. bp improved after RA stent placement. dter in the room Mild confusion this am---now clearing  REVIEW OF SYSTEMS:   Review of Systems  Constitutional: Negative for chills, fever and weight loss.  HENT: Negative for ear discharge, ear pain and nosebleeds.   Eyes: Negative for blurred vision, pain and discharge.  Respiratory: Positive for cough and shortness of breath. Negative for sputum production, wheezing and stridor.   Cardiovascular: Negative for chest pain, palpitations, orthopnea and PND.  Gastrointestinal: Negative for abdominal pain, diarrhea, nausea and vomiting.  Genitourinary: Negative for frequency and urgency.  Musculoskeletal: Negative for back pain and joint pain.  Neurological: Positive for weakness. Negative for sensory change, speech change and focal weakness.  Psychiatric/Behavioral: Negative for depression and hallucinations. The patient is not nervous/anxious.    Tolerating Diet:yes Tolerating PT: home health PT  DRUG ALLERGIES:   Allergies  Allergen Reactions  . Oxycodone Itching  . Codeine Nausea And Vomiting and Itching  . Hydrocodone-Acetaminophen Other (See Comments)    Hallucinates  . Iodine Swelling, Other (See Comments), Itching and Nausea Only    Patient is allergic to seafood and this is the cause why she can't take medication.  . Meperidine Hcl     Unknown reactions.  . Other Swelling    Seafood- tongue swelling and discoloration Patient allergic to seafood, tongue swelling and discoloration.  . Propoxyphene N-Acetaminophen Other (See Comments)    Unknown reaction    VITALS:  Blood pressure (!) 162/58, pulse 83, temperature 97.7 F (36.5 C), temperature source Oral, resp. rate 18, height 4\' 11"  (1.499 m), weight 54.1 kg (119 lb 3.2  oz), SpO2 98 %.  PHYSICAL EXAMINATION:   Physical Exam  GENERAL:  83 y.o.-year-old patient lying in the bed with no acute distress.  EYES: Pupils equal, round, reactive to light and accommodation. No scleral icterus. Extraocular muscles intact.  HEENT: Head atraumatic, normocephalic. Oropharynx and nasopharynx clear.  NECK:  Supple, no jugular venous distention. No thyroid enlargement, no tenderness.  LUNGS: Normal breath sounds bilaterally, no wheezing, rales, rhonchi. No use of accessory muscles of respiration.  CARDIOVASCULAR: S1, S2 normal. No murmurs, rubs, or gallops.  ABDOMEN: Soft, nontender, nondistended. Bowel sounds present. No organomegaly or mass.  EXTREMITIES: No cyanosis, clubbing or edema b/l.    NEUROLOGIC: Cranial nerves II through XII are intact. No focal Motor or sensory deficits b/l.   PSYCHIATRIC:  patient is alert and oriented x 3.  SKIN: No obvious rash, lesion, or ulcer.   LABORATORY PANEL:  CBC  Recent Labs Lab 05/05/16 1203  WBC 7.4  HGB 10.8*  HCT 31.2*  PLT 194    Chemistries   Recent Labs Lab 05/08/16 0810  NA 137  K 3.1*  CL 102  CO2 26  GLUCOSE 135*  BUN 26*  CREATININE 1.71*  CALCIUM 8.7*   Cardiac Enzymes  Recent Labs Lab 05/05/16 1203  TROPONINI <0.03   RADIOLOGY:  No results found. ASSESSMENT AND PLAN:  Joanne Michael  is a 83 y.o. female with a known history of Stage IV chronic kidney disease sees Dr. Candiss Norse as an outpatient, uncontrolled hypertension, GERD, hyperlipidemia and multiple other medical problems is presenting to the ED with a chief complaint of shortness of breath associated with chest pain and rib pain  when she is coughing. Patient is feeling weak and shortness of breath is progressively getting worse for the past week. Patient is bringing up whitish yellow phlegm and came into the emergency department chest x-ray has revealed left greater than right-sided infiltrate with some pleural effusion   # Community  acquired pneumonia IV Rocephin and azithromycin---change to oral abxs Albuterol as needed Continue home medication tramadol as needed  #Hyponatremia sodium at 124 Patient is mentating fine Could be from poor by mouth intake with underlying pneumonia 124--125---125---137 Received IV fluids  #Hypertensive urgency Patient has chronic kidney disease stage IV and history of uncontrolled high blood pressure Continue Coreg, clonidine, Cardura,hydralazine Nephrology consult is placed to Dr. Candiss Norse Pt underwent renal angiogram with placement of renal artery stent on 05/08/16 by Dr Lucky Cowboy. BP much improved  #Hyperlipidemia   DVT prophylaxis with Lovenox dose adjusted to renal insufficiency   Case discussed with Care Management/Social Worker. Management plans discussed with the patient, family and they are in agreement.  CODE STATUS:full  DVT Prophylaxis: lovenox TOTAL TIME TAKING CARE OF THIS PATIENT: 30 minutes.  >50% time spent on counselling and coordination of care  POSSIBLE D/C IN 1-2 DAYS, DEPENDING ON CLINICAL CONDITION.  Note: This dictation was prepared with Dragon dictation along with smaller phrase technology. Any transcriptional errors that result from this process are unintentional.  Kenyetta Wimbish M.D on 05/08/2016 at 11:41 AM  Between 7am to 6pm - Pager - [phone]  After 6pm go to [email or web address] - password [email or web address] [facility]  Office  [phone]  CC: Primary care physician; Dion Body, MD

## 2016-05-08 NOTE — Plan of Care (Signed)
Problem: Activity: Goal: Risk for activity intolerance will decrease Outcome: Progressing Pt was up to chair and walked with pt.

## 2016-05-08 NOTE — Progress Notes (Signed)
Subjective:  Patient seen and evaluated at bedside. She had some intermittent confusion last night. Creatinine currently 1.71. Sodium improved to 137.    Objective:  Vital signs in last 24 hours:  Temp:  [97.7 F (36.5 C)-98.9 F (37.2 C)] 98.9 F (37.2 C) (03/13 1250) Pulse Rate:  [74-94] 74 (03/13 1250) Resp:  [16-18] 18 (03/13 1250) BP: (154-188)/(52-58) 167/52 (03/13 1250) SpO2:  [96 %-98 %] 98 % (03/13 1250)  Weight change:  Filed Weights   05/05/16 1205 05/05/16 1802  Weight: 54.4 kg (120 lb) 54.1 kg (119 lb 3.2 oz)    Intake/Output:    Intake/Output Summary (Last 24 hours) at 05/08/16 1440 Last data filed at 05/08/16 1425  Gross per 24 hour  Intake          2377.53 ml  Output             2000 ml  Net           377.53 ml     Physical Exam: General: Resting in bed   HEENT Anicteric, moist oral mucous membranes   Neck Supple   Pulm/lungs Right basilar crackles   CVS/Heart regular rhythm, prominent systolic murmur   Abdomen:  Soft, nontender   Extremities: No edema   Neurologic: Alert, oriented   Skin: Normal turgor           Basic Metabolic Panel:   Recent Labs Lab 05/05/16 1203 05/05/16 1802 05/05/16 2118 05/06/16 0332 05/08/16 0810  NA 124* 125* 125* 125* 137  K 3.9  --   --   --  3.1*  CL 86*  --   --   --  102  CO2 28  --   --   --  26  GLUCOSE 130*  --   --   --  135*  BUN 31*  --   --   --  26*  CREATININE 1.62*  --   --   --  1.71*  CALCIUM 8.4*  --   --   --  8.7*     CBC:  Recent Labs Lab 05/05/16 1203  WBC 7.4  HGB 10.8*  HCT 31.2*  MCV 89.1  PLT 194      Microbiology:  Recent Results (from the past 720 hour(s))  Culture, blood (routine x 2)     Status: None (Preliminary result)   Collection Time: 05/05/16  2:53 PM  Result Value Ref Range Status   Specimen Description BLOOD  RT HAND  Final   Special Requests BOTTLES DRAWN AEROBIC AND ANAEROBIC  [facility]V  Final   Culture NO GROWTH 3 DAYS  Final   Report Status  PENDING  Incomplete  Culture, blood (routine x 2)     Status: None (Preliminary result)   Collection Time: 05/05/16  2:58 PM  Result Value Ref Range Status   Specimen Description BLOOD  RT AC  Final   Special Requests BOTTLES DRAWN AEROBIC AND ANAEROBIC  B[facility]V  Final   Culture NO GROWTH 3 DAYS  Final   Report Status PENDING  Incomplete    Coagulation Studies: No results for input(s): LABPROT, INR in the last 72 hours.  Urinalysis:  Recent Labs  05/05/16 1631  COLORURINE STRAW*  LABSPEC 1.002*  PHURINE 7.0  GLUCOSEU NEGATIVE  HGBUR NEGATIVE  BILIRUBINUR NEGATIVE  KETONESUR NEGATIVE  PROTEINUR 100*  NITRITE NEGATIVE  LEUKOCYTESUR TRACE*      Imaging: No results found.   Medications:   . sodium chloride 50 mL/hr  at 05/08/16 1115   . allopurinol  100 mg Oral BID  . aspirin EC  81 mg Oral Daily  . atorvastatin  20 mg Oral QHS  . azithromycin  500 mg Oral Daily  . calcium-vitamin D  1 tablet Oral Daily  . carvedilol  6.25 mg Oral BID  . cefUROXime  500 mg Oral BID WC  . cloNIDine  0.1 mg Oral BID  . clopidogrel  75 mg Oral Daily  . doxazosin  8 mg Oral Daily  . enoxaparin (LOVENOX) injection  30 mg Subcutaneous Q24H  . ferrous sulfate  325 mg Oral Q breakfast  . furosemide  20 mg Oral Q M,W,F  . gabapentin  300 mg Oral QHS  . hydrALAZINE  25 mg Oral Q8H  . insulin aspart  0-9 Units Subcutaneous TID WC  . loratadine  10 mg Oral Daily  . mouth rinse  15 mL Mouth Rinse BID  . multivitamin with minerals  1 tablet Oral Daily  . pantoprazole  40 mg Oral QHS  . ascorbic acid  1,000 mg Oral Daily   albuterol, docusate sodium, fluticasone, hydrALAZINE, traMADol  Assessment/ Plan:  83 y.o. female 83 year old Caucasian female with hypertension, degenerative joint disease, chronic kidney disease, gout, C. difficile colitis in 2014, history of acute renal failure and GERD is admitted for pneumonia  1. Chronic kidney disease stage IV. -  reatinine currently 1.7 and  relatively stable.  We will continue to monitor renal function trend as she was exposed to contrast yesterday.  2. Hyponatremia Likely "tea and toast diet". Urine specific gravity 1.002 and urine osmolality of 100  indicates didlute urine -  Serum sodium up to 137 this a.m.  Discontinue 0.9 normal saline.  3. Right renal artery stenosis. Patient underwent right renal artery stent placement 05/07/16.   Continue carvedilol, clonidine, doxazosin, hydralazine for now.  Hopefully we will be able to wean some of these medications over the next several weeks to months.   LOS: 3 [facility] 3/13/20182:40 PM

## 2016-05-09 LAB — GLUCOSE, CAPILLARY
GLUCOSE-CAPILLARY: 87 mg/dL (ref 65–99)
Glucose-Capillary: 138 mg/dL — ABNORMAL HIGH (ref 65–99)

## 2016-05-09 MED ORDER — CEFUROXIME AXETIL 500 MG PO TABS: 500.0000 mg | ORAL_TABLET | ORAL | Status: DC

## 2016-05-09 MED ORDER — AZITHROMYCIN 250 MG PO TABS: ORAL_TABLET | ORAL | 0 refills | Status: DC

## 2016-05-09 MED ORDER — HYDRALAZINE HCL 25 MG PO TABS: 25.0000 mg | ORAL_TABLET | Freq: Three times a day (TID) | ORAL | 1 refills | Status: DC

## 2016-05-09 MED ORDER — HALOPERIDOL LACTATE 5 MG/ML IJ SOLN
1.0000 mg | Freq: Four times a day (QID) | INTRAMUSCULAR | Status: DC | PRN
  Administered 2016-05-09: 1 mg via INTRAVENOUS
  Filled 2016-05-09: qty 1

## 2016-05-09 MED ORDER — CLOPIDOGREL BISULFATE 75 MG PO TABS
75.0000 mg | ORAL_TABLET | Freq: Every day | ORAL | 2 refills | Status: DC
Start: 2016-05-10 — End: 2016-09-12

## 2016-05-09 MED ORDER — CEFUROXIME AXETIL 500 MG PO TABS: 500.0000 mg | ORAL_TABLET | ORAL | 0 refills | Status: DC

## 2016-05-09 NOTE — Discharge Summary (Addendum)
[facility] at [place] NAME: Joanne Michael    MR#:  [id number]  DATE OF BIRTH:  10/23/1924  DATE OF ADMISSION:  05/05/2016 ADMITTING PHYSICIAN: Nicholes Mango, MD  DATE OF DISCHARGE: 05/09/16  PRIMARY CARE PHYSICIAN: Dion Body, MD    ADMISSION DIAGNOSIS:  Hyponatremia [E87.1] Pneumonia, unspecified organism [J18.9]  DISCHARGE DIAGNOSIS:  Community acquired Pneumonia Malignant HTN due to right sided severe Renal artery stenosis s/p stent placement 05/07/16 by Dr Lucky Cowboy CKD-III SECONDARY DIAGNOSIS:   Past Medical History:  Diagnosis Date  . Arthritis   . Cancer ([place])    melanoma  . Cat allergies   . Chronic renal failure   . Fatigue   . GERD (gastroesophageal reflux disease)   . Gout   . H/O Clostridium difficile infection   . Hay fever    as child  . Hiatal hernia   . HLD (hyperlipidemia)   . HTN (hypertension)   . Iron deficiency anemia   . Spinal stenosis     HOSPITAL COURSE:  Joanne McKinneyis a 83 y.o. femalewith a known history of Stage IV chronic kidney disease sees Dr. Candiss Norse as an outpatient, uncontrolled hypertension, GERD, hyperlipidemia and multiple other medical problems is presenting to the ED with a chief complaint of shortness of breath associated with chest pain and rib pain when she is coughing. Patient is feeling weak and shortness of breath is progressively getting worse for the past week. Patient is bringing up whitish yellow phlegm and came into the emergency department chest x-ray has revealed left greater than right-sided infiltrate with some pleural effusion   # Community acquired pneumonia IV Rocephin and azithromycin---change to oral abxs Albuterol as needed Continue home medication tramadol as needed Assess for home oxygen use  #Hyponatremia sodium at 124 Patient is mentating fine Could be from poor by mouth intake with underlying pneumonia 124--125---125---137 Received IV  fluids  #Hypertensive urgency due to severe Renal artery stenosis right  Patient has chronic kidney disease stage IV and history of uncontrolled high blood pressure Continue Coreg, clonidine, Cardura,hydralazine Nephrology consult is placed to Dr. Candiss Norse Pt underwent renal angiogram with placement of renal artery stent on 05/08/16 by Dr Lucky Cowboy. BP improving some Cont asa and Plavix added by Dr Lucky Cowboy  #Hyperlipidemia   DVT prophylaxis with Lovenox dose adjusted to renal insufficiency   HHPT recommended D/c home d/w pt and dter  CONSULTS OBTAINED:  Treatment Team:  Murlean Iba, MD Algernon Huxley, MD  DRUG ALLERGIES:   Allergies  Allergen Reactions  . Oxycodone Itching  . Codeine Nausea And Vomiting and Itching  . Hydrocodone-Acetaminophen Other (See Comments)    Hallucinates  . Iodine Swelling, Other (See Comments), Itching and Nausea Only    Patient is allergic to seafood and this is the cause why she can't take medication.  . Meperidine Hcl     Unknown reactions.  . Other Swelling    Seafood- tongue swelling and discoloration Patient allergic to seafood, tongue swelling and discoloration.  . Propoxyphene N-Acetaminophen Other (See Comments)    Unknown reaction    DISCHARGE MEDICATIONS:   Current Discharge Medication List    START taking these medications   Details  azithromycin (ZITHROMAX) 250 MG tablet Take daily as directed Qty: 2 each, Refills: 0    cefUROXime (CEFTIN) 500 MG tablet Take 1 tablet (500 mg total) by mouth daily. Qty: 6 tablet, Refills: 0    clopidogrel (PLAVIX) 75 MG tablet Take 1 tablet (  75 mg total) by mouth daily. Qty: 30 tablet, Refills: 2    hydrALAZINE (APRESOLINE) 25 MG tablet Take 1 tablet (25 mg total) by mouth every 8 (eight) hours. Qty: 60 tablet, Refills: 1      CONTINUE these medications which have NOT CHANGED   Details  albuterol (PROVENTIL HFA;VENTOLIN HFA) 108 (90 BASE) MCG/ACT inhaler Inhale 1-2 puffs into the lungs every  6 (six) hours as needed for wheezing or shortness of breath. Qty: 1 Inhaler, Refills: 6    allopurinol (ZYLOPRIM) 100 MG tablet Take 100 mg by mouth 2 (two) times daily.    ascorbic acid (VITAMIN C) 1000 MG tablet Take 1,000 mg by mouth daily.    aspirin EC 81 MG tablet Take 81 mg by mouth daily.    atorvastatin (LIPITOR) 20 MG tablet Take 20 mg by mouth at bedtime.     Calcium Carbonate-Vitamin D (CALTRATE 600+D) 600-400 MG-UNIT per tablet Take 1 tablet by mouth daily.      carvedilol (COREG) 3.125 MG tablet Take 3.125 mg by mouth 2 (two) times daily.    cetirizine (ZYRTEC) 10 MG chewable tablet Chew 10 mg by mouth daily.      cloNIDine (CATAPRES) 0.2 MG tablet Take 0.2 mg by mouth 3 (three) times daily.     docusate sodium (COLACE) 100 MG capsule Take 1 capsule (100 mg total) by mouth daily as needed. Qty: 30 capsule, Refills: 0    doxazosin (CARDURA) 8 MG tablet Take 1 tablet (8 mg total) by mouth daily. Qty: 30 tablet, Refills: 3    ferrous sulfate 325 (65 FE) MG tablet Take 325 mg by mouth daily with breakfast.    fluticasone (FLONASE) 50 MCG/ACT nasal spray Place 2 sprays into both nostrils at bedtime as needed for allergies or rhinitis. Qty: 16 g, Refills: 0    furosemide (LASIX) 20 MG tablet Take 20 mg by mouth every other day. Takes 1 tab on Monday, Wed, and Friday    gabapentin (NEURONTIN) 300 MG capsule Take 1 capsule (300 mg total) by mouth at bedtime. Qty: 30 capsule, Refills: 5   Associated Diagnoses: Neuropathic pain    Multiple Vitamin (MULTIVITAMIN) tablet Take 1 tablet by mouth daily.    pantoprazole (PROTONIX) 40 MG tablet Take 40 mg by mouth at bedtime.  Qty: 30 tablet, Refills: 11    Probiotic Product (PHILLIPS COLON HEALTH PO) Take 1 capsule by mouth daily.    traMADol (ULTRAM) 50 MG tablet Take 1 tablet (50 mg total) by mouth every 6 (six) hours as needed for severe pain. Qty: 120 tablet, Refills: 2        If you experience worsening of your  admission symptoms, develop shortness of breath, life threatening emergency, suicidal or homicidal thoughts you must seek medical attention immediately by calling 911 or calling your MD immediately  if symptoms less severe.  You Must read complete instructions/literature along with all the possible adverse reactions/side effects for all the Medicines you take and that have been prescribed to you. Take any new Medicines after you have completely understood and accept all the possible adverse reactions/side effects.   Please note  You were cared for by a hospitalist during your hospital stay. If you have any questions about your discharge medications or the care you received while you were in the hospital after you are discharged, you can call the unit and asked to speak with the hospitalist on call if the hospitalist that took care of you is not available.  Once you are discharged, your primary care physician will handle any further medical issues. Please note that NO REFILLS for any discharge medications will be authorized once you are discharged, as it is imperative that you return to your primary care physician (or establish a relationship with a primary care physician if you do not have one) for your aftercare needs so that they can reassess your need for medications and monitor your lab values. Today   SUBJECTIVE   Doing ok VITAL SIGNS:  Blood pressure (!) 178/71, pulse 78, temperature 99.1 F (37.3 C), temperature source Oral, resp. rate 20, height 4\' 11"  (1.499 m), weight 54.1 kg (119 lb 3.2 oz), SpO2 97 %.  I/O:    Intake/Output Summary (Last 24 hours) at 05/09/16 1222 Last data filed at 05/09/16 1026  Gross per 24 hour  Intake              538 ml  Output              951 ml  Net             -413 ml    PHYSICAL EXAMINATION:  GENERAL:  83 y.o.-year-old patient lying in the bed with no acute distress.  EYES: Pupils equal, round, reactive to light and accommodation. No scleral  icterus. Extraocular muscles intact.  HEENT: Head atraumatic, normocephalic. Oropharynx and nasopharynx clear.  NECK:  Supple, no jugular venous distention. No thyroid enlargement, no tenderness.  LUNGS: Normal breath sounds bilaterally, no wheezing, rales,rhonchi or crepitation. No use of accessory muscles of respiration.  CARDIOVASCULAR: S1, S2 normal. No murmurs, rubs, or gallops.  ABDOMEN: Soft, non-tender, non-distended. Bowel sounds present. No organomegaly or mass.  EXTREMITIES: No pedal edema, cyanosis, or clubbing.  NEUROLOGIC: Cranial nerves II through XII are intact. Muscle strength 5/5 in all extremities. Sensation intact. Gait not checked.  PSYCHIATRIC: The patient is alert and oriented x 3.  SKIN: No obvious rash, lesion, or ulcer.   DATA REVIEW:   CBC   Recent Labs Lab 05/05/16 1203  WBC 7.4  HGB 10.8*  HCT 31.2*  PLT 194    Chemistries   Recent Labs Lab 05/08/16 0810  NA 137  K 3.1*  CL 102  CO2 26  GLUCOSE 135*  BUN 26*  CREATININE 1.71*  CALCIUM 8.7*    Microbiology Results   Recent Results (from the past 240 hour(s))  Culture, blood (routine x 2)     Status: None (Preliminary result)   Collection Time: 05/05/16  2:53 PM  Result Value Ref Range Status   Specimen Description BLOOD  RT HAND  Final   Special Requests BOTTLES DRAWN AEROBIC AND ANAEROBIC  [place]  Final   Culture NO GROWTH 4 DAYS  Final   Report Status PENDING  Incomplete  Culture, blood (routine x 2)     Status: None (Preliminary result)   Collection Time: 05/05/16  2:58 PM  Result Value Ref Range Status   Specimen Description BLOOD  RT AC  Final   Special Requests BOTTLES DRAWN AEROBIC AND ANAEROBIC  [place]  Final   Culture NO GROWTH 4 DAYS  Final   Report Status PENDING  Incomplete    RADIOLOGY:  No results found.   Management plans discussed with the patient, family and they are in agreement.  CODE STATUS:     Code Status Orders        Start     Ordered   05/05/16  1800  Full code  Continuous  05/05/16 1800    Code Status History    Date Active Date Inactive Code Status Order ID Comments User Context   05/22/2015  5:57 PM 05/25/2015  7:02 PM Full Code [id number]  Dustin Flock, MD ED      TOTAL TIME TAKING CARE OF THIS PATIENT: *1minutes.    Tylek Boney M.D on 05/09/2016 at 12:22 PM  Between 7am to 6pm - Pager - [phone] After 6pm go to [email or web address] - password [email or web address] [facility]  Office  [phone]  CC: Primary care physician; Dion Body, MD

## 2016-05-09 NOTE — Progress Notes (Signed)
.  Alert and oriented x 3. Vital signs stable . No signs of acute distress. Discharge instructions given. Patient and family  verbalized understanding. No other issues noted at this time.

## 2016-05-09 NOTE — Care Management (Signed)
Patient to discharge home today. Patient admitted with PNA.  Patient lives at home with daughter.  PT has assessed patient recommended home health PT. Patient has RW and cane in the home.  Patient did not qualify for home O2.  Patient was provided with home health agency preference . Patient and daughter state that they do not have a preference of agency.  Referral made to Sarah with [facility]. Daughter to transport at discharge.  RNCM signing off.

## 2016-05-10 LAB — CULTURE, BLOOD (ROUTINE X 2)
CULTURE: NO GROWTH
Culture: NO GROWTH

## 2016-05-15 ENCOUNTER — Ambulatory Visit: Payer: Medicare Other | Admitting: Cardiovascular Disease

## 2016-05-17 ENCOUNTER — Ambulatory Visit: Payer: Medicare Other | Admitting: Pain Medicine

## 2016-05-23 NOTE — Progress Notes (Signed)
Cardiology Office Note  Date:  05/25/2016   ID:  Joanne Michael, DOB 1925-01-09, MRN [id number]  PCP:  Joanne Body, MD   Chief Complaint  Patient presents with  . other    6 month f/u pt would like you to take a look @ injury left ankle. Meds reviewed verbally with pt.    HPI:  Joanne Michael is a pleasant 83 yo woman with  mild aortic valve stenosis in 2011,  GERD,  hyperlipidemia,  hypertension  Previous episode  of near syncope Mild carotid disease Less than 39% b/l disease 11/2015 who presented for lower extremity edema that started in December 2014,   improved symptoms after amlodipine was held She presents today for follow-up of her blood pressure,Leg bruising  Significant issues over the past several months seen in the emergency room 05/03/2016 for dysuria, fecal impaction Started on Miramax Seen in the emergency room 04/16/2016 contusions to the face Seen in the emergency room 01/17/2016 urinary retention fecal impaction  5 days ago, kids hurt leg, Large hematoma,Left lower extremity In the office today large blistered hematoma. Patient reports it is improving over the past week  Recently sent to the hospital for Hypertensive urgency due to severe Renal artery stenosis right  Renal angioplasty, 3 weeks ago, Dr. dew On asa and plavix  Also in the hospital for PNA ,  05/09/2016 RequiredABX,  Low sodium On arrival,Received normal saline, sodium 124 up to 137 Potassium 3.1 Had diarrhea when she got home Reports having blood work done through nephrology but results not available We have called our office and left message She reports diarrhea has resolved  EKG on today's visit shows normal sinus rhythm with rate 60 bpm no significant ST or T-wave changes  Other past medical history reviewed Previous leg edema resolved by holding amlodipine Denies having any further episodes of near syncope or syncope  08/08/2014 she was at church when she developed  syncope/near syncope. She was in a sitting position. Reports indicate low blood pressure. She was taken to the emergency room, lab work showed elevated BUN and creatinine consistent with dehydration. She was given fluids with improvement of her symptoms. She denied any recent illnesses. Several days prior she had radiofrequency ablation of her back.   Lab work from primary care shows creatinine 2.0, GFR in the 20s she was previously on lisinopril and HCTZ, pill. This was held and she was changed to amlodipine Laboratory from 07/08/2011 shows creatinine 2.1, GFR 22, BUN 21, potassium 3.3  Prior echocardiogram in 2011 showing normal LV systolic function, diastolic relaxation abnormality, mild aortic valve stenosis with no significant gradient, mild MR, high normal right ventricular systolic pressures  PMH:   has a past medical history of Arthritis; Cancer ([place]); Cat allergies; Chronic renal failure; Fatigue; GERD (gastroesophageal reflux disease); Gout; H/O Clostridium difficile infection; Hay fever; Hiatal hernia; HLD (hyperlipidemia); HTN (hypertension); Iron deficiency anemia; and Spinal stenosis.  PSH:    Past Surgical History:  Procedure Laterality Date  . APPENDECTOMY    . BACK SURGERY    . CATARACT EXTRACTION    . CHOLECYSTECTOMY    . feet surgery    . HERNIA REPAIR    . knee replacement- both    . MELANOMA EXCISION    . NOSE SURGERY    . RENAL ANGIOGRAPHY N/A 05/07/2016   Procedure: Renal Angiography;  Surgeon: Algernon Huxley, MD;  Location: [facility];  Service: Cardiovascular;  Laterality: N/A;  . ROTATOR CUFF REPAIR  left  . SHOULDER SURGERY    . TONSILLECTOMY    . TOTAL ABDOMINAL HYSTERECTOMY      Current Outpatient Prescriptions  Medication Sig Dispense Refill  . albuterol (PROVENTIL HFA;VENTOLIN HFA) 108 (90 BASE) MCG/ACT inhaler Inhale 1-2 puffs into the lungs every 6 (six) hours as needed for wheezing or shortness of breath. 1 Inhaler 6  . allopurinol  (ZYLOPRIM) 100 MG tablet Take 100 mg by mouth 2 (two) times daily.    Marland Kitchen aspirin EC 81 MG tablet Take 81 mg by mouth daily.    Marland Kitchen atorvastatin (LIPITOR) 20 MG tablet Take 20 mg by mouth at bedtime.     . Calcium Carbonate-Vitamin D (CALTRATE 600+D) 600-400 MG-UNIT per tablet Take 1 tablet by mouth daily.      . carvedilol (COREG) 3.125 MG tablet Take 3.125 mg by mouth 2 (two) times daily.    . cetirizine (ZYRTEC) 10 MG chewable tablet Chew 10 mg by mouth daily.      . cloNIDine (CATAPRES) 0.2 MG tablet Take 0.2 mg by mouth 3 (three) times daily.     . clopidogrel (PLAVIX) 75 MG tablet Take 1 tablet (75 mg total) by mouth daily. 30 tablet 2  . docusate sodium (COLACE) 100 MG capsule Take 1 capsule (100 mg total) by mouth daily as needed. 30 capsule 0  . ferrous sulfate 325 (65 FE) MG tablet Take 325 mg by mouth daily with breakfast.    . fluticasone (FLONASE) 50 MCG/ACT nasal spray Place 2 sprays into both nostrils at bedtime as needed for allergies or rhinitis. 16 g 0  . furosemide (LASIX) 20 MG tablet Take 20 mg by mouth every other day. Takes 1 tab on Monday, Wed, and Friday    . hydrALAZINE (APRESOLINE) 25 MG tablet Take 1 tablet (25 mg total) by mouth every 8 (eight) hours. 60 tablet 1  . Multiple Vitamin (MULTIVITAMIN) tablet Take 1 tablet by mouth daily.    . pantoprazole (PROTONIX) 40 MG tablet Take 40 mg by mouth at bedtime.  30 tablet 11  . Probiotic Product (PHILLIPS COLON HEALTH PO) Take 1 capsule by mouth daily.     No current facility-administered medications for this visit.      Allergies:   Oxycodone; Codeine; Hydrocodone-acetaminophen; Iodine; Meperidine hcl; Other; and Propoxyphene n-acetaminophen   Social History:  The patient  reports that she has never smoked. She has never used smokeless tobacco. She reports that she drinks about 1.8 oz of alcohol per week . She reports that she does not use drugs.   Family History:   family history includes Heart failure in her father;  Liver cancer in her father; Lung cancer in her brother and sister; Ovarian cancer in her mother; Prostate cancer in her brother.    Review of Systems: Review of Systems  Constitutional: Negative.   Respiratory: Positive for shortness of breath.   Cardiovascular: Negative.   Gastrointestinal: Negative.   Musculoskeletal: Negative.   Skin:       Leg bruising  Neurological: Negative.   Psychiatric/Behavioral: Negative.   All other systems reviewed and are negative.    PHYSICAL EXAM: VS:  BP (!) 115/50 (BP Location: Left Arm, Patient Position: Sitting, Cuff Size: Normal)   Pulse 60   Ht 5\' 2"  (1.575 m)   Wt 115 lb 8 oz (52.4 kg)   BMI 21.13 kg/m  , BMI Michael mass index is 21.13 kg/m. GEN: Well nourished, well developed, in no acute distress  HEENT: normal  Neck: no JVD, carotid bruits, or masses Cardiac: RRR; 3/6 SEM RSB, no rubs, or gallops,Trace nonpitting edema  Respiratory:  clear to auscultation bilaterally, normal work of breathing GI: soft, nontender, nondistended, + BS Joanne: no deformity or atrophy  Skin: Large region of ecchymosis and blistering left lower extremity Neuro:  Strength and sensation are intact Psych: euthymic mood, full affect    Recent Labs: 10/14/2015: ALT 9 05/05/2016: Hemoglobin 10.8; Platelets 194 05/08/2016: BUN 26; Creatinine, Ser 1.71; Potassium 3.1; Sodium 137    Lipid Panel Lab Results  Component Value Date   CHOL 118 05/06/2016   HDL 46 05/06/2016   LDLCALC 54 05/06/2016   TRIG 89 05/06/2016      Wt Readings from Last 3 Encounters:  05/25/16 115 lb 8 oz (52.4 kg)  05/05/16 119 lb 3.2 oz (54.1 kg)  05/03/16 118 lb (53.5 kg)       ASSESSMENT AND PLAN:  Mixed hyperlipidemia - Plan: EKG 12-Lead, ECHOCARDIOGRAM COMPLETE Cholesterol is at goal on the current lipid regimen. No changes to the medications were made.  Essential hypertension, benign - Plan: EKG 12-Lead, ECHOCARDIOGRAM COMPLETE Blood pressure running low after recent  renal artery stenting Recommended she stop her Cardura If blood pressure continues to run low we could hold the hydralazine  Aortic valve stenosis, etiology of cardiac valve disease unspecified - Plan: EKG 12-Lead, ECHOCARDIOGRAM COMPLETE Repeat echocardiogram ordered given more prominent murmur on exam 2 years ago was mild aortic valve stenosis  Chronic renal impairment, stage 2 (mild) - Plan: EKG 12-Lead, ECHOCARDIOGRAM COMPLETE Followed by nephrology  Renal artery stenosis (HCC) Recent stent placement , on aspirin and Plavix  Contusion of left lower leg, subsequent encounter Recommended she take extreme precautions given the blistered hematoma on the left Consider wrapping likely for protection with gauze wrap   Disposition:   F/U  6 months   Total encounter time more than 25 minutes  Greater than 50% was spent in counseling and coordination of care with the patient    Orders Placed This Encounter  Procedures  . EKG 12-Lead  . ECHOCARDIOGRAM COMPLETE     Signed, Esmond Plants, M.D., Ph.D. 05/25/2016  [place], [place]

## 2016-05-25 ENCOUNTER — Ambulatory Visit (INDEPENDENT_AMBULATORY_CARE_PROVIDER_SITE_OTHER): Payer: Medicare Other | Admitting: Cardiovascular Disease

## 2016-05-25 ENCOUNTER — Encounter: Payer: Self-pay | Admitting: Cardiovascular Disease

## 2016-05-25 VITALS — BP 115/50 | HR 60 | Ht 62.0 in | Wt 115.5 lb

## 2016-05-25 DIAGNOSIS — I350 Nonrheumatic aortic (valve) stenosis: Secondary | ICD-10-CM

## 2016-05-25 DIAGNOSIS — S8012XD Contusion of left lower leg, subsequent encounter: Secondary | ICD-10-CM

## 2016-05-25 DIAGNOSIS — I10 Essential (primary) hypertension: Secondary | ICD-10-CM | POA: Diagnosis not present

## 2016-05-25 DIAGNOSIS — I701 Atherosclerosis of renal artery: Secondary | ICD-10-CM

## 2016-05-25 DIAGNOSIS — E782 Mixed hyperlipidemia: Secondary | ICD-10-CM | POA: Diagnosis not present

## 2016-05-25 DIAGNOSIS — N182 Chronic kidney disease, stage 2 (mild): Secondary | ICD-10-CM

## 2016-05-25 NOTE — Patient Instructions (Addendum)
Medication Instructions:   Please hold the doxazosin/cardura Blood pressure is low Monitor your blood pressure at home  Labwork:  No new labs needed  Testing/Procedures:  We will order an echocardiogram for aortic valve stenosis, murmur Your physician has requested that you have an echocardiogram. Echocardiography is a painless test that uses sound waves to create images of your heart. It provides your doctor with information about the size and shape of your heart and how well your heart's chambers and valves are working. This procedure takes approximately one hour. There are no restrictions for this procedure.   I recommend watching educational videos on topics of interest to you at:       [email or web address]  Enter code: [email or web address]    Follow-Up: It was a pleasure seeing you in the office today. Please call us if you have new issues that need to be addressed before your next appt.  [phone]  Your physician wants you to follow-up in: 6 months.  You will receive a reminder letter in the mail two months in advance. If you don't receive a letter, please call our office to schedule the follow-up appointment.  If you need a refill on your cardiac medications before your next appointment, please call your pharmacy.    Echocardiogram An echocardiogram, or echocardiography, uses sound waves (ultrasound) to produce an image of your heart. The echocardiogram is simple, painless, obtained within a short period of time, and offers valuable information to your health care provider. The images from an echocardiogram can provide information such as:  Evidence of coronary artery disease (CAD).  Heart size.  Heart muscle function.  Heart valve function.  Aneurysm detection.  Evidence of a past heart attack.  Fluid buildup around the heart.  Heart muscle thickening.  Assess heart valve function. Tell a health care provider about:  Any allergies you have.  All medicines you are  taking, including vitamins, herbs, eye drops, creams, and over-the-counter medicines.  Any problems you or family members have had with anesthetic medicines.  Any blood disorders you have.  Any surgeries you have had.  Any medical conditions you have.  Whether you are pregnant or may be pregnant. What happens before the procedure? No special preparation is needed. Eat and drink normally. What happens during the procedure?  In order to produce an image of your heart, gel will be applied to your chest and a wand-like tool (transducer) will be moved over your chest. The gel will help transmit the sound waves from the transducer. The sound waves will harmlessly bounce off your heart to allow the heart images to be captured in real-time motion. These images will then be recorded.  You may need an IV to receive a medicine that improves the quality of the pictures. What happens after the procedure? You may return to your normal schedule including diet, activities, and medicines, unless your health care provider tells you otherwise. This information is not intended to replace advice given to you by your health care provider. Make sure you discuss any questions you have with your health care provider. Document Released: 02/10/2000 Document Revised: 10/01/2015 Document Reviewed: 10/20/2012 Elsevier Interactive Patient Education  2017 [employer].

## 2016-06-04 ENCOUNTER — Other Ambulatory Visit: Payer: Self-pay | Admitting: Cardiovascular Disease

## 2016-06-04 DIAGNOSIS — I350 Nonrheumatic aortic (valve) stenosis: Secondary | ICD-10-CM

## 2016-06-05 ENCOUNTER — Other Ambulatory Visit: Payer: Self-pay | Admitting: Pain Medicine

## 2016-06-05 DIAGNOSIS — M792 Neuralgia and neuritis, unspecified: Secondary | ICD-10-CM

## 2016-07-04 ENCOUNTER — Other Ambulatory Visit: Payer: Medicare Other

## 2016-07-11 ENCOUNTER — Other Ambulatory Visit: Payer: Self-pay | Admitting: Pain Medicine

## 2016-07-11 DIAGNOSIS — M792 Neuralgia and neuritis, unspecified: Secondary | ICD-10-CM

## 2016-07-23 IMAGING — CT CT RENAL STONE PROTOCOL
1 of 2 series · 15 of 32 positions shown, 19 images · non-contrast
Comparison: 09/05/2012

CLINICAL DATA: Confusion, urinary frequency, recent UTI

EXAM:
CT ABDOMEN AND PELVIS WITHOUT CONTRAST
TECHNIQUE: Multidetector CT imaging of the abdomen and pelvis was performed
following the standard protocol without IV contrast.

[Series 2: stone standard full · axial · 0.67mm/px · z∈[-1020,-674]mm · 15 of 77 slices shown, 19 images]
[im 4/77  soft-tissue]
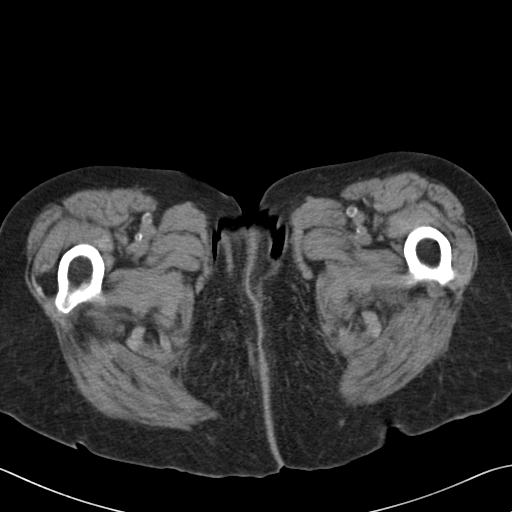
[im 4/77  bone]
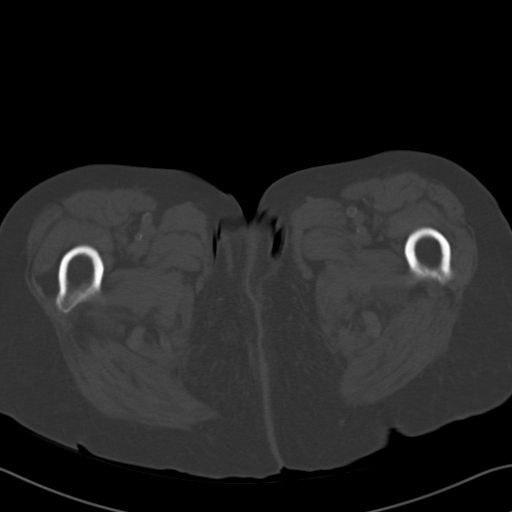
[im 10/77  soft-tissue]
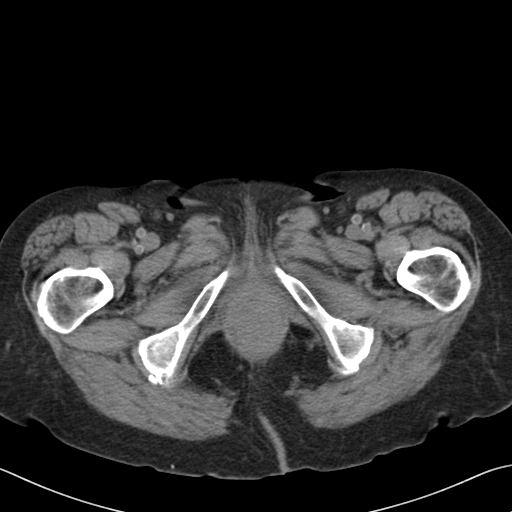
[im 16/77  soft-tissue]
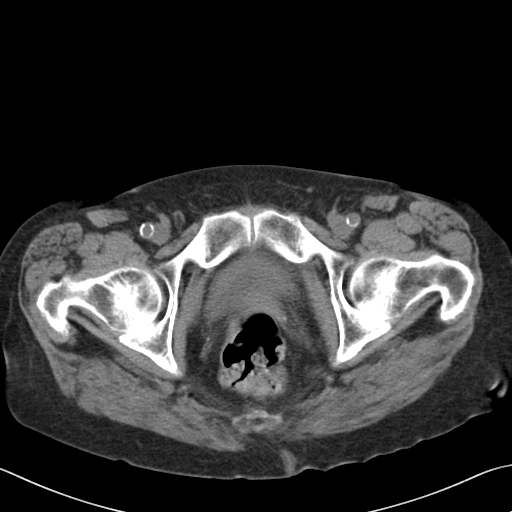
[im 23/77  soft-tissue]
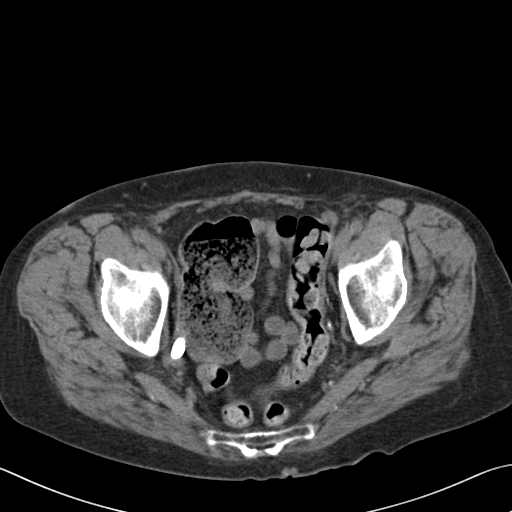
[im 26/77  soft-tissue]
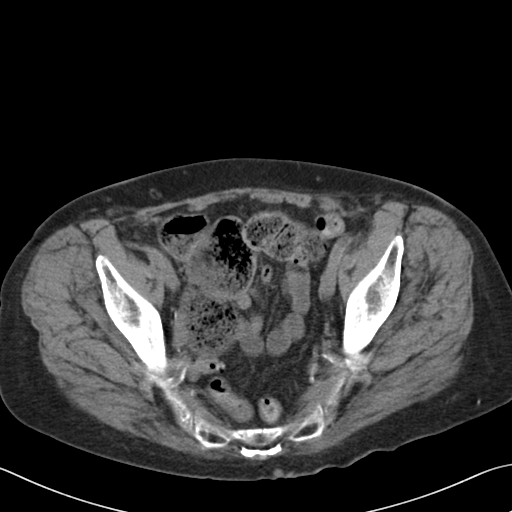
[im 32/77  soft-tissue]
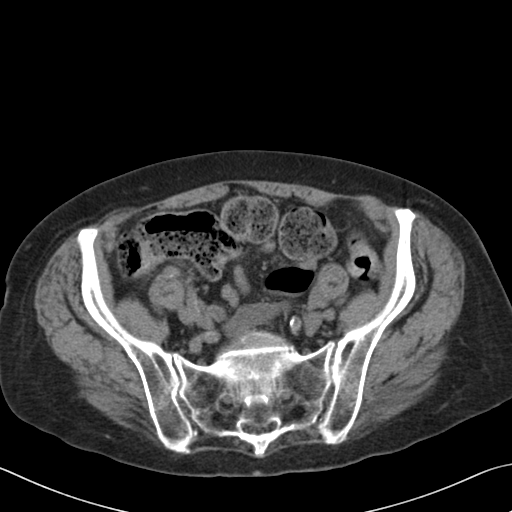
[im 39/77  soft-tissue]
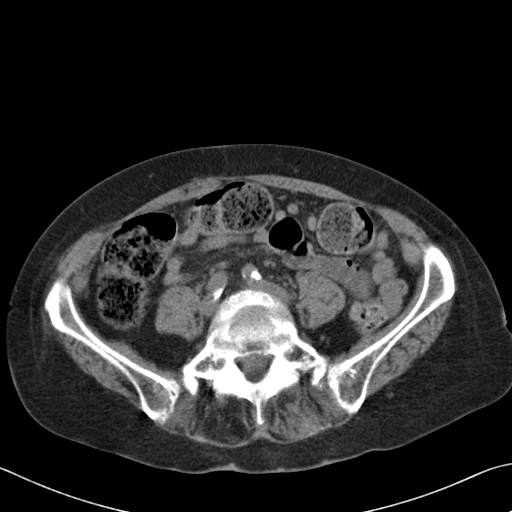
[im 45/77  soft-tissue]
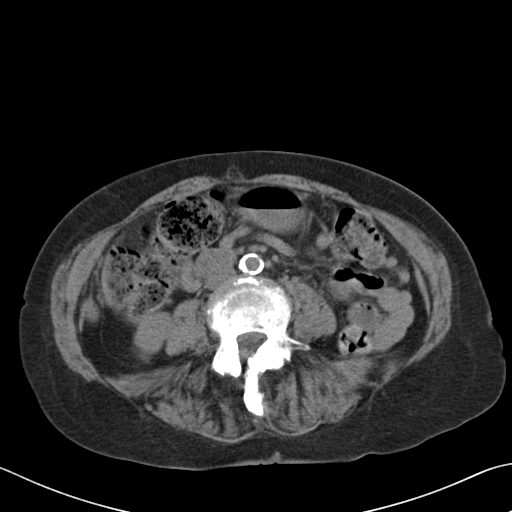
[im 51/77  soft-tissue]
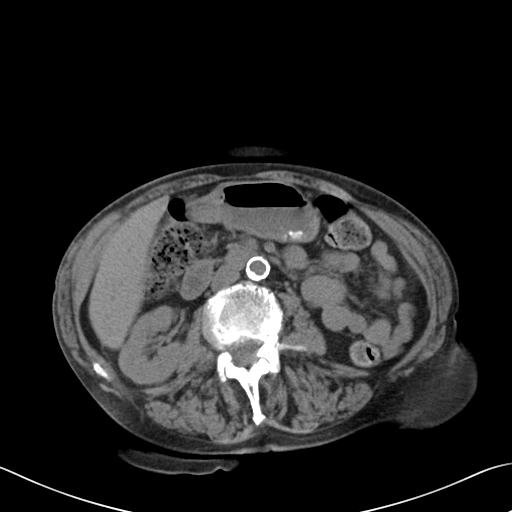
[im 51/77  bone]
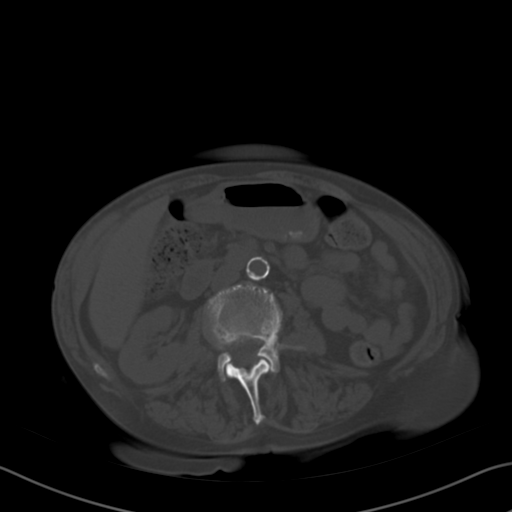
[im 54/77  soft-tissue]
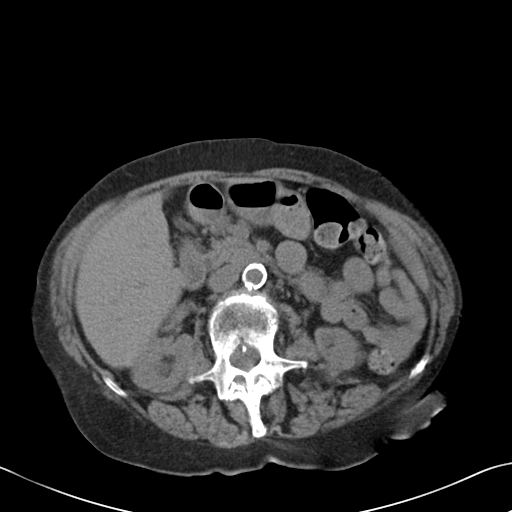
[im 61/77  soft-tissue]
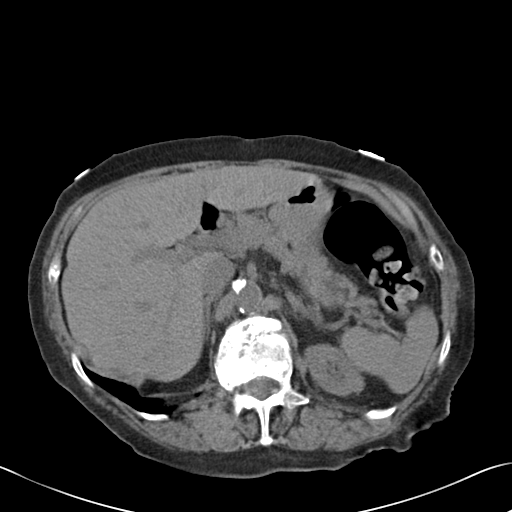
[im 64/77  lung]
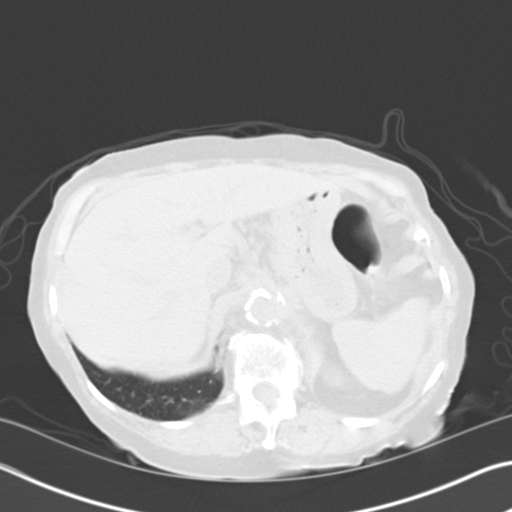
[im 67/77  soft-tissue]
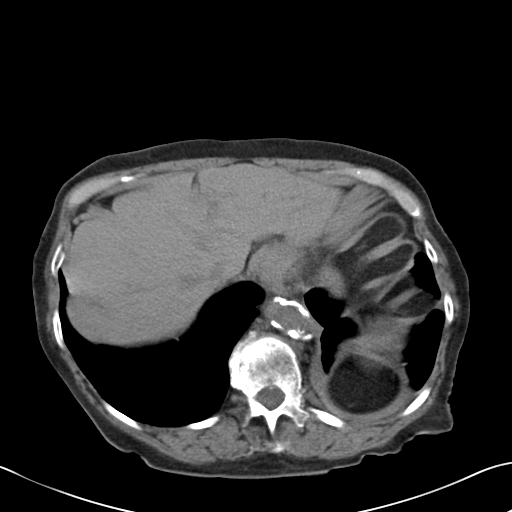
[im 67/77  lung]
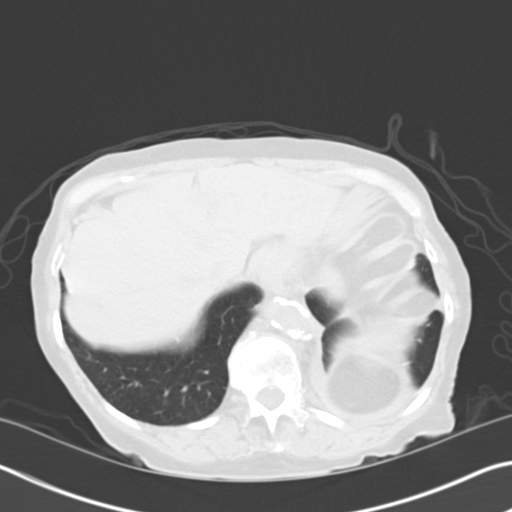
[im 70/77  lung]
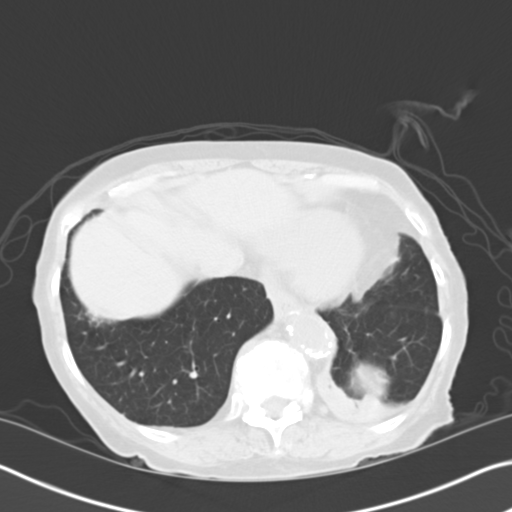
[im 73/77  soft-tissue]
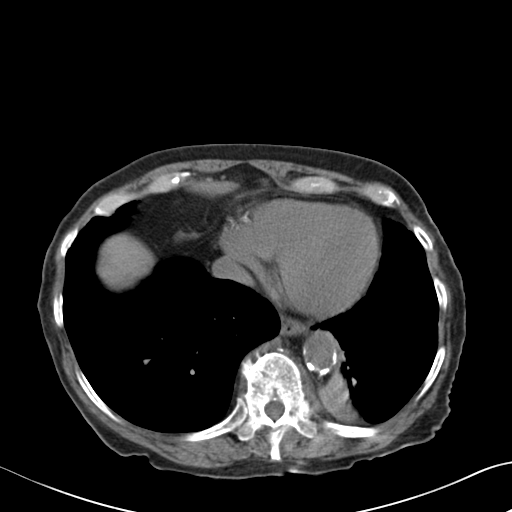
[im 73/77  lung]
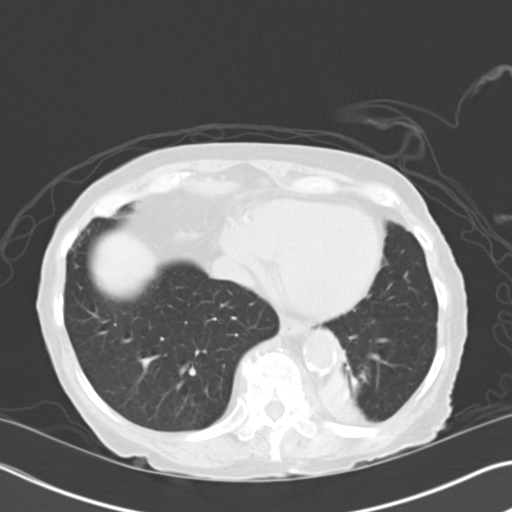

[15 of 32 positions shown; findings below may reference images not displayed]

FINDINGS: Lower chest: Mild patchy medial left lower lobe opacity, atelectasis
versus pneumonia.

Hepatobiliary: Unenhanced liver is unremarkable.

Status post cholecystectomy. No intrahepatic or extrahepatic ductal
dilatation.

Pancreas: Within normal limits.

Spleen: Within normal limits.

Adrenals/Urinary Tract: Adrenal glands are within normal limits.

Mild renal cortical atrophy, left greater than right.

Suspected 2 mm nonobstructing medial left lower pole renal calculus
(series 5/ image 69), unchanged since 5971. No ureteral or bladder
calculi. No hydronephrosis.

Mildly thick-walled bladder.

Stomach/Bowel: Stomach is within normal limits.

No evidence of bowel obstruction.

Moderate right colonic stool burden.

Vascular/Lymphatic: Atherosclerotic calcifications of the abdominal
aorta and branch vessels.

No evidence of abdominal aortic aneurysm.

No suspicious abdominopelvic lymphadenopathy.

Reproductive: Status post hysterectomy.

No adnexal masses.

Other: No abdominopelvic ascites.

Musculoskeletal: Degenerative changes of the visualized
thoracolumbar spine.
IMPRESSION: Suspected 2 mm nonobstructing left lower pole renal calculus. No
ureteral or bladder calculi. No hydronephrosis.

Mildly thick-walled bladder, correlate for cystitis.

No evidence of bowel obstruction. Moderate right colonic stool
burden, suggesting constipation.

## 2016-07-26 ENCOUNTER — Encounter: Payer: Medicare Other | Attending: Surgery | Admitting: Surgery

## 2016-07-26 DIAGNOSIS — L97222 Non-pressure chronic ulcer of left calf with fat layer exposed: Secondary | ICD-10-CM | POA: Insufficient documentation

## 2016-07-26 DIAGNOSIS — M1990 Unspecified osteoarthritis, unspecified site: Secondary | ICD-10-CM | POA: Diagnosis not present

## 2016-07-26 DIAGNOSIS — Z885 Allergy status to narcotic agent status: Secondary | ICD-10-CM | POA: Diagnosis not present

## 2016-07-26 DIAGNOSIS — Z888 Allergy status to other drugs, medicaments and biological substances status: Secondary | ICD-10-CM | POA: Insufficient documentation

## 2016-07-26 DIAGNOSIS — D649 Anemia, unspecified: Secondary | ICD-10-CM | POA: Diagnosis not present

## 2016-07-26 DIAGNOSIS — M109 Gout, unspecified: Secondary | ICD-10-CM | POA: Diagnosis not present

## 2016-07-26 DIAGNOSIS — I4891 Unspecified atrial fibrillation: Secondary | ICD-10-CM | POA: Insufficient documentation

## 2016-07-26 DIAGNOSIS — I129 Hypertensive chronic kidney disease with stage 1 through stage 4 chronic kidney disease, or unspecified chronic kidney disease: Secondary | ICD-10-CM | POA: Insufficient documentation

## 2016-07-26 DIAGNOSIS — Z880 Allergy status to penicillin: Secondary | ICD-10-CM | POA: Diagnosis not present

## 2016-07-26 DIAGNOSIS — I890 Lymphedema, not elsewhere classified: Secondary | ICD-10-CM | POA: Insufficient documentation

## 2016-07-26 DIAGNOSIS — Z96659 Presence of unspecified artificial knee joint: Secondary | ICD-10-CM | POA: Diagnosis not present

## 2016-07-26 DIAGNOSIS — N182 Chronic kidney disease, stage 2 (mild): Secondary | ICD-10-CM | POA: Insufficient documentation

## 2016-07-26 DIAGNOSIS — M4800 Spinal stenosis, site unspecified: Secondary | ICD-10-CM | POA: Diagnosis not present

## 2016-07-26 DIAGNOSIS — Z79899 Other long term (current) drug therapy: Secondary | ICD-10-CM | POA: Insufficient documentation

## 2016-07-27 NOTE — Progress Notes (Addendum)
Joanne Michael, Joanne Michael ([id number]) Visit Report for 07/26/2016 Chief Complaint Document Details Patient Name: Joanne Michael, Joanne Michael. Date of Service: 07/26/2016 8:00 AM Medical Record Number: [id number] Patient Account Number: [id number] Date of Birth/Sex: May 21, 1924 (83 y.Michael. Female) Treating RN: Montey Hora Primary Care Provider: Dion Body Other Clinician: Referring Provider: Mortimer Fries Treating Provider/Extender: Frann Rider in Treatment: 0 Information Obtained from: Patient Chief Complaint Patient presents to the wound care center due with non-wound condition(s) the left lower extremity which she's had for 2 months Electronic Signature(s) Signed: 07/26/2016 12:15:47 PM By: Christin Fudge MD, FACS Entered By: Christin Fudge on 07/26/2016 12:15:47 Joanne Michael ([id number]) -------------------------------------------------------------------------------- Debridement Details Patient Name: Joanne Michael Date of Service: 07/26/2016 8:00 AM Medical Record Number: [id number] Patient Account Number: [id number] Date of Birth/Sex: [birth year] (83 y.Michael. Female) Treating RN: Montey Hora Primary Care Provider: Dion Body Other Clinician: Referring Provider: Mortimer Fries Treating Provider/Extender: Frann Rider in Treatment: 0 Debridement Performed for Wound #1 Left,Lateral Lower Leg Assessment: Performed By: Physician Christin Fudge, MD Debridement: Debridement Pre-procedure Verification/Time Out Yes - 08:46 Taken: Start Time: 08:46 Pain Control: Lidocaine 4% Topical Solution Level: Skin/Subcutaneous Tissue Total Area Debrided (L x 2.5 (cm) x 3.7 (cm) = 9.25 (cm) W): Tissue and other Viable, Non-Viable, Eschar, Fibrin/Slough, Subcutaneous material debrided: Instrument: Forceps, Scissors Bleeding: Minimum Hemostasis Achieved: Pressure End Time: 08:51 Procedural Pain: 0 Post Procedural Pain: 0 Response to Treatment: Procedure was tolerated  well Post Debridement Measurements of Total Wound Length: (cm) 2.5 Width: (cm) 3.7 Depth: (cm) 0.2 Volume: (cm) 1.453 Character of Wound/Ulcer Post Improved Debridement: Post Procedure Diagnosis Same as Pre-procedure Electronic Signature(s) Signed: 07/26/2016 12:15:30 PM By: Christin Fudge MD, FACS Signed: 07/26/2016 4:52:26 PM By: Montey Hora Entered By: Christin Fudge on 07/26/2016 12:15:30 Joanne Michael ([id number]) -------------------------------------------------------------------------------- HPI Details Patient Name: Joanne Michael Date of Service: 07/26/2016 8:00 AM Medical Record Number: [id number] Patient Account Number: [id number] Date of Birth/Sex: [birth year] (83 y.Michael. Female) Treating RN: Montey Hora Primary Care Provider: Dion Body Other Clinician: Referring Provider: Mortimer Fries Treating Provider/Extender: Frann Rider in Treatment: 0 History of Present Illness Location: Patient presents with an ulcer on the left lower extremity in the anterior lateral calf area Quality: Patient reports experiencing a sharp pain to affected area(s). Severity: Patient states wound are getting worse. Duration: Patient has had the wound for > 2 months prior to seeking treatment at the wound center Timing: Pain in wound is Intermittent (comes and goes Context: The wound occurred when the patient had a blunt trauma to the legs and had a large swelling Modifying Factors: Other treatment(s) tried include:local care with antibiotic ointment Associated Signs and Symptoms: Patient reports having increase swelling. HPI Description: 83 year old patient referred to [place] by Ardyth Man, PA, for a ulcer on the left lower extremity, with cellulitis caused by trauma 2 months ago. past medical history of atrial fibrillation, chronic renal failure stage II, essential hypertension, and deficiency anemia, osteoporosis and spinal stenosis. She is status post appendectomy,  gallbladder surgery, hernia repair, hysterectomy, knee replacement. She was recently placed on doxycycline. Electronic Signature(s) Signed: 07/26/2016 12:17:07 PM By: Christin Fudge MD, FACS Previous Signature: 07/26/2016 8:40:33 AM Version By: Christin Fudge MD, FACS Previous Signature: 07/26/2016 8:32:12 AM Version By: Christin Fudge MD, FACS Previous Signature: 07/26/2016 8:21:32 AM Version By: Christin Fudge MD, FACS Entered By: Christin Fudge on 07/26/2016 12:17:06 Joanne Michael ([id number]) -------------------------------------------------------------------------------- Physical Exam Details Patient Name: Joanne Michael Date of Service: 07/26/2016 8:00 AM Medical Record  Number: [id number] Patient Account Number: [id number] Date of Birth/Sex: 1924-03-02 (83 y.Michael. Female) Treating RN: Montey Hora Primary Care Provider: Dion Body Other Clinician: Referring Provider: Mortimer Fries Treating Provider/Extender: Frann Rider in Treatment: 0 Constitutional . Pulse regular. Respirations normal and unlabored. Afebrile. . Eyes Nonicteric. Reactive to light. Ears, Nose, Mouth, and Throat Lips, teeth, and gums WNL.Marland Kitchen Moist mucosa without lesions. Neck supple and nontender. No palpable supraclavicular or cervical adenopathy. Normal sized without goiter. Respiratory WNL. No retractions.. Cardiovascular Pedal Pulses WNL. noncompressible blood vessels no ABI could be measured. No clubbing, cyanosis but has stage I lymphedema bilaterally.. Chest Breasts symmetical and no nipple discharge.. Breast tissue WNL, no masses, lumps, or tenderness.. Gastrointestinal (GI) Abdomen without masses or tenderness.. No liver or spleen enlargement or tenderness.. Lymphatic No adneopathy. No adenopathy. No adenopathy. Musculoskeletal Adexa without tenderness or enlargement.. Digits and nails w/Michael clubbing, cyanosis, infection, petechiae, ischemia, or inflammatory conditions.. Integumentary  (Hair, Skin) No suspicious lesions. No crepitus or fluctuance. No peri-wound warmth or erythema. No masses.Marland Kitchen Psychiatric Judgement and insight Intact.. No evidence of depression, anxiety, or agitation.. Notes she has a large thick eschar which is raised and adherent to the ulcerated area on her left lower lateral calf. Sharp debridement was done with forceps and scissors and the entire scar was removed and some of the subcutaneous debris. No bleeding Electronic Signature(s) Signed: 07/26/2016 12:18:11 PM By: Christin Fudge MD, FACS Entered By: Christin Fudge on 07/26/2016 12:18:11 Joanne Michael ([id number]) Joanne Michael, Joanne Michael ([id number]) -------------------------------------------------------------------------------- Physician Orders Details Patient Name: Joanne Michael Date of Service: 07/26/2016 8:00 AM Medical Record Number: [id number] Patient Account Number: [id number] Date of Birth/Sex: 1924-04-10 (83 y.Michael. Female) Treating RN: Montey Hora Primary Care Provider: Dion Body Other Clinician: Referring Provider: Mortimer Fries Treating Provider/Extender: Frann Rider in Treatment: 0 Verbal / Phone Orders: No Diagnosis Coding Wound Cleansing Wound #1 Left,Lateral Lower Leg Michael Clean wound with Normal Saline. Michael Cleanse wound with mild soap and water Anesthetic Wound #1 Left,Lateral Lower Leg Michael Topical Lidocaine 4% cream applied to wound bed prior to debridement Primary Wound Dressing Wound #1 Left,Lateral Lower Leg Michael Santyl Ointment Secondary Dressing Wound #1 Left,Lateral Lower Leg Michael Gauze, ABD and Kerlix/Conform Dressing Change Frequency Wound #1 Left,Lateral Lower Leg Michael Change dressing every day. Follow-up Appointments Wound #1 Left,Lateral Lower Leg Michael Return Appointment in 1 week. - on Monday Edema Control Wound #1 Left,Lateral Lower Leg Michael Elevate legs to the level of the heart and pump ankles as often as possible Michael Compression  Pump: Use compression pump on left lower extremity for 30 minutes, twice daily. Michael Compression Pump: Use compression pump on right lower extremity for 30 minutes, twice daily. Additional Orders / Instructions Wound #1 Left,Lateral Lower Leg Joanne Michael, Joanne Michael. ([id number]) Michael Increase protein intake. Michael Other: - Please add vitamin A, vitamin C and zinc supplements to your diet Medications-please add to medication list. Wound #1 Left,Lateral Lower Leg Michael Santyl Enzymatic Ointment Patient Medications Allergies: silicone, adhesive, oxycodone HCl, amlodipine, amoxicillin, codeine, Darvocet-N, Demerol, iodine, meperidine, Vicodin Notifications Medication Indication Start End Santyl 07/26/2016 DOSE topical 250 unit/gram ointment - ointment topical as directed Electronic Signature(s) Signed: 07/26/2016 12:15:00 PM By: Christin Fudge MD, FACS Entered By: Christin Fudge on 07/26/2016 12:15:00 Joanne Michael ([id number]) -------------------------------------------------------------------------------- Problem List Details Patient Name: Joanne Michael Date of Service: 07/26/2016 8:00 AM Medical Record Number: [id number] Patient Account Number: [id number] Date of Birth/Sex: [birth year] (83 y.Michael. 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 Female) Treating RN: Montey Hora Primary Care Provider: Dion Body Other Clinician: Referring Provider: Mortimer Fries Treating Provider/Extender: Frann Rider in Treatment: 0 Subjective Chief Complaint Information obtained from Patient Patient presents to the wound care center due with non-wound condition(s) the left lower extremity which she's had for 2 months History of Present Illness (HPI) The following HPI elements were documented for the patient's wound: Location: Patient presents with an ulcer on the left lower extremity in the anterior lateral calf area Quality: Patient reports experiencing a sharp pain to affected area(s). Severity: Patient states wound are getting worse. Duration: Patient has had the wound for > 2 months prior to seeking treatment at the wound center Timing: Pain in wound is Intermittent (comes and goes Context: The wound occurred when the patient had a blunt trauma to the legs and had a large swelling Modifying Factors: Other treatment(s) tried include:local care with antibiotic ointment Associated Signs and Symptoms: Patient reports having increase swelling. 83 year old patient referred to [place] by Ardyth Man, PA, for a ulcer on the left lower extremity, with cellulitis caused by trauma 2 months ago. past medical history of atrial fibrillation, chronic renal failure stage II, essential hypertension, and deficiency anemia, osteoporosis and spinal stenosis. She is status post appendectomy, gallbladder surgery, hernia repair, hysterectomy, knee replacement. She was recently placed on doxycycline. Wound History Patient presents with 1 open wound that has been present for approximately 2-3 months. Patient has been treating wound in the following manner: open to  air. The wound has been healed in the past but has re- opened. Laboratory tests have not been performed in the last month. Patient reportedly has not tested positive for an antibiotic resistant organism. Patient reportedly has not tested positive for osteomyelitis. Patient reportedly has not had testing performed to evaluate circulation in the legs. Patient History Information obtained from Patient. Allergies silicone, adhesive, oxycodone HCl, amlodipine, amoxicillin, codeine, Darvocet-N, Demerol, iodine, meperidine, Vicodin Joanne Michael, Joanne Michael ([id number]) Social History Never smoker, Marital Status - Widowed, Alcohol Use - Never, Drug Use - No History, Caffeine Use - Daily. Medical History Eyes Denies history of Cataracts, Glaucoma, Optic Neuritis Ear/Nose/Mouth/Throat Denies history of Chronic sinus problems/congestion, Middle ear problems Hematologic/Lymphatic Denies history of Anemia, Hemophilia, Human Immunodeficiency Virus, Lymphedema, Sickle Cell Disease Respiratory Denies history of Aspiration, Asthma, Chronic Obstructive Pulmonary Disease (COPD), Pneumothorax, Sleep Apnea, Tuberculosis Cardiovascular Patient has history of Arrhythmia - a fib, Hypertension Denies history of Angina, Congestive Heart Failure, Coronary Artery Disease, Deep Vein Thrombosis, Hypotension, Myocardial Infarction, Peripheral Arterial Disease, Peripheral Venous Disease, Phlebitis, Vasculitis Gastrointestinal Denies history of Cirrhosis , Colitis, Crohn s, Hepatitis A, Hepatitis B, Hepatitis C Endocrine Denies history of Type I Diabetes, Type II Diabetes Immunological Denies history of Lupus Erythematosus, Raynaud s, Scleroderma Integumentary (Skin) Denies history of History of Burn, History of pressure wounds Musculoskeletal Patient has history of Gout, Osteoarthritis Denies history of Rheumatoid Arthritis, Osteomyelitis Neurologic Denies  history of Dementia, Neuropathy, Quadriplegia, Paraplegia,  Seizure Disorder Oncologic Denies history of Received Chemotherapy, Received Radiation Psychiatric Denies history of Anorexia/bulimia, Confinement Anxiety Medical And Surgical History Notes Respiratory mycobacterial pulmonary disease Cardiovascular varicose veins Review of Systems (ROS) Constitutional Symptoms (General Health) The patient has no complaints or symptoms. Eyes Complains or has symptoms of Glasses / Contacts - glasses. Ear/Nose/Mouth/Throat The patient has no complaints or symptoms. Hematologic/Lymphatic The patient has no complaints or symptoms. Joanne Michael, Joanne Michael ([id number]) Respiratory The patient has no complaints or symptoms. Cardiovascular Complains or has symptoms of LE edema. Denies complaints or symptoms of Chest pain. Gastrointestinal Denies complaints or symptoms of Frequent diarrhea, Nausea, Vomiting. Endocrine Denies complaints or symptoms of Hepatitis, Thyroid disease, Polydypsia (Excessive Thirst). Genitourinary Complains or has symptoms of Kidney failure/ Dialysis - CKD stage 2. Immunological Denies complaints or symptoms of Hives, Itching. Integumentary (Skin) Denies complaints or symptoms of Wounds, Bleeding or bruising tendency, Breakdown, Swelling. Musculoskeletal Denies complaints or symptoms of Muscle Pain, Muscle Weakness. Neurologic The patient has no complaints or symptoms. Oncologic The patient has no complaints or symptoms. Psychiatric The patient has no complaints or symptoms. Medications carvedilol 3.125 mg tablet oral 1 1 tablet oral two times daily Hardin Negus' Colon Health 1.5 billion cell capsule oral 1 1 capsule oral daily cetirizine 10 mg tablet oral 1 1 tablet oral daily atorvastatin 20 mg tablet oral 1 1 tablet oral nightly clonidine HCl 0.2 mg tablet oral 1 1 tablet oral three times daily hydralazine 25 mg tablet oral 1 1 tablet oral every eight hours PreserVision AREDS 7,160 unit-113 mg-100 unit tablet oral 1 1 tablet  oral two times daily albuterol sulfate HFA 90 mcg/actuation aerosol inhaler inhalation 1 1 HFA aerosol inhaler inhalation every four hours as needed calcium carbonate 600 mg(1,500 mg)-vitamin D3 800 unit chewable tablet oral 1 1 tablet,chewable oral daily allopurinol 100 mg tablet oral 1 1 tablet oral two times daily ferrous sulfate 325 mg (65 mg iron) tablet oral 1 1 tablet oral daily furosemide 20 mg tablet oral 1 1 tablet oral on Monday, Wednesday, Friday multivitamin tablet oral 1 1 tablet oral daily fluticasone 50 mcg/actuation nasal spray,suspension nasal 2 2 sprays, suspension nasal into both nostrils once daily as needed clopidogrel 75 mg tablet oral 1 1 tablet oral daily clopidogrel 75 mg tablet oral 1 1 tablet oral daily pantoprazole 40 mg tablet,delayed release oral 1 1 tablet,delayed release (DR/EC) oral daily Santyl 250 unit/gram topical ointment topical ointment topical as directed CLAUDETTE, WERMUTH. ([id number]) ascorbic acid (vitamin C) 1,000 mg tablet oral 1 1 tablet oral daily Objective Constitutional Pulse regular. Respirations normal and unlabored. Afebrile. Vitals Time Taken: 8:15 AM, Height: 60 in, Source: Measured, Weight: 116 lbs, Source: Measured, BMI: 22.7, Temperature: 97.5 F, Pulse: 53 bpm, Respiratory Rate: 18 breaths/min, Blood Pressure: 138/47 mmHg. Eyes Nonicteric. Reactive to light. Ears, Nose, Mouth, and Throat Lips, teeth, and gums WNL.Marland Kitchen Moist mucosa without lesions. Neck supple and nontender. No palpable supraclavicular or cervical adenopathy. Normal sized without goiter. Respiratory WNL. No retractions.. Cardiovascular Pedal Pulses WNL. noncompressible blood vessels no ABI could be measured. No clubbing, cyanosis but has stage I lymphedema bilaterally.. Chest Breasts symmetical and no nipple discharge.. Breast tissue WNL, no masses, lumps, or tenderness.. Gastrointestinal (GI) Abdomen without masses or tenderness.. No liver or spleen  enlargement or tenderness.. Lymphatic No adneopathy. No adenopathy. No adenopathy. Musculoskeletal Adexa without tenderness or enlargement.. Digits and nails w/Michael clubbing, cyanosis, infection, petechiae, ischemia, or inflammatory conditions.Marland Kitchen Psychiatric Judgement and insight  Intact.. No evidence of depression, anxiety, or agitation.. General Notes: she has a large thick eschar which is raised and adherent to the ulcerated area on her left Joanne Michael, Joanne Michael. ([id number]) lower lateral calf. Sharp debridement was done with forceps and scissors and the entire scar was removed and some of the subcutaneous debris. No bleeding Integumentary (Hair, Skin) No suspicious lesions. No crepitus or fluctuance. No peri-wound warmth or erythema. No masses.. Wound #1 status is Open. Original cause of wound was Trauma. The wound is located on the Left,Lateral Lower Leg. The wound measures 2.5cm length x 3.7cm width x 0.1cm depth; 7.265cm^2 area and 0.726cm^3 volume. There is no tunneling or undermining noted. There is a large amount of serous drainage noted. The wound margin is flat and intact. There is no granulation within the wound bed. There is a large (67-100%) amount of necrotic tissue within the wound bed including Eschar and Adherent Slough. The periwound skin appearance exhibited: Erythema. The periwound skin appearance did not exhibit: Callus, Crepitus, Excoriation, Induration, Rash, Scarring, Dry/Scaly, Maceration, Atrophie Blanche, Cyanosis, Ecchymosis, Hemosiderin Staining, Mottled, Pallor, Rubor. The surrounding wound skin color is noted with erythema which is circumferential. Periwound temperature was noted as No Abnormality. The periwound has tenderness on palpation. Assessment Active Problems ICD-10 F00.712 - Non-pressure chronic ulcer of left calf with fat layer exposed I89.0 - Lymphedema, not elsewhere classified 83 year old patient who is quite alert and active had a long-standing  hematoma of the left lower lateral anterior calf which has been left untreated for 2 months. After sharp debridement and cleaning the wound I have recommended: 1. Santyl ointment locally to be applied daily. 2. Elevation and exercise have been discussed with the great detail 3. She can use her lymphedema pumps twice a day for half an hour each 4. Adequate amount of protein, vitamin E, vitamin C and zinc 5. If she is not able to get Santyl daily May use medihoney over the wound Procedures Wound #1 Pre-procedure diagnosis of Wound #1 is a Trauma, Other located on the Left,Lateral Lower Leg . There was a Skin/Subcutaneous Tissue Debridement ([id number]) debridement with total area of 9.25 sq cm performed by Christin Fudge, MD. with the following instrument(s): Forceps and Scissors to remove Viable and Non-Viable tissue/material including Fibrin/Slough, Eschar, and Subcutaneous after achieving pain control Joanne Michael, Joanne Michael. ([id number]) using Lidocaine 4% Topical Solution. A time out was conducted at 08:46, prior to the start of the procedure. A Minimum amount of bleeding was controlled with Pressure. The procedure was tolerated well with a pain level of 0 throughout and a pain level of 0 following the procedure. Post Debridement Measurements: 2.5cm length x 3.7cm width x 0.2cm depth; 1.453cm^3 volume. Character of Wound/Ulcer Post Debridement is improved. Post procedure Diagnosis Wound #1: Same as Pre-Procedure Plan Wound Cleansing: Wound #1 Left,Lateral Lower Leg: Clean wound with Normal Saline. Cleanse wound with mild soap and water Anesthetic: Wound #1 Left,Lateral Lower Leg: Topical Lidocaine 4% cream applied to wound bed prior to debridement Primary Wound Dressing: Wound #1 Left,Lateral Lower Leg: Santyl Ointment Secondary Dressing: Wound #1 Left,Lateral Lower Leg: Gauze, ABD and Kerlix/Conform Dressing Change Frequency: Wound #1 Left,Lateral Lower Leg: Change dressing every  day. Follow-up Appointments: Wound #1 Left,Lateral Lower Leg: Return Appointment in 1 week. - on Monday Edema Control: Wound #1 Left,Lateral Lower Leg: Elevate legs to the level of the heart and pump ankles as often as possible Compression Pump: Use compression pump on left lower extremity for 30 minutes, twice daily. Compression  Pump: Use compression pump on right lower extremity for 30 minutes, twice daily. Additional Orders / Instructions: Wound #1 Left,Lateral Lower Leg: Increase protein intake. Other: - Please add vitamin A, vitamin C and zinc supplements to your diet Medications-please add to medication list.: Wound #1 Left,Lateral Lower Leg: Santyl Enzymatic Ointment The following medication(s) was prescribed: Santyl topical 250 unit/gram ointment ointment topical as directed starting 07/26/2016 Joanne Michael, Joanne Michael ([id number]) 83 year old patient who is quite alert and active had a long-standing hematoma of the left lower lateral anterior calf which has been left untreated for 2 months. After sharp debridement and cleaning the wound I have recommended: 1. Santyl ointment locally to be applied daily. 2. Elevation and exercise have been discussed with the great detail 3. She can use her lymphedema pumps twice a day for half an hour each 4. Adequate amount of protein, vitamin E, vitamin C and zinc 5. If she is not able to get Santyl daily May use medihoney over the wound Electronic Signature(s) Signed: 07/26/2016 12:20:06 PM By: Christin Fudge MD, FACS Entered By: Christin Fudge on 07/26/2016 12:20:05 Joanne Michael ([id number]) -------------------------------------------------------------------------------- ROS/PFSH Details Patient Name: Joanne Michael Date of Service: 07/26/2016 8:00 AM Medical Record Number: [id number] Patient Account Number: [id number] Date of Birth/Sex: 1924/10/18 (83 y.Michael. Female) Treating RN: Montey Hora Primary Care Provider: Dion Body Other Clinician: Referring Provider: Mortimer Fries Treating Provider/Extender: Frann Rider in Treatment: 0 Information Obtained From Patient Wound History Do you currently have one or more open woundso Yes How many open wounds do you currently haveo 1 Approximately how long have you had your woundso 2-3 months How have you been treating your wound(s) until nowo open to air Has your wound(s) ever healed and then re-openedo Yes Have you had any lab work done in the past montho No Have you tested positive for an antibiotic resistant organism (MRSA, VRE)Michael No Have you tested positive for osteomyelitis (bone infection)Michael No Have you had any tests for circulation on your legso No Eyes Complaints and Symptoms: Positive for: Glasses / Contacts - glasses Medical History: Negative for: Cataracts; Glaucoma; Optic Neuritis Respiratory Complaints and Symptoms: No Complaints or Symptoms Complaints and Symptoms: Negative for: Chronic or frequent coughs; Shortness of Breath Medical History: Negative for: Aspiration; Asthma; Chronic Obstructive Pulmonary Disease (COPD); Pneumothorax; Sleep Apnea; Tuberculosis Past Medical History Notes: mycobacterial pulmonary disease Cardiovascular Complaints and Symptoms: Positive for: LE edema Negative for: Chest pain Medical History: Joanne Michael, Joanne Michael ([id number]) Positive for: Arrhythmia - a fib; Hypertension Negative for: Angina; Congestive Heart Failure; Coronary Artery Disease; Deep Vein Thrombosis; Hypotension; Myocardial Infarction; Peripheral Arterial Disease; Peripheral Venous Disease; Phlebitis; Vasculitis Past Medical History Notes: varicose veins Gastrointestinal Complaints and Symptoms: Negative for: Frequent diarrhea; Nausea; Vomiting Medical History: Negative for: Cirrhosis ; Colitis; Crohnos; Hepatitis A; Hepatitis B; Hepatitis C Endocrine Complaints and Symptoms: Negative for: Hepatitis; Thyroid disease; Polydypsia  (Excessive Thirst) Medical History: Negative for: Type I Diabetes; Type II Diabetes Genitourinary Complaints and Symptoms: Positive for: Kidney failure/ Dialysis - CKD stage 2 Immunological Complaints and Symptoms: Negative for: Hives; Itching Medical History: Negative for: Lupus Erythematosus; Raynaudos; Scleroderma Integumentary (Skin) Complaints and Symptoms: Negative for: Wounds; Bleeding or bruising tendency; Breakdown; Swelling Medical History: Negative for: History of Burn; History of pressure wounds Musculoskeletal Complaints and Symptoms: Negative for: Muscle Pain; Muscle Weakness Medical History: Positive for: Gout; Osteoarthritis Joanne Michael, Joanne Michael ([id number]) Negative for: Rheumatoid Arthritis; Osteomyelitis Neurologic Complaints and Symptoms: No Complaints or Symptoms Complaints and Symptoms: Negative for: Numbness/parasthesias;  Focal/Weakness Medical History: Negative for: Dementia; Neuropathy; Quadriplegia; Paraplegia; Seizure Disorder Psychiatric Complaints and Symptoms: No Complaints or Symptoms Complaints and Symptoms: Negative for: Anxiety; Claustrophobia Medical History: Negative for: Anorexia/bulimia; Confinement Anxiety Constitutional Symptoms (General Health) Complaints and Symptoms: No Complaints or Symptoms Ear/Nose/Mouth/Throat Complaints and Symptoms: No Complaints or Symptoms Medical History: Negative for: Chronic sinus problems/congestion; Middle ear problems Hematologic/Lymphatic Complaints and Symptoms: No Complaints or Symptoms Medical History: Negative for: Anemia; Hemophilia; Human Immunodeficiency Virus; Lymphedema; Sickle Cell Disease Oncologic Complaints and Symptoms: No Complaints or Symptoms Medical History: BRISTYN, KULESZA ([id number]) Negative for: Received Chemotherapy; Received Radiation Immunizations Pneumococcal Vaccine: Received Pneumococcal Vaccination: Yes Immunization Notes: up to date Family and Social  History Never smoker; Marital Status - Widowed; Alcohol Use: Never; Drug Use: No History; Caffeine Use: Daily; Financial Concerns: No; Food, Clothing or Shelter Needs: No; Support System Lacking: No; Transportation Concerns: No; Advanced Directives: No; Patient does not want information on Advanced Directives; Living Will: Yes (Not Provided); Medical Power of Attorney: Yes - daughter Pablo Ledger (Not Provided) Physician Affirmation I have reviewed and agree with the above information. Electronic Signature(s) Signed: 07/26/2016 4:34:04 PM By: Christin Fudge MD, FACS Signed: 07/26/2016 4:52:26 PM By: Montey Hora Entered By: Christin Fudge on 07/26/2016 08:41:05 Joanne Michael ([id number]) -------------------------------------------------------------------------------- [place] Details Patient Name: Joanne Michael Date of Service: 07/26/2016 Medical Record Number: [id number] Patient Account Number: [id number] Date of Birth/Sex: Jan 23, 1925 (83 y.Michael. Female) Treating RN: Montey Hora Primary Care Provider: Dion Body Other Clinician: Referring Provider: Mortimer Fries Treating Provider/Extender: Frann Rider in Treatment: 0 Diagnosis Coding ICD-10 Codes Code Description [phone] Non-pressure chronic ulcer of left calf with fat layer exposed I89.0 Lymphedema, not elsewhere classified Facility Procedures CPT4 Code: 15726203 Description: 99213 - WOUND CARE VISIT-LEV 3 EST PT Modifier: Quantity: 1 CPT4 Code: 55974163 Description: 11042 - DEB SUBQ TISSUE 20 SQ CM/< ICD-10 Description Diagnosis L97.222 Non-pressure chronic ulcer of left calf with fat l I89.0 Lymphedema, not elsewhere classified Modifier: ayer exposed Quantity: 1 Physician Procedures CPT4 Code: 8453646 Description: 80321 - WC PHYS LEVEL 4 - NEW PT ICD-10 Description Diagnosis L97.222 Non-pressure chronic ulcer of left calf with fat l I89.0 Lymphedema, not elsewhere classified Modifier: 25 ayer  exposed Quantity: 1 CPT4 Code: 2248250 Description: 11042 - WC PHYS SUBQ TISS 20 SQ CM ICD-10 Description Diagnosis L97.222 Non-pressure chronic ulcer of left calf with fat l I89.0 Lymphedema, not elsewhere classified Modifier: ayer exposed Quantity: 1 Electronic Signature(s) Signed: 07/26/2016 12:20:43 PM By: Christin Fudge MD, FACS Entered By: Christin Fudge on 07/26/2016 12:20:43

## 2016-07-28 NOTE — Progress Notes (Signed)
Joanne Michael, Joanne Michael ([id number]) Visit Report for 07/26/2016 Abuse/Suicide Risk Screen Details Patient Name: Joanne Michael Date of Service: 07/26/2016 8:00 AM Medical Record Number: [id number] Patient Account Number: [id number] Date of Birth/Sex: 1925/01/14 (83 y.o. Female) Treating RN: Montey Hora Primary Care Valen Gillison: Dion Body Other Clinician: Referring Dontell Mian: Mortimer Fries Treating Trig Mcbryar/Extender: Frann Rider in Treatment: 0 Abuse/Suicide Risk Screen Items Answer ABUSE/SUICIDE RISK SCREEN: Has anyone close to you tried to hurt or harm you recentlyo No Do you feel uncomfortable with anyone in your familyo No Has anyone forced you do things that you didnot want to doo No Do you have any thoughts of harming yourselfo No Patient displays signs or symptoms of abuse and/or neglect. No Electronic Signature(s) Signed: 07/26/2016 4:52:26 PM By: Montey Hora Entered By: Montey Hora on 07/26/2016 08:46:01 Joanne Michael ([id number]) -------------------------------------------------------------------------------- Activities of Daily Living Details Patient Name: Joanne Michael Date of Service: 07/26/2016 8:00 AM Medical Record Number: [id number] Patient Account Number: [id number] Date of Birth/Sex: [birth year] (83 y.o. Female) Treating RN: Montey Hora Primary Care Mikeisha Lemonds: Dion Body Other Clinician: Referring Sion Reinders: Mortimer Fries Treating Unnamed Zeien/Extender: Frann Rider in Treatment: 0 Activities of Daily Living Items Answer Activities of Daily Living (Please select one for each item) Drive Automobile Completely Able Take Medications Completely Able Use Telephone Completely Able Care for Appearance Completely Able Use Toilet Completely Able Bath / Shower Completely Able Dress Self Completely Able Feed Self Completely Able Walk Completely Able Get In / Out Bed Completely Able Housework Completely Able Prepare Meals  Completely Able Handle Money Completely Able Shop for Self Completely Able Electronic Signature(s) Signed: 07/26/2016 4:52:26 PM By: Montey Hora Entered By: Montey Hora on 07/26/2016 08:46:20 Joanne Michael ([id number]) -------------------------------------------------------------------------------- Education Assessment Details Patient Name: Joanne Michael Date of Service: 07/26/2016 8:00 AM Medical Record Number: [id number] Patient Account Number: [id number] Date of Birth/Sex: 1925/01/21 (83 y.o. Female) Treating RN: Montey Hora Primary Care Marlean Mortell: Dion Body Other Clinician: Referring Samia Kukla: Mortimer Fries Treating Delia Slatten/Extender: Frann Rider in Treatment: 0 Primary Learner Assessed: Patient Learning Preferences/Education Level/Primary Language Learning Preference: Explanation, Demonstration Highest Education Level: College or Above Preferred Language: English Cognitive Barrier Assessment/Beliefs Language Barrier: No Translator Needed: No Memory Deficit: No Emotional Barrier: No Cultural/Religious Beliefs Affecting Medical No Care: Physical Barrier Assessment Impaired Vision: No Impaired Hearing: No Decreased Hand dexterity: No Knowledge/Comprehension Assessment Knowledge Level: Medium Comprehension Level: Medium Ability to understand written Medium instructions: Ability to understand verbal Medium instructions: Motivation Assessment Anxiety Level: Calm Cooperation: Cooperative Education Importance: Acknowledges Need Interest in Health Problems: Asks Questions Perception: Coherent Willingness to Engage in Self- Medium Management Activities: Readiness to Engage in Self- Medium Management Activities: Electronic Signature(s) Joanne Michael, Joanne Michael ([id number]) Signed: 07/26/2016 4:52:26 PM By: Montey Hora Entered By: Montey Hora on 07/26/2016 08:48:11 Joanne Michael  ([id number]) -------------------------------------------------------------------------------- Fall Risk Assessment Details Patient Name: Joanne Michael Date of Service: 07/26/2016 8:00 AM Medical Record Number: [id number] Patient Account Number: [id number] Date of Birth/Sex: 06/07/1924 (83 y.o. Female) Treating RN: Montey Hora Primary Care Sylvan Lahm: Dion Body Other Clinician: Referring Camrin Lapre: Mortimer Fries Treating Kavita Bartl/Extender: Frann Rider in Treatment: 0 Fall Risk Assessment Items Have you had 2 or more falls in the last 12 monthso 0 No Have you had any fall that resulted in injury in the last 12 monthso 0 No FALL RISK ASSESSMENT: History of falling - immediate or within 3 months 0 No Secondary diagnosis 0 No Ambulatory aid None/bed rest/wheelchair/nurse 0 Yes Crutches/cane/walker 0 No Furniture  0 No IV Access/Saline Lock 0 No Gait/Training Normal/bed rest/immobile 0 No Weak 10 Yes Impaired 0 No Mental Status Oriented to own ability 0 Yes Electronic Signature(s) Signed: 07/26/2016 4:52:26 PM By: Montey Hora Entered By: Montey Hora on 07/26/2016 08:24:14 Joanne Michael ([id number]) -------------------------------------------------------------------------------- Foot Assessment Details Patient Name: Joanne Michael Date of Service: 07/26/2016 8:00 AM Medical Record Number: [id number] Patient Account Number: [id number] Date of Birth/Sex: 1924-05-10 (83 y.o. Female) Treating RN: Montey Hora Primary Care Xaniyah Buchholz: Dion Body Other Clinician: Referring Raylea Adcox: Mortimer Fries Treating Bing Duffey/Extender: Frann Rider in Treatment: 0 Foot Assessment Items Site Locations + = Sensation present, - = Sensation absent, C = Callus, U = Ulcer R = Redness, W = Warmth, M = Maceration, PU = Pre-ulcerative lesion F = Fissure, S = Swelling, D = Dryness Assessment Right: Left: Other Deformity: No No Prior Foot Ulcer: No  No Prior Amputation: No No Charcot Joint: No No Ambulatory Status: Ambulatory Without Help Gait: Steady Electronic Signature(s) Signed: 07/26/2016 4:52:26 PM By: Montey Hora Entered By: Montey Hora on 07/26/2016 08:31:03 Joanne Michael ([id number]) -------------------------------------------------------------------------------- Nutrition Risk Assessment Details Patient Name: Joanne Michael Date of Service: 07/26/2016 8:00 AM Medical Record Number: [id number] Patient Account Number: [id number] Date of Birth/Sex: 03/31/1924 (83 y.o. Female) Treating RN: Montey Hora Primary Care Jamise Pentland: Dion Body Other Clinician: Referring Seema Blum: Mortimer Fries Treating Keoki Mchargue/Extender: Frann Rider in Treatment: 0 Height (in): 60 Weight (lbs): 116 Body Mass Index (BMI): 22.7 Nutrition Risk Assessment Items NUTRITION RISK SCREEN: I have an illness or condition that made me change the kind and/or 0 No amount of food I eat I eat fewer than two meals per day 0 No I eat few fruits and vegetables, or milk products 0 No I have three or more drinks of beer, liquor or wine almost every day 0 No I have tooth or mouth problems that make it hard for me to eat 0 No I don't always have enough money to buy the food I need 0 No I eat alone most of the time 0 No I take three or more different prescribed or over-the-counter drugs a 1 Yes day Without wanting to, I have lost or gained 10 pounds in the last six 0 No months I am not always physically able to shop, cook and/or feed myself 0 No Nutrition Protocols Good Risk Protocol 0 No interventions needed Moderate Risk Protocol Electronic Signature(s) Signed: 07/26/2016 4:52:26 PM By: Montey Hora Entered By: Montey Hora on 07/26/2016 08:31:10

## 2016-07-28 NOTE — Progress Notes (Signed)
MERTIE, HASLEM ([id number]) Visit Report for 07/26/2016 Allergy List Details Patient Name: Joanne Michael, Joanne Michael Date of Service: 07/26/2016 8:00 AM Medical Record Number: [id number] Patient Account Number: [id number] Date of Birth/Sex: 1924-10-12 (83 y.o. Female) Treating RN: Montey Hora Primary Care Adalynn Corne: Dion Body Other Clinician: Referring [place] Prout: Mortimer Fries Treating Monaye Blackie/Extender: Frann Rider in Treatment: 0 Allergies Active Allergies silicone, adhesive oxycodone HCl amlodipine amoxicillin codeine Darvocet-N Demerol iodine meperidine Vicodin Allergy Notes Electronic Signature(s) Signed: 07/26/2016 4:52:26 PM By: Montey Hora Entered By: Montey Hora on 07/26/2016 08:45:51 Joanne Michael ([id number]) -------------------------------------------------------------------------------- Arrival Information Details Patient Name: Joanne Michael Date of Service: 07/26/2016 8:00 AM Medical Record Number: [id number] Patient Account Number: [id number] Date of Birth/Sex: Mar 19, 1924 (83 y.o. Female) Treating RN: Montey Hora Primary Care Marylynne Keelin: Dion Body Other Clinician: Referring Mekesha Solomon: Mortimer Fries Treating Ashyia Schraeder/Extender: Frann Rider in Treatment: 0 Visit Information Patient Arrived: Ambulatory Arrival Time: 08:10 Accompanied By: son in law Transfer Assistance: None Patient Identification Verified: Yes Secondary Verification Process Yes Completed: Patient Has Alerts: Yes Patient Alerts: ABI [place] BILATERAL >220 Electronic Signature(s) Signed: 07/26/2016 4:52:26 PM By: Montey Hora Entered By: Montey Hora on 07/26/2016 08:43:54 Joanne Michael ([id number]) -------------------------------------------------------------------------------- Clinic Level of Care Assessment Details Patient Name: Joanne Michael Date of Service: 07/26/2016 8:00 AM Medical Record Number: [id number] Patient Account Number:  [id number] Date of Birth/Sex: 1924-03-24 (83 y.o. Female) Treating RN: Montey Hora Primary Care Anthony Tamburo: Dion Body Other Clinician: Referring Nikcole Eischeid: Mortimer Fries Treating Stephana Morell/Extender: Frann Rider in Treatment: 0 Clinic Level of Care Assessment Items TOOL 1 Quantity Score []  - Use when EandM and Procedure is performed on INITIAL visit 0 ASSESSMENTS - Nursing Assessment / Reassessment X - General Physical Exam (combine w/ comprehensive assessment (listed just 1 20 below) when performed on new pt. evals) X - Comprehensive Assessment (HX, ROS, Risk Assessments, Wounds Hx, etc.) 1 25 ASSESSMENTS - Wound and Skin Assessment / Reassessment []  - Dermatologic / Skin Assessment (not related to wound area) 0 ASSESSMENTS - Ostomy and/or Continence Assessment and Care []  - Incontinence Assessment and Management 0 []  - Ostomy Care Assessment and Management (repouching, etc.) 0 PROCESS - Coordination of Care X - Simple Patient / Family Education for ongoing care 1 15 []  - Complex (extensive) Patient / Family Education for ongoing care 0 []  - Staff obtains Programmer, systems, Records, Test Results / Process Orders 0 []  - Staff telephones HHA, Nursing Homes / Clarify orders / etc 0 []  - Routine Transfer to another Facility (non-emergent condition) 0 []  - Routine Hospital Admission (non-emergent condition) 0 X - New Admissions / Biomedical engineer / Ordering NPWT, Apligraf, etc. 1 15 []  - Emergency Hospital Admission (emergent condition) 0 PROCESS - Special Needs []  - Pediatric / Minor Patient Management 0 []  - Isolation Patient Management 0 Joanne Michael, Joanne Michael ([id number]) []  - Hearing / Language / Visual special needs 0 []  - Assessment of Community assistance (transportation, D/C planning, etc.) 0 []  - Additional assistance / Altered mentation 0 []  - Support Surface(s) Assessment (bed, cushion, seat, etc.) 0 INTERVENTIONS - Miscellaneous []  - External ear exam 0 []  -  Patient Transfer (multiple staff / Civil Service fast streamer / Similar devices) 0 []  - Simple Staple / Suture removal (25 or less) 0 []  - Complex Staple / Suture removal (26 or more) 0 []  - Hypo/Hyperglycemic Management (do not check if billed separately) 0 X - Ankle / Brachial Index (ABI) - do not check if billed separately 1 15 Has the patient been  seen at the hospital within the last three years: Yes Total Score: 90 Level Of Care: New/Established - Level 3 Electronic Signature(s) Signed: 07/26/2016 4:52:26 PM By: Montey Hora Entered By: Montey Hora on 07/26/2016 10:50:41 Joanne Michael ([id number]) -------------------------------------------------------------------------------- Encounter Discharge Information Details Patient Name: Joanne Michael Date of Service: 07/26/2016 8:00 AM Medical Record Number: [id number] Patient Account Number: [id number] Date of Birth/Sex: 07-15-1924 (83 y.o. Female) Treating RN: Montey Hora Primary Care Jordon Bourquin: Dion Body Other Clinician: Referring Avyon Herendeen: Mortimer Fries Treating Olinda Nola/Extender: Frann Rider in Treatment: 0 Encounter Discharge Information Items Discharge Pain Level: 0 Discharge Condition: Stable Ambulatory Status: Ambulatory Discharge Destination: Home Transportation: Private Auto Accompanied By: son in law Schedule Follow-up Appointment: Yes Medication Reconciliation completed and provided to Patient/Care No Abass Misener: Provided on Clinical Summary of Care: 07/26/2016 Form Type Recipient Paper Patient JM Electronic Signature(s) Signed: 07/26/2016 10:51:50 AM By: Montey Hora Previous Signature: 07/26/2016 9:12:07 AM Version By: Ruthine Dose Entered By: Montey Hora on 07/26/2016 10:51:49 Joanne Michael ([id number]) -------------------------------------------------------------------------------- Lower Extremity Assessment Details Patient Name: Joanne Michael Date of Service: 07/26/2016 8:00  AM Medical Record Number: [id number] Patient Account Number: [id number] Date of Birth/Sex: 1925-01-02 (83 y.o. Female) Treating RN: Montey Hora Primary Care Aidyn Kellis: Dion Body Other Clinician: Referring Teah Votaw: Mortimer Fries Treating Chayla Shands/Extender: Frann Rider in Treatment: 0 Vascular Assessment Pulses: Dorsalis Pedis Palpable: [Left:Yes] [Right:Yes] Doppler Audible: [Left:Yes] [Right:Yes] Posterior Tibial Palpable: [Left:Yes] [Right:Yes] Doppler Audible: [Left:Yes] [Right:Yes] Extremity colors, hair growth, and conditions: Extremity Color: [Left:Hyperpigmented] [Right:Hyperpigmented] Hair Growth on Extremity: [Left:No] [Right:No] Temperature of Extremity: [Left:Warm] [Right:Warm] Capillary Refill: [Left:< 3 seconds] [Right:< 3 seconds] Toe Nail Assessment Left: Right: Thick: Yes Yes Discolored: Yes Yes Deformed: No No Improper Length and Hygiene: No No Notes ABI [place] BILATERAL >220 Electronic Signature(s) Signed: 07/26/2016 4:52:26 PM By: Montey Hora Entered By: Montey Hora on 07/26/2016 08:37:06 Joanne Michael ([id number]) -------------------------------------------------------------------------------- Multi Wound Chart Details Patient Name: Joanne Michael Date of Service: 07/26/2016 8:00 AM Medical Record Number: [id number] Patient Account Number: [id number] Date of Birth/Sex: 30-Sep-1924 (83 y.o. Female) Treating RN: Montey Hora Primary Care Docie Abramovich: Dion Body Other Clinician: Referring Calan Doren: Mortimer Fries Treating Joanne Michael: Frann Rider in Treatment: 0 Vital Signs Height(in): 60 Pulse(bpm): 53 Weight(lbs): 116 Blood Pressure 138/47 (mmHg): Body Mass Index(BMI): 23 Temperature(F): 97.5 Respiratory Rate 18 (breaths/min): Photos: [1:No Photos] [N/A:N/A] Wound Location: [1:Left Lower Leg - Lateral N/A] Wounding Event: [1:Trauma] [N/A:N/A] Primary Etiology: [1:Trauma, Other]  [N/A:N/A] Comorbid History: [1:Arrhythmia, Hypertension, N/A Gout, Osteoarthritis] Date Acquired: [1:04/30/2016] [N/A:N/A] Weeks of Treatment: [1:0] [N/A:N/A] Wound Status: [1:Open] [N/A:N/A] Measurements L x W x D 2.5x3.7x0.1 [N/A:N/A] (cm) Area (cm) : [1:7.265] [N/A:N/A] Volume (cm) : [1:0.726] [N/A:N/A] Classification: [1:Full Thickness Without Exposed Support Structures] [N/A:N/A] Exudate Amount: [1:Large] [N/A:N/A] Exudate Type: [1:Serous] [N/A:N/A] Exudate Color: [1:amber] [N/A:N/A] Wound Margin: [1:Flat and Intact] [N/A:N/A] Granulation Amount: [1:None Present (0%)] [N/A:N/A] Necrotic Amount: [1:Large (67-100%)] [N/A:N/A] Necrotic Tissue: [1:Eschar, Adherent Slough N/A] Exposed Structures: [1:Fascia: No Fat Layer (Subcutaneous Tissue) Exposed: No Tendon: No Muscle: No Joint: No Bone: No] [N/A:N/A] Epithelialization: None N/A N/A Periwound Skin Texture: Excoriation: No N/A N/A Induration: No Callus: No Crepitus: No Rash: No Scarring: No Periwound Skin Maceration: No N/A N/A Moisture: Dry/Scaly: No Periwound Skin Color: Erythema: Yes N/A N/A Atrophie Blanche: No Cyanosis: No Ecchymosis: No Hemosiderin Staining: No Mottled: No Pallor: No Rubor: No Erythema Location: Circumferential N/A N/A Temperature: No Abnormality N/A N/A Tenderness on Yes N/A N/A Palpation: Wound Preparation: Ulcer Cleansing: N/A N/A Rinsed/Irrigated with  Saline Topical Anesthetic Applied: Other: lidocaine 4% Treatment Notes Electronic Signature(s) Signed: 07/26/2016 4:52:26 PM By: Montey Hora Entered By: Montey Hora on 07/26/2016 08:51:34 Joanne Michael) -------------------------------------------------------------------------------- [place] Details Patient Name: Joanne Michael Date of Service: 07/26/2016 8:00 AM Medical Record Number: [id number] Patient Account Number: [id number] Date of Birth/Sex: 05-Jan-1925 (83 y.o. Female) Treating RN:  Montey Hora Primary Care Saphire Barnhart: Dion Body Other Clinician: Referring Sarahelizabeth Conway: Mortimer Fries Treating Yailin Biederman/Extender: Frann Rider in Treatment: 0 Active Inactive ` Abuse / Safety / Falls / Self Care Management Nursing Diagnoses: Potential for falls Goals: Patient will remain injury free related to falls Date Initiated: 07/26/2016 Target Resolution Date: 09/28/2016 Goal Status: Active Interventions: Assess fall risk on admission and as needed Notes: ` Orientation to the Wound Care Program Nursing Diagnoses: Knowledge deficit related to the wound healing center program Goals: Patient/caregiver will verbalize understanding of the [place] Program Date Initiated: 07/26/2016 Target Resolution Date: 09/28/2016 Goal Status: Active Interventions: Provide education on orientation to the wound center Notes: ` Wound/Skin Impairment Nursing Diagnoses: Impaired tissue integrity Joanne Michael, Joanne Michael ([id number]) Goals: Patient/caregiver will verbalize understanding of skin care regimen Date Initiated: 07/26/2016 Target Resolution Date: 09/28/2016 Goal Status: Active Ulcer/skin breakdown will have a volume reduction of 30% by week 4 Date Initiated: 07/26/2016 Target Resolution Date: 09/28/2016 Goal Status: Active Ulcer/skin breakdown will have a volume reduction of 50% by week 8 Date Initiated: 07/26/2016 Target Resolution Date: 09/28/2016 Goal Status: Active Ulcer/skin breakdown will have a volume reduction of 80% by week 12 Date Initiated: 07/26/2016 Target Resolution Date: 09/28/2016 Goal Status: Active Ulcer/skin breakdown will heal within 14 weeks Date Initiated: 07/26/2016 Target Resolution Date: 09/28/2016 Goal Status: Active Interventions: Assess patient/caregiver ability to obtain necessary supplies Assess patient/caregiver ability to perform ulcer/skin care regimen upon admission and as needed Assess ulceration(s) every visit Notes: Electronic  Signature(s) Signed: 07/26/2016 4:52:26 PM By: Montey Hora Entered By: Montey Hora on 07/26/2016 08:51:14 Joanne Michael ([id number]) -------------------------------------------------------------------------------- Pain Assessment Details Patient Name: Joanne Michael Date of Service: 07/26/2016 8:00 AM Medical Record Number: [id number] Patient Account Number: [id number] Date of Birth/Sex: Jul 14, 1924 (83 y.o. Female) Treating RN: Montey Hora Primary Care Rand Boller: Dion Body Other Clinician: Referring Jamirah Zelaya: Mortimer Fries Treating Colonel Krauser/Extender: Frann Rider in Treatment: 0 Active Problems Location of Pain Severity and Description of Pain Patient Has Paino Yes Site Locations Pain Location: Pain in Ulcers Duration of the Pain. Constant / Intermittento Intermittent Pain Management and Medication Current Pain Management: Notes Topical or injectable lidocaine is offered to patient for acute pain when surgical debridement is performed. If needed, Patient is instructed to use over the counter pain medication for the following 24-48 hours after debridement. Wound care MDs do not prescribed pain medications. Patient has chronic pain or uncontrolled pain. Patient has been instructed to make an appointment with their Primary Care Physician for pain management. Electronic Signature(s) Signed: 07/26/2016 4:52:26 PM By: Montey Hora Entered By: Montey Hora on 07/26/2016 08:15:08 Joanne Michael ([id number]) -------------------------------------------------------------------------------- Patient/Caregiver Education Details Patient Name: Joanne Michael Date of Service: 07/26/2016 8:00 AM Medical Record Number: [id number] Patient Account Number: [id number] Date of Birth/Gender: Dec 07, 1924 (83 y.o. Female) Treating RN: Montey Hora Primary Care Physician: Dion Body Other Clinician: Referring Physician: Mortimer Fries Treating  Physician/Extender: Frann Rider in Treatment: 0 Education Assessment Education Provided To: Patient and Caregiver Education Topics Provided Wound/Skin Impairment: Handouts: Other: wound care as ordered Methods: Demonstration, Explain/Verbal Responses: State content correctly Electronic Signature(s) Signed: 07/26/2016  4:52:26 PM By: Montey Hora Entered By: Montey Hora on 07/26/2016 10:52:12 Joanne Michael ([id number]) -------------------------------------------------------------------------------- Wound Assessment Details Patient Name: Joanne Michael Date of Service: 07/26/2016 8:00 AM Medical Record Number: [id number] Patient Account Number: [id number] Date of Birth/Sex: October 19, 1924 (83 y.o. Female) Treating RN: Montey Hora Primary Care Novaleigh Kohlman: Dion Body Other Clinician: Referring Ilai Hiller: Mortimer Fries Treating Regnia Mathwig/Extender: Frann Rider in Treatment: 0 Wound Status Wound Number: 1 Primary Trauma, Other Etiology: Wound Location: Left Lower Leg - Lateral Wound Status: Open Wounding Event: Trauma Comorbid Arrhythmia, Hypertension, Gout, Date Acquired: 04/30/2016 History: Osteoarthritis Weeks Of Treatment: 0 Clustered Wound: No Photos Photo Uploaded By: Montey Hora on 07/26/2016 10:26:52 Wound Measurements Length: (cm) 2.5 Width: (cm) 3.7 Depth: (cm) 0.1 Area: (cm) 7.265 Volume: (cm) 0.726 % Reduction in Area: % Reduction in Volume: Epithelialization: None Tunneling: No Undermining: No Wound Description Full Thickness Without Exposed Classification: Support Structures Wound Margin: Flat and Intact Exudate Large Amount: Exudate Type: Serous Exudate Color: amber Foul Odor After Cleansing: No Slough/Fibrino Yes Wound Bed Granulation Amount: None Present (0%) Exposed Structure Necrotic Amount: Large (67-100%) Fascia Exposed: No Necrotic Quality: Eschar, Adherent Slough Fat Layer (Subcutaneous Tissue)  Exposed: No Joanne Michael, MULE. ([id number]) Tendon Exposed: No Muscle Exposed: No Joint Exposed: No Bone Exposed: No Periwound Skin Texture Texture Color No Abnormalities Noted: No No Abnormalities Noted: No Callus: No Atrophie Blanche: No Crepitus: No Cyanosis: No Excoriation: No Ecchymosis: No Induration: No Erythema: Yes Rash: No Erythema Location: Circumferential Scarring: No Hemosiderin Staining: No Mottled: No Moisture Pallor: No No Abnormalities Noted: No Rubor: No Dry / Scaly: No Maceration: No Temperature / Pain Temperature: No Abnormality Tenderness on Palpation: Yes Wound Preparation Ulcer Cleansing: Rinsed/Irrigated with Saline Topical Anesthetic Applied: Other: lidocaine 4%, Treatment Notes Wound #1 (Left, Lateral Lower Leg) 1. Cleansed with: Clean wound with Normal Saline 2. Anesthetic Topical Lidocaine 4% cream to wound bed prior to debridement 4. Dressing Applied: Santyl Ointment 5. Secondary Dressing Applied Guaze, ABD and kerlix/Conform 7. Secured with Recruitment consultant) Signed: 07/26/2016 4:52:26 PM By: Montey Hora Entered By: Montey Hora on 07/26/2016 08:27:25 Joanne Michael ([id number]) -------------------------------------------------------------------------------- [place] Details Patient Name: Joanne Michael Date of Service: 07/26/2016 8:00 AM Medical Record Number: [id number] Patient Account Number: [id number] Date of Birth/Sex: 1924/07/29 (83 y.o. Female) Treating RN: Montey Hora Primary Care Modine Oppenheimer: Dion Body Other Clinician: Referring Kamran Coker: Mortimer Fries Treating Bracy Pepper/Extender: Frann Rider in Treatment: 0 Vital Signs Time Taken: 08:15 Temperature (F): 97.5 Height (in): 60 Pulse (bpm): 53 Source: Measured Respiratory Rate (breaths/min): 18 Weight (lbs): 116 Blood Pressure (mmHg): 138/47 Source: Measured Reference Range: 80 - 120 mg / dl Body Mass Index (BMI):  22.7 Electronic Signature(s) Signed: 07/26/2016 4:52:26 PM By: Montey Hora Entered By: Montey Hora on 07/26/2016 08:16:10

## 2016-07-30 ENCOUNTER — Encounter: Payer: Medicare Other | Attending: Surgery | Admitting: Surgery

## 2016-07-30 DIAGNOSIS — L97222 Non-pressure chronic ulcer of left calf with fat layer exposed: Secondary | ICD-10-CM | POA: Diagnosis not present

## 2016-07-30 DIAGNOSIS — M4800 Spinal stenosis, site unspecified: Secondary | ICD-10-CM | POA: Insufficient documentation

## 2016-07-30 DIAGNOSIS — I4891 Unspecified atrial fibrillation: Secondary | ICD-10-CM | POA: Insufficient documentation

## 2016-07-30 DIAGNOSIS — D649 Anemia, unspecified: Secondary | ICD-10-CM | POA: Insufficient documentation

## 2016-07-30 DIAGNOSIS — Z880 Allergy status to penicillin: Secondary | ICD-10-CM | POA: Diagnosis not present

## 2016-07-30 DIAGNOSIS — M109 Gout, unspecified: Secondary | ICD-10-CM | POA: Insufficient documentation

## 2016-07-30 DIAGNOSIS — I890 Lymphedema, not elsewhere classified: Secondary | ICD-10-CM | POA: Insufficient documentation

## 2016-07-30 DIAGNOSIS — Z885 Allergy status to narcotic agent status: Secondary | ICD-10-CM | POA: Insufficient documentation

## 2016-07-30 DIAGNOSIS — N182 Chronic kidney disease, stage 2 (mild): Secondary | ICD-10-CM | POA: Insufficient documentation

## 2016-07-30 DIAGNOSIS — I129 Hypertensive chronic kidney disease with stage 1 through stage 4 chronic kidney disease, or unspecified chronic kidney disease: Secondary | ICD-10-CM | POA: Diagnosis not present

## 2016-07-30 DIAGNOSIS — Z888 Allergy status to other drugs, medicaments and biological substances status: Secondary | ICD-10-CM | POA: Insufficient documentation

## 2016-07-30 DIAGNOSIS — Z96659 Presence of unspecified artificial knee joint: Secondary | ICD-10-CM | POA: Insufficient documentation

## 2016-07-30 DIAGNOSIS — Z79899 Other long term (current) drug therapy: Secondary | ICD-10-CM | POA: Insufficient documentation

## 2016-07-30 DIAGNOSIS — M1990 Unspecified osteoarthritis, unspecified site: Secondary | ICD-10-CM | POA: Diagnosis not present

## 2016-07-30 NOTE — Progress Notes (Signed)
Joanne Michael, Joanne Michael ([id number]) Visit Report for 07/30/2016 Chief Complaint Document Details Patient Name: Joanne Michael, Joanne Michael Date of Service: 07/30/2016 12:30 PM Medical Record Number: [id number] Patient Account Number: [id number] Date of Birth/Sex: 11-May-1924 (83 y.o. Female) Treating RN: Montey Hora Primary Care Provider: Dion Body Other Clinician: Referring Provider: Dion Body Treating Provider/Extender: Frann Rider in Treatment: 0 Information Obtained from: Patient Chief Complaint Patient presents to the wound care center due with non-wound condition(s) the left lower extremity which she's had for 2 months Electronic Signature(s) Signed: 07/30/2016 12:57:22 PM By: Christin Fudge MD, FACS Entered By: Christin Fudge on 07/30/2016 12:57:22 Joanne Michael ([id number]) -------------------------------------------------------------------------------- Debridement Details Patient Name: Joanne Michael Date of Service: 07/30/2016 12:30 PM Medical Record Number: [id number] Patient Account Number: [id number] Date of Birth/Sex: November 09, 1924 (83 y.o. Female) Treating RN: Montey Hora Primary Care Provider: Dion Body Other Clinician: Referring Provider: Dion Body Treating Provider/Extender: Frann Rider in Treatment: 0 Debridement Performed for Wound #1 Left,Lateral Lower Leg Assessment: Performed By: Physician Christin Fudge, MD Debridement: Debridement Pre-procedure Verification/Time Out Yes - 12:50 Taken: Start Time: 12:50 Pain Control: Lidocaine 4% Topical Solution Level: Skin/Subcutaneous Tissue Total Area Debrided (L x 3 (cm) x 3.8 (cm) = 11.4 (cm) W): Tissue and other Viable, Non-Viable, Eschar, Fibrin/Slough, Subcutaneous material debrided: Instrument: Curette Bleeding: Minimum Hemostasis Achieved: Pressure End Time: 12:52 Procedural Pain: 0 Post Procedural Pain: 0 Response to Treatment: Procedure was tolerated  well Post Debridement Measurements of Total Wound Length: (cm) 3 Width: (cm) 3.8 Depth: (cm) 0.2 Volume: (cm) 1.791 Character of Wound/Ulcer Post Improved Debridement: Post Procedure Diagnosis Same as Pre-procedure Electronic Signature(s) Signed: 07/30/2016 12:57:16 PM By: Christin Fudge MD, FACS Signed: 07/30/2016 3:57:57 PM By: Montey Hora Entered By: Christin Fudge on 07/30/2016 12:57:16 Joanne Michael ([id number]) -------------------------------------------------------------------------------- HPI Details Patient Name: Joanne Michael Date of Service: 07/30/2016 12:30 PM Medical Record Number: [id number] Patient Account Number: [id number] Date of Birth/Sex: April 13, 1924 (83 y.o. Female) Treating RN: Montey Hora Primary Care Provider: Dion Body Other Clinician: Referring Provider: Dion Body Treating Provider/Extender: Frann Rider in Treatment: 0 History of Present Illness Location: Patient presents with an ulcer on the left lower extremity in the anterior lateral calf area Quality: Patient reports experiencing a sharp pain to affected area(s). Severity: Patient states wound are getting worse. Duration: Patient has had the wound for > 2 months prior to seeking treatment at the wound center Timing: Pain in wound is Intermittent (comes and goes Context: The wound occurred when the patient had a blunt trauma to the legs and had a large swelling Modifying Factors: Other treatment(s) tried include:local care with antibiotic ointment Associated Signs and Symptoms: Patient reports having increase swelling. HPI Description: 83 year old patient referred to [place] by Ardyth Man, PA, for a ulcer on the left lower extremity, with cellulitis caused by trauma 2 months ago. past medical history of atrial fibrillation, chronic renal failure stage II, essential hypertension, and deficiency anemia, osteoporosis and spinal stenosis. She is status post appendectomy,  gallbladder surgery, hernia repair, hysterectomy, knee replacement. She was recently placed on doxycycline. Electronic Signature(s) Signed: 07/30/2016 12:57:26 PM By: Christin Fudge MD, FACS Entered By: Christin Fudge on 07/30/2016 12:57:26 Joanne Michael ([id number]) -------------------------------------------------------------------------------- Physical Exam Details Patient Name: Joanne Michael Date of Service: 07/30/2016 12:30 PM Medical Record Number: [id number] Patient Account Number: [id number] Date of Birth/Sex: 05/19/1924 (83 y.o. Female) Treating RN: Montey Hora Primary Care Provider: Dion Body Other Clinician: Referring Provider: Dion Body Treating Provider/Extender: Frann Rider in  Treatment: 0 Constitutional . Pulse regular. Respirations normal and unlabored. Afebrile. . Eyes Nonicteric. Reactive to light. Ears, Nose, Mouth, and Throat Lips, teeth, and gums WNL.Marland Kitchen Moist mucosa without lesions. Neck supple and nontender. No palpable supraclavicular or cervical adenopathy. Normal sized without goiter. Respiratory WNL. No retractions.. Cardiovascular Pedal Pulses WNL. No clubbing, cyanosis or edema. Chest Breasts symmetical and no nipple discharge.. Breast tissue WNL, no masses, lumps, or tenderness.. Lymphatic No adneopathy. No adenopathy. No adenopathy. Musculoskeletal Adexa without tenderness or enlargement.. Digits and nails w/o clubbing, cyanosis, infection, petechiae, ischemia, or inflammatory conditions.. Integumentary (Hair, Skin) No suspicious lesions. No crepitus or fluctuance. No peri-wound warmth or erythema. No masses.Marland Kitchen Psychiatric Judgement and insight Intact.. No evidence of depression, anxiety, or agitation.. Notes The patient's wound had a lot of necrotic debris which I sharply removed with a #3 curet and minimal bleeding controlled with pressure Electronic Signature(s) Signed: 07/30/2016 12:58:03 PM By: Christin Fudge MD,  FACS Entered By: Christin Fudge on 07/30/2016 12:58:03 Joanne Michael ([id number]) -------------------------------------------------------------------------------- Physician Orders Details Patient Name: Joanne Michael Date of Service: 07/30/2016 12:30 PM Medical Record Number: [id number] Patient Account Number: [id number] Date of Birth/Sex: 1924/08/06 (83 y.o. Female) Treating RN: Montey Hora Primary Care Provider: Dion Body Other Clinician: Referring Provider: Dion Body Treating Provider/Extender: Frann Rider in Treatment: 0 Verbal / Phone Orders: No Diagnosis Coding Wound Cleansing Wound #1 Left,Lateral Lower Leg o Clean wound with Normal Saline. o Cleanse wound with mild soap and water Anesthetic Wound #1 Left,Lateral Lower Leg o Topical Lidocaine 4% cream applied to wound bed prior to debridement Primary Wound Dressing Wound #1 Left,Lateral Lower Leg o Santyl Ointment Secondary Dressing Wound #1 Left,Lateral Lower Leg o Gauze, ABD and Kerlix/Conform Dressing Change Frequency Wound #1 Left,Lateral Lower Leg o Change dressing every day. Follow-up Appointments Wound #1 Left,Lateral Lower Leg o Return Appointment in 1 week. - on Monday Edema Control Wound #1 Left,Lateral Lower Leg o Elevate legs to the level of the heart and pump ankles as often as possible o Compression Pump: Use compression pump on left lower extremity for 30 minutes, twice daily. o Compression Pump: Use compression pump on right lower extremity for 30 minutes, twice daily. Additional Orders / Instructions Wound #1 Left,Lateral Lower Leg Joanne Michael, Joanne Michael. ([id number]) o Increase protein intake. o Other: - Please add vitamin A, vitamin C and zinc supplements to your diet Medications-please add to medication list. Wound #1 Left,Lateral Lower Leg o Santyl Enzymatic Ointment Electronic Signature(s) Signed: 07/30/2016 3:36:12 PM By: Christin Fudge  MD, FACS Signed: 07/30/2016 3:57:57 PM By: Montey Hora Entered By: Montey Hora on 07/30/2016 12:53:11 Joanne Michael ([id number]) -------------------------------------------------------------------------------- Problem List Details Patient Name: Joanne Michael Date of Service: 07/30/2016 12:30 PM Medical Record Number: [id number] Patient Account Number: [id number] Date of Birth/Sex: 1924-03-05 (83 y.o. Female) Treating RN: Montey Hora Primary Care Provider: Dion Body Other Clinician: Referring Provider: Dion Body Treating Provider/Extender: Frann Rider in Treatment: 0 Active Problems ICD-10 Encounter Code Description Active Date Diagnosis L97.222 Non-pressure chronic ulcer of left calf with fat layer 07/26/2016 Yes exposed I89.0 Lymphedema, not elsewhere classified 07/26/2016 Yes Inactive Problems Resolved Problems Electronic Signature(s) Signed: 07/30/2016 12:57:04 PM By: Christin Fudge MD, FACS Entered By: Christin Fudge on 07/30/2016 12:57:04 Joanne Michael ([id number]) -------------------------------------------------------------------------------- Progress Note Details Patient Name: Joanne Michael Date of Service: 07/30/2016 12:30 PM Medical Record Number: [id number] Patient Account Number: [id number] Date of Birth/Sex: 05-02-1924 (83 y.o. Female) Treating RN: Montey Hora Primary Care Provider: Netty Starring,  Lucianne Muss Other Clinician: Referring Provider: Dion Body Treating Provider/Extender: Frann Rider in Treatment: 0 Subjective Chief Complaint Information obtained from Patient Patient presents to the wound care center due with non-wound condition(s) the left lower extremity which she's had for 2 months History of Present Illness (HPI) The following HPI elements were documented for the patient's wound: Location: Patient presents with an ulcer on the left lower extremity in the anterior lateral calf area Quality:  Patient reports experiencing a sharp pain to affected area(s). Severity: Patient states wound are getting worse. Duration: Patient has had the wound for > 2 months prior to seeking treatment at the wound center Timing: Pain in wound is Intermittent (comes and goes Context: The wound occurred when the patient had a blunt trauma to the legs and had a large swelling Modifying Factors: Other treatment(s) tried include:local care with antibiotic ointment Associated Signs and Symptoms: Patient reports having increase swelling. 83 year old patient referred to [place] by Ardyth Man, PA, for a ulcer on the left lower extremity, with cellulitis caused by trauma 2 months ago. past medical history of atrial fibrillation, chronic renal failure stage II, essential hypertension, and deficiency anemia, osteoporosis and spinal stenosis. She is status post appendectomy, gallbladder surgery, hernia repair, hysterectomy, knee replacement. She was recently placed on doxycycline. Objective Constitutional Pulse regular. Respirations normal and unlabored. Afebrile. Vitals Time Taken: 12:40 PM, Height: 60 in, Weight: 116 lbs, BMI: 22.7, Temperature: 97.8 F, Pulse: 48 bpm, Respiratory Rate: 18 breaths/min, Blood Pressure: 147/57 mmHg. Eyes Nonicteric. Reactive to light. Joanne Michael, Joanne Michael ([id number]) Ears, Nose, Mouth, and Throat Lips, teeth, and gums WNL.Marland Kitchen Moist mucosa without lesions. Neck supple and nontender. No palpable supraclavicular or cervical adenopathy. Normal sized without goiter. Respiratory WNL. No retractions.. Cardiovascular Pedal Pulses WNL. No clubbing, cyanosis or edema. Chest Breasts symmetical and no nipple discharge.. Breast tissue WNL, no masses, lumps, or tenderness.. Lymphatic No adneopathy. No adenopathy. No adenopathy. Musculoskeletal Adexa without tenderness or enlargement.. Digits and nails w/o clubbing, cyanosis, infection, petechiae, ischemia, or inflammatory  conditions.Marland Kitchen Psychiatric Judgement and insight Intact.. No evidence of depression, anxiety, or agitation.. General Notes: The patient's wound had a lot of necrotic debris which I sharply removed with a #3 curet and minimal bleeding controlled with pressure Integumentary (Hair, Skin) No suspicious lesions. No crepitus or fluctuance. No peri-wound warmth or erythema. No masses.. Wound #1 status is Open. Original cause of wound was Trauma. The wound is located on the Left,Lateral Lower Leg. The wound measures 3cm length x 3.8cm width x 0.1cm depth; 8.954cm^2 area and 0.895cm^3 volume. There is no tunneling or undermining noted. There is a large amount of serous drainage noted. The wound margin is flat and intact. There is no granulation within the wound bed. There is a large (67-100%) amount of necrotic tissue within the wound bed including Eschar and Adherent Slough. The periwound skin appearance exhibited: Erythema. The periwound skin appearance did not exhibit: Callus, Crepitus, Excoriation, Induration, Rash, Scarring, Dry/Scaly, Maceration, Atrophie Blanche, Cyanosis, Ecchymosis, Hemosiderin Staining, Mottled, Pallor, Rubor. The surrounding wound skin color is noted with erythema which is circumferential. Periwound temperature was noted as No Abnormality. The periwound has tenderness on palpation. Assessment Joanne Michael, Joanne Michael ([id number]) Active Problems ICD-10 [phone] - Non-pressure chronic ulcer of left calf with fat layer exposed I89.0 - Lymphedema, not elsewhere classified Procedures Wound #1 Pre-procedure diagnosis of Wound #1 is a Trauma, Other located on the Left,Lateral Lower Leg . There was a Skin/Subcutaneous Tissue Debridement ([id number]) debridement with total area of 11.4  sq cm performed by Christin Fudge, MD. with the following instrument(s): Curette to remove Viable and Non-Viable tissue/material including Fibrin/Slough, Eschar, and Subcutaneous after achieving pain  control using Lidocaine 4% Topical Solution. A time out was conducted at 12:50, prior to the start of the procedure. A Minimum amount of bleeding was controlled with Pressure. The procedure was tolerated well with a pain level of 0 throughout and a pain level of 0 following the procedure. Post Debridement Measurements: 3cm length x 3.8cm width x 0.2cm depth; 1.791cm^3 volume. Character of Wound/Ulcer Post Debridement is improved. Post procedure Diagnosis Wound #1: Same as Pre-Procedure Plan Wound Cleansing: Wound #1 Left,Lateral Lower Leg: Clean wound with Normal Saline. Cleanse wound with mild soap and water Anesthetic: Wound #1 Left,Lateral Lower Leg: Topical Lidocaine 4% cream applied to wound bed prior to debridement Primary Wound Dressing: Wound #1 Left,Lateral Lower Leg: Santyl Ointment Secondary Dressing: Wound #1 Left,Lateral Lower Leg: Gauze, ABD and Kerlix/Conform Dressing Change Frequency: Wound #1 Left,Lateral Lower Leg: Change dressing every day. Follow-up Appointments: Wound #1 Left,Lateral Lower Leg: Joanne Michael, Joanne Michael ([id number]) Return Appointment in 1 week. - on Monday Edema Control: Wound #1 Left,Lateral Lower Leg: Elevate legs to the level of the heart and pump ankles as often as possible Compression Pump: Use compression pump on left lower extremity for 30 minutes, twice daily. Compression Pump: Use compression pump on right lower extremity for 30 minutes, twice daily. Additional Orders / Instructions: Wound #1 Left,Lateral Lower Leg: Increase protein intake. Other: - Please add vitamin A, vitamin C and zinc supplements to your diet Medications-please add to medication list.: Wound #1 Left,Lateral Lower Leg: Santyl Enzymatic Ointment After sharp debridement and cleaning the wound I have recommended: 1. Santyl ointment locally to be applied daily. 2. Elevation and exercise have been discussed with the great detail 3. She can use her lymphedema pumps  twice a day for half an hour each 4. Adequate amount of protein, vitamin E, vitamin C and zinc 5. I have asked her to wash well with soap and water daily Electronic Signature(s) Signed: 07/30/2016 12:59:38 PM By: Christin Fudge MD, FACS Entered By: Christin Fudge on 07/30/2016 12:59:38 Joanne Michael ([id number]) -------------------------------------------------------------------------------- SuperBill Details Patient Name: Joanne Michael Date of Service: 07/30/2016 Medical Record Number: [id number] Patient Account Number: [id number] Date of Birth/Sex: 05-16-1924 (83 y.o. Female) Treating RN: Montey Hora Primary Care Provider: Dion Body Other Clinician: Referring Provider: Dion Body Treating Provider/Extender: Frann Rider in Treatment: 0 Diagnosis Coding ICD-10 Codes Code Description [phone] Non-pressure chronic ulcer of left calf with fat layer exposed I89.0 Lymphedema, not elsewhere classified Facility Procedures CPT4 Code: 86761950 Description: 93267 - DEB SUBQ TISSUE 20 SQ CM/< ICD-10 Description Diagnosis L97.222 Non-pressure chronic ulcer of left calf with fat l I89.0 Lymphedema, not elsewhere classified Modifier: ayer exposed Quantity: 1 Physician Procedures CPT4 Code: 1245809 Description: 98338 - WC PHYS SUBQ TISS 20 SQ CM ICD-10 Description Diagnosis L97.222 Non-pressure chronic ulcer of left calf with fat l I89.0 Lymphedema, not elsewhere classified Modifier: ayer exposed Quantity: 1 Electronic Signature(s) Signed: 07/30/2016 12:59:49 PM By: Christin Fudge MD, FACS Entered By: Christin Fudge on 07/30/2016 12:59:49

## 2016-07-30 NOTE — Progress Notes (Signed)
Joanne Michael, Joanne Michael ([id number]) Visit Report for 07/30/2016 Arrival Information Details Patient Name: Joanne Michael, Joanne Michael Date of Service: 07/30/2016 12:30 PM Medical Record Number: [id number] Patient Account Number: [id number] Date of Birth/Sex: February 04, 1925 (83 y.o. Female) Treating RN: Montey Hora Primary Care Pearlena Ow: Dion Body Other Clinician: Referring Guiliana Shor: Dion Body Treating Johnluke Haugen/Extender: Frann Rider in Treatment: 0 Visit Information History Since Last Visit Added or deleted any medications: No Patient Arrived: Ambulatory Any new allergies or adverse reactions: No Arrival Time: 12:36 Had a fall or experienced change in No Accompanied By: self activities of daily living that may affect Transfer Assistance: None risk of falls: Patient Identification Verified: Yes Signs or symptoms of abuse/neglect since last No Secondary Verification Yes visito Process Completed: Hospitalized since last visit: No Patient Has Alerts: Yes Has Dressing in Place as Prescribed: Yes Patient Alerts: ABI Gordon BILATERAL Pain Present Now: Yes >220 Electronic Signature(s) Signed: 07/30/2016 3:57:57 PM By: Montey Hora Entered By: Montey Hora on 07/30/2016 12:39:35 Joanne Michael ([id number]) -------------------------------------------------------------------------------- Encounter Discharge Information Details Patient Name: Joanne Michael Date of Service: 07/30/2016 12:30 PM Medical Record Number: [id number] Patient Account Number: [id number] Date of Birth/Sex: 1924/07/17 (83 y.o. Female) Treating RN: Montey Hora Primary Care Donatello Kleve: Dion Body Other Clinician: Referring Brinlynn Gorton: Dion Body Treating Jae Bruck/Extender: Frann Rider in Treatment: 0 Encounter Discharge Information Items Discharge Pain Level: 0 Discharge Condition: Stable Ambulatory Status: Ambulatory Discharge Destination:  Home Private Transportation: Auto Accompanied By: self Schedule Follow-up Appointment: Yes Medication Reconciliation completed and No provided to Patient/Care Lesli Issa: Clinical Summary of Care: Electronic Signature(s) Signed: 07/30/2016 3:57:57 PM By: Montey Hora Entered By: Montey Hora on 07/30/2016 12:44:42 Joanne Michael ([id number]) -------------------------------------------------------------------------------- Lower Extremity Assessment Details Patient Name: Joanne Michael Date of Service: 07/30/2016 12:30 PM Medical Record Number: [id number] Patient Account Number: [id number] Date of Birth/Sex: May 14, 1924 (83 y.o. Female) Treating RN: Montey Hora Primary Care [place]: Dion Body Other Clinician: Referring Joncarlo Friberg: Dion Body Treating Jaquetta Currier/Extender: Frann Rider in Treatment: 0 Edema Assessment Assessed: [Left: No] [Right: No] Edema: [Left: Ye] [Right: s] Vascular Assessment Pulses: Dorsalis Pedis Palpable: [Left:Yes] Posterior Tibial Extremity colors, hair growth, and conditions: Extremity Color: [Left:Hyperpigmented] Hair Growth on Extremity: [Left:No] Temperature of Extremity: [Left:Warm] Capillary Refill: [Left:< 3 seconds] Toe Nail Assessment Left: Right: Thick: Yes Discolored: No Deformed: No Improper Length and Hygiene: No Electronic Signature(s) Signed: 07/30/2016 3:57:57 PM By: Montey Hora Entered By: Montey Hora on 07/30/2016 12:42:57 Joanne Michael ([id number]) -------------------------------------------------------------------------------- Multi Wound Chart Details Patient Name: Joanne Michael Date of Service: 07/30/2016 12:30 PM Medical Record Number: [id number] Patient Account Number: [id number] Date of Birth/Sex: Jan 29, 1925 (83 y.o. Female) Treating RN: Montey Hora Primary Care Jaydee Ingman: Dion Body Other Clinician: Referring Gilman Olazabal: Dion Body Treating Elen Acero/Extender:  Frann Rider in Treatment: 0 Vital Signs Height(in): 60 Pulse(bpm): 48 Weight(lbs): 116 Blood Pressure 147/57 (mmHg): Body Mass Index(BMI): 23 Temperature(F): 97.8 Respiratory Rate 18 (breaths/min): Photos: [1:No Photos] [N/A:N/A] Wound Location: [1:Left Lower Leg - Lateral N/A] Wounding Event: [1:Trauma] [N/A:N/A] Primary Etiology: [1:Trauma, Other] [N/A:N/A] Comorbid History: [1:Arrhythmia, Hypertension, N/A Gout, Osteoarthritis] Date Acquired: [1:04/30/2016] [N/A:N/A] Weeks of Treatment: [1:0] [N/A:N/A] Wound Status: [1:Open] [N/A:N/A] Measurements L x W x D 3x3.8x0.1 [N/A:N/A] (cm) Area (cm) : [1:8.954] [N/A:N/A] Volume (cm) : [1:0.895] [N/A:N/A] % Reduction in Area: [1:-23.20%] [N/A:N/A] % Reduction in Volume: -23.30% [N/A:N/A] Classification: [1:Full Thickness Without Exposed Support Structures] [N/A:N/A] Exudate Amount: [1:Large] [N/A:N/A] Exudate Type: [1:Serous] [N/A:N/A] Exudate Color: [1:amber] [N/A:N/A] Wound Margin: [1:Flat and Intact] [N/A:N/A] Granulation Amount: [1:None  Present (0%)] [N/A:N/A] Necrotic Amount: [1:Large (67-100%)] [N/A:N/A] Necrotic Tissue: [1:Eschar, Adherent Slough N/A] Exposed Structures: [1:Fascia: No Fat Layer (Subcutaneous Tissue) Exposed: No Tendon: No Muscle: No] [N/A:N/A] Joint: No Bone: No Epithelialization: None N/A N/A Debridement: Debridement (44034- N/A N/A 11047) Pre-procedure 12:50 N/A N/A Verification/Time Out Taken: Pain Control: Lidocaine 4% Topical N/A N/A Solution Tissue Debrided: Necrotic/Eschar, N/A N/A Fibrin/Slough, Subcutaneous Level: Skin/Subcutaneous N/A N/A Tissue Debridement Area (sq 11.4 N/A N/A cm): Instrument: Curette N/A N/A Bleeding: Minimum N/A N/A Hemostasis Achieved: Pressure N/A N/A Procedural Pain: 0 N/A N/A Post Procedural Pain: 0 N/A N/A Debridement Treatment Procedure was tolerated N/A N/A Response: well Post Debridement 3x3.8x0.2 N/A N/A Measurements L x W x  D (cm) Post Debridement 1.791 N/A N/A Volume: (cm) Periwound Skin Texture: Excoriation: No N/A N/A Induration: No Callus: No Crepitus: No Rash: No Scarring: No Periwound Skin Maceration: No N/A N/A Moisture: Dry/Scaly: No Periwound Skin Color: Erythema: Yes N/A N/A Atrophie Blanche: No Cyanosis: No Ecchymosis: No Hemosiderin Staining: No Mottled: No Pallor: No Rubor: No Erythema Location: Circumferential N/A N/A Temperature: No Abnormality N/A N/A Tenderness on Yes N/A N/A Palpation: Wound Preparation: N/A N/A Joanne Michael, Joanne Michael ([id number]) Ulcer Cleansing: Rinsed/Irrigated with Saline Topical Anesthetic Applied: Other: lidocaine 4% Procedures Performed: Debridement N/A N/A Treatment Notes Wound #1 (Left, Lateral Lower Leg) 1. Cleansed with: Clean wound with Normal Saline 2. Anesthetic Topical Lidocaine 4% cream to wound bed prior to debridement 4. Dressing Applied: Santyl Ointment 5. Secondary Dressing Applied Guaze, ABD and kerlix/Conform 7. Secured with Recruitment consultant) Signed: 07/30/2016 12:57:09 PM By: Christin Fudge MD, FACS Entered By: Christin Fudge on 07/30/2016 12:57:09 Joanne Michael ([id number]) -------------------------------------------------------------------------------- [place] Details Patient Name: Joanne Michael Date of Service: 07/30/2016 12:30 PM Medical Record Number: [id number] Patient Account Number: [id number] Date of Birth/Sex: 1924/06/28 (83 y.o. Female) Treating RN: Montey Hora Primary Care Tracker Mance: Dion Body Other Clinician: Referring Aryonna Gunnerson: Dion Body Treating Darrien Belter/Extender: Frann Rider in Treatment: 0 Active Inactive ` Abuse / Safety / Falls / Self Care Management Nursing Diagnoses: Potential for falls Goals: Patient will remain injury free related to falls Date Initiated: 07/26/2016 Target Resolution Date: 09/28/2016 Goal Status:  Active Interventions: Assess fall risk on admission and as needed Notes: ` Orientation to the Wound Care Program Nursing Diagnoses: Knowledge deficit related to the wound healing center program Goals: Patient/caregiver will verbalize understanding of the [facility] Date Initiated: 07/26/2016 Target Resolution Date: 09/28/2016 Goal Status: Active Interventions: Provide education on orientation to the wound center Notes: ` Wound/Skin Impairment Nursing Diagnoses: Impaired tissue integrity Joanne Michael, Joanne Michael ([id number]) Goals: Patient/caregiver will verbalize understanding of skin care regimen Date Initiated: 07/26/2016 Target Resolution Date: 09/28/2016 Goal Status: Active Ulcer/skin breakdown will have a volume reduction of 30% by week 4 Date Initiated: 07/26/2016 Target Resolution Date: 09/28/2016 Goal Status: Active Ulcer/skin breakdown will have a volume reduction of 50% by week 8 Date Initiated: 07/26/2016 Target Resolution Date: 09/28/2016 Goal Status: Active Ulcer/skin breakdown will have a volume reduction of 80% by week 12 Date Initiated: 07/26/2016 Target Resolution Date: 09/28/2016 Goal Status: Active Ulcer/skin breakdown will heal within 14 weeks Date Initiated: 07/26/2016 Target Resolution Date: 09/28/2016 Goal Status: Active Interventions: Assess patient/caregiver ability to obtain necessary supplies Assess patient/caregiver ability to perform ulcer/skin care regimen upon admission and as needed Assess ulceration(s) every visit Notes: Electronic Signature(s) Signed: 07/30/2016 3:57:57 PM By: Montey Hora Entered By: Montey Hora on 07/30/2016 12:44:13 Joanne Michael ([id number]) -------------------------------------------------------------------------------- Pain Assessment Details  Patient Name: Joanne Michael, Joanne Michael Date of Service: 07/30/2016 12:30 PM Medical Record Number: [id number] Patient Account Number: [id number] Date of Birth/Sex: [birth year]  (83 y.o. Female) Treating RN: Montey Hora Primary Care Krystle Oberman: Dion Body Other Clinician: Referring Horacio Werth: Dion Body Treating Lieutenant Abarca/Extender: Frann Rider in Treatment: 0 Active Problems Location of Pain Severity and Description of Pain Patient Has Paino Yes Site Locations Pain Location: Pain in Ulcers With Dressing Change: Yes Pain Management and Medication Current Pain Management: Notes Topical or injectable lidocaine is offered to patient for acute pain when surgical debridement is performed. If needed, Patient is instructed to use over the counter pain medication for the following 24-48 hours after debridement. Wound care MDs do not prescribed pain medications. Patient has chronic pain or uncontrolled pain. Patient has been instructed to make an appointment with their Primary Care Physician for pain management. Electronic Signature(s) Signed: 07/30/2016 3:57:57 PM By: Montey Hora Entered By: Montey Hora on 07/30/2016 12:40:02 Joanne Michael ([id number]) -------------------------------------------------------------------------------- Patient/Caregiver Education Details Patient Name: Joanne Michael Date of Service: 07/30/2016 12:30 PM Medical Record Number: [id number] Patient Account Number: [id number] Date of Birth/Gender: December 09, 1924 (83 y.o. Female) Treating RN: Montey Hora Primary Care Physician: Dion Body Other Clinician: Referring Physician: Dion Body Treating Physician/Extender: Frann Rider in Treatment: 0 Education Assessment Education Provided To: Patient Education Topics Provided Wound/Skin Impairment: Handouts: Other: wound care as ordered Methods: Demonstration, Explain/Verbal Responses: State content correctly Electronic Signature(s) Signed: 07/30/2016 3:57:57 PM By: Montey Hora Entered By: Montey Hora on 07/30/2016 12:44:58 Joanne Michael  ([id number]) -------------------------------------------------------------------------------- Wound Assessment Details Patient Name: Joanne Michael Date of Service: 07/30/2016 12:30 PM Medical Record Number: [id number] Patient Account Number: [id number] Date of Birth/Sex: 10-11-1924 (83 y.o. Female) Treating RN: Montey Hora Primary Care Varnell Orvis: Dion Body Other Clinician: Referring Huong Luthi: Dion Body Treating Neela Zecca/Extender: Frann Rider in Treatment: 0 Wound Status Wound Number: 1 Primary Trauma, Other Etiology: Wound Location: Left Lower Leg - Lateral Wound Status: Open Wounding Event: Trauma Comorbid Arrhythmia, Hypertension, Gout, Date Acquired: 04/30/2016 History: Osteoarthritis Weeks Of Treatment: 0 Clustered Wound: No Photos Photo Uploaded By: Montey Hora on 07/30/2016 13:10:48 Wound Measurements Length: (cm) 3 Width: (cm) 3.8 Depth: (cm) 0.1 Area: (cm) 8.954 Volume: (cm) 0.895 % Reduction in Area: -23.2% % Reduction in Volume: -23.3% Epithelialization: None Tunneling: No Undermining: No Wound Description Full Thickness Without Exposed Classification: Support Structures Wound Margin: Flat and Intact Exudate Large Amount: Exudate Type: Serous Exudate Color: amber Foul Odor After Cleansing: No Slough/Fibrino Yes Wound Bed Granulation Amount: None Present (0%) Exposed Structure Necrotic Amount: Large (67-100%) Fascia Exposed: No Necrotic Quality: Eschar, Adherent Slough Fat Layer (Subcutaneous Tissue) Exposed: No Joanne Michael, Joanne O. ([id number]) Tendon Exposed: No Muscle Exposed: No Joint Exposed: No Bone Exposed: No Periwound Skin Texture Texture Color No Abnormalities Noted: No No Abnormalities Noted: No Callus: No Atrophie Blanche: No Crepitus: No Cyanosis: No Excoriation: No Ecchymosis: No Induration: No Erythema: Yes Rash: No Erythema Location: Circumferential Scarring: No Hemosiderin Staining:  No Mottled: No Moisture Pallor: No No Abnormalities Noted: No Rubor: No Dry / Scaly: No Maceration: No Temperature / Pain Temperature: No Abnormality Tenderness on Palpation: Yes Wound Preparation Ulcer Cleansing: Rinsed/Irrigated with Saline Topical Anesthetic Applied: Other: lidocaine 4%, Treatment Notes Wound #1 (Left, Lateral Lower Leg) 1. Cleansed with: Clean wound with Normal Saline 2. Anesthetic Topical Lidocaine 4% cream to wound bed prior to debridement 4. Dressing Applied: Santyl Ointment 5. Secondary Dressing Applied Guaze, ABD and kerlix/Conform 7. Secured with Licensed conveyancer  Signature(s) Signed: 07/30/2016 3:57:57 PM By: Montey Hora Entered By: Montey Hora on 07/30/2016 12:42:06 Joanne Michael ([id number]) -------------------------------------------------------------------------------- [place] Details Patient Name: Joanne Michael Date of Service: 07/30/2016 12:30 PM Medical Record Number: [id number] Patient Account Number: [id number] Date of Birth/Sex: Dec 29, 1924 (83 y.o. Female) Treating RN: Montey Hora Primary Care Icelyn Navarrete: Dion Body Other Clinician: Referring Deke Tilghman: Dion Body Treating Kaiden Pech/Extender: Frann Rider in Treatment: 0 Vital Signs Time Taken: 12:40 Temperature (F): 97.8 Height (in): 60 Pulse (bpm): 48 Weight (lbs): 116 Respiratory Rate (breaths/min): 18 Body Mass Index (BMI): 22.7 Blood Pressure (mmHg): 147/57 Reference Range: 80 - 120 mg / dl Electronic Signature(s) Signed: 07/30/2016 3:57:57 PM By: Montey Hora Entered By: Montey Hora on 07/30/2016 12:40:20

## 2016-07-31 ENCOUNTER — Other Ambulatory Visit: Payer: Self-pay

## 2016-07-31 ENCOUNTER — Ambulatory Visit (INDEPENDENT_AMBULATORY_CARE_PROVIDER_SITE_OTHER): Payer: Medicare Other

## 2016-07-31 DIAGNOSIS — I350 Nonrheumatic aortic (valve) stenosis: Secondary | ICD-10-CM | POA: Diagnosis not present

## 2016-08-06 ENCOUNTER — Encounter: Payer: Medicare Other | Admitting: Surgery

## 2016-08-06 DIAGNOSIS — L97222 Non-pressure chronic ulcer of left calf with fat layer exposed: Secondary | ICD-10-CM | POA: Diagnosis not present

## 2016-08-07 NOTE — Progress Notes (Signed)
Joanne Michael, Joanne Michael ([id number]) Visit Report for 08/06/2016 Chief Complaint Document Details Patient Name: Joanne Michael, Joanne Michael Date of Service: 08/06/2016 3:45 PM Medical Record Number: [id number] Patient Account Number: [id number] Date of Birth/Sex: Jul 08, 1924 (83 y.o. Female) Treating RN: Montey Hora Primary Care Provider: Dion Body Other Clinician: Referring Provider: Dion Body Treating Provider/Extender: Frann Rider in Treatment: 1 Information Obtained from: Patient Chief Complaint Patient presents to the wound care center due with non-wound condition(s) the left lower extremity which she's had for 2 months Electronic Signature(s) Signed: 08/06/2016 4:01:12 PM By: Christin Fudge MD, FACS Entered By: Christin Fudge on 08/06/2016 16:01:12 Joanne Michael ([id number]) -------------------------------------------------------------------------------- Debridement Details Patient Name: Joanne Michael Date of Service: 08/06/2016 3:45 PM Medical Record Number: [id number] Patient Account Number: [id number] Date of Birth/Sex: 1924-06-01 (83 y.o. Female) Treating RN: Montey Hora Primary Care Provider: Dion Body Other Clinician: Referring Provider: Dion Body Treating Provider/Extender: Frann Rider in Treatment: 1 Debridement Performed for Wound #1 Left,Lateral Lower Leg Assessment: Performed By: Physician Christin Fudge, MD Debridement: Debridement Pre-procedure Verification/Time Out Yes - 15:56 Taken: Start Time: 15:56 Pain Control: Lidocaine 4% Topical Solution Level: Skin/Subcutaneous Tissue Total Area Debrided (L x 2.1 (cm) x 3 (cm) = 6.3 (cm) W): Tissue and other Viable, Non-Viable, Eschar, Fibrin/Slough, Subcutaneous material debrided: Instrument: Curette Bleeding: Minimum Hemostasis Achieved: Pressure End Time: 15:59 Procedural Pain: 0 Post Procedural Pain: 0 Response to Treatment: Procedure was tolerated  well Post Debridement Measurements of Total Wound Length: (cm) 2.1 Width: (cm) 3 Depth: (cm) 0.2 Volume: (cm) 0.99 Character of Wound/Ulcer Post Improved Debridement: Post Procedure Diagnosis Same as Pre-procedure Electronic Signature(s) Signed: 08/06/2016 4:01:06 PM By: Christin Fudge MD, FACS Signed: 08/06/2016 4:59:03 PM By: Montey Hora Entered By: Christin Fudge on 08/06/2016 16:01:06 Joanne Michael ([id number]) -------------------------------------------------------------------------------- HPI Details Patient Name: Joanne Michael Date of Service: 08/06/2016 3:45 PM Medical Record Number: [id number] Patient Account Number: [id number] Date of Birth/Sex: Oct 22, 1924 (83 y.o. Female) Treating RN: Montey Hora Primary Care Provider: Dion Body Other Clinician: Referring Provider: Dion Body Treating Provider/Extender: Frann Rider in Treatment: 1 History of Present Illness Location: Patient presents with an ulcer on the left lower extremity in the anterior lateral calf area Quality: Patient reports experiencing a sharp pain to affected area(s). Severity: Patient states wound are getting worse. Duration: Patient has had the wound for > 2 months prior to seeking treatment at the wound center Timing: Pain in wound is Intermittent (comes and goes Context: The wound occurred when the patient had a blunt trauma to the legs and had a large swelling Modifying Factors: Other treatment(s) tried include:local care with antibiotic ointment Associated Signs and Symptoms: Patient reports having increase swelling. HPI Description: 83 year old patient referred to [place] by Ardyth Man, PA, for a ulcer on the left lower extremity, with cellulitis caused by trauma 2 months ago. past medical history of atrial fibrillation, chronic renal failure stage II, essential hypertension, and deficiency anemia, osteoporosis and spinal stenosis. She is status post appendectomy,  gallbladder surgery, hernia repair, hysterectomy, knee replacement. She was recently placed on doxycycline. 08/06/2016 -- she has not been using her lymphedema pumps and I have urged her to do this. She will be going off to the beach for a week and will see as the week after Electronic Signature(s) Signed: 08/06/2016 4:01:38 PM By: Christin Fudge MD, FACS Entered By: Christin Fudge on 08/06/2016 16:01:38 Joanne Michael ([id number]) -------------------------------------------------------------------------------- Physical Exam Details Patient Name: Joanne Michael Date of Service: 08/06/2016 3:45 PM  Medical Record Number: [id number] Patient Account Number: [id number] Date of Birth/Sex: [birth year] (83 y.o. Female) Treating RN: Montey Hora Primary Care Provider: Dion Body Other Clinician: Referring Provider: Dion Body Treating Provider/Extender: Frann Rider in Treatment: 1 Constitutional . Pulse regular. Respirations normal and unlabored. Afebrile. . Eyes Nonicteric. Reactive to light. Ears, Nose, Mouth, and Throat Lips, teeth, and gums WNL.Marland Kitchen Moist mucosa without lesions. Neck supple and nontender. No palpable supraclavicular or cervical adenopathy. Normal sized without goiter. Respiratory WNL. No retractions.. Cardiovascular Pedal Pulses WNL. No clubbing, cyanosis or edema. Lymphatic No adneopathy. No adenopathy. No adenopathy. Musculoskeletal Adexa without tenderness or enlargement.. Digits and nails w/o clubbing, cyanosis, infection, petechiae, ischemia, or inflammatory conditions.. Integumentary (Hair, Skin) No suspicious lesions. No crepitus or fluctuance. No peri-wound warmth or erythema. No masses.Marland Kitchen Psychiatric Judgement and insight Intact.. No evidence of depression, anxiety, or agitation.. Notes sharp removal of debris was done with a #3 curet and the bleeding controlled with pressure. The wound is looking healthy. Electronic  Signature(s) Signed: 08/06/2016 4:02:05 PM By: Christin Fudge MD, FACS Entered By: Christin Fudge on 08/06/2016 16:02:04 Joanne Michael ([id number]) -------------------------------------------------------------------------------- Physician Orders Details Patient Name: Joanne Michael Date of Service: 08/06/2016 3:45 PM Medical Record Number: [id number] Patient Account Number: [id number] Date of Birth/Sex: 1924-04-04 (83 y.o. Female) Treating RN: Montey Hora Primary Care Provider: Dion Body Other Clinician: Referring Provider: Dion Body Treating Provider/Extender: Frann Rider in Treatment: 1 Verbal / Phone Orders: No Diagnosis Coding Wound Cleansing Wound #1 Left,Lateral Lower Leg o Clean wound with Normal Saline. o Cleanse wound with mild soap and water Anesthetic Wound #1 Left,Lateral Lower Leg o Topical Lidocaine 4% cream applied to wound bed prior to debridement Primary Wound Dressing Wound #1 Left,Lateral Lower Leg o Santyl Ointment Secondary Dressing Wound #1 Left,Lateral Lower Leg o Gauze, ABD and Kerlix/Conform Dressing Change Frequency Wound #1 Left,Lateral Lower Leg o Change dressing every day. Follow-up Appointments Wound #1 Left,Lateral Lower Leg o Return Appointment in 1 week. - on Monday Edema Control Wound #1 Left,Lateral Lower Leg o Elevate legs to the level of the heart and pump ankles as often as possible o Compression Pump: Use compression pump on left lower extremity for 30 minutes, twice daily. o Compression Pump: Use compression pump on right lower extremity for 30 minutes, twice daily. Additional Orders / Instructions Wound #1 Left,Lateral Lower Leg NASTACIA, RAYBUCK. ([id number]) o Increase protein intake. o Other: - Please add vitamin A, vitamin C and zinc supplements to your diet Medications-please add to medication list. Wound #1 Left,Lateral Lower Leg o Santyl Enzymatic  Ointment Electronic Signature(s) Signed: 08/06/2016 4:03:37 PM By: Christin Fudge MD, FACS Signed: 08/06/2016 4:59:03 PM By: Montey Hora Entered By: Montey Hora on 08/06/2016 15:59:21 Joanne Michael ([id number]) -------------------------------------------------------------------------------- Problem List Details Patient Name: Joanne Michael Date of Service: 08/06/2016 3:45 PM Medical Record Number: [id number] Patient Account Number: [id number] Date of Birth/Sex: Aug 03, 1924 (83 y.o. 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 Female) Treating RN: Montey Hora Primary Care Provider: Dion Body Other Clinician: Referring Provider: Dion Body Treating Provider/Extender: Frann Rider in Treatment: 1 Subjective Chief Complaint Information obtained from Patient Patient presents to the wound care center due with non-wound condition(s) the left lower extremity which she's had for 2 months History of Present Illness (HPI) The following HPI elements were documented for the patient's wound: Location: Patient presents with  an ulcer on the left lower extremity in the anterior lateral calf area Quality: Patient reports experiencing a sharp pain to affected area(s). Severity: Patient states wound are getting worse. Duration: Patient has had the wound for > 2 months prior to seeking treatment at the wound center Timing: Pain in wound is Intermittent (comes and goes Context: The wound occurred when the patient had a blunt trauma to the legs and had a large swelling Modifying Factors: Other treatment(s) tried include:local care with antibiotic ointment Associated Signs and Symptoms: Patient reports having increase swelling. 83 year old patient referred to [place] by Ardyth Man, PA, for a ulcer on the left lower extremity, with cellulitis caused by trauma 2 months ago. past medical history of atrial fibrillation, chronic renal failure stage II, essential hypertension, and deficiency anemia, osteoporosis and spinal stenosis. She is status post appendectomy, gallbladder surgery, hernia repair, hysterectomy, knee replacement. She was recently placed on doxycycline. 08/06/2016 -- she has not been using her lymphedema pumps and I have urged her to do this. She will be going off to the beach for a week and will see as the week after Objective Constitutional Pulse regular. Respirations normal and unlabored. Afebrile. Vitals Time Taken: 3:45 PM, Height: 60 in, Weight: 116 lbs, BMI: 22.7, Temperature: 97.9 F, Pulse: 63 bpm, Respiratory Rate: 18 breaths/min, Blood Pressure: 141/47 mmHg. Joanne Michael, Joanne Michael ([id number]) Eyes Nonicteric. Reactive to light. Ears, Nose, Mouth, and Throat Lips, teeth, and gums WNL.Marland Kitchen Moist mucosa without lesions. Neck supple and nontender. No palpable supraclavicular or cervical adenopathy. Normal sized without goiter. Respiratory WNL. No retractions.. Cardiovascular Pedal Pulses WNL. No clubbing, cyanosis or edema. Lymphatic No adneopathy. No adenopathy. No adenopathy. Musculoskeletal Adexa  without tenderness or enlargement.. Digits and nails w/o clubbing, cyanosis, infection, petechiae, ischemia, or inflammatory conditions.Marland Kitchen Psychiatric Judgement and insight Intact.. No evidence of depression, anxiety, or agitation.. General Notes: sharp removal of debris was done with a #3 curet and the bleeding controlled with pressure. The wound is looking healthy. Integumentary (Hair, Skin) No suspicious lesions. No crepitus or fluctuance. No peri-wound warmth or erythema. No masses.. Wound #1 status is Open. Original cause of wound was Trauma. The wound is located on the Left,Lateral Lower Leg. The wound measures 2.1cm length x 3cm width x 0.1cm depth; 4.948cm^2 area and 0.495cm^3 volume. There is no tunneling or undermining noted. There is a large amount of serous drainage noted. The wound margin is flat and intact. There is medium (34-66%) red granulation within the wound bed. There is a medium (34-66%) amount of necrotic tissue within the wound bed including Eschar and Adherent Slough. The periwound skin appearance exhibited: Erythema. The periwound skin appearance did not exhibit: Callus, Crepitus, Excoriation, Induration, Rash, Scarring, Dry/Scaly, Maceration, Atrophie Blanche, Cyanosis, Ecchymosis, Hemosiderin Staining, Mottled, Pallor, Rubor. The surrounding wound skin color is noted with erythema which is circumferential. Periwound temperature was noted as No Abnormality. The periwound has tenderness on palpation. Assessment Joanne Michael, Joanne Michael ([id number]) Active Problems ICD-10 [phone] - Non-pressure chronic ulcer of left calf with fat  layer exposed I89.0 - Lymphedema, not elsewhere classified Procedures Wound #1 Pre-procedure diagnosis of Wound #1 is a Trauma, Other located on the Left,Lateral Lower Leg . There was a Skin/Subcutaneous Tissue Debridement ([id number]) debridement with total area of 6.3 sq cm performed by Christin Fudge, MD. with the following instrument(s):  Curette to remove Viable and Non-Viable tissue/material including Fibrin/Slough, Eschar, and Subcutaneous after achieving pain control using Lidocaine 4% Topical Solution. A time out was conducted at 15:56, prior to the start of the procedure. A Minimum amount of bleeding was controlled with Pressure. The procedure was tolerated well with a pain level of 0 throughout and a pain level of 0 following the procedure. Post Debridement Measurements: 2.1cm length x 3cm width x 0.2cm depth; 0.99cm^3 volume. Character of Wound/Ulcer Post Debridement is improved. Post procedure Diagnosis Wound #1: Same as Pre-Procedure Plan Wound Cleansing: Wound #1 Left,Lateral Lower Leg: Clean wound with Normal Saline. Cleanse wound with mild soap and water Anesthetic: Wound #1 Left,Lateral Lower Leg: Topical Lidocaine 4% cream applied to wound bed prior to debridement Primary Wound Dressing: Wound #1 Left,Lateral Lower Leg: Santyl Ointment Secondary Dressing: Wound #1 Left,Lateral Lower Leg: Gauze, ABD and Kerlix/Conform Dressing Change Frequency: Wound #1 Left,Lateral Lower Leg: Change dressing every day. Follow-up Appointments: Wound #1 Left,Lateral Lower Leg: Joanne Michael, Joanne Michael ([id number]) Return Appointment in 1 week. - on Monday Edema Control: Wound #1 Left,Lateral Lower Leg: Elevate legs to the level of the heart and pump ankles as often as possible Compression Pump: Use compression pump on left lower extremity for 30 minutes, twice daily. Compression Pump: Use compression pump on right lower extremity for 30 minutes, twice daily. Additional Orders / Instructions: Wound #1 Left,Lateral Lower Leg: Increase protein intake. Other: - Please add vitamin A, vitamin C and zinc supplements to your diet Medications-please add to medication list.: Wound #1 Left,Lateral Lower Leg: Santyl Enzymatic Ointment After sharp debridement and cleaning the wound I have recommended: 1. Santyl ointment locally to  be applied daily. 2. Elevation and exercise have been discussed with the great detail 3. She can use her lymphedema pumps twice a day for half an hour each 4. she also has some compression stockings which she is going to begin using and I have asked them to bring them along next time she is here 5. Adequate amount of protein, vitamin A, vitamin C and zinc 6. I have asked her to wash well with soap and water daily Electronic Signature(s) Signed: 08/06/2016 4:03:10 PM By: Christin Fudge MD, FACS Entered By: Christin Fudge on 08/06/2016 16:03:10 Joanne Michael ([id number]) -------------------------------------------------------------------------------- SuperBill Details Patient Name: Joanne Michael Date of Service: 08/06/2016 Medical Record Number: [id number] Patient Account Number: [id number] Date of Birth/Sex: 03/22/1924 (83 y.o. Female) Treating RN: Montey Hora Primary Care Provider: Dion Body Other Clinician: Referring Provider: Dion Body Treating Provider/Extender: Frann Rider in Treatment: 1 Diagnosis Coding ICD-10 Codes Code Description [phone] Non-pressure chronic ulcer of left calf with fat layer exposed I89.0 Lymphedema, not elsewhere classified Facility Procedures CPT4 Code: [id number] Description: 11042 - DEB SUBQ TISSUE 20 SQ CM/< ICD-10 Description Diagnosis L97.222 Non-pressure chronic ulcer of left calf with fat l I89.0 Lymphedema, not elsewhere classified Modifier: ayer exposed Quantity: 1 Physician Procedures CPT4 Code: [id number] Description: 11042 - WC PHYS SUBQ TISS 20 SQ CM ICD-10 Description Diagnosis L97.222 Non-pressure chronic ulcer of left calf with fat l I89.0 Lymphedema, not elsewhere classified Modifier: ayer exposed Quantity: 1 Electronic Signature(s) Signed: 08/06/2016 4:03:20 PM By: Christin Fudge MD, FACS  Entered By: Christin Fudge on 08/06/2016 16:03:20

## 2016-08-07 NOTE — Progress Notes (Signed)
LAISA, LARRICK ([id number]) Visit Report for 08/06/2016 Arrival Information Details Patient Name: Joanne Michael, Joanne Michael Date of Service: 08/06/2016 3:45 PM Medical Record Number: [id number] Patient Account Number: [id number] Date of Birth/Sex: 17-Oct-1924 (83 y.o. Female) Treating RN: Montey Hora Primary Care Ayodele Hartsock: Dion Body Other Clinician: Referring Naviyah Schaffert: Dion Body Treating Pablo Stauffer/Extender: Frann Rider in Treatment: 1 Visit Information History Since Last Visit Added or deleted any medications: No Patient Arrived: Ambulatory Any new allergies or adverse reactions: No Arrival Time: 15:44 Had a fall or experienced change in No Accompanied By: SELF activities of daily living that may affect Transfer Assistance: None risk of falls: Patient Identification Verified: Yes Signs or symptoms of abuse/neglect since last No Secondary Verification Yes visito Process Completed: Hospitalized since last visit: No Patient Has Alerts: Yes Has Dressing in Place as Prescribed: Yes Patient Alerts: ABI [place] BILATERAL Pain Present Now: No >220 Electronic Signature(s) Signed: 08/06/2016 4:59:03 PM By: Montey Hora Entered By: Montey Hora on 08/06/2016 15:44:22 Joanne Michael ([id number]) -------------------------------------------------------------------------------- Encounter Discharge Information Details Patient Name: Joanne Michael Date of Service: 08/06/2016 3:45 PM Medical Record Number: [id number] Patient Account Number: [id number] Date of Birth/Sex: Jan 10, 1925 (83 y.o. Female) Treating RN: Montey Hora Primary Care Hansini Clodfelter: Dion Body Other Clinician: Referring Arelie Kuzel: Dion Body Treating Katoya Amato/Extender: Frann Rider in Treatment: 1 Encounter Discharge Information Items Discharge Pain Level: 0 Discharge Condition: Stable Ambulatory Status: Ambulatory Discharge Destination: Home Transportation: Private  Auto Accompanied By: self Schedule Follow-up Appointment: Yes Medication Reconciliation completed and provided to Patient/Care No Nekeya Briski: Provided on Clinical Summary of Care: 08/06/2016 Form Type Recipient Paper Patient JM Electronic Signature(s) Signed: 08/06/2016 4:14:44 PM By: Ruthine Dose Entered By: Ruthine Dose on 08/06/2016 16:14:44 Joanne Michael ([id number]) -------------------------------------------------------------------------------- Lower Extremity Assessment Details Patient Name: Joanne Michael Date of Service: 08/06/2016 3:45 PM Medical Record Number: [id number] Patient Account Number: [id number] Date of Birth/Sex: [birth year] (83 y.o. Female) Treating RN: Montey Hora Primary Care Avacyn Kloosterman: Dion Body Other Clinician: Referring Arisbeth Purrington: Dion Body Treating Sharona Rovner/Extender: Frann Rider in Treatment: 1 Vascular Assessment Pulses: Dorsalis Pedis Palpable: [Left:Yes] Posterior Tibial Extremity colors, hair growth, and conditions: Extremity Color: [Left:Hyperpigmented] Hair Growth on Extremity: [Left:No] Temperature of Extremity: [Left:Warm] Capillary Refill: [Left:< 3 seconds] Electronic Signature(s) Signed: 08/06/2016 4:59:03 PM By: Montey Hora Entered By: Montey Hora on 08/06/2016 15:55:36 Joanne Michael ([id number]) -------------------------------------------------------------------------------- Multi Wound Chart Details Patient Name: Joanne Michael Date of Service: 08/06/2016 3:45 PM Medical Record Number: [id number] Patient Account Number: [id number] Date of Birth/Sex: 28-Mar-1924 (83 y.o. Female) Treating RN: Montey Hora Primary Care Kharson Rasmusson: Dion Body Other Clinician: Referring Pamela Maddy: Dion Body Treating Charmion Hapke/Extender: Frann Rider in Treatment: 1 Vital Signs Height(in): 60 Pulse(bpm): 63 Weight(lbs): 116 Blood Pressure 141/47 (mmHg): Body Mass Index(BMI):  23 Temperature(F): 97.9 Respiratory Rate 18 (breaths/min): Photos: [1:No Photos] [N/A:N/A] Wound Location: [1:Left Lower Leg - Lateral N/A] Wounding Event: [1:Trauma] [N/A:N/A] Primary Etiology: [1:Trauma, Other] [N/A:N/A] Comorbid History: [1:Arrhythmia, Hypertension, N/A Gout, Osteoarthritis] Date Acquired: [1:04/30/2016] [N/A:N/A] Weeks of Treatment: [1:1] [N/A:N/A] Wound Status: [1:Open] [N/A:N/A] Measurements L x W x D 2.1x3x0.1 [N/A:N/A] (cm) Area (cm) : [1:4.948] [N/A:N/A] Volume (cm) : [1:0.495] [N/A:N/A] % Reduction in Area: [1:31.90%] [N/A:N/A] % Reduction in Volume: 31.80% [N/A:N/A] Classification: [1:Full Thickness Without Exposed Support Structures] [N/A:N/A] Exudate Amount: [1:Large] [N/A:N/A] Exudate Type: [1:Serous] [N/A:N/A] Exudate Color: [1:amber] [N/A:N/A] Wound Margin: [1:Flat and Intact] [N/A:N/A] Granulation Amount: [1:Medium (34-66%)] [N/A:N/A] Granulation Quality: [1:Red] [N/A:N/A] Necrotic Amount: [1:Medium (34-66%)] [N/A:N/A] Necrotic Tissue: [1:Eschar, Adherent Slough N/A] Exposed Structures: [  1:Fascia: No Fat Layer (Subcutaneous Tissue) Exposed: No Tendon: No] [N/A:N/A] Muscle: No Joint: No Bone: No Epithelialization: None N/A N/A Debridement: Debridement (33825- N/A N/A 11047) Pre-procedure 15:56 N/A N/A Verification/Time Out Taken: Pain Control: Lidocaine 4% Topical N/A N/A Solution Tissue Debrided: Necrotic/Eschar, N/A N/A Fibrin/Slough, Subcutaneous Level: Skin/Subcutaneous N/A N/A Tissue Debridement Area (sq 6.3 N/A N/A cm): Instrument: Curette N/A N/A Bleeding: Minimum N/A N/A Hemostasis Achieved: Pressure N/A N/A Procedural Pain: 0 N/A N/A Post Procedural Pain: 0 N/A N/A Debridement Treatment Procedure was tolerated N/A N/A Response: well Post Debridement 2.1x3x0.2 N/A N/A Measurements L x W x D (cm) Post Debridement 0.99 N/A N/A Volume: (cm) Periwound Skin Texture: Excoriation: No N/A N/A Induration: No Callus:  No Crepitus: No Rash: No Scarring: No Periwound Skin Maceration: No N/A N/A Moisture: Dry/Scaly: No Periwound Skin Color: Erythema: Yes N/A N/A Atrophie Blanche: No Cyanosis: No Ecchymosis: No Hemosiderin Staining: No Mottled: No Pallor: No Rubor: No Erythema Location: Circumferential N/A N/A Temperature: No Abnormality N/A N/A Tenderness on Yes N/A N/A Palpation: Joanne Michael, Joanne Michael ([id number]) Wound Preparation: Ulcer Cleansing: N/A N/A Rinsed/Irrigated with Saline Topical Anesthetic Applied: Other: lidocaine 4% Procedures Performed: Debridement N/A N/A Treatment Notes Electronic Signature(s) Signed: 08/06/2016 4:00:56 PM By: Christin Fudge MD, FACS Entered By: Christin Fudge on 08/06/2016 16:00:56 Joanne Michael ([id number]) -------------------------------------------------------------------------------- [place] Details Patient Name: Joanne Michael Date of Service: 08/06/2016 3:45 PM Medical Record Number: [id number] Patient Account Number: [id number] Date of Birth/Sex: Dec 21, 1924 (83 y.o. Female) Treating RN: Montey Hora Primary Care Reakwon Barren: Dion Body Other Clinician: Referring Luan Maberry: Dion Body Treating Niti Leisure/Extender: Frann Rider in Treatment: 1 Active Inactive ` Abuse / Safety / Falls / Self Care Management Nursing Diagnoses: Potential for falls Goals: Patient will remain injury free related to falls Date Initiated: 07/26/2016 Target Resolution Date: 09/28/2016 Goal Status: Active Interventions: Assess fall risk on admission and as needed Notes: ` Orientation to the Wound Care Program Nursing Diagnoses: Knowledge deficit related to the wound healing center program Goals: Patient/caregiver will verbalize understanding of the [place] Program Date Initiated: 07/26/2016 Target Resolution Date: 09/28/2016 Goal Status: Active Interventions: Provide education on orientation to the wound  center Notes: ` Wound/Skin Impairment Nursing Diagnoses: Impaired tissue integrity Joanne Michael, Joanne Michael ([id number]) Goals: Patient/caregiver will verbalize understanding of skin care regimen Date Initiated: 07/26/2016 Target Resolution Date: 09/28/2016 Goal Status: Active Ulcer/skin breakdown will have a volume reduction of 30% by week 4 Date Initiated: 07/26/2016 Target Resolution Date: 09/28/2016 Goal Status: Active Ulcer/skin breakdown will have a volume reduction of 50% by week 8 Date Initiated: 07/26/2016 Target Resolution Date: 09/28/2016 Goal Status: Active Ulcer/skin breakdown will have a volume reduction of 80% by week 12 Date Initiated: 07/26/2016 Target Resolution Date: 09/28/2016 Goal Status: Active Ulcer/skin breakdown will heal within 14 weeks Date Initiated: 07/26/2016 Target Resolution Date: 09/28/2016 Goal Status: Active Interventions: Assess patient/caregiver ability to obtain necessary supplies Assess patient/caregiver ability to perform ulcer/skin care regimen upon admission and as needed Assess ulceration(s) every visit Notes: Electronic Signature(s) Signed: 08/06/2016 4:59:03 PM By: Montey Hora Entered By: Montey Hora on 08/06/2016 15:55:50 Joanne Michael ([id number]) -------------------------------------------------------------------------------- Pain Assessment Details Patient Name: Joanne Michael Date of Service: 08/06/2016 3:45 PM Medical Record Number: [id number] Patient Account Number: [id number] Date of Birth/Sex: [birth year] (83 y.o. Female) Treating RN: Montey Hora Primary Care Sirus Labrie: Dion Body Other Clinician: Referring Kerie Badger: Dion Body Treating Kessler Kopinski/Extender: Frann Rider in Treatment: 1 Active Problems Location of Pain Severity and Description of Pain  Patient Has Paino No Site Locations Pain Management and Medication Current Pain Management: Notes Topical or injectable lidocaine is offered to  patient for acute pain when surgical debridement is performed. If needed, Patient is instructed to use over the counter pain medication for the following 24-48 hours after debridement. Wound care MDs do not prescribed pain medications. Patient has chronic pain or uncontrolled pain. Patient has been instructed to make an appointment with their Primary Care Physician for pain management. Electronic Signature(s) Signed: 08/06/2016 4:59:03 PM By: Montey Hora Entered By: Montey Hora on 08/06/2016 15:44:30 Joanne Michael ([id number]) -------------------------------------------------------------------------------- Patient/Caregiver Education Details Patient Name: Joanne Michael Date of Service: 08/06/2016 3:45 PM Medical Record Number: [id number] Patient Account Number: [id number] Date of Birth/Gender: [birth year] (83 y.o. Female) Treating RN: Montey Hora Primary Care Physician: Dion Body Other Clinician: Referring Physician: Dion Body Treating Physician/Extender: Frann Rider in Treatment: 1 Education Assessment Education Provided To: Patient Education Topics Provided Wound/Skin Impairment: Handouts: Other: wound care as ordered and use compression pumps Methods: Explain/Verbal Responses: State content correctly Electronic Signature(s) Signed: 08/06/2016 4:59:03 PM By: Montey Hora Entered By: Montey Hora on 08/06/2016 16:00:19 Joanne Michael ([id number]) -------------------------------------------------------------------------------- Wound Assessment Details Patient Name: Joanne Michael Date of Service: 08/06/2016 3:45 PM Medical Record Number: [id number] Patient Account Number: [id number] Date of Birth/Sex: 10/19/1924 (83 y.o. Female) Treating RN: Montey Hora Primary Care Lenny Bouchillon: Dion Body Other Clinician: Referring Lyliana Dicenso: Dion Body Treating Veryl Winemiller/Extender: Frann Rider in Treatment: 1 Wound  Status Wound Number: 1 Primary Trauma, Other Etiology: Wound Location: Left Lower Leg - Lateral Wound Status: Open Wounding Event: Trauma Comorbid Arrhythmia, Hypertension, Gout, Date Acquired: 04/30/2016 History: Osteoarthritis Weeks Of Treatment: 1 Clustered Wound: No Photos Photo Uploaded By: Montey Hora on 08/06/2016 16:38:03 Wound Measurements Length: (cm) 2.1 Width: (cm) 3 Depth: (cm) 0.1 Area: (cm) 4.948 Volume: (cm) 0.495 % Reduction in Area: 31.9% % Reduction in Volume: 31.8% Epithelialization: None Tunneling: No Undermining: No Wound Description Full Thickness Without Exposed Classification: Support Structures Wound Margin: Flat and Intact Exudate Large Amount: Exudate Type: Serous Exudate Color: amber Foul Odor After Cleansing: No Slough/Fibrino Yes Wound Bed Granulation Amount: Medium (34-66%) Exposed Structure Granulation Quality: Red Fascia Exposed: No Necrotic Amount: Medium (34-66%) Fat Layer (Subcutaneous Tissue) Exposed: No Joanne Michael, Joanne Michael ([id number]) Necrotic Quality: Eschar, Adherent Slough Tendon Exposed: No Muscle Exposed: No Joint Exposed: No Bone Exposed: No Periwound Skin Texture Texture Color No Abnormalities Noted: No No Abnormalities Noted: No Callus: No Atrophie Blanche: No Crepitus: No Cyanosis: No Excoriation: No Ecchymosis: No Induration: No Erythema: Yes Rash: No Erythema Location: Circumferential Scarring: No Hemosiderin Staining: No Mottled: No Moisture Pallor: No No Abnormalities Noted: No Rubor: No Dry / Scaly: No Maceration: No Temperature / Pain Temperature: No Abnormality Tenderness on Palpation: Yes Wound Preparation Ulcer Cleansing: Rinsed/Irrigated with Saline Topical Anesthetic Applied: Other: lidocaine 4%, Treatment Notes Wound #1 (Left, Lateral Lower Leg) 1. Cleansed with: Clean wound with Normal Saline 2. Anesthetic Topical Lidocaine 4% cream to wound bed prior to debridement 4.  Dressing Applied: Santyl Ointment 5. Secondary Dressing Applied Guaze, ABD and kerlix/Conform 7. Secured with Recruitment consultant) Signed: 08/06/2016 4:59:03 PM By: Montey Hora Entered By: Montey Hora on 08/06/2016 15:54:35 Joanne Michael ([id number]) -------------------------------------------------------------------------------- [place] Details Patient Name: Joanne Michael Date of Service: 08/06/2016 3:45 PM Medical Record Number: [id number] Patient Account Number: [id number] Date of Birth/Sex: Jul 20, 1924 (83 y.o. Female) Treating RN: Montey Hora Primary Care Canary Fister: Dion Body Other Clinician: Referring Allani Reber: Dion Body  Treating Makelle Marrone/Extender: Frann Rider in Treatment: 1 Vital Signs Time Taken: 15:45 Temperature (F): 97.9 Height (in): 60 Pulse (bpm): 63 Weight (lbs): 116 Respiratory Rate (breaths/min): 18 Body Mass Index (BMI): 22.7 Blood Pressure (mmHg): 141/47 Reference Range: 80 - 120 mg / dl Electronic Signature(s) Signed: 08/06/2016 4:59:03 PM By: Montey Hora Entered By: Montey Hora on 08/06/2016 15:47:12

## 2016-08-20 ENCOUNTER — Encounter: Payer: Medicare Other | Admitting: Physician Assistant

## 2016-08-20 DIAGNOSIS — L97222 Non-pressure chronic ulcer of left calf with fat layer exposed: Secondary | ICD-10-CM | POA: Diagnosis not present

## 2016-08-21 ENCOUNTER — Emergency Department: Payer: Medicare Other

## 2016-08-21 ENCOUNTER — Encounter: Payer: Self-pay | Admitting: Emergency Medicine

## 2016-08-21 ENCOUNTER — Inpatient Hospital Stay
Admission: EM | Admit: 2016-08-21 | Discharge: 2016-08-30 | DRG: 193 | Disposition: A | Discharge status: Home / Self Care | Payer: Medicare Other | Attending: Internal Medicine | Admitting: Internal Medicine

## 2016-08-21 DIAGNOSIS — Z9049 Acquired absence of other specified parts of digestive tract: Secondary | ICD-10-CM

## 2016-08-21 DIAGNOSIS — R079 Chest pain, unspecified: Secondary | ICD-10-CM | POA: Diagnosis present

## 2016-08-21 DIAGNOSIS — D631 Anemia in chronic kidney disease: Secondary | ICD-10-CM | POA: Diagnosis present

## 2016-08-21 DIAGNOSIS — N184 Chronic kidney disease, stage 4 (severe): Secondary | ICD-10-CM | POA: Diagnosis present

## 2016-08-21 DIAGNOSIS — R778 Other specified abnormalities of plasma proteins: Secondary | ICD-10-CM

## 2016-08-21 DIAGNOSIS — M109 Gout, unspecified: Secondary | ICD-10-CM | POA: Diagnosis present

## 2016-08-21 DIAGNOSIS — I130 Hypertensive heart and chronic kidney disease with heart failure and stage 1 through stage 4 chronic kidney disease, or unspecified chronic kidney disease: Secondary | ICD-10-CM | POA: Diagnosis present

## 2016-08-21 DIAGNOSIS — R0603 Acute respiratory distress: Secondary | ICD-10-CM

## 2016-08-21 DIAGNOSIS — Z90710 Acquired absence of both cervix and uterus: Secondary | ICD-10-CM

## 2016-08-21 DIAGNOSIS — E871 Hypo-osmolality and hyponatremia: Secondary | ICD-10-CM | POA: Diagnosis present

## 2016-08-21 DIAGNOSIS — T502X5A Adverse effect of carbonic-anhydrase inhibitors, benzothiadiazides and other diuretics, initial encounter: Secondary | ICD-10-CM | POA: Diagnosis present

## 2016-08-21 DIAGNOSIS — N19 Unspecified kidney failure: Secondary | ICD-10-CM

## 2016-08-21 DIAGNOSIS — N179 Acute kidney failure, unspecified: Secondary | ICD-10-CM | POA: Diagnosis present

## 2016-08-21 DIAGNOSIS — F0390 Unspecified dementia without behavioral disturbance: Secondary | ICD-10-CM | POA: Diagnosis present

## 2016-08-21 DIAGNOSIS — R748 Abnormal levels of other serum enzymes: Secondary | ICD-10-CM | POA: Diagnosis present

## 2016-08-21 DIAGNOSIS — Z96653 Presence of artificial knee joint, bilateral: Secondary | ICD-10-CM | POA: Diagnosis present

## 2016-08-21 DIAGNOSIS — Z79899 Other long term (current) drug therapy: Secondary | ICD-10-CM

## 2016-08-21 DIAGNOSIS — Z7951 Long term (current) use of inhaled steroids: Secondary | ICD-10-CM

## 2016-08-21 DIAGNOSIS — J189 Pneumonia, unspecified organism: Secondary | ICD-10-CM | POA: Diagnosis not present

## 2016-08-21 DIAGNOSIS — E876 Hypokalemia: Secondary | ICD-10-CM | POA: Diagnosis present

## 2016-08-21 DIAGNOSIS — Z85820 Personal history of malignant melanoma of skin: Secondary | ICD-10-CM

## 2016-08-21 DIAGNOSIS — D509 Iron deficiency anemia, unspecified: Secondary | ICD-10-CM | POA: Diagnosis present

## 2016-08-21 DIAGNOSIS — R7989 Other specified abnormal findings of blood chemistry: Secondary | ICD-10-CM | POA: Diagnosis present

## 2016-08-21 DIAGNOSIS — I480 Paroxysmal atrial fibrillation: Secondary | ICD-10-CM | POA: Diagnosis present

## 2016-08-21 DIAGNOSIS — E785 Hyperlipidemia, unspecified: Secondary | ICD-10-CM | POA: Diagnosis present

## 2016-08-21 DIAGNOSIS — I4891 Unspecified atrial fibrillation: Secondary | ICD-10-CM | POA: Diagnosis present

## 2016-08-21 DIAGNOSIS — Z8041 Family history of malignant neoplasm of ovary: Secondary | ICD-10-CM

## 2016-08-21 DIAGNOSIS — Z885 Allergy status to narcotic agent status: Secondary | ICD-10-CM

## 2016-08-21 DIAGNOSIS — E1122 Type 2 diabetes mellitus with diabetic chronic kidney disease: Secondary | ICD-10-CM | POA: Diagnosis present

## 2016-08-21 DIAGNOSIS — N183 Chronic kidney disease, stage 3 unspecified: Secondary | ICD-10-CM

## 2016-08-21 DIAGNOSIS — N189 Chronic kidney disease, unspecified: Secondary | ICD-10-CM

## 2016-08-21 DIAGNOSIS — Z801 Family history of malignant neoplasm of trachea, bronchus and lung: Secondary | ICD-10-CM

## 2016-08-21 DIAGNOSIS — R0602 Shortness of breath: Secondary | ICD-10-CM

## 2016-08-21 DIAGNOSIS — Z800 Family history of malignant neoplasm of digestive organs: Secondary | ICD-10-CM

## 2016-08-21 DIAGNOSIS — Z8249 Family history of ischemic heart disease and other diseases of the circulatory system: Secondary | ICD-10-CM

## 2016-08-21 DIAGNOSIS — Z8042 Family history of malignant neoplasm of prostate: Secondary | ICD-10-CM

## 2016-08-21 DIAGNOSIS — K219 Gastro-esophageal reflux disease without esophagitis: Secondary | ICD-10-CM | POA: Diagnosis present

## 2016-08-21 DIAGNOSIS — R0781 Pleurodynia: Secondary | ICD-10-CM

## 2016-08-21 DIAGNOSIS — Z7902 Long term (current) use of antithrombotics/antiplatelets: Secondary | ICD-10-CM

## 2016-08-21 DIAGNOSIS — R001 Bradycardia, unspecified: Secondary | ICD-10-CM | POA: Diagnosis present

## 2016-08-21 DIAGNOSIS — I5033 Acute on chronic diastolic (congestive) heart failure: Secondary | ICD-10-CM | POA: Diagnosis present

## 2016-08-21 DIAGNOSIS — Z888 Allergy status to other drugs, medicaments and biological substances status: Secondary | ICD-10-CM

## 2016-08-21 DIAGNOSIS — M1990 Unspecified osteoarthritis, unspecified site: Secondary | ICD-10-CM | POA: Diagnosis present

## 2016-08-21 DIAGNOSIS — Z9889 Other specified postprocedural states: Secondary | ICD-10-CM

## 2016-08-21 LAB — BASIC METABOLIC PANEL
Anion gap: 9 (ref 5–15)
BUN: 32 mg/dL — AB (ref 6–20)
CALCIUM: 9.1 mg/dL (ref 8.9–10.3)
CO2: 21 mmol/L — ABNORMAL LOW (ref 22–32)
CREATININE: 1.77 mg/dL — AB (ref 0.44–1.00)
Chloride: 97 mmol/L — ABNORMAL LOW (ref 101–111)
GFR calc Af Amer: 28 mL/min — ABNORMAL LOW (ref >60)
GFR, EST NON AFRICAN AMERICAN: 24 mL/min — AB (ref >60)
GLUCOSE: 102 mg/dL — AB (ref 65–99)
Potassium: 4 mmol/L (ref 3.5–5.1)
SODIUM: 127 mmol/L — AB (ref 135–145)

## 2016-08-21 LAB — CBC
HEMATOCRIT: 30.5 % — AB (ref 35.0–47.0)
Hemoglobin: 10.6 g/dL — ABNORMAL LOW (ref 12.0–16.0)
MCH: 32.1 pg (ref 26.0–34.0)
MCHC: 34.9 g/dL (ref 32.0–36.0)
MCV: 91.9 fL (ref 80.0–100.0)
PLATELETS: 254 10*3/uL (ref 150–440)
RBC: 3.32 MIL/uL — ABNORMAL LOW (ref 3.80–5.20)
RDW: 13.5 % (ref 11.5–14.5)
WBC: 13.1 10*3/uL — AB (ref 3.6–11.0)

## 2016-08-21 LAB — TROPONIN I
TROPONIN I: 0.04 ng/mL — AB (ref <0.03)
Troponin I: 0.03 ng/mL (ref <0.03)

## 2016-08-21 LAB — FIBRIN DERIVATIVES D-DIMER (ARMC ONLY): FIBRIN DERIVATIVES D-DIMER (ARMC): 1425.19 — AB (ref 0.00–499.00)

## 2016-08-21 LAB — BRAIN NATRIURETIC PEPTIDE: B NATRIURETIC PEPTIDE 5: 1207 pg/mL — AB (ref 0.0–100.0)

## 2016-08-21 MED ORDER — FLUTICASONE PROPIONATE 50 MCG/ACT NA SUSP
2.0000 | Freq: Every evening | NASAL | Status: DC | PRN
  Filled 2016-08-21: qty 16

## 2016-08-21 MED ORDER — ADULT MULTIVITAMIN W/MINERALS CH
1.0000 | ORAL_TABLET | Freq: Every day | ORAL | Status: DC
  Administered 2016-08-22 – 2016-08-30 (×9): 1 via ORAL
  Filled 2016-08-21 (×9): qty 1

## 2016-08-21 MED ORDER — CARVEDILOL 3.125 MG PO TABS
3.1250 mg | ORAL_TABLET | Freq: Two times a day (BID) | ORAL | Status: DC
  Administered 2016-08-21 – 2016-08-22 (×2): 3.125 mg via ORAL
  Filled 2016-08-21 (×2): qty 1

## 2016-08-21 MED ORDER — ALBUTEROL SULFATE (2.5 MG/3ML) 0.083% IN NEBU: 3.0000 mL | INHALATION_SOLUTION | Freq: Four times a day (QID) | RESPIRATORY_TRACT | Status: DC | PRN

## 2016-08-21 MED ORDER — HEPARIN SODIUM (PORCINE) 5000 UNIT/ML IJ SOLN
5000.0000 [IU] | Freq: Three times a day (TID) | INTRAMUSCULAR | Status: DC
  Administered 2016-08-21 – 2016-08-23 (×5): 5000 [IU] via SUBCUTANEOUS
  Filled 2016-08-21 (×5): qty 1

## 2016-08-21 MED ORDER — CALCIUM CARBONATE-VITAMIN D 500-200 MG-UNIT PO TABS
1.0000 | ORAL_TABLET | Freq: Every day | ORAL | Status: DC
  Administered 2016-08-22 – 2016-08-30 (×9): 1 via ORAL
  Filled 2016-08-21 (×9): qty 1

## 2016-08-21 MED ORDER — FUROSEMIDE 20 MG PO TABS: 20.0000 mg | ORAL_TABLET | ORAL | Status: DC

## 2016-08-21 MED ORDER — ATORVASTATIN CALCIUM 20 MG PO TABS
20.0000 mg | ORAL_TABLET | Freq: Every day | ORAL | Status: DC
  Administered 2016-08-21 – 2016-08-29 (×9): 20 mg via ORAL
  Filled 2016-08-21 (×9): qty 1

## 2016-08-21 MED ORDER — PANTOPRAZOLE SODIUM 40 MG PO TBEC
40.0000 mg | DELAYED_RELEASE_TABLET | Freq: Every day | ORAL | Status: DC
  Administered 2016-08-21 – 2016-08-29 (×9): 40 mg via ORAL
  Filled 2016-08-21 (×9): qty 1

## 2016-08-21 MED ORDER — FERROUS SULFATE 325 (65 FE) MG PO TABS
325.0000 mg | ORAL_TABLET | Freq: Every day | ORAL | Status: DC
  Administered 2016-08-22 – 2016-08-30 (×9): 325 mg via ORAL
  Filled 2016-08-21 (×9): qty 1

## 2016-08-21 MED ORDER — SODIUM CHLORIDE 0.9 % IV BOLUS (SEPSIS)
1000.0000 mL | Freq: Once | INTRAVENOUS | Status: AC
  Administered 2016-08-21: 1000 mL via INTRAVENOUS

## 2016-08-21 MED ORDER — LORATADINE 10 MG PO TABS
10.0000 mg | ORAL_TABLET | Freq: Every day | ORAL | Status: DC
  Administered 2016-08-22 – 2016-08-30 (×9): 10 mg via ORAL
  Filled 2016-08-21 (×9): qty 1

## 2016-08-21 MED ORDER — CALCIUM CARBONATE-VITAMIN D 600-400 MG-UNIT PO TABS: 1.0000 | ORAL_TABLET | Freq: Every day | ORAL | Status: DC

## 2016-08-21 MED ORDER — ONDANSETRON HCL 4 MG PO TABS: 4.0000 mg | ORAL_TABLET | Freq: Four times a day (QID) | ORAL | Status: DC | PRN

## 2016-08-21 MED ORDER — ACETAMINOPHEN 650 MG RE SUPP: 650.0000 mg | Freq: Four times a day (QID) | RECTAL | Status: DC | PRN

## 2016-08-21 MED ORDER — COLLAGENASE 250 UNIT/GM EX OINT
1.0000 "application " | TOPICAL_OINTMENT | Freq: Every day | CUTANEOUS | Status: DC
  Administered 2016-08-22 – 2016-08-29 (×8): 1 via TOPICAL
  Filled 2016-08-21 (×2): qty 30

## 2016-08-21 MED ORDER — ASPIRIN EC 81 MG PO TBEC
81.0000 mg | DELAYED_RELEASE_TABLET | Freq: Every day | ORAL | Status: DC
  Administered 2016-08-22 – 2016-08-23 (×2): 81 mg via ORAL
  Filled 2016-08-21 (×2): qty 1

## 2016-08-21 MED ORDER — ACETAMINOPHEN 325 MG PO TABS: 650.0000 mg | ORAL_TABLET | Freq: Four times a day (QID) | ORAL | Status: DC | PRN

## 2016-08-21 MED ORDER — HYDRALAZINE HCL 25 MG PO TABS
25.0000 mg | ORAL_TABLET | Freq: Two times a day (BID) | ORAL | Status: DC
  Administered 2016-08-21 – 2016-08-23 (×5): 25 mg via ORAL
  Filled 2016-08-21 (×5): qty 1

## 2016-08-21 MED ORDER — CLONIDINE HCL 0.1 MG PO TABS
0.2000 mg | ORAL_TABLET | Freq: Three times a day (TID) | ORAL | Status: DC
  Administered 2016-08-21 – 2016-08-22 (×2): 0.2 mg via ORAL
  Filled 2016-08-21 (×2): qty 2

## 2016-08-21 MED ORDER — ASPIRIN 81 MG PO CHEW
162.0000 mg | CHEWABLE_TABLET | Freq: Once | ORAL | Status: AC
  Administered 2016-08-21: 162 mg via ORAL
  Filled 2016-08-21: qty 2

## 2016-08-21 MED ORDER — CLOPIDOGREL BISULFATE 75 MG PO TABS
75.0000 mg | ORAL_TABLET | Freq: Every day | ORAL | Status: DC
  Administered 2016-08-22 – 2016-08-26 (×5): 75 mg via ORAL
  Filled 2016-08-21 (×5): qty 1

## 2016-08-21 MED ORDER — NITROGLYCERIN 0.4 MG SL SUBL
0.4000 mg | SUBLINGUAL_TABLET | SUBLINGUAL | Status: DC | PRN
  Administered 2016-08-26: 0.4 mg via SUBLINGUAL
  Filled 2016-08-21: qty 1

## 2016-08-21 MED ORDER — ACETAMINOPHEN 500 MG PO TABS
1000.0000 mg | ORAL_TABLET | Freq: Once | ORAL | Status: AC
  Administered 2016-08-21: 1000 mg via ORAL
  Filled 2016-08-21: qty 2

## 2016-08-21 MED ORDER — ALLOPURINOL 100 MG PO TABS
100.0000 mg | ORAL_TABLET | Freq: Two times a day (BID) | ORAL | Status: DC
  Administered 2016-08-21 – 2016-08-30 (×18): 100 mg via ORAL
  Filled 2016-08-21 (×19): qty 1

## 2016-08-21 MED ORDER — ONDANSETRON HCL 4 MG/2ML IJ SOLN: 4.0000 mg | Freq: Four times a day (QID) | INTRAMUSCULAR | Status: DC | PRN

## 2016-08-21 NOTE — ED Triage Notes (Signed)
Pt c/o left sided chest pain under breast since yesterday.  Has had SHOB as well.  Difficulty speaking full sentences in triage.  Feels like when has had PNA before.  VSS.  Mildly labored.

## 2016-08-21 NOTE — Progress Notes (Signed)
Joanne, Michael ([id number]) Visit Report for 08/20/2016 Arrival Information Details Patient Name: Joanne Michael, Joanne Michael Date of Service: 08/20/2016 12:30 PM Medical Record Number: [id number] Patient Account Number: [id number] Date of Birth/Sex: 1924/09/25 (83 y.o. Female) Treating RN: Montey Hora Primary Care Lino Wickliff: Dion Body Other Clinician: Referring Calahan Pak: Dion Body Treating Rhianne Soman/Extender: Frann Rider in Treatment: 3 Visit Information History Since Last Visit Added or deleted any medications: No Patient Arrived: Ambulatory Any new allergies or adverse reactions: No Arrival Time: 12:36 Had a fall or experienced change in No Accompanied By: self activities of daily living that may affect Transfer Assistance: None risk of falls: Patient Identification Verified: Yes Signs or symptoms of abuse/neglect since last No Secondary Verification Yes visito Process Completed: Hospitalized since last visit: No Patient Has Alerts: Yes Has Dressing in Place as Prescribed: Yes Patient Alerts: ABI [place] BILATERAL Pain Present Now: No >220 Electronic Signature(s) Signed: 08/20/2016 5:16:03 PM By: Montey Hora Entered By: Montey Hora on 08/20/2016 12:37:55 Joanne Michael ([id number]) -------------------------------------------------------------------------------- Encounter Discharge Information Details Patient Name: Joanne Michael Date of Service: 08/20/2016 12:30 PM Medical Record Number: [id number] Patient Account Number: [id number] Date of Birth/Sex: Sep 17, 1924 (83 y.o. Female) Treating RN: Montey Hora Primary Care Roosvelt Churchwell: Dion Body Other Clinician: Referring Hanny Elsberry: Dion Body Treating Carlea Badour/Extender: Melburn Hake, Joanne Weeks in Treatment: 3 Encounter Discharge Information Items Discharge Pain Level: 0 Discharge Condition: Stable Ambulatory Status: Ambulatory Discharge Destination: Home Transportation: Private  Auto Accompanied By: self Schedule Follow-up Appointment: Yes Medication Reconciliation completed and provided to Patient/Care No Anzlee Hinesley: Provided on Clinical Summary of Care: 08/20/2016 Form Type Recipient Paper Patient JM Electronic Signature(s) Signed: 08/20/2016 1:03:41 PM By: Ruthine Dose Entered By: Ruthine Dose on 08/20/2016 13:03:41 Joanne Michael ([id number]) -------------------------------------------------------------------------------- Lower Extremity Assessment Details Patient Name: Joanne Michael Date of Service: 08/20/2016 12:30 PM Medical Record Number: [id number] Patient Account Number: [id number] Date of Birth/Sex: 10-31-1924 (83 y.o. Female) Treating RN: Montey Hora Primary Care Linda Biehn: Dion Body Other Clinician: Referring Kash Mothershead: Dion Body Treating Zaylyn Bergdoll/Extender: Melburn Hake, Joanne Weeks in Treatment: 3 Vascular Assessment Pulses: Dorsalis Pedis Palpable: [Left:Yes] Posterior Tibial Extremity colors, hair growth, and conditions: Extremity Color: [Left:Hyperpigmented] Hair Growth on Extremity: [Left:No] Temperature of Extremity: [Left:Warm] Capillary Refill: [Left:< 3 seconds] Electronic Signature(s) Signed: 08/20/2016 5:16:03 PM By: Montey Hora Entered By: Montey Hora on 08/20/2016 12:42:33 Joanne Michael ([id number]) -------------------------------------------------------------------------------- Multi Wound Chart Details Patient Name: Joanne Michael Date of Service: 08/20/2016 12:30 PM Medical Record Number: [id number] Patient Account Number: [id number] Date of Birth/Sex: Jul 17, 1924 (83 y.o. Female) Treating RN: Montey Hora Primary Care Signe Tackitt: Dion Body Other Clinician: Referring Adrick Kestler: Dion Body Treating Michole Lecuyer/Extender: Melburn Hake, Joanne Weeks in Treatment: 3 Vital Signs Height(in): 60 Pulse(bpm): 61 Weight(lbs): 116 Blood Pressure 145/84 (mmHg): Body Mass Index(BMI):  23 Temperature(F): 98.1 Respiratory Rate 18 (breaths/min): Photos: [1:No Photos] [N/A:N/A] Wound Location: [1:Left Lower Leg - Lateral N/A] Wounding Event: [1:Trauma] [N/A:N/A] Primary Etiology: [1:Trauma, Other] [N/A:N/A] Comorbid History: [1:Arrhythmia, Hypertension, N/A Gout, Osteoarthritis] Date Acquired: [1:04/30/2016] [N/A:N/A] Weeks of Treatment: [1:3] [N/A:N/A] Wound Status: [1:Open] [N/A:N/A] Measurements L x W x D 1.9x2.7x0.1 [N/A:N/A] (cm) Area (cm) : [1:4.029] [N/A:N/A] Volume (cm) : [1:0.403] [N/A:N/A] % Reduction in Area: [1:44.50%] [N/A:N/A] % Reduction in Volume: 44.50% [N/A:N/A] Classification: [1:Full Thickness Without Exposed Support Structures] [N/A:N/A] Exudate Amount: [1:Large] [N/A:N/A] Exudate Type: [1:Serous] [N/A:N/A] Exudate Color: [1:amber] [N/A:N/A] Wound Margin: [1:Flat and Intact] [N/A:N/A] Granulation Amount: [1:Medium (34-66%)] [N/A:N/A] Granulation Quality: [1:Red] [N/A:N/A] Necrotic Amount: [1:Medium (34-66%)] [N/A:N/A] Necrotic Tissue: [1:Eschar, Adherent Slough  N/A] Exposed Structures: [1:Fascia: No Fat Layer (Subcutaneous Tissue) Exposed: No Tendon: No] [N/A:N/A] Muscle: No Joint: No Bone: No Epithelialization: Small (1-33%) N/A N/A Periwound Skin Texture: Excoriation: No N/A N/A Induration: No Callus: No Crepitus: No Rash: No Scarring: No Periwound Skin Maceration: No N/A N/A Moisture: Dry/Scaly: No Periwound Skin Color: Erythema: Yes N/A N/A Atrophie Blanche: No Cyanosis: No Ecchymosis: No Hemosiderin Staining: No Mottled: No Pallor: No Rubor: No Erythema Location: Circumferential N/A N/A Temperature: No Abnormality N/A N/A Tenderness on Yes N/A N/A Palpation: Wound Preparation: Ulcer Cleansing: N/A N/A Rinsed/Irrigated with Saline Topical Anesthetic Applied: Other: lidocaine 4% Treatment Notes Electronic Signature(s) Signed: 08/20/2016 5:16:03 PM By: Montey Hora Entered By: Montey Hora on 08/20/2016  12:42:47 Joanne Michael ([id number]) -------------------------------------------------------------------------------- [place] Details Patient Name: Joanne Michael Date of Service: 08/20/2016 12:30 PM Medical Record Number: [id number] Patient Account Number: [id number] Date of Birth/Sex: [birth year] (83 y.o. Female) Treating RN: Montey Hora Primary Care Anni Hocevar: Dion Body Other Clinician: Referring Vessie Olmsted: Dion Body Treating Breannah Kratt/Extender: Melburn Hake, Joanne Weeks in Treatment: 3 Active Inactive ` Abuse / Safety / Falls / Self Care Management Nursing Diagnoses: Potential for falls Goals: Patient will remain injury free related to falls Date Initiated: 07/26/2016 Target Resolution Date: 09/28/2016 Goal Status: Active Interventions: Assess fall risk on admission and as needed Notes: ` Orientation to the Wound Care Program Nursing Diagnoses: Knowledge deficit related to the wound healing center program Goals: Patient/caregiver will verbalize understanding of the [facility] Program Date Initiated: 07/26/2016 Target Resolution Date: 09/28/2016 Goal Status: Active Interventions: Provide education on orientation to the wound center Notes: ` Wound/Skin Impairment Nursing Diagnoses: Impaired tissue integrity Joanne Michael, Joanne Michael ([id number]) Goals: Patient/caregiver will verbalize understanding of skin care regimen Date Initiated: 07/26/2016 Target Resolution Date: 09/28/2016 Goal Status: Active Ulcer/skin breakdown will have a volume reduction of 30% by week 4 Date Initiated: 07/26/2016 Target Resolution Date: 09/28/2016 Goal Status: Active Ulcer/skin breakdown will have a volume reduction of 50% by week 8 Date Initiated: 07/26/2016 Target Resolution Date: 09/28/2016 Goal Status: Active Ulcer/skin breakdown will have a volume reduction of 80% by week 12 Date Initiated: 07/26/2016 Target Resolution Date: 09/28/2016 Goal Status:  Active Ulcer/skin breakdown will heal within 14 weeks Date Initiated: 07/26/2016 Target Resolution Date: 09/28/2016 Goal Status: Active Interventions: Assess patient/caregiver ability to obtain necessary supplies Assess patient/caregiver ability to perform ulcer/skin care regimen upon admission and as needed Assess ulceration(s) every visit Notes: Electronic Signature(s) Signed: 08/20/2016 5:16:03 PM By: Montey Hora Entered By: Montey Hora on 08/20/2016 12:42:37 Joanne Michael ([id number]) -------------------------------------------------------------------------------- Pain Assessment Details Patient Name: Joanne Michael Date of Service: 08/20/2016 12:30 PM Medical Record Number: [id number] Patient Account Number: [id number] Date of Birth/Sex: 1924/10/27 (83 y.o. Female) Treating RN: Montey Hora Primary Care Akira Adelsberger: Dion Body Other Clinician: Referring Kaizen Ibsen: Dion Body Treating Batul Diego/Extender: Frann Rider in Treatment: 3 Active Problems Location of Pain Severity and Description of Pain Patient Has Paino No Site Locations Pain Management and Medication Current Pain Management: Notes Topical or injectable lidocaine is offered to patient for acute pain when surgical debridement is performed. If needed, Patient is instructed to use over the counter pain medication for the following 24-48 hours after debridement. Wound care MDs do not prescribed pain medications. Patient has chronic pain or uncontrolled pain. Patient has been instructed to make an appointment with their Primary Care Physician for pain management. Electronic Signature(s) Signed: 08/20/2016 5:16:03 PM By: Montey Hora Entered By: Montey Hora on 08/20/2016 12:38:02 Joanne Carmel  Jenetta Michael ([id number]) -------------------------------------------------------------------------------- Patient/Caregiver Education Details Patient Name: SAFINA, HUARD Date of Service:  08/20/2016 12:30 PM Medical Record Number: [id number] Patient Account Number: [id number] Date of Birth/Gender: [birth year] (83 y.o. Female) Treating RN: Montey Hora Primary Care Physician: Dion Body Other Clinician: Referring Physician: Dion Body Treating Physician/Extender: Sharalyn Ink in Treatment: 3 Education Assessment Education Provided To: Patient Education Topics Provided Wound/Skin Impairment: Handouts: Other: wound care as ordered Methods: Demonstration, Explain/Verbal Responses: State content correctly Electronic Signature(s) Signed: 08/20/2016 5:16:03 PM By: Montey Hora Entered By: Montey Hora on 08/20/2016 12:43:24 Joanne Michael ([id number]) -------------------------------------------------------------------------------- Wound Assessment Details Patient Name: Joanne Michael Date of Service: 08/20/2016 12:30 PM Medical Record Number: [id number] Patient Account Number: [id number] Date of Birth/Sex: 1925/02/11 (83 y.o. Female) Treating RN: Montey Hora Primary Care Koen Antilla: Dion Body Other Clinician: Referring Nile Dorning: Dion Body Treating Frazier Balfour/Extender: Melburn Hake, Joanne Weeks in Treatment: 3 Wound Status Wound Number: 1 Primary Trauma, Other Etiology: Wound Location: Left Lower Leg - Lateral Wound Status: Open Wounding Event: Trauma Comorbid Arrhythmia, Hypertension, Gout, Date Acquired: 04/30/2016 History: Osteoarthritis Weeks Of Treatment: 3 Clustered Wound: No Photos Photo Uploaded By: Montey Hora on 08/20/2016 13:23:35 Wound Measurements Length: (cm) 1.9 Width: (cm) 2.7 Depth: (cm) 0.1 Area: (cm) 4.029 Volume: (cm) 0.403 % Reduction in Area: 44.5% % Reduction in Volume: 44.5% Epithelialization: Small (1-33%) Tunneling: No Undermining: No Wound Description Full Thickness Without Exposed Classification: Support Structures Wound Margin: Flat and  Intact Exudate Large Amount: Exudate Type: Serous Exudate Color: amber Foul Odor After Cleansing: No Slough/Fibrino Yes Wound Bed Granulation Amount: Medium (34-66%) Exposed Structure Granulation Quality: Red Fascia Exposed: No Necrotic Amount: Medium (34-66%) Fat Layer (Subcutaneous Tissue) Exposed: No Joanne Michael, Joanne Michael ([id number]) Necrotic Quality: Eschar, Adherent Slough Tendon Exposed: No Muscle Exposed: No Joint Exposed: No Bone Exposed: No Periwound Skin Texture Texture Color No Abnormalities Noted: No No Abnormalities Noted: No Callus: No Atrophie Blanche: No Crepitus: No Cyanosis: No Excoriation: No Ecchymosis: No Induration: No Erythema: Yes Rash: No Erythema Location: Circumferential Scarring: No Hemosiderin Staining: No Mottled: No Moisture Pallor: No No Abnormalities Noted: No Rubor: No Dry / Scaly: No Maceration: No Temperature / Pain Temperature: No Abnormality Tenderness on Palpation: Yes Wound Preparation Ulcer Cleansing: Rinsed/Irrigated with Saline Topical Anesthetic Applied: Other: lidocaine 4%, Treatment Notes Wound #1 (Left, Lateral Lower Leg) 1. Cleansed with: Clean wound with Normal Saline 2. Anesthetic Topical Lidocaine 4% cream to wound bed prior to debridement 4. Dressing Applied: Santyl Ointment 5. Secondary Dressing Applied Gauze and Kerlix/Conform 7. Secured with Recruitment consultant) Signed: 08/20/2016 5:16:03 PM By: Montey Hora Entered By: Montey Hora on 08/20/2016 12:42:19 Joanne Michael ([id number]) -------------------------------------------------------------------------------- [place] Details Patient Name: Joanne Michael Date of Service: 08/20/2016 12:30 PM Medical Record Number: [id number] Patient Account Number: [id number] Date of Birth/Sex: 1924/10/21 (83 y.o. Female) Treating RN: Montey Hora Primary Care Oneal Biglow: Dion Body Other Clinician: Referring Elyzabeth Goatley: Dion Body Treating Kyung Muto/Extender: Frann Rider in Treatment: 3 Vital Signs Time Taken: 12:38 Temperature (F): 98.1 Height (in): 60 Pulse (bpm): 61 Weight (lbs): 116 Respiratory Rate (breaths/min): 18 Body Mass Index (BMI): 22.7 Blood Pressure (mmHg): 145/84 Reference Range: 80 - 120 mg / dl Electronic Signature(s) Signed: 08/20/2016 5:16:03 PM By: Montey Hora Entered By: Montey Hora on 08/20/2016 12:39:08

## 2016-08-21 NOTE — ED Notes (Signed)
This RN to bedside at this time. DC pt fluids. Pt's family requesting to know if patient will be having a CT scan, explained to family will have MD come and speak to them regarding whether or not patient will have CT. Pt and family state understanding. Pt resting in bed, awakens with mild stimuli. Pt noted to continue to be [facility] with exertion.

## 2016-08-21 NOTE — Progress Notes (Signed)
Joanne Michael, Joanne Michael ([id number]) Visit Report for 08/20/2016 Chief Complaint Document Details Patient Name: Joanne Michael, Joanne Michael. Date of Service: 08/20/2016 12:30 PM Medical Record Number: [id number] Patient Account Number: [id number] Date of Birth/Sex: 07-18-1924 (83 y.o. Female) Treating RN: Montey Hora Primary Care Provider: Dion Body Other Clinician: Referring Provider: Dion Body Treating Provider/Extender: Melburn Hake, Joanne Weeks in Treatment: 3 Information Obtained from: Patient Chief Complaint Patient presents to the wound care center due with non-wound condition(s) the left lower extremity Electronic Signature(s) Signed: 08/20/2016 5:09:57 PM By: Worthy Keeler PA-C Entered By: Worthy Keeler on 08/20/2016 12:52:18 Joanne Michael ([id number]) -------------------------------------------------------------------------------- Debridement Details Patient Name: Joanne Michael Date of Service: 08/20/2016 12:30 PM Medical Record Number: [id number] Patient Account Number: [id number] Date of Birth/Sex: 1924/11/09 (83 y.o. Female) Treating RN: Montey Hora Primary Care Provider: Dion Body Other Clinician: Referring Provider: Dion Body Treating Provider/Extender: Melburn Hake, Joanne Weeks in Treatment: 3 Debridement Performed for Wound #1 Left,Lateral Lower Leg Assessment: Performed By: Physician STONE III, Joanne E., PA-C Debridement: Debridement Pre-procedure Verification/Time Out Yes - 12:54 Taken: Start Time: 12:54 Pain Control: Lidocaine 4% Topical Solution Level: Skin/Subcutaneous Tissue Total Area Debrided (L x 1.9 (cm) x 2.7 (cm) = 5.13 (cm) W): Tissue and other Viable, Non-Viable, Fibrin/Slough, Subcutaneous material debrided: Instrument: Curette Bleeding: Minimum Hemostasis Achieved: Pressure End Time: 12:57 Procedural Pain: 0 Post Procedural Pain: 0 Response to Treatment: Procedure was tolerated well Post Debridement  Measurements of Total Wound Length: (cm) 1.9 Width: (cm) 2.7 Depth: (cm) 0.2 Volume: (cm) 0.806 Character of Wound/Ulcer Post Improved Debridement: Post Procedure Diagnosis Same as Pre-procedure Electronic Signature(s) Signed: 08/20/2016 5:09:57 PM By: Worthy Keeler PA-C Signed: 08/20/2016 5:16:03 PM By: Montey Hora Entered By: Montey Hora on 08/20/2016 12:59:15 Joanne Michael ([id number]) -------------------------------------------------------------------------------- HPI Details Patient Name: Joanne Michael Date of Service: 08/20/2016 12:30 PM Medical Record Number: [id number] Patient Account Number: [id number] Date of Birth/Sex: [birth year] (83 y.o. Female) Treating RN: Montey Hora Primary Care Provider: Dion Body Other Clinician: Referring Provider: Dion Body Treating Provider/Extender: Melburn Hake, Joanne Weeks in Treatment: 3 History of Present Illness Location: Patient presents with an ulcer on the left lower extremity in the anterior lateral calf area Quality: Patient reports experiencing a throbbing pain to affected area intermittently Severity: Patient states wound are getting better Duration: Patient has had the wound for > 2 months prior to seeking treatment at the wound center Timing: Pain in wound is Intermittent (comes and goes Context: The wound occurred when the patient had a blunt trauma to the legs and had a large swelling Modifying Factors: Other treatment(s) tried include:local care with antibiotic ointment Associated Signs and Symptoms: Patient reports having increase swelling. HPI Description: 83 year old patient referred to [place] by Ardyth Man, PA, for a ulcer on the left lower extremity, with cellulitis caused by trauma 2 months ago. past medical history of atrial fibrillation, chronic renal failure stage II, essential hypertension, and deficiency anemia, osteoporosis and spinal stenosis. She is status post appendectomy,  gallbladder surgery, hernia repair, hysterectomy, knee replacement. She was recently placed on doxycycline. 08/06/2016 -- she has not been using her lymphedema pumps and I have urged her to do this. She will be going off to the beach for a week and will see as the week after 08/20/16 on evaluation today patient's wound of the left lower extremity appears to be doing very well. She is been tolerating the dressing changes without complication and she is pleased with how this is progressing. There is  no evidence of infection. Electronic Signature(s) Signed: 08/20/2016 5:09:57 PM By: Worthy Keeler PA-C Entered By: Worthy Keeler on 08/20/2016 13:02:48 Joanne Michael ([id number]) -------------------------------------------------------------------------------- Physical Exam Details Patient Name: Joanne Michael Date of Service: 08/20/2016 12:30 PM Medical Record Number: [id number] Patient Account Number: [id number] Date of Birth/Sex: 07-12-1924 (83 y.o. Female) Treating RN: Montey Hora Primary Care Provider: Dion Body Other Clinician: Referring Provider: Dion Body Treating Provider/Extender: Melburn Hake, Joanne Weeks in Treatment: 3 Constitutional Well-nourished and well-hydrated in no acute distress. Respiratory normal breathing without difficulty. Psychiatric this patient is able to make decisions and demonstrates good insight into disease process. Alert and Oriented x 3. pleasant and cooperative. Notes Patient wound does appear to be slough covered and will required debridement today. Fortunately she tolerated the debridement without complication today and the wound bed was greatly improved. Electronic Signature(s) Signed: 08/20/2016 5:09:57 PM By: Worthy Keeler PA-C Entered By: Worthy Keeler on 08/20/2016 13:03:34 Joanne Michael ([id number]) -------------------------------------------------------------------------------- Physician Orders Details Patient  Name: Joanne Michael Date of Service: 08/20/2016 12:30 PM Medical Record Number: [id number] Patient Account Number: [id number] Date of Birth/Sex: 12/26/1924 (83 y.o. Female) Treating RN: Montey Hora Primary Care Provider: Dion Body Other Clinician: Referring Provider: Dion Body Treating Provider/Extender: Melburn Hake, Joanne Weeks in Treatment: 3 Verbal / Phone Orders: No Diagnosis Coding ICD-10 Coding Code Description I89.0 Lymphedema, not elsewhere classified L97.222 Non-pressure chronic ulcer of left calf with fat layer exposed Wound Cleansing Wound #1 Left,Lateral Lower Leg o Clean wound with Normal Saline. o Cleanse wound with mild soap and water Anesthetic Wound #1 Left,Lateral Lower Leg o Topical Lidocaine 4% cream applied to wound bed prior to debridement Primary Wound Dressing Wound #1 Left,Lateral Lower Leg o Santyl Ointment Secondary Dressing Wound #1 Left,Lateral Lower Leg o Gauze, ABD and Kerlix/Conform Dressing Change Frequency Wound #1 Left,Lateral Lower Leg o Change dressing every day. Follow-up Appointments Wound #1 Left,Lateral Lower Leg o Return Appointment in 1 week. - on Monday Edema Control Wound #1 Left,Lateral Lower Leg o Elevate legs to the level of the heart and pump ankles as often as possible o Compression Pump: Use compression pump on left lower extremity for 30 minutes, twice daily. Joanne Michael, Joanne Michael ([id number]) o Compression Pump: Use compression pump on right lower extremity for 30 minutes, twice daily. Additional Orders / Instructions Wound #1 Left,Lateral Lower Leg o Increase protein intake. o Other: - Please add vitamin A, vitamin C and zinc supplements to your diet Medications-please add to medication list. Wound #1 Left,Lateral Lower Leg o Santyl Enzymatic Ointment Notes We are going to continue with the Current wound care measures including Santyl for the next week. She will continue  as well to use her lymphedema pumps at home. We will see were things stand in one week. If anything worsens in the interim she will contact our office for additional recommendations. Electronic Signature(s) Signed: 08/20/2016 5:09:57 PM By: Worthy Keeler PA-C Entered By: Worthy Keeler on 08/20/2016 13:04:21 Joanne Michael ([id number]) -------------------------------------------------------------------------------- Problem List Details Patient Name: Joanne Michael Date of Service: 08/20/2016 12:30 PM Medical Record Number: [id number] Patient Account Number: [id number] Date of Birth/Sex: 1924-04-23 (83 y.o. Female) Treating RN: Montey Hora Primary Care Provider: Dion Body Other Clinician: Referring Provider: Dion Body Treating Provider/Extender: Melburn Hake, Joanne Weeks in Treatment: 3 Active Problems ICD-10 Encounter Code Description Active Date Diagnosis I89.0 Lymphedema, not elsewhere classified 07/26/2016 Yes L97.222 Non-pressure chronic ulcer of left calf with fat layer 07/26/2016 Yes  exposed Inactive Problems Resolved Problems Electronic Signature(s) Signed: 08/20/2016 5:09:57 PM By: Worthy Keeler PA-C Entered By: Worthy Keeler on 08/20/2016 12:50:59 Joanne Michael ([id number]) -------------------------------------------------------------------------------- Progress Note Details Patient Name: Joanne Michael Date of Service: 08/20/2016 12:30 PM Medical Record Number: [id number] Patient Account Number: [id number] Date of Birth/Sex: 02/21/1925 (83 y.o. Female) Treating RN: Montey Hora Primary Care Provider: Dion Body Other Clinician: Referring Provider: Dion Body Treating Provider/Extender: Melburn Hake, Joanne Weeks in Treatment: 3 Subjective Chief Complaint Information obtained from Patient Patient presents to the wound care center due with non-wound condition(s) the left lower extremity History of Present Illness  (HPI) The following HPI elements were documented for the patient's wound: Location: Patient presents with an ulcer on the left lower extremity in the anterior lateral calf area Quality: Patient reports experiencing a throbbing pain to affected area intermittently Severity: Patient states wound are getting better Duration: Patient has had the wound for > 2 months prior to seeking treatment at the wound center Timing: Pain in wound is Intermittent (comes and goes Context: The wound occurred when the patient had a blunt trauma to the legs and had a large swelling Modifying Factors: Other treatment(s) tried include:local care with antibiotic ointment Associated Signs and Symptoms: Patient reports having increase swelling. 83 year old patient referred to [place] by Ardyth Man, PA, for a ulcer on the left lower extremity, with cellulitis caused by trauma 2 months ago. past medical history of atrial fibrillation, chronic renal failure stage II, essential hypertension, and deficiency anemia, osteoporosis and spinal stenosis. She is status post appendectomy, gallbladder surgery, hernia repair, hysterectomy, knee replacement. She was recently placed on doxycycline. 08/06/2016 -- she has not been using her lymphedema pumps and I have urged her to do this. She will be going off to the beach for a week and will see as the week after 08/20/16 on evaluation today patient's wound of the left lower extremity appears to be doing very well. She is been tolerating the dressing changes without complication and she is pleased with how this is progressing. There is no evidence of infection. Objective Joanne Michael, Joanne Michael ([id number]) Constitutional Well-nourished and well-hydrated in no acute distress. Vitals Time Taken: 12:38 PM, Height: 60 in, Weight: 116 lbs, BMI: 22.7, Temperature: 98.1 F, Pulse: 61 bpm, Respiratory Rate: 18 breaths/min, Blood Pressure: 145/84 mmHg. Respiratory normal breathing without  difficulty. Psychiatric this patient is able to make decisions and demonstrates good insight into disease process. Alert and Oriented x 3. pleasant and cooperative. General Notes: Patient wound does appear to be slough covered and will required debridement today. Fortunately she tolerated the debridement without complication today and the wound bed was greatly improved. Integumentary (Hair, Skin) Wound #1 status is Open. Original cause of wound was Trauma. The wound is located on the Left,Lateral Lower Leg. The wound measures 1.9cm length x 2.7cm width x 0.1cm depth; 4.029cm^2 area and 0.403cm^3 volume. There is no tunneling or undermining noted. There is a large amount of serous drainage noted. The wound margin is flat and intact. There is medium (34-66%) red granulation within the wound bed. There is a medium (34-66%) amount of necrotic tissue within the wound bed including Eschar and Adherent Slough. The periwound skin appearance exhibited: Erythema. The periwound skin appearance did not exhibit: Callus, Crepitus, Excoriation, Induration, Rash, Scarring, Dry/Scaly, Maceration, Atrophie Blanche, Cyanosis, Ecchymosis, Hemosiderin Staining, Mottled, Pallor, Rubor. The surrounding wound skin color is noted with erythema which is circumferential. Periwound temperature was noted as No Abnormality. The periwound  has tenderness on palpation. Assessment Active Problems ICD-10 I89.0 - Lymphedema, not elsewhere classified L97.222 - Non-pressure chronic ulcer of left calf with fat layer exposed Procedures Joanne Michael, Joanne Michael ([id number]) Wound #1 Pre-procedure diagnosis of Wound #1 is a Trauma, Other located on the Left,Lateral Lower Leg . There was a Skin/Subcutaneous Tissue Debridement ([id number]) debridement with total area of 5.13 sq cm performed by STONE III, Joanne E., PA-C. with the following instrument(s): Curette to remove Viable and Non-Viable tissue/material including Fibrin/Slough and  Subcutaneous after achieving pain control using Lidocaine 4% Topical Solution. A time out was conducted at 12:54, prior to the start of the procedure. A Minimum amount of bleeding was controlled with Pressure. The procedure was tolerated well with a pain level of 0 throughout and a pain level of 0 following the procedure. Post Debridement Measurements: 1.9cm length x 2.7cm width x 0.2cm depth; 0.806cm^3 volume. Character of Wound/Ulcer Post Debridement is improved. Post procedure Diagnosis Wound #1: Same as Pre-Procedure Plan Wound Cleansing: Wound #1 Left,Lateral Lower Leg: Clean wound with Normal Saline. Cleanse wound with mild soap and water Anesthetic: Wound #1 Left,Lateral Lower Leg: Topical Lidocaine 4% cream applied to wound bed prior to debridement Primary Wound Dressing: Wound #1 Left,Lateral Lower Leg: Santyl Ointment Secondary Dressing: Wound #1 Left,Lateral Lower Leg: Gauze, ABD and Kerlix/Conform Dressing Change Frequency: Wound #1 Left,Lateral Lower Leg: Change dressing every day. Follow-up Appointments: Wound #1 Left,Lateral Lower Leg: Return Appointment in 1 week. - on Monday Edema Control: Wound #1 Left,Lateral Lower Leg: Elevate legs to the level of the heart and pump ankles as often as possible Compression Pump: Use compression pump on left lower extremity for 30 minutes, twice daily. Compression Pump: Use compression pump on right lower extremity for 30 minutes, twice daily. Additional Orders / Instructions: Wound #1 Left,Lateral Lower Leg: Increase protein intake. Other: - Please add vitamin A, vitamin C and zinc supplements to your diet Medications-please add to medication list.: Wound #1 Left,Lateral Lower Leg: Joanne Michael, Joanne Michael ([id number]) Santyl Enzymatic Ointment General Notes: We are going to continue with the Current wound care measures including Santyl for the next week. She will continue as well to use her lymphedema pumps at home. We will see  were things stand in one week. If anything worsens in the interim she will contact our office for additional recommendations. Electronic Signature(s) Signed: 08/20/2016 5:09:57 PM By: Worthy Keeler PA-C Entered By: Worthy Keeler on 08/20/2016 13:04:29 Joanne Michael ([id number]) -------------------------------------------------------------------------------- SuperBill Details Patient Name: Joanne Michael Date of Service: 08/20/2016 Medical Record Number: [id number] Patient Account Number: [id number] Date of Birth/Sex: [birth year] (83 y.o. Female) Treating RN: Montey Hora Primary Care Provider: Dion Body Other Clinician: Referring Provider: Dion Body Treating Provider/Extender: Melburn Hake, Joanne Weeks in Treatment: 3 Diagnosis Coding ICD-10 Codes Code Description I89.0 Lymphedema, not elsewhere classified L97.222 Non-pressure chronic ulcer of left calf with fat layer exposed Facility Procedures CPT4 Code: 82800349 Description: [facility] TISSUE 20 SQ CM/< ICD-10 Description Diagnosis L97.222 Non-pressure chronic ulcer of left calf with fat l Modifier: ayer exposed Quantity: 1 Physician Procedures CPT4 Code: [id number] Description: 69794 - WC PHYS SUBQ TISS 20 SQ CM ICD-10 Description Diagnosis L97.222 Non-pressure chronic ulcer of left calf with fat l Modifier: ayer exposed Quantity: 1 Electronic Signature(s) Signed: 08/20/2016 5:09:57 PM By: Worthy Keeler PA-C Entered By: Worthy Keeler on 08/20/2016 13:04:43

## 2016-08-21 NOTE — ED Notes (Addendum)
Date and time results received: 08/21/16 147 (use smartphrase ".now" to insert current time)  Test: troponin Critical Value: 0.03  Name of Provider Notified: [email or web address]

## 2016-08-21 NOTE — ED Notes (Signed)
First Nurse: pt presents with shortness of breath and chest pain.

## 2016-08-21 NOTE — ED Notes (Signed)
Pt taken to [facility] at this time

## 2016-08-21 NOTE — ED Notes (Signed)
Pt returned from [facility]

## 2016-08-21 NOTE — H&P (Addendum)
[facility] NAME: Joanne Michael    MR#:  [id number]  DATE OF BIRTH:  24-Jul-1924  DATE OF ADMISSION:  08/21/2016  PRIMARY CARE PHYSICIAN: Dion Body, MD   REQUESTING/REFERRING PHYSICIAN: Dr. Mable Paris  CHIEF COMPLAINT:   Chief Complaint  Patient presents with  . Chest Pain    HISTORY OF PRESENT ILLNESS:  Joanne Michael  is a 83 y.o. female with a known history of Hypertension, hyperlipidemia, history of chronic kidney disease stage III, osteoarthritis, history of renal artery stenosis status post stent, spinal stenosis who presents to the hospital due to chest pain. Patient says she developed chest pain late last night which kept her up all night. It felt like an ache in the center of her chest, and got worse when she took a deep breath. She had some associated shortness of breath with it but no nausea vomiting palpitations syncope. A few days ago patient did complain of some cramping in her lower extremities in her calves but her Dopplers of lower extremities negative for DVT. She was noted to have a mildly elevated troponin and continues to have intermittent chest pain and therefore hospitalist services were contacted further treatment and evaluation. Patient denies any other associated symptoms presently.  PAST MEDICAL HISTORY:   Past Medical History:  Diagnosis Date  . Arthritis   . Cancer ([place])    melanoma  . Cat allergies   . Chronic renal failure   . Fatigue   . GERD (gastroesophageal reflux disease)   . Gout   . H/O Clostridium difficile infection   . Hay fever    as child  . Hiatal hernia   . HLD (hyperlipidemia)   . HTN (hypertension)   . Iron deficiency anemia   . Spinal stenosis     PAST SURGICAL HISTORY:   Past Surgical History:  Procedure Laterality Date  . APPENDECTOMY    . BACK SURGERY    . CATARACT EXTRACTION    . CHOLECYSTECTOMY    . feet surgery    . HERNIA REPAIR    . knee replacement-  both    . MELANOMA EXCISION    . NOSE SURGERY    . RENAL ANGIOGRAPHY N/A 05/07/2016   Procedure: Renal Angiography;  Surgeon: Algernon Huxley, MD;  Location: [facility];  Service: Cardiovascular;  Laterality: N/A;  . ROTATOR CUFF REPAIR     left  . SHOULDER SURGERY    . TONSILLECTOMY    . TOTAL ABDOMINAL HYSTERECTOMY      SOCIAL HISTORY:   Social History  Substance Use Topics  . Smoking status: Never Smoker  . Smokeless tobacco: Never Used     Comment: Tobacco use- no   . Alcohol use 1.8 oz/week    3 Glasses of wine per week    FAMILY HISTORY:   Family History  Problem Relation Age of Onset  . Ovarian cancer Mother   . Liver cancer Father   . Heart failure Father   . Lung cancer Brother   . Prostate cancer Brother   . Lung cancer Sister     DRUG ALLERGIES:   Allergies  Allergen Reactions  . Oxycodone Itching  . Codeine Nausea And Vomiting and Itching  . Hydrocodone-Acetaminophen Other (See Comments)    Hallucinates  . Iodine Swelling, Other (See Comments), Itching and Nausea Only    Patient is allergic to seafood and this is the cause why she can't take medication.  Marland Kitchen  Meperidine Hcl     Unknown reactions.  . Other Swelling    Seafood- tongue swelling and discoloration Patient allergic to seafood, tongue swelling and discoloration.  . Propoxyphene N-Acetaminophen Other (See Comments)    Unknown reaction    REVIEW OF SYSTEMS:   Review of Systems  Constitutional: Negative for fever and weight loss.  HENT: Negative for congestion, nosebleeds and tinnitus.   Eyes: Negative for blurred vision, double vision and redness.  Respiratory: Positive for shortness of breath. Negative for cough and hemoptysis.   Cardiovascular: Positive for chest pain. Negative for orthopnea, leg swelling and PND.  Gastrointestinal: Negative for abdominal pain, diarrhea, melena, nausea and vomiting.  Genitourinary: Negative for dysuria, hematuria and urgency.  Musculoskeletal:  Negative for falls and joint pain.  Neurological: Negative for dizziness, tingling, sensory change, focal weakness, seizures, weakness and headaches.  Endo/Heme/Allergies: Negative for polydipsia. Does not bruise/bleed easily.  Psychiatric/Behavioral: Negative for depression and memory loss. The patient is not nervous/anxious.     MEDICATIONS AT HOME:   Prior to Admission medications   Medication Sig Start Date End Date Taking? Authorizing Provider  albuterol (PROVENTIL HFA;VENTOLIN HFA) 108 (90 BASE) MCG/ACT inhaler Inhale 1-2 puffs into the lungs every 6 (six) hours as needed for wheezing or shortness of breath. 02/14/15  Yes Minna Merritts, MD  allopurinol (ZYLOPRIM) 100 MG tablet Take 100 mg by mouth 2 (two) times daily.   Yes [provider]  atorvastatin (LIPITOR) 20 MG tablet Take 20 mg by mouth at bedtime.    Yes [provider]  Calcium Carbonate-Vitamin D (CALTRATE 600+D) 600-400 MG-UNIT per tablet Take 1 tablet by mouth daily.     Yes [provider]  carvedilol (COREG) 3.125 MG tablet Take 3.125 mg by mouth 2 (two) times daily. 04/06/16 04/06/17 Yes [provider]  cetirizine (ZYRTEC) 10 MG chewable tablet Chew 10 mg by mouth daily.     Yes [provider]  cloNIDine (CATAPRES) 0.2 MG tablet Take 0.2 mg by mouth 3 (three) times daily.    Yes [provider]  clopidogrel (PLAVIX) 75 MG tablet Take 1 tablet (75 mg total) by mouth daily. 05/10/16  Yes Fritzi Mandes, MD  collagenase (SANTYL) ointment Apply 1 application topically daily.   Yes [provider]  ferrous sulfate 325 (65 FE) MG tablet Take 325 mg by mouth daily with breakfast.   Yes [provider]  fluticasone (FLONASE) 50 MCG/ACT nasal spray Place 2 sprays into both nostrils at bedtime as needed for allergies or rhinitis. 05/25/15  Yes Sudini, Alveta Heimlich, MD  furosemide (LASIX) 20 MG tablet Take 20 mg by mouth See admin instructions. Takes 1 tab on Monday, Wed,  and Friday    Yes [provider]  hydrALAZINE (APRESOLINE) 25 MG tablet Take 1 tablet (25 mg total) by mouth every 8 (eight) hours. Patient taking differently: Take 25 mg by mouth 2 (two) times daily.  05/09/16  Yes Fritzi Mandes, MD  Multiple Vitamin (MULTIVITAMIN) tablet Take 1 tablet by mouth daily.   Yes [provider]  pantoprazole (PROTONIX) 40 MG tablet Take 40 mg by mouth at bedtime.  02/14/15  Yes Minna Merritts, MD  Probiotic Product ([place]) Take 1 capsule by mouth daily.   Yes [provider]  docusate sodium (COLACE) 100 MG capsule Take 1 capsule (100 mg total) by mouth daily as needed. Patient not taking: Reported on 08/21/2016 05/03/16 05/03/17  Darel Hong, MD  VITAL SIGNS:  Blood pressure (!) 137/41, pulse 64, resp. rate (!) 21, height 4\' 11"  (1.499 m), weight 51.7 kg (114 lb), SpO2 98 %.  PHYSICAL EXAMINATION:  Physical Exam  GENERAL:  83 y.o.-year-old patient lying in the bed in no acute distress.  EYES: Pupils equal, round, reactive to light and accommodation. No scleral icterus. Extraocular muscles intact.  HEENT: Head atraumatic, normocephalic. Oropharynx and nasopharynx clear. No oropharyngeal erythema, moist oral mucosa  NECK:  Supple, no jugular venous distention. No thyroid enlargement, no tenderness.  LUNGS: Normal breath sounds bilaterally, no wheezing, rales, rhonchi. No use of accessory muscles of respiration.  CARDIOVASCULAR: S1, S2 II/VI SEM at RSB. No murmurs, rubs, gallops, clicks.  ABDOMEN: Soft, nontender, nondistended. Bowel sounds present. No organomegaly or mass.  EXTREMITIES: No pedal edema, cyanosis, or clubbing. + 2 pedal & radial pulses b/l.   NEUROLOGIC: Cranial nerves II through XII are intact. No focal Motor or sensory deficits appreciated b/l PSYCHIATRIC: The patient is alert and oriented x 3. Good affect.  SKIN: No obvious rash, lesion, or ulcer.   LABORATORY PANEL:   CBC  Recent  Labs Lab 08/21/16 1325  WBC 13.1*  HGB 10.6*  HCT 30.5*  PLT 254   ------------------------------------------------------------------------------------------------------------------  Chemistries   Recent Labs Lab 08/21/16 1325  NA 127*  K 4.0  CL 97*  CO2 21*  GLUCOSE 102*  BUN 32*  CREATININE 1.77*  CALCIUM 9.1   ------------------------------------------------------------------------------------------------------------------  Cardiac Enzymes  Recent Labs Lab 08/21/16 1325  TROPONINI 0.03*   ------------------------------------------------------------------------------------------------------------------  RADIOLOGY:  Dg Chest 2 View  Result Date: 08/21/2016 CLINICAL DATA:  Mid chest pain with shortness of breath which is got worse over the past week. Nonsmoker, history of hypertension, aortic stenosis, primary pulmonary hypertension, history of mycobacterial lung infection. EXAM: CHEST  2 VIEW COMPARISON:  Chest x-ray of May 05, 2016 FINDINGS: The lungs are mildly hyperinflated with hemidiaphragm flattening. There is persistent increased density at the left lung base with obscuration of the hemidiaphragm. The lung markings are coarse in the right infrahilar region but also stable. The heart and pulmonary vascularity are normal. There is calcification in the wall of the thoracic aorta. The bony thorax exhibits no acute abnormality. IMPRESSION: Chronic bronchitic changes, stable. No CHF. Persistent bibasilar densities greatest on the left which may reflect scarring but superimposed pneumonia is not excluded. Followup PA and lateral chest X-ray is recommended in 3-4 weeks following trial of antibiotic therapy to ensure resolution and exclude underlying malignancy. Thoracic aortic atherosclerosis. Electronically Signed   By: David  Martinique M.D.   On: 08/21/2016 14:17   US Venous Img Lower Bilateral  Result Date: 08/21/2016 CLINICAL DATA:  83 year old female with leg swelling  for years. Initial encounter. EXAM: BILATERAL LOWER EXTREMITY VENOUS DOPPLER ULTRASOUND TECHNIQUE: Gray-scale sonography with graded compression, as well as color Doppler and duplex ultrasound were performed to evaluate the lower extremity deep venous systems from the level of the common femoral vein and including the common femoral, femoral, profunda femoral, popliteal and calf veins including the posterior tibial, peroneal and gastrocnemius veins when visible. Spectral Doppler was utilized to evaluate flow at rest and with distal augmentation maneuvers in the common femoral, femoral and popliteal veins. COMPARISON:  None. FINDINGS: RIGHT LOWER EXTREMITY Common Femoral Vein: No evidence of thrombus. Normal compressibility, respiratory phasicity and response to augmentation. Saphenofemoral Junction: No evidence of thrombus. Normal compressibility and flow on color Doppler imaging. Profunda Femoral Vein: No evidence of thrombus. Normal compressibility and flow on  color Doppler imaging. Femoral Vein: No evidence of thrombus. Normal compressibility, respiratory phasicity and response to augmentation. Popliteal Vein: No evidence of thrombus. Normal compressibility, respiratory phasicity and response to augmentation. Calf Veins: No evidence of thrombus. Normal compressibility and flow on color Doppler imaging. Other Findings:  None. LEFT LOWER EXTREMITY Common Femoral Vein: No evidence of thrombus. Normal compressibility, respiratory phasicity and response to augmentation. Saphenofemoral Junction: No evidence of thrombus. Normal compressibility and flow on color Doppler imaging. Profunda Femoral Vein: No evidence of thrombus. Normal compressibility and flow on color Doppler imaging. Femoral Vein: No evidence of thrombus. Normal compressibility, respiratory phasicity and response to augmentation. Popliteal Vein: No evidence of thrombus. Normal compressibility, respiratory phasicity and response to augmentation. Calf  Veins: No evidence of thrombus. Normal compressibility and flow on color Doppler imaging. Other Findings:  None. IMPRESSION: No evidence of DVT within either lower extremity. Evaluation of left peroneal vein limited by edema and bandage. Electronically Signed   By: Genia Del M.D.   On: 08/21/2016 17:18     IMPRESSION AND PLAN:   83 year old female with past medical history of hypertension, hyperlipidemia, spinal stenosis, history of gout, GERD, chronic kidney disease stage III, history of renal artery stenosis status post renal stent presents to the hospital due to chest pain.   1. Chest pain-patient does have risk factors given her hypertension, chronic kidney disease and vascular disease. -Her chest pain has more of a pleuritic component for it. Cannot do a CT chest to rule out pulmonary embolism given her chronic kidney disease. Dopplers of her lower extremity are negative for DVT. -We'll observe on telemetry, cycle her cardiac markers. I will get a VQ scan. ECG showing no acute ST or T wave changes.  -Continue aspirin, Plavix, nitroglycerin, statin.  2. History of peripheral vascular disease-continue Plavix, statin.  3. Essential hypertension-continue carvedilol, hydralazine.  4. History of gout-no acute attack. Continue allopurinol.  5. GERD-continue Protonix    All the records are reviewed and case discussed with ED provider. Management plans discussed with the patient, family and they are in agreement.  CODE STATUS: Full code  TOTAL TIME TAKING CARE OF THIS PATIENT: 45 minutes.    Henreitta Leber M.D on 08/21/2016 at 6:53 PM  Between 7am to 6pm - Pager - [phone]  After 6pm go to [email or web address] - password [email or web address] [facility]  Office  [phone]  CC: Primary care physician; Dion Body, MD

## 2016-08-21 NOTE — ED Provider Notes (Signed)
[facility] Emergency Department Provider Note  ____________________________________________   First MD Initiated Contact with Patient 08/21/16 1438     (approximate)  I have reviewed the triage vital signs and the nursing notes.   HISTORY  Chief Complaint Chest Pain  History is somewhat challenging secondary to the patient's dementia  HPI ALIECE HONOLD is a 83 y.o. female who comes to the emergency department with her family for chest pain and shortness of breath that began yesterday. According to the patient's daughter at bedside the patient first reported cramping in her left lower extremity this morning and then awoke with sharp pleuritic left-sided chest pain and shortness of breath. She has a past medical history of hypertension, CKD, and a recent admission for community-acquired pneumonia and renal artery stenosis.She's had recent left lower extremity swelling secondary to an injury. She was recently admitted to our hospital for community-acquired pneumonia and at that time was found to have renal artery stenosis which was stented. Her chest pain at this time is somewhat exertional and somewhat improved with rest.     Past Medical History:  Diagnosis Date  . Arthritis   . Cancer ([place])    melanoma  . Cat allergies   . Chronic renal failure   . Fatigue   . GERD (gastroesophageal reflux disease)   . Gout   . H/O Clostridium difficile infection   . Hay fever    as child  . Hiatal hernia   . HLD (hyperlipidemia)   . HTN (hypertension)   . Iron deficiency anemia   . Spinal stenosis     Patient Active Problem List   Diagnosis Date Noted  . Renal artery stenosis ([place]) 05/25/2016  . Community acquired pneumonia 05/05/2016  . Iron deficiency anemia 11/21/2015  . Gout, joint 10/10/2015  . Pure hypercholesterolemia 10/10/2015  . Chronic hip pain (Bilateral) 07/13/2015  . Posterior arthritis of hip (Bilateral) 07/13/2015  . Chronic  sacroiliac joint pain (Bilateral) 07/13/2015  . Osteoarthritis of sacroiliac joint (Bilateral) 07/13/2015  . Lumbar spinal stenosis 07/13/2015  . Acute renal failure (ARF) ([place]) 05/22/2015  . Kidney failure 04/26/2015  . Spinal stenosis 04/18/2015  . Osteoporosis, post-menopausal 04/18/2015  . Arthritis, degenerative 04/18/2015  . 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 Combined fat and carbohydrate induced hyperlipemia 04/18/2015  . Anemia, iron deficiency 04/18/2015  . Arthritis urica 04/18/2015  . Gastro-esophageal reflux disease without esophagitis 04/18/2015  . Essential (primary) hypertension 04/18/2015  . CKD (chronic kidney disease) stage 4, GFR 15-29 ml/min (HCC) 04/18/2015  . Chronic low back pain (Location of Primary Source of Pain) (Bilateral) (R>L) 04/18/2015  . Lumbar facet syndrome (Location of Primary Source of Pain) (Bilateral) (R>L) 04/18/2015  . Lumbar spondylosis 04/18/2015  . Chronic pain 04/18/2015  . Long term current use of opiate analgesic 04/18/2015  . Long term prescription opiate use 04/18/2015  . Opiate use 04/18/2015  . Encounter for therapeutic drug level monitoring 04/18/2015  . Encounter for chronic pain management 04/18/2015  . Chronic lower extremity pain (referred) (Location of Secondary source of pain) (Bilateral) (R>L) 04/18/2015  . Neurogenic pain 04/18/2015  . Heart murmur, systolic 77/12/6577  . Pulmonary parenchymal mass 12/02/2013  . Lung mass 12/02/2013  . SOB (shortness of breath) 11/03/2013  . Cough 11/03/2013  . Bilateral leg edema 07/22/2013  . Chronic renal insufficiency 07/22/2013  . Aortic valve stenosis 07/22/2013  . MURMUR 05/26/2009  . Hyperlipidemia 05/02/2009  . ESSENTIAL HYPERTENSION, BENIGN 05/02/2009  .  SHORTNESS OF BREATH 05/02/2009  . CHEST PAIN UNSPECIFIED 05/02/2009    Past Surgical History:  Procedure Laterality Date  . APPENDECTOMY    . BACK SURGERY    . CATARACT EXTRACTION    .  CHOLECYSTECTOMY    . feet surgery    . HERNIA REPAIR    . knee replacement- both    . MELANOMA EXCISION    . NOSE SURGERY    . RENAL ANGIOGRAPHY N/A 05/07/2016   Procedure: Renal Angiography;  Surgeon: Algernon Huxley, MD;  Location: [place] [facility];  Service: Cardiovascular;  Laterality: N/A;  . ROTATOR CUFF REPAIR     left  . SHOULDER SURGERY    . TONSILLECTOMY    . TOTAL ABDOMINAL HYSTERECTOMY      Prior to Admission medications   Medication Sig Start Date End Date Taking? Authorizing Provider  albuterol (PROVENTIL HFA;VENTOLIN HFA) 108 (90 BASE) MCG/ACT inhaler Inhale 1-2 puffs into the lungs every 6 (six) hours as needed for wheezing or shortness of breath. 02/14/15   Minna Merritts, MD  allopurinol (ZYLOPRIM) 100 MG tablet Take 100 mg by mouth 2 (two) times daily.    [provider]  aspirin EC 81 MG tablet Take 81 mg by mouth daily.    [provider]  atorvastatin (LIPITOR) 20 MG tablet Take 20 mg by mouth at bedtime.     [provider]  Calcium Carbonate-Vitamin D (CALTRATE 600+D) 600-400 MG-UNIT per tablet Take 1 tablet by mouth daily.      [provider]  carvedilol (COREG) 3.125 MG tablet Take 3.125 mg by mouth 2 (two) times daily. 04/06/16 04/06/17  [provider]  cetirizine (ZYRTEC) 10 MG chewable tablet Chew 10 mg by mouth daily.      [provider]  cloNIDine (CATAPRES) 0.2 MG tablet Take 0.2 mg by mouth 3 (three) times daily.     [provider]  clopidogrel (PLAVIX) 75 MG tablet Take 1 tablet (75 mg total) by mouth daily. 05/10/16   Fritzi Mandes, MD  docusate sodium (COLACE) 100 MG capsule Take 1 capsule (100 mg total) by mouth daily as needed. 05/03/16 05/03/17  Darel Hong, MD  ferrous sulfate 325 (65 FE) MG tablet Take 325 mg by mouth daily with breakfast.    [provider]  fluticasone (FLONASE) 50 MCG/ACT nasal spray Place 2 sprays into both nostrils at bedtime as needed for allergies or  rhinitis. 05/25/15   Hillary Bow, MD  furosemide (LASIX) 20 MG tablet Take 20 mg by mouth every other day. Takes 1 tab on Monday, Wed, and Friday    [provider]  hydrALAZINE (APRESOLINE) 25 MG tablet Take 1 tablet (25 mg total) by mouth every 8 (eight) hours. 05/09/16   Fritzi Mandes, MD  Multiple Vitamin (MULTIVITAMIN) tablet Take 1 tablet by mouth daily.    [provider]  pantoprazole (PROTONIX) 40 MG tablet Take 40 mg by mouth at bedtime.  02/14/15   Minna Merritts, MD  Probiotic Product ([place]) Take 1 capsule by mouth daily.    [provider]    Allergies Oxycodone; Codeine; Hydrocodone-acetaminophen; Iodine; Meperidine hcl; Other; and Propoxyphene n-acetaminophen  Family History  Problem Relation Age of Onset  . Ovarian cancer Mother   . Liver cancer Father   . Heart failure Father   . Lung cancer Brother   . Prostate cancer Brother   . Lung cancer Sister     Social History Social  History  Substance Use Topics  . Smoking status: Never Smoker  . Smokeless tobacco: Never Used     Comment: Tobacco use- no   . Alcohol use 1.8 oz/week    3 Glasses of wine per week    Review of Systems Constitutional: No fever/chills Eyes: No visual changes. ENT: No sore throat. Cardiovascular: Positive chest pain. Respiratory: Positive shortness of breath. Gastrointestinal: No abdominal pain.  No nausea, no vomiting.  No diarrhea.  No constipation. Genitourinary: Negative for dysuria. Musculoskeletal: Negative for back pain. Skin: Negative for rash. Neurological: Negative for headaches, focal weakness or numbness.   ____________________________________________   PHYSICAL EXAM:  VITAL SIGNS: ED Triage Vitals  Enc Vitals Group     BP 08/21/16 1325 (!) 149/49     Pulse Rate 08/21/16 1323 87     Resp 08/21/16 1323 (!) 28     Temp --      Temp src --      SpO2 08/21/16 1323 97 %     Weight 08/21/16 1324 114 lb (51.7 kg)      Height 08/21/16 1324 4\' 11"  (1.499 m)     Head Circumference --      Peak Flow --      Pain Score 08/21/16 1322 7     Pain Loc --      Pain Edu? --      Excl. in [place]? --     Constitutional: Alert and oriented 4 appears somewhat short of breath with elevated respiratory rate Eyes: PERRL EOMI. Head: Atraumatic. Nose: No congestion/rhinnorhea. Mouth/Throat: No trismus Neck: No stridor.   Cardiovascular: Normal rate, regular rhythm. Grossly normal heart sounds.  Good peripheral circulation. Able to lie completely flat with no jugular venous distention Respiratory: Increased respiratory effort.  No retractions. Lungs CTAB and moving good air Gastrointestinal: Soft nontender Musculoskeletal: Left lower extremity 1+ edema Neurologic:  Normal speech and language. No gross focal neurologic deficits are appreciated. Skin:  Skin is warm, dry and intact. No rash noted. Psychiatric: Mood and affect are normal. Speech and behavior are normal.    ____________________________________________   DIFFERENTIAL includes but not limited to  Pulmonary embolus, acute coronary syndrome, and the thorax, pneumonia, Boerhaave syndrome, metabolic derangement   LABS (all labs ordered are listed, but only abnormal results are displayed)  Labs Reviewed  BASIC METABOLIC PANEL - Abnormal; Notable for the following:       Result Value   Sodium 127 (*)    Chloride 97 (*)    CO2 21 (*)    Glucose, Bld 102 (*)    BUN 32 (*)    Creatinine, Ser 1.77 (*)    GFR calc non Af Amer 24 (*)    GFR calc Af Amer 28 (*)    All other components within normal limits  CBC - Abnormal; Notable for the following:    WBC 13.1 (*)    RBC 3.32 (*)    Hemoglobin 10.6 (*)    HCT 30.5 (*)    All other components within normal limits  TROPONIN I - Abnormal; Notable for the following:    Troponin I 0.03 (*)    All other components within normal limits  Elevated troponin but in the setting of low GFR unclear if this truly  represents myocardial ischemia versus demand __________________________________________  EKG  ED ECG REPORT I, Darel Hong, the attending physician, personally viewed and interpreted this ECG.  Date: 08/21/2016 EKG Time: 1320 Rate: 85 Rhythm: normal sinus rhythm  QRS Axis: normal Intervals: normal ST/T Wave abnormalities: T-wave inversion in leads V1 and V2 is new when compared to March of this year Narrative Interpretation: unremarkable  ____________________________________________  RADIOLOGY  Dg Chest 2 View  Result Date: 08/21/2016 CLINICAL DATA:  Mid chest pain with shortness of breath which is got worse over the past week. Nonsmoker, history of hypertension, aortic stenosis, primary pulmonary hypertension, history of mycobacterial lung infection. EXAM: CHEST  2 VIEW COMPARISON:  Chest x-ray of May 05, 2016 FINDINGS: The lungs are mildly hyperinflated with hemidiaphragm flattening. There is persistent increased density at the left lung base with obscuration of the hemidiaphragm. The lung markings are coarse in the right infrahilar region but also stable. The heart and pulmonary vascularity are normal. There is calcification in the wall of the thoracic aorta. The bony thorax exhibits no acute abnormality. IMPRESSION: Chronic bronchitic changes, stable. No CHF. Persistent bibasilar densities greatest on the left which may reflect scarring but superimposed pneumonia is not excluded. Followup PA and lateral chest X-ray is recommended in 3-4 weeks following trial of antibiotic therapy to ensure resolution and exclude underlying malignancy. Thoracic aortic atherosclerosis. Electronically Signed   By: David  Martinique M.D.   On: 08/21/2016 14:17   US Venous Img Lower Bilateral  Result Date: 08/21/2016 CLINICAL DATA:  83 year old female with leg swelling for years. Initial encounter. EXAM: BILATERAL LOWER EXTREMITY VENOUS DOPPLER ULTRASOUND TECHNIQUE: Gray-scale sonography with graded  compression, as well as color Doppler and duplex ultrasound were performed to evaluate the lower extremity deep venous systems from the level of the common femoral vein and including the common femoral, femoral, profunda femoral, popliteal and calf veins including the posterior tibial, peroneal and gastrocnemius veins when visible. Spectral Doppler was utilized to evaluate flow at rest and with distal augmentation maneuvers in the common femoral, femoral and popliteal veins. COMPARISON:  None. FINDINGS: RIGHT LOWER EXTREMITY Common Femoral Vein: No evidence of thrombus. Normal compressibility, respiratory phasicity and response to augmentation. Saphenofemoral Junction: No evidence of thrombus. Normal compressibility and flow on color Doppler imaging. Profunda Femoral Vein: No evidence of thrombus. Normal compressibility and flow on color Doppler imaging. Femoral Vein: No evidence of thrombus. Normal compressibility, respiratory phasicity and response to augmentation. Popliteal Vein: No evidence of thrombus. Normal compressibility, respiratory phasicity and response to augmentation. Calf Veins: No evidence of thrombus. Normal compressibility and flow on color Doppler imaging. Other Findings:  None. LEFT LOWER EXTREMITY Common Femoral Vein: No evidence of thrombus. Normal compressibility, respiratory phasicity and response to augmentation. Saphenofemoral Junction: No evidence of thrombus. Normal compressibility and flow on color Doppler imaging. Profunda Femoral Vein: No evidence of thrombus. Normal compressibility and flow on color Doppler imaging. Femoral Vein: No evidence of thrombus. Normal compressibility, respiratory phasicity and response to augmentation. Popliteal Vein: No evidence of thrombus. Normal compressibility, respiratory phasicity and response to augmentation. Calf Veins: No evidence of thrombus. Normal compressibility and flow on color Doppler imaging. Other Findings:  None. IMPRESSION: No evidence  of DVT within either lower extremity. Evaluation of left peroneal vein limited by edema and bandage. Electronically Signed   By: Genia Del M.D.   On: 08/21/2016 17:18    Ultrasound with no DVT chest x-ray with chronic changes ____________________________________________   PROCEDURES  Procedure(s) performed: no  Procedures  Critical Care performed: no  Observation: no ____________________________________________   INITIAL IMPRESSION / ASSESSMENT AND PLAN / ED COURSE  Pertinent labs & imaging results that were available during my care of the patient were reviewed  by me and considered in my medical decision making (see chart for details).  The patient arrives short of breath with pleuritic chest pain and left lower extremity swelling. I'm concerned that she may have a pulmonary embolism's wife up so I have ordered a CT angiogram to evaluate. I discussed the study with the on-call radiologist at [facility] radiology, and unfortunately as her GFR is lower than 30 they cannot approve this imaging study. They are recommending a VQ scan which we both agree all and she'll be somewhat limited secondary to her abnormal chest x-ray.    The patient's ultrasound is negative for acute clot. At this point she'll require inpatient admission for VQ scan in full cardiac rule out.  ____________________________________________   FINAL CLINICAL IMPRESSION(S) / ED DIAGNOSES  Final diagnoses:  Chest pain, unspecified type  Elevated troponin  Chronic kidney disease, unspecified CKD stage  Hyponatremia      NEW MEDICATIONS STARTED DURING THIS VISIT:  New Prescriptions   No medications on file     Note:  This document was prepared using Dragon voice recognition software and may include unintentional dictation errors.     Darel Hong, MD 08/21/16 1754

## 2016-08-22 ENCOUNTER — Observation Stay: Payer: Medicare Other

## 2016-08-22 ENCOUNTER — Encounter: Payer: Self-pay | Admitting: Radiology

## 2016-08-22 LAB — TROPONIN I
Troponin I: 0.05 ng/mL (ref <0.03)
Troponin I: 0.06 ng/mL (ref <0.03)

## 2016-08-22 LAB — BASIC METABOLIC PANEL
Anion gap: 7 (ref 5–15)
BUN: 33 mg/dL — AB (ref 6–20)
CALCIUM: 8.4 mg/dL — AB (ref 8.9–10.3)
CHLORIDE: 101 mmol/L (ref 101–111)
CO2: 22 mmol/L (ref 22–32)
CREATININE: 1.6 mg/dL — AB (ref 0.44–1.00)
GFR, EST AFRICAN AMERICAN: 32 mL/min — AB (ref >60)
GFR, EST NON AFRICAN AMERICAN: 27 mL/min — AB (ref >60)
Glucose, Bld: 90 mg/dL (ref 65–99)
Potassium: 3.9 mmol/L (ref 3.5–5.1)
SODIUM: 130 mmol/L — AB (ref 135–145)

## 2016-08-22 MED ORDER — ALBUTEROL SULFATE (2.5 MG/3ML) 0.083% IN NEBU
2.5000 mg | INHALATION_SOLUTION | Freq: Four times a day (QID) | RESPIRATORY_TRACT | Status: DC
  Administered 2016-08-22 – 2016-08-23 (×4): 2.5 mg via RESPIRATORY_TRACT
  Filled 2016-08-22 (×4): qty 3

## 2016-08-22 MED ORDER — FUROSEMIDE 20 MG PO TABS
20.0000 mg | ORAL_TABLET | ORAL | Status: DC
  Administered 2016-08-22: 20 mg via ORAL
  Filled 2016-08-22 (×2): qty 1

## 2016-08-22 MED ORDER — PREDNISONE 10 MG PO TABS
10.0000 mg | ORAL_TABLET | Freq: Every day | ORAL | Status: DC
  Administered 2016-08-22 – 2016-08-30 (×9): 10 mg via ORAL
  Filled 2016-08-22 (×9): qty 1

## 2016-08-22 MED ORDER — DEXTROSE 5 % IV SOLN
1.0000 g | Freq: Every day | INTRAVENOUS | Status: AC
  Administered 2016-08-22 – 2016-08-28 (×7): 1 g via INTRAVENOUS
  Filled 2016-08-22 (×8): qty 10

## 2016-08-22 MED ORDER — DEXTROSE 5 % IV SOLN
500.0000 mg | Freq: Every day | INTRAVENOUS | Status: DC
  Administered 2016-08-22 – 2016-08-27 (×6): 500 mg via INTRAVENOUS
  Filled 2016-08-22 (×7): qty 500

## 2016-08-22 MED ORDER — TECHNETIUM TC 99M DIETHYLENETRIAME-PENTAACETIC ACID
32.8200 | Freq: Once | INTRAVENOUS | Status: AC | PRN
Start: 2016-08-22 — End: 2016-08-22
  Administered 2016-08-22: 32.82 via RESPIRATORY_TRACT

## 2016-08-22 MED ORDER — CLONIDINE HCL 0.1 MG PO TABS
0.1000 mg | ORAL_TABLET | Freq: Three times a day (TID) | ORAL | Status: DC
  Administered 2016-08-22 – 2016-08-28 (×18): 0.1 mg via ORAL
  Filled 2016-08-22 (×18): qty 1

## 2016-08-22 MED ORDER — TECHNETIUM TO 99M ALBUMIN AGGREGATED
3.8450 | Freq: Once | INTRAVENOUS | Status: AC | PRN
  Administered 2016-08-22: 3.845 via INTRAVENOUS

## 2016-08-22 NOTE — Progress Notes (Signed)
MD Pyreddy made aware of new troponin levels: last one being 0.06 no new orders received.

## 2016-08-22 NOTE — Care Management Note (Signed)
Case Management Note  Patient Details  Name: Joanne Michael MRN: [id number] Date of Birth: Nov 07, 1924  Subjective/Objective:                  Met with patient and her daughter Joanne Michael to explain MOON letter and discuss discharge planning with patient's permission. Joanne Michael is also patient's stated HCPOA. Patient is independent from home and recently closed out with home health services through [facility]. They appreciated service provided through [facility] however they do not feel she will need it again - but does agree if MD suggests. She depends on family for transportation. She is not on home O2 and not requiring during my visit today. She states her PCP is Dr. Netty Starring with [facility]. She states she gets her medications through [facility] without problems.   Action/Plan: No current RNCM identified needs but will follow.  Expected Discharge Date:                  Expected Discharge Plan:     In-House Referral:     Discharge planning Services  CM Consult  Post Acute Care Choice:  Home Health Choice offered to:  Patient, Adult Children  DME Arranged:    DME Agency:     HH Arranged:    [facility] Agency:   ([facility] if medically necessary)  Status of Service:  In process, will continue to follow  If discussed at Long Length of Stay Meetings, dates discussed:    Additional Comments:  Marshell Garfinkel, RN 08/22/2016, 10:53 AM

## 2016-08-22 NOTE — Progress Notes (Signed)
[place] at [place] NAME: Joanne Michael    MR#:  [id number]  DATE OF BIRTH:  Apr 15, 1924  SUBJECTIVE: Admitted for chest pain, shortness of breath. Cardiac workup negative,V Q scan also is negative. Patient still has shortness of breath and hoarse voice. She says symptoms started she was at [place] was  Filled with mold /  CHIEF COMPLAINT:   Chief Complaint  Patient presents with  . Chest Pain    REVIEW OF SYSTEMS:   ROS CONSTITUTIONAL: No fever, fatigue or weakness.  EYES: No blurred or double vision.  EARS, NOSE, AND THROAT: No tinnitus or ear pain.  RESPIRATORY: cough And shortness of breath.  CARDIOVASCULAR: No chest pain, orthopnea, edema.  GASTROINTESTINAL: No nausea, vomiting, diarrhea or abdominal pain.  GENITOURINARY: No dysuria, hematuria.  ENDOCRINE: No polyuria, nocturia,  HEMATOLOGY: No anemia, easy bruising or bleeding SKIN: No rash or lesion. MUSCULOSKELETAL: No joint pain or arthritis.   NEUROLOGIC: No tingling, numbness, weakness.  PSYCHIATRY: No anxiety or depression.   DRUG ALLERGIES:   Allergies  Allergen Reactions  . Oxycodone Itching  . Codeine Nausea And Vomiting and Itching  . Hydrocodone-Acetaminophen Other (See Comments)    Hallucinates  . Iodine Swelling, Other (See Comments), Itching and Nausea Only    Patient is allergic to seafood and this is the cause why she can't take medication.  . Meperidine Hcl     Unknown reactions.  . Other Swelling    Seafood- tongue swelling and discoloration Patient allergic to seafood, tongue swelling and discoloration.  . Propoxyphene N-Acetaminophen Other (See Comments)    Unknown reaction    VITALS:  Blood pressure (!) 137/32, pulse (!) 41, temperature 99.5 F (37.5 C), temperature source Oral, resp. rate 18, height 4\' 11"  (1.499 m), weight 55.9 kg (123 lb 4.8 oz), SpO2 95 %.  PHYSICAL EXAMINATION:  GENERAL:  83 y.o.-year-old  patient lying in the bed with no acute distress.  EYES: Pupils equal, round, reactive to light and accommodation. No scleral icterus. Extraocular muscles intact.  HEENT: Head atraumatic, normocephalic. Oropharynx and nasopharynx clear.  NECK:  Supple, no jugular venous distention. No thyroid enlargement, no tenderness.  LUNGS: Normal breath sounds bilaterally, no wheezing, rales,rhonchi or crepitation. No use of accessory muscles of respiration.  CARDIOVASCULAR: S1, S2 normal. No murmurs, rubs, or gallops.  ABDOMEN: Soft, nontender, nondistended. Bowel sounds present. No organomegaly or mass.  EXTREMITIES: No pedal edema, cyanosis, or clubbing.  NEUROLOGIC: Cranial nerves II through XII are intact. Muscle strength 5/5 in all extremities. Sensation intact. Gait not checked.  PSYCHIATRIC: The patient is alert and oriented x 3.  SKIN: No obvious rash, lesion, or ulcer.    LABORATORY PANEL:   CBC  Recent Labs Lab 08/21/16 1325  WBC 13.1*  HGB 10.6*  HCT 30.5*  PLT 254   ------------------------------------------------------------------------------------------------------------------  Chemistries   Recent Labs Lab 08/22/16 0125  NA 130*  K 3.9  CL 101  CO2 22  GLUCOSE 90  BUN 33*  CREATININE 1.60*  CALCIUM 8.4*   ------------------------------------------------------------------------------------------------------------------  Cardiac Enzymes  Recent Labs Lab 08/22/16 0502  TROPONINI 0.06*   ------------------------------------------------------------------------------------------------------------------  RADIOLOGY:  Dg Chest 2 View  Result Date: 08/21/2016 CLINICAL DATA:  Mid chest pain with shortness of breath which is got worse over the past week. Nonsmoker, history of hypertension, aortic stenosis, primary pulmonary hypertension, history of mycobacterial lung infection. EXAM: CHEST  2 VIEW COMPARISON:  Chest x-ray of May 05, 2016 FINDINGS: The lungs are mildly  hyperinflated with hemidiaphragm flattening. There is persistent increased density at the left lung base with obscuration of the hemidiaphragm. The lung markings are coarse in the right infrahilar region but also stable. The heart and pulmonary vascularity are normal. There is calcification in the wall of the thoracic aorta. The bony thorax exhibits no acute abnormality. IMPRESSION: Chronic bronchitic changes, stable. No CHF. Persistent bibasilar densities greatest on the left which may reflect scarring but superimposed pneumonia is not excluded. Followup PA and lateral chest X-ray is recommended in 3-4 weeks following trial of antibiotic therapy to ensure resolution and exclude underlying malignancy. Thoracic aortic atherosclerosis. Electronically Signed   By: David  Martinique M.D.   On: 08/21/2016 14:17   Nm Pulmonary Perf And Vent  Result Date: 08/22/2016 CLINICAL DATA:  Pleuritic chest pain. EXAM: NUCLEAR MEDICINE VENTILATION - PERFUSION LUNG SCAN TECHNIQUE: Ventilation images were obtained in multiple projections using inhaled aerosol Tc-67m DTPA. Perfusion images were obtained in multiple projections after intravenous injection of Tc-32m MAA. RADIOPHARMACEUTICALS:  32.8 MCi Technetium-17m DTPA aerosol inhalation and 3.8 mCi Technetium-39m MAA IV COMPARISON:  08/21/2016. FINDINGS: Prominent patchy ventilation defects. Tiny perfusion defects addendum after smaller than ventilation defects. This scan is low probability for pulmonary embolus . IMPRESSION: Low probability for pulmonary embolus. Very poor ventilation suggesting COPD. Electronically Signed   By: Marcello Moores  Register   On: 08/22/2016 11:59   US Venous Img Lower Bilateral  Result Date: 08/21/2016 CLINICAL DATA:  83 year old female with leg swelling for years. Initial encounter. EXAM: BILATERAL LOWER EXTREMITY VENOUS DOPPLER ULTRASOUND TECHNIQUE: Gray-scale sonography with graded compression, as well as color Doppler and duplex ultrasound were  performed to evaluate the lower extremity deep venous systems from the level of the common femoral vein and including the common femoral, femoral, profunda femoral, popliteal and calf veins including the posterior tibial, peroneal and gastrocnemius veins when visible. Spectral Doppler was utilized to evaluate flow at rest and with distal augmentation maneuvers in the common femoral, femoral and popliteal veins. COMPARISON:  None. FINDINGS: RIGHT LOWER EXTREMITY Common Femoral Vein: No evidence of thrombus. Normal compressibility, respiratory phasicity and response to augmentation. Saphenofemoral Junction: No evidence of thrombus. Normal compressibility and flow on color Doppler imaging. Profunda Femoral Vein: No evidence of thrombus. Normal compressibility and flow on color Doppler imaging. Femoral Vein: No evidence of thrombus. Normal compressibility, respiratory phasicity and response to augmentation. Popliteal Vein: No evidence of thrombus. Normal compressibility, respiratory phasicity and response to augmentation. Calf Veins: No evidence of thrombus. Normal compressibility and flow on color Doppler imaging. Other Findings:  None. LEFT LOWER EXTREMITY Common Femoral Vein: No evidence of thrombus. Normal compressibility, respiratory phasicity and response to augmentation. Saphenofemoral Junction: No evidence of thrombus. Normal compressibility and flow on color Doppler imaging. Profunda Femoral Vein: No evidence of thrombus. Normal compressibility and flow on color Doppler imaging. Femoral Vein: No evidence of thrombus. Normal compressibility, respiratory phasicity and response to augmentation. Popliteal Vein: No evidence of thrombus. Normal compressibility, respiratory phasicity and response to augmentation. Calf Veins: No evidence of thrombus. Normal compressibility and flow on color Doppler imaging. Other Findings:  None. IMPRESSION: No evidence of DVT within either lower extremity. Evaluation of left peroneal  vein limited by edema and bandage. Electronically Signed   By: Genia Del M.D.   On: 08/21/2016 17:18    EKG:   Orders placed or performed during the hospital encounter of 08/21/16  . EKG 12-Lead  . EKG 12-Lead  .  ED EKG within 10 minutes  . ED EKG within 10 minutes    ASSESSMENT AND PLAN:   Chest pain, shortness of breath likely secondary to reactive airway disease and also developing pneumonia. Had IV antibiotics, nebulizer, steroids and see how she does.   shortness of breath started [place] the [place] ,possibly has environmental trigger causing bronchospasm.  #2/chronic kidney disease status remains stable  #3 .  chronicDiastolic heart failure ,no acute CHF.  4. Essential hypertension, bradycardia. Stop Coreg, clonidine because of bradycardia, continue Plavix, furosemide, hydralazine D/w family.,RN   All the records are reviewed and case discussed with Care Management/Social Workerr. Management plans discussed with the patient, family and they are in agreement.  CODE STATUS:full  TOTAL TIME TAKING CARE OF THIS PATIENT: 35 minutes.   POSSIBLE D/C IN 1-2DAYS, DEPENDING ON CLINICAL CONDITION.   Epifanio Lesches M.D on 08/22/2016 at 12:32 PM  Between 7am to 6pm - Pager - [phone]  After 6pm go to [email or web address] - password [email or web address] [facility]  Office  [phone]  CC: Primary care physician; Dion Body, MD   Note: This dictation was prepared with Dragon dictation along with smaller phrase technology. Any transcriptional errors that result from this process are unintentional.

## 2016-08-22 NOTE — Care Management Obs Status (Signed)
[facility] NOTIFICATION   Patient Details  Name: Joanne Michael MRN: [id number] Date of Birth: [birth year]   Medicare Observation Status Notification Given:  Yes    Marshell Garfinkel, RN 08/22/2016, 10:52 AM

## 2016-08-23 ENCOUNTER — Observation Stay: Payer: Medicare Other

## 2016-08-23 DIAGNOSIS — N183 Chronic kidney disease, stage 3 unspecified: Secondary | ICD-10-CM

## 2016-08-23 DIAGNOSIS — R0603 Acute respiratory distress: Secondary | ICD-10-CM

## 2016-08-23 DIAGNOSIS — I4891 Unspecified atrial fibrillation: Secondary | ICD-10-CM | POA: Diagnosis not present

## 2016-08-23 DIAGNOSIS — I5033 Acute on chronic diastolic (congestive) heart failure: Secondary | ICD-10-CM

## 2016-08-23 LAB — BASIC METABOLIC PANEL
Anion gap: 10 (ref 5–15)
BUN: 36 mg/dL — AB (ref 6–20)
CHLORIDE: 104 mmol/L (ref 101–111)
CO2: 21 mmol/L — ABNORMAL LOW (ref 22–32)
CREATININE: 1.84 mg/dL — AB (ref 0.44–1.00)
Calcium: 8.9 mg/dL (ref 8.9–10.3)
GFR calc Af Amer: 27 mL/min — ABNORMAL LOW (ref >60)
GFR, EST NON AFRICAN AMERICAN: 23 mL/min — AB (ref >60)
GLUCOSE: 178 mg/dL — AB (ref 65–99)
Potassium: 3.8 mmol/L (ref 3.5–5.1)
SODIUM: 135 mmol/L (ref 135–145)

## 2016-08-23 LAB — CBC
HEMATOCRIT: 29.4 % — AB (ref 35.0–47.0)
Hemoglobin: 10 g/dL — ABNORMAL LOW (ref 12.0–16.0)
MCH: 31.1 pg (ref 26.0–34.0)
MCHC: 33.8 g/dL (ref 32.0–36.0)
MCV: 91.8 fL (ref 80.0–100.0)
Platelets: 249 10*3/uL (ref 150–440)
RBC: 3.21 MIL/uL — ABNORMAL LOW (ref 3.80–5.20)
RDW: 13.9 % (ref 11.5–14.5)
WBC: 15.8 10*3/uL — ABNORMAL HIGH (ref 3.6–11.0)

## 2016-08-23 LAB — TSH: TSH: 6.375 u[IU]/mL — AB (ref 0.350–4.500)

## 2016-08-23 LAB — MAGNESIUM: Magnesium: 1.9 mg/dL (ref 1.7–2.4)

## 2016-08-23 MED ORDER — FUROSEMIDE 10 MG/ML IJ SOLN
40.0000 mg | Freq: Two times a day (BID) | INTRAMUSCULAR | Status: DC
Start: 2016-08-23 — End: 2016-08-24
  Administered 2016-08-23 (×2): 40 mg via INTRAVENOUS
  Filled 2016-08-23 (×2): qty 4

## 2016-08-23 MED ORDER — DILTIAZEM HCL 30 MG PO TABS
30.0000 mg | ORAL_TABLET | Freq: Four times a day (QID) | ORAL | Status: DC | PRN
  Administered 2016-08-23 – 2016-08-24 (×2): 30 mg via ORAL
  Filled 2016-08-23 (×2): qty 1

## 2016-08-23 MED ORDER — AMIODARONE HCL IN DEXTROSE 360-4.14 MG/200ML-% IV SOLN
INTRAVENOUS | Status: AC
  Filled 2016-08-23: qty 200

## 2016-08-23 MED ORDER — AMIODARONE IV BOLUS ONLY 150 MG/100ML
150.0000 mg | Freq: Once | INTRAVENOUS | Status: AC
  Administered 2016-08-23: 150 mg via INTRAVENOUS
  Filled 2016-08-23: qty 100

## 2016-08-23 MED ORDER — LEVALBUTEROL HCL 0.63 MG/3ML IN NEBU
0.6300 mg | INHALATION_SOLUTION | Freq: Three times a day (TID) | RESPIRATORY_TRACT | Status: DC
  Administered 2016-08-23 – 2016-08-30 (×21): 0.63 mg via RESPIRATORY_TRACT
  Filled 2016-08-23 (×21): qty 3

## 2016-08-23 MED ORDER — ALBUTEROL SULFATE (2.5 MG/3ML) 0.083% IN NEBU: 2.5000 mg | INHALATION_SOLUTION | Freq: Two times a day (BID) | RESPIRATORY_TRACT | Status: DC

## 2016-08-23 MED ORDER — APIXABAN 2.5 MG PO TABS
2.5000 mg | ORAL_TABLET | Freq: Two times a day (BID) | ORAL | Status: DC
  Administered 2016-08-23 – 2016-08-30 (×15): 2.5 mg via ORAL
  Filled 2016-08-23 (×15): qty 1

## 2016-08-23 MED ORDER — DILTIAZEM 12 MG/ML ORAL SUSPENSION
30.0000 mg | Freq: Four times a day (QID) | ORAL | Status: DC
  Filled 2016-08-23 (×3): qty 3

## 2016-08-23 NOTE — Progress Notes (Addendum)
MD Sissy Hoff made aware that pt's HR is sustaining in the 130-140's and still A-fib after the amio bolus, verbal orders received for Cardizem PO 30 mg for HR greater than 100 b/m PRN, EKG, and cardiology consult, orders have been entered, incoming nurse in room and aware of new orders.

## 2016-08-23 NOTE — Progress Notes (Addendum)
MD has been paged to make him aware that pt converted to A-fib when walking to bathroom with a HR of 170 sustaining in the 130-150's. Awaiting on Dr. To call back.  0630 Spoke to Dr. Marcille Blanco and made him aware of the situation and that pt's HR is been sustaining in the 13-150's for about 20 minutes. MD to enter any orders.

## 2016-08-23 NOTE — Progress Notes (Signed)
Converted to normal sinus rhythm with a heart rate of 84.

## 2016-08-23 NOTE — Progress Notes (Signed)
[place] at [place] NAME: Joanne Michael    MR#:  [id number]  DATE OF BIRTH:  1924/04/12  Had atrial fibrillation with RVR last night, received amiodarone. This morning still in A. fib with heart rate in 100 100s. Started on Cardizem, seen by cardiology, started on eliquis, patient denies any chest pain or shortness of breath, she says she is feeling better. Now is sinus rhythm with HR 80s.  CHIEF COMPLAINT:   Chief Complaint  Patient presents with  . Chest Pain    REVIEW OF SYSTEMS:   ROS CONSTITUTIONAL: No fever, fatigue or weakness.  EYES: No blurred or double vision.  EARS, NOSE, AND THROAT: No tinnitus or ear pain.  RESPIRATORY: cough And shortness of breath.  CARDIOVASCULAR: No chest pain, orthopnea, edema.  GASTROINTESTINAL: No nausea, vomiting, diarrhea or abdominal pain.  GENITOURINARY: No dysuria, hematuria.  ENDOCRINE: No polyuria, nocturia,  HEMATOLOGY: No anemia, easy bruising or bleeding SKIN: No rash or lesion. MUSCULOSKELETAL: No joint pain or arthritis.   NEUROLOGIC: No tingling, numbness, weakness.  PSYCHIATRY: No anxiety or depression.   DRUG ALLERGIES:   Allergies  Allergen Reactions  . Oxycodone Itching  . Codeine Nausea And Vomiting and Itching  . Hydrocodone-Acetaminophen Other (See Comments)    Hallucinates  . Iodine Swelling, Other (See Comments), Itching and Nausea Only    Patient is allergic to seafood and this is the cause why she can't take medication.  . Meperidine Hcl     Unknown reactions.  . Other Swelling    Seafood- tongue swelling and discoloration Patient allergic to seafood, tongue swelling and discoloration.  . Propoxyphene N-Acetaminophen Other (See Comments)    Unknown reaction    VITALS:  Blood pressure 114/66, pulse (!) 126, temperature 98.1 F (36.7 C), temperature source Oral, resp. rate 18, height 4\' 11"  (1.499 m), weight 55.9 kg (123 lb 4.8 oz), SpO2 98 %.  PHYSICAL  EXAMINATION:  GENERAL:  83 y.o.-year-old patient lying in the bed with no acute distress.  EYES: Pupils equal, round, reactive to light and accommodation. No scleral icterus. Extraocular muscles intact.  HEENT: Head atraumatic, normocephalic. Oropharynx and nasopharynx clear.  NECK:  Supple, no jugular venous distention. No thyroid enlargement, no tenderness.  LUNGS: decreased breath sounds in the left base.  CARDIOVASCULAR: S1, S2 normal. No murmurs, rubs, or gallops.  ABDOMEN: Soft, nontender, nondistended. Bowel sounds present. No organomegaly or mass.  EXTREMITIES: No pedal edema, cyanosis, or clubbing.  NEUROLOGIC: Cranial nerves II through XII are intact. Muscle strength 5/5 in all extremities. Sensation intact. Gait not checked.  PSYCHIATRIC: The patient is alert and oriented x 3.  SKIN: No obvious rash, lesion, or ulcer.    LABORATORY PANEL:   CBC  Recent Labs Lab 08/23/16 1038  WBC 15.8*  HGB 10.0*  HCT 29.4*  PLT 249   ------------------------------------------------------------------------------------------------------------------  Chemistries   Recent Labs Lab 08/22/16 0125  NA 130*  K 3.9  CL 101  CO2 22  GLUCOSE 90  BUN 33*  CREATININE 1.60*  CALCIUM 8.4*   ------------------------------------------------------------------------------------------------------------------  Cardiac Enzymes  Recent Labs Lab 08/22/16 0502  TROPONINI 0.06*   ------------------------------------------------------------------------------------------------------------------  RADIOLOGY:  Dg Chest 2 View  Result Date: 08/21/2016 CLINICAL DATA:  Mid chest pain with shortness of breath which is got worse over the past week. Nonsmoker, history of hypertension, aortic stenosis, primary pulmonary hypertension, history of mycobacterial lung infection. EXAM: CHEST  2 VIEW COMPARISON:  Chest x-ray of May 05, 2016 FINDINGS: The lungs are mildly hyperinflated with hemidiaphragm  flattening. There is persistent increased density at the left lung base with obscuration of the hemidiaphragm. The lung markings are coarse in the right infrahilar region but also stable. The heart and pulmonary vascularity are normal. There is calcification in the wall of the thoracic aorta. The bony thorax exhibits no acute abnormality. IMPRESSION: Chronic bronchitic changes, stable. No CHF. Persistent bibasilar densities greatest on the left which may reflect scarring but superimposed pneumonia is not excluded. Followup PA and lateral chest X-ray is recommended in 3-4 weeks following trial of antibiotic therapy to ensure resolution and exclude underlying malignancy. Thoracic aortic atherosclerosis. Electronically Signed   By: David  Martinique M.D.   On: 08/21/2016 14:17   Nm Pulmonary Perf And Vent  Result Date: 08/22/2016 CLINICAL DATA:  Pleuritic chest pain. EXAM: NUCLEAR MEDICINE VENTILATION - PERFUSION LUNG SCAN TECHNIQUE: Ventilation images were obtained in multiple projections using inhaled aerosol Tc-85m DTPA. Perfusion images were obtained in multiple projections after intravenous injection of Tc-71m MAA. RADIOPHARMACEUTICALS:  32.8 MCi Technetium-52m DTPA aerosol inhalation and 3.8 mCi Technetium-33m MAA IV COMPARISON:  08/21/2016. FINDINGS: Prominent patchy ventilation defects. Tiny perfusion defects addendum after smaller than ventilation defects. This scan is low probability for pulmonary embolus . IMPRESSION: Low probability for pulmonary embolus. Very poor ventilation suggesting COPD. Electronically Signed   By: Marcello Moores  Register   On: 08/22/2016 11:59   US Venous Img Lower Bilateral  Result Date: 08/21/2016 CLINICAL DATA:  83 year old female with leg swelling for years. Initial encounter. EXAM: BILATERAL LOWER EXTREMITY VENOUS DOPPLER ULTRASOUND TECHNIQUE: Gray-scale sonography with graded compression, as well as color Doppler and duplex ultrasound were performed to evaluate the lower extremity  deep venous systems from the level of the common femoral vein and including the common femoral, femoral, profunda femoral, popliteal and calf veins including the posterior tibial, peroneal and gastrocnemius veins when visible. Spectral Doppler was utilized to evaluate flow at rest and with distal augmentation maneuvers in the common femoral, femoral and popliteal veins. COMPARISON:  None. FINDINGS: RIGHT LOWER EXTREMITY Common Femoral Vein: No evidence of thrombus. Normal compressibility, respiratory phasicity and response to augmentation. Saphenofemoral Junction: No evidence of thrombus. Normal compressibility and flow on color Doppler imaging. Profunda Femoral Vein: No evidence of thrombus. Normal compressibility and flow on color Doppler imaging. Femoral Vein: No evidence of thrombus. Normal compressibility, respiratory phasicity and response to augmentation. Popliteal Vein: No evidence of thrombus. Normal compressibility, respiratory phasicity and response to augmentation. Calf Veins: No evidence of thrombus. Normal compressibility and flow on color Doppler imaging. Other Findings:  None. LEFT LOWER EXTREMITY Common Femoral Vein: No evidence of thrombus. Normal compressibility, respiratory phasicity and response to augmentation. Saphenofemoral Junction: No evidence of thrombus. Normal compressibility and flow on color Doppler imaging. Profunda Femoral Vein: No evidence of thrombus. Normal compressibility and flow on color Doppler imaging. Femoral Vein: No evidence of thrombus. Normal compressibility, respiratory phasicity and response to augmentation. Popliteal Vein: No evidence of thrombus. Normal compressibility, respiratory phasicity and response to augmentation. Calf Veins: No evidence of thrombus. Normal compressibility and flow on color Doppler imaging. Other Findings:  None. IMPRESSION: No evidence of DVT within either lower extremity. Evaluation of left peroneal vein limited by edema and bandage.  Electronically Signed   By: Genia Del M.D.   On: 08/21/2016 17:18   Dg Chest Port 1 View  Result Date: 08/23/2016 CLINICAL DATA:  New onset of atrial fibrillation, hypertension EXAM: PORTABLE CHEST 1 VIEW COMPARISON:  Chest x-ray of 08/21/2016 hand 05/05/2016 FINDINGS: There is persistent opacity at the left lung base with apparent left pleural effusion. In view of the persistent this opacity, CT of the chest is recommended to exclude underlying neoplasm. The right lung is clear. Mediastinal and hilar contours are unremarkable. The heart is within upper limits of normal and stable. No bony abnormality is seen IMPRESSION: Persistent opacity at the left lung base consistent with effusion, atelectasis and possibly pneumonia. However in view of the persistence of this opacity, CT of the chest is recommended to exclude underlying neoplasm. Electronically Signed   By: Ivar Drape M.D.   On: 08/23/2016 10:39    EKG:   Orders placed or performed during the hospital encounter of 08/21/16  . EKG 12-Lead  . EKG 12-Lead  . ED EKG within 10 minutes  . ED EKG within 10 minutes  . EKG 12-Lead  . EKG 12-Lead    ASSESSMENT AND PLAN:   Chest pain, shortness of breath likely secondary to reactive airway disease And left lower lobe pneumonia. Continue IV Rocephin, Zithromax, change his albuterol to  xopenox,, nebulizer , #2/chronic kidney disease status remains stable  #3 .  Acute on chronic diastolic heart failure with elevated BNP. Continue IV Lasix, seen by cardiology. Echocardiogram shows EF more than 55%.    4. Essential hypertension, bradycardia. Stop Coreg, clonidine because of bradycardia, continue Plavix, furosemide, hydralazine  #5 new onset A. fib: Likely due to respiratory distress, converted to sinus rhythm now, continue Coreg, started on  Eliquis. 6. History of renal artery  Stent  3 months ago, on Plavix. Will discuss with vascular to see if we can  Stop  Plavix and continue eliquis   only.  D/w family.,RN   All the records are reviewed and case discussed with Care Management/Social Workerr. Management plans discussed with the patient, family and they are in agreement.  CODE STATUS:full  TOTAL TIME TAKING CARE OF THIS PATIENT: 35 minutes.   POSSIBLE D/C IN 1-2DAYS, DEPENDING ON CLINICAL CONDITION.   Epifanio Lesches M.D on 08/23/2016 at 11:20 AM  Between 7am to 6pm - Pager - [phone]  After 6pm go to [email or web address] - password [email or web address] [facility]  Office  [phone]  CC: Primary care physician; Dion Body, MD   Note: This dictation was prepared with Dragon dictation along with smaller phrase technology. Any transcriptional errors that result from this process are unintentional.

## 2016-08-23 NOTE — Progress Notes (Signed)
Bolus of 100 ml of amiodarone completed, pt's HR is sustaining in the  120-130's at this time, will continue to monitor.

## 2016-08-23 NOTE — Consult Note (Signed)
Cardiology Consultation Note  Patient ID: Joanne Michael, MRN: [id number], DOB/AGE: Apr 19, 1924 83 y.o. Admit date: 08/21/2016   Date of Consult: 08/23/2016 Primary Physician: Dion Body, MD Primary Cardiologist: Dr. Rockey Situ, MD Requesting Physician: Dr. Vianne Bulls, MD  Chief Complaint: Pleuritic chest pain Reason for Consult: New onset Afib with RVR  HPI: Joanne Michael is a 83 y.o. female who is being seen today for the evaluation of new onset Afib with RVR at the request of Dr. Vianne Bulls, MD. Patient has a h/o Mild to moderate aortic stenosis by recent echocardiogram in early June 2018, CKD stage III, prior episode of near syncope in the setting of dehydration, mild carotid arterial disease, renal artery stenosis status post renal artery stenting in 04/2016 on Plavix at home, chronic lower extremity swelling that has previously been exacerbated by amlodipine, previously poorly controlled hypertension now improved status post renal artery stenting, hyperlipidemia, and GERD who presented to [facility] with one-day history of pleuritic chest pain.  On 6/20 5 in the evening patient developed onset of chest pain but All night. Pain was located on center of her chest and worse with deep inspiration and coughing. There was some associated shortness of breath however no associated palpitations, nausea, vomiting, diaphoresis, dizziness, presyncope, or syncope. Patient had recently stayed a beach house in [place] that she reports was "full of mold." Because of her pleuritic chest pain she was brought to [facility] ED for further evaluation.  Admission chest x-ray showed chronic bronchitis without CHF possible left lower lobe pneumonia. Given her lower 70 swelling bilateral lower artery ultrasound was obtained which was found to be negative for DVT. Given patient's elevated d-dimer she underwent VQ scan (renal function precluded CTA chest) which was low risk for PE. She has been receiving antibiotics, steroids,  and breathing treatments per IM. Her breathing is about the same as when she presented to the hospital.  This morningAt 6:51 AM, patient developed new onset A. fib with heart rates in the 130s to 170s bpm. She was given amiodarone bolus with heart rates sustaining in the 120s to 130s. She was started on by mouth diltiazem 30 mg for heart rate greater than 100 BPM  Patient spontaneously converted to normal sinus rhythm at 8:51 AM with heart rates ranging in the 70s bpm. She remains in sinus rhythm at this time. She was asymptomatic throughout the above, denying any increased shortness of breath, palpitations, or dizziness. No chest pain. Most recent labs from 6/27 showed potassium 3.9, serum creatinine 1.6 (baseline approximately 2), mildly elevated troponin at 0.05 trending to 0.06. Most recent CBC from 6/26 shows white blood cell count 13,100, hemoglobin 10.6, platelet count 254. Vital signs have remained stable.  Past Medical History:  Diagnosis Date  . Arthritis   . Cancer ([place])    melanoma  . Cat allergies   . Chronic renal failure   . Fatigue   . GERD (gastroesophageal reflux disease)   . Gout   . H/O Clostridium difficile infection   . Hay fever    as child  . Hiatal hernia   . HLD (hyperlipidemia)   . HTN (hypertension)   . Iron deficiency anemia   . Spinal stenosis       Most Recent Cardiac Studies: TTE 08/01/2016: Study Conclusions  - Left ventricle: The cavity size was normal. Wall thickness was   increased in a pattern of moderate LVH. Systolic function was   vigorous. The estimated ejection fraction was in the range of  65%   to 70%. Wall motion was normal; there were no regional wall   motion abnormalities. Doppler parameters are consistent with   abnormal left ventricular relaxation (grade 1 diastolic   dysfunction). - Aortic valve: Probably trileaflet; moderately thickened leaflets.   Cusp separation was reduced. Transvalvular velocity was   increased. There was  mild to moderate stenosis. There was mild to   moderate regurgitation. Valve area (VTI): 1.22 cm^2. Valve area   (Vmean): 1.02 cm^2. - Mitral valve: Mildly thickened leaflets . There was mild   regurgitation. - Right ventricle: The cavity size was normal. Wall thickness was   normal. Systolic function was normal.   Surgical History:  Past Surgical History:  Procedure Laterality Date  . APPENDECTOMY    . BACK SURGERY    . CATARACT EXTRACTION    . CHOLECYSTECTOMY    . feet surgery    . HERNIA REPAIR    . knee replacement- both    . MELANOMA EXCISION    . NOSE SURGERY    . RENAL ANGIOGRAPHY N/A 05/07/2016   Procedure: Renal Angiography;  Surgeon: Algernon Huxley, MD;  Location: [facility];  Service: Cardiovascular;  Laterality: N/A;  . ROTATOR CUFF REPAIR     left  . SHOULDER SURGERY    . TONSILLECTOMY    . TOTAL ABDOMINAL HYSTERECTOMY       Home Meds: Prior to Admission medications   Medication Sig Start Date End Date Taking? Authorizing Provider  albuterol (PROVENTIL HFA;VENTOLIN HFA) 108 (90 BASE) MCG/ACT inhaler Inhale 1-2 puffs into the lungs every 6 (six) hours as needed for wheezing or shortness of breath. 02/14/15  Yes Minna Merritts, MD  allopurinol (ZYLOPRIM) 100 MG tablet Take 100 mg by mouth 2 (two) times daily.   Yes [provider]  atorvastatin (LIPITOR) 20 MG tablet Take 20 mg by mouth at bedtime.    Yes [provider]  Calcium Carbonate-Vitamin D (CALTRATE 600+D) 600-400 MG-UNIT per tablet Take 1 tablet by mouth daily.     Yes [provider]  carvedilol (COREG) 3.125 MG tablet Take 3.125 mg by mouth 2 (two) times daily. 04/06/16 04/06/17 Yes [provider]  cetirizine (ZYRTEC) 10 MG chewable tablet Chew 10 mg by mouth daily.     Yes [provider]  cloNIDine (CATAPRES) 0.2 MG tablet Take 0.2 mg by mouth 3 (three) times daily.    Yes [provider]  clopidogrel (PLAVIX) 75 MG tablet Take 1 tablet (75 mg  total) by mouth daily. 05/10/16  Yes Fritzi Mandes, MD  collagenase (SANTYL) ointment Apply 1 application topically daily.   Yes [provider]  ferrous sulfate 325 (65 FE) MG tablet Take 325 mg by mouth daily with breakfast.   Yes [provider]  fluticasone (FLONASE) 50 MCG/ACT nasal spray Place 2 sprays into both nostrils at bedtime as needed for allergies or rhinitis. 05/25/15  Yes Sudini, Alveta Heimlich, MD  furosemide (LASIX) 20 MG tablet Take 20 mg by mouth See admin instructions. Takes 1 tab on Monday, Wed, and Friday    Yes [provider]  hydrALAZINE (APRESOLINE) 25 MG tablet Take 1 tablet (25 mg total) by mouth every 8 (eight) hours. Patient taking differently: Take 25 mg by mouth 2 (two) times daily.  05/09/16  Yes Fritzi Mandes, MD  Multiple Vitamin (MULTIVITAMIN) tablet Take 1 tablet by mouth daily.   Yes [provider]  pantoprazole (PROTONIX) 40 MG tablet Take 40 mg by mouth  at bedtime.  02/14/15  Yes Minna Merritts, MD  Probiotic Product ([place]) Take 1 capsule by mouth daily.   Yes [provider]  docusate sodium (COLACE) 100 MG capsule Take 1 capsule (100 mg total) by mouth daily as needed. Patient not taking: Reported on 08/21/2016 05/03/16 05/03/17  Darel Hong, MD    Inpatient Medications:  . albuterol  2.5 mg Nebulization BID  . allopurinol  100 mg Oral BID  . aspirin EC  81 mg Oral Daily  . atorvastatin  20 mg Oral QHS  . calcium-vitamin D  1 tablet Oral Q breakfast  . cloNIDine  0.1 mg Oral TID  . clopidogrel  75 mg Oral Daily  . collagenase  1 application Topical Daily  . ferrous sulfate  325 mg Oral Q breakfast  . furosemide  40 mg Intravenous Q12H  . heparin  5,000 Units Subcutaneous Q8H  . hydrALAZINE  25 mg Oral BID  . loratadine  10 mg Oral Daily  . multivitamin with minerals  1 tablet Oral Daily  . pantoprazole  40 mg Oral QHS  . predniSONE  10 mg Oral Daily   . azithromycin 500 mg (08/23/16 0910)    . cefTRIAXone (ROCEPHIN)  IV 1 g (08/23/16 1610)    Allergies:  Allergies  Allergen Reactions  . Oxycodone Itching  . Codeine Nausea And Vomiting and Itching  . Hydrocodone-Acetaminophen Other (See Comments)    Hallucinates  . Iodine Swelling, Other (See Comments), Itching and Nausea Only    Patient is allergic to seafood and this is the cause why she can't take medication.  . Meperidine Hcl     Unknown reactions.  . Other Swelling    Seafood- tongue swelling and discoloration Patient allergic to seafood, tongue swelling and discoloration.  . Propoxyphene N-Acetaminophen Other (See Comments)    Unknown reaction    Social History   Social History  . Marital status: Widowed    Spouse name: N/A  . Number of children: N/A  . Years of education: N/A   Occupational History  . Not on file.   Social History Main Topics  . Smoking status: Never Smoker  . Smokeless tobacco: Never Used     Comment: Tobacco use- no   . Alcohol use 1.8 oz/week    3 Glasses of wine per week  . Drug use: No  . Sexual activity: No   Other Topics Concern  . Not on file   Social History Narrative   Retired. Does not regularly exercise.      Family History  Problem Relation Age of Onset  . Ovarian cancer Mother   . Liver cancer Father   . Heart failure Father   . Lung cancer Brother   . Prostate cancer Brother   . Lung cancer Sister      Review of Systems: Review of Systems  Constitutional: Positive for malaise/fatigue. Negative for chills, diaphoresis, fever and weight loss.  HENT: Negative for congestion.   Eyes: Negative for discharge and redness.  Respiratory: Positive for cough and shortness of breath. Negative for hemoptysis, sputum production and wheezing.   Cardiovascular: Positive for palpitations and leg swelling. Negative for chest pain, orthopnea, claudication and PND.  Gastrointestinal: Negative for abdominal pain, blood in stool, heartburn, melena, nausea and vomiting.   Genitourinary: Negative for hematuria.  Musculoskeletal: Negative for falls and myalgias.  Skin: Negative for rash.  Neurological: Positive for weakness. Negative for dizziness, tingling, tremors, sensory change, speech change,  focal weakness and loss of consciousness.  Endo/Heme/Allergies: Does not bruise/bleed easily.  Psychiatric/Behavioral: Negative for substance abuse. The patient is not nervous/anxious.   All other systems reviewed and are negative.   Labs:  Recent Labs  08/21/16 1325 08/21/16 2112 08/22/16 0125 08/22/16 0502  TROPONINI 0.03* 0.04* 0.05* 0.06*   Lab Results  Component Value Date   WBC 13.1 (H) 08/21/2016   HGB 10.6 (L) 08/21/2016   HCT 30.5 (L) 08/21/2016   MCV 91.9 08/21/2016   PLT 254 08/21/2016     Recent Labs Lab 08/22/16 0125  NA 130*  K 3.9  CL 101  CO2 22  BUN 33*  CREATININE 1.60*  CALCIUM 8.4*  GLUCOSE 90   Lab Results  Component Value Date   CHOL 118 05/06/2016   HDL 46 05/06/2016   LDLCALC 54 05/06/2016   TRIG 89 05/06/2016   No results found for: DDIMER  Radiology/Studies:  Dg Chest 2 View  Result Date: 08/21/2016 IMPRESSION: Chronic bronchitic changes, stable. No CHF. Persistent bibasilar densities greatest on the left which may reflect scarring but superimposed pneumonia is not excluded. Followup PA and lateral chest X-ray is recommended in 3-4 weeks following trial of antibiotic therapy to ensure resolution and exclude underlying malignancy. Thoracic aortic atherosclerosis. Electronically Signed   By: David  Martinique M.D.   On: 08/21/2016 14:17   Nm Pulmonary Perf And Vent  Result Date: 08/22/2016 IMPRESSION: Low probability for pulmonary embolus. Very poor ventilation suggesting COPD. Electronically Signed   By: Marcello Moores  Register   On: 08/22/2016 11:59   US Venous Img Lower Bilateral  Result Date: 08/21/2016 IMPRESSION: No evidence of DVT within either lower extremity. Evaluation of left peroneal vein limited by edema  and bandage. Electronically Signed   By: Genia Del M.D.   On: 08/21/2016 17:18    EKG: Interpreted by me showed: 6/26: NSR, 85 bpm, no acute st/t changes. Repeat EKG this  Morning: Afib with RVR, 130 bpm, nonspecific st/t changes Telemetry: Interpreted by me showed: Afib with RVR at 6:10 AM this morning with heart rates in the 130s to 170s bpm, converting to NSR at 8:51 AM with heart rates in the 70s bpm  Weights: Filed Weights   08/21/16 1324 08/21/16 2117  Weight: 114 lb (51.7 kg) 123 lb 4.8 oz (55.9 kg)     Physical Exam: Blood pressure 114/66, pulse (!) 126, temperature 98.1 F (36.7 C), temperature source Oral, resp. rate 18, height 4\' 11"  (1.499 m), weight 123 lb 4.8 oz (55.9 kg), SpO2 98 %. Body mass index is 24.9 kg/m. General: Well developed, well nourished, in no acute distress. Head: Normocephalic, atraumatic, sclera non-icteric, no xanthomas, nares are without discharge.  Neck: Negative for carotid bruits. JVD elevated ~ 10 cm. Lungs: Diminished breath sounds bilaterally more so L>R. Breathing is unlabored. Heart: RRR with S1 S2. II/VI systolic murmur, no rubs, or gallops appreciated. Abdomen: Soft, non-tender, non-distended with normoactive bowel sounds. No hepatomegaly. No rebound/guarding. No obvious abdominal masses. Msk:  Strength and tone appear normal for age. Extremities: No clubbing or cyanosis. 1+ bilateral nonpitting edema. Distal pedal pulses are 2+ and equal bilaterally. Neuro: Alert and oriented X 3. No facial asymmetry. No focal deficit. Moves all extremities spontaneously. Psych:  Responds to questions appropriately with a normal affect.    Assessment and Plan:  Principal Problem:   Acute respiratory distress Active Problems:   PNA (pneumonia)   New onset atrial fibrillation (HCC)   CKD (chronic kidney disease), stage III  Acute on chronic diastolic CHF (congestive heart failure) (HCC)   Chest pain    1. Acute respiratory distress: -Likely  multifactorial including bronchitis/pneumonia/possible component of mild acute on chronic diastolic CHF -On room air  2. New onset Afib with RVR: -Presented to [facility] in normal sinus rhythm as documented by admission EKG. Patient converted to new onset Afib with RVR on the morning of 6/28 at 6:10 AM. She was initially treated with amiodarone bolus which slowed her rate to 120s to 130s bpm the remained in A. fib. She was started on by mouth diltiazem. She spontaneously converted to normal sinus rhythm at 8:51 AM this morning. She has remained in sinus rhythm since -Will increase carvedilol to 6.25 mg twice a day -If needed could use long-acting Cardizem, though she prefers to avoid this given her prior history of lower extremity swelling on calcium channel blocker -Her CHADS2VASc is at least 6 (CHF, HTN, age x 2, vascular disease, female) -Given patient's elevated stroke risk as above we will start her on Eliquis 2.5 mg twice a day -It is noted patient is on Plavix given her recent renal artery stenting in March 2018. Cardiology recommends internal medicine to discuss possibly discontinuing her Plavix with vascular surgery in an effort to avoid dual therapy -Check potassium, magnesium, thyroid function, and CBC -Recommend repletion of all electrolytes to goal -Stop aspirin at this time an effort to avoid triple therapy -Recommend tapering steroids when able given her Afib  3. Acute on chronic diastolic CHF: -BNP elevated at 1,200 -Decreased breath sounds along left base along with right basilar crackles, mildly elevated JVD, and lower 70 swelling -Will give IV Lasix for today -Recent echo as above, no need to repeat at this time -Recheck chest x-ray today  4. Pneumonia: -Per IM -Recommended changing albuterol to Xopenex, defer to IM  5. CKD stage III: -Renal function at baseline is approximately 2, with recent serum creatinine from 6/27 1.6 possibly indicating patient is -Monitor with  diuresis   Signed, Christell Faith, PA-C [place] Pager: [phone] 08/23/2016, 10:06 AM

## 2016-08-24 DIAGNOSIS — I4891 Unspecified atrial fibrillation: Secondary | ICD-10-CM | POA: Diagnosis present

## 2016-08-24 DIAGNOSIS — I10 Essential (primary) hypertension: Secondary | ICD-10-CM | POA: Diagnosis not present

## 2016-08-24 DIAGNOSIS — Z90710 Acquired absence of both cervix and uterus: Secondary | ICD-10-CM | POA: Diagnosis not present

## 2016-08-24 DIAGNOSIS — K219 Gastro-esophageal reflux disease without esophagitis: Secondary | ICD-10-CM | POA: Diagnosis present

## 2016-08-24 DIAGNOSIS — Z8042 Family history of malignant neoplasm of prostate: Secondary | ICD-10-CM | POA: Diagnosis not present

## 2016-08-24 DIAGNOSIS — Z96653 Presence of artificial knee joint, bilateral: Secondary | ICD-10-CM | POA: Diagnosis present

## 2016-08-24 DIAGNOSIS — R079 Chest pain, unspecified: Secondary | ICD-10-CM | POA: Diagnosis not present

## 2016-08-24 DIAGNOSIS — J189 Pneumonia, unspecified organism: Secondary | ICD-10-CM | POA: Diagnosis present

## 2016-08-24 DIAGNOSIS — M1990 Unspecified osteoarthritis, unspecified site: Secondary | ICD-10-CM | POA: Diagnosis present

## 2016-08-24 DIAGNOSIS — Z885 Allergy status to narcotic agent status: Secondary | ICD-10-CM | POA: Diagnosis not present

## 2016-08-24 DIAGNOSIS — Z9889 Other specified postprocedural states: Secondary | ICD-10-CM | POA: Diagnosis not present

## 2016-08-24 DIAGNOSIS — Z888 Allergy status to other drugs, medicaments and biological substances status: Secondary | ICD-10-CM | POA: Diagnosis not present

## 2016-08-24 DIAGNOSIS — Z800 Family history of malignant neoplasm of digestive organs: Secondary | ICD-10-CM | POA: Diagnosis not present

## 2016-08-24 DIAGNOSIS — M109 Gout, unspecified: Secondary | ICD-10-CM | POA: Diagnosis present

## 2016-08-24 DIAGNOSIS — R0781 Pleurodynia: Secondary | ICD-10-CM | POA: Diagnosis not present

## 2016-08-24 DIAGNOSIS — Z85820 Personal history of malignant melanoma of skin: Secondary | ICD-10-CM | POA: Diagnosis not present

## 2016-08-24 DIAGNOSIS — N189 Chronic kidney disease, unspecified: Secondary | ICD-10-CM | POA: Diagnosis not present

## 2016-08-24 DIAGNOSIS — I5033 Acute on chronic diastolic (congestive) heart failure: Secondary | ICD-10-CM | POA: Diagnosis present

## 2016-08-24 DIAGNOSIS — Z8249 Family history of ischemic heart disease and other diseases of the circulatory system: Secondary | ICD-10-CM | POA: Diagnosis not present

## 2016-08-24 DIAGNOSIS — R001 Bradycardia, unspecified: Secondary | ICD-10-CM | POA: Diagnosis present

## 2016-08-24 DIAGNOSIS — E785 Hyperlipidemia, unspecified: Secondary | ICD-10-CM | POA: Diagnosis present

## 2016-08-24 DIAGNOSIS — R0603 Acute respiratory distress: Secondary | ICD-10-CM | POA: Diagnosis not present

## 2016-08-24 DIAGNOSIS — Z801 Family history of malignant neoplasm of trachea, bronchus and lung: Secondary | ICD-10-CM | POA: Diagnosis not present

## 2016-08-24 DIAGNOSIS — N179 Acute kidney failure, unspecified: Secondary | ICD-10-CM | POA: Diagnosis present

## 2016-08-24 DIAGNOSIS — Z9049 Acquired absence of other specified parts of digestive tract: Secondary | ICD-10-CM | POA: Diagnosis not present

## 2016-08-24 DIAGNOSIS — Z8041 Family history of malignant neoplasm of ovary: Secondary | ICD-10-CM | POA: Diagnosis not present

## 2016-08-24 DIAGNOSIS — E871 Hypo-osmolality and hyponatremia: Secondary | ICD-10-CM | POA: Diagnosis present

## 2016-08-24 DIAGNOSIS — N184 Chronic kidney disease, stage 4 (severe): Secondary | ICD-10-CM | POA: Diagnosis present

## 2016-08-24 DIAGNOSIS — I130 Hypertensive heart and chronic kidney disease with heart failure and stage 1 through stage 4 chronic kidney disease, or unspecified chronic kidney disease: Secondary | ICD-10-CM | POA: Diagnosis present

## 2016-08-24 DIAGNOSIS — Z7951 Long term (current) use of inhaled steroids: Secondary | ICD-10-CM | POA: Diagnosis not present

## 2016-08-24 LAB — HEPATIC FUNCTION PANEL
ALBUMIN: 3.1 g/dL — AB (ref 3.5–5.0)
ALK PHOS: 79 U/L (ref 38–126)
ALT: 17 U/L (ref 14–54)
AST: 24 U/L (ref 15–41)
BILIRUBIN TOTAL: 0.5 mg/dL (ref 0.3–1.2)
Bilirubin, Direct: <0.1 mg/dL — ABNORMAL LOW (ref 0.1–0.5)
Total Protein: 6.6 g/dL (ref 6.5–8.1)

## 2016-08-24 LAB — T4, FREE: Free T4: 0.98 ng/dL (ref 0.61–1.12)

## 2016-08-24 LAB — BASIC METABOLIC PANEL
Anion gap: 9 (ref 5–15)
BUN: 40 mg/dL — ABNORMAL HIGH (ref 6–20)
CALCIUM: 9 mg/dL (ref 8.9–10.3)
CHLORIDE: 102 mmol/L (ref 101–111)
CO2: 26 mmol/L (ref 22–32)
CREATININE: 1.78 mg/dL — AB (ref 0.44–1.00)
GFR calc Af Amer: 28 mL/min — ABNORMAL LOW (ref >60)
GFR calc non Af Amer: 24 mL/min — ABNORMAL LOW (ref >60)
GLUCOSE: 106 mg/dL — AB (ref 65–99)
Potassium: 3.6 mmol/L (ref 3.5–5.1)
Sodium: 137 mmol/L (ref 135–145)

## 2016-08-24 MED ORDER — AMIODARONE HCL IN DEXTROSE 360-4.14 MG/200ML-% IV SOLN
30.0000 mg/h | INTRAVENOUS | Status: DC
  Administered 2016-08-25 (×2): 30 mg/h via INTRAVENOUS
  Filled 2016-08-24: qty 200

## 2016-08-24 MED ORDER — HYDRALAZINE HCL 25 MG PO TABS
25.0000 mg | ORAL_TABLET | Freq: Three times a day (TID) | ORAL | Status: DC
  Administered 2016-08-24 – 2016-08-27 (×9): 25 mg via ORAL
  Filled 2016-08-24 (×9): qty 1

## 2016-08-24 MED ORDER — CARVEDILOL 6.25 MG PO TABS
6.2500 mg | ORAL_TABLET | Freq: Two times a day (BID) | ORAL | Status: DC
  Administered 2016-08-24: 6.25 mg via ORAL
  Filled 2016-08-24: qty 1

## 2016-08-24 MED ORDER — DILTIAZEM HCL 60 MG PO TABS
60.0000 mg | ORAL_TABLET | Freq: Three times a day (TID) | ORAL | Status: DC
  Administered 2016-08-24 – 2016-08-25 (×4): 60 mg via ORAL
  Filled 2016-08-24 (×3): qty 1
  Filled 2016-08-24 (×2): qty 2
  Filled 2016-08-24 (×2): qty 1
  Filled 2016-08-24: qty 2
  Filled 2016-08-24: qty 1
  Filled 2016-08-24: qty 2

## 2016-08-24 MED ORDER — MAGNESIUM OXIDE 400 (241.3 MG) MG PO TABS
400.0000 mg | ORAL_TABLET | Freq: Every day | ORAL | Status: DC
  Administered 2016-08-24 – 2016-08-30 (×7): 400 mg via ORAL
  Filled 2016-08-24 (×8): qty 1

## 2016-08-24 MED ORDER — POTASSIUM CHLORIDE CRYS ER 20 MEQ PO TBCR
40.0000 meq | EXTENDED_RELEASE_TABLET | Freq: Once | ORAL | Status: AC
  Administered 2016-08-24: 40 meq via ORAL
  Filled 2016-08-24: qty 2

## 2016-08-24 MED ORDER — CARVEDILOL 3.125 MG PO TABS
3.1250 mg | ORAL_TABLET | Freq: Two times a day (BID) | ORAL | Status: DC
  Administered 2016-08-24 – 2016-08-30 (×12): 3.125 mg via ORAL
  Filled 2016-08-24 (×12): qty 1

## 2016-08-24 MED ORDER — AMIODARONE LOAD VIA INFUSION
150.0000 mg | Freq: Once | INTRAVENOUS | Status: AC
  Administered 2016-08-24: 150 mg via INTRAVENOUS
  Filled 2016-08-24: qty 83.34

## 2016-08-24 MED ORDER — AMIODARONE LOAD VIA INFUSION
150.0000 mg | Freq: Once | INTRAVENOUS | Status: DC
  Filled 2016-08-24: qty 83.34

## 2016-08-24 MED ORDER — AMIODARONE HCL IN DEXTROSE 360-4.14 MG/200ML-% IV SOLN
60.0000 mg/h | INTRAVENOUS | Status: AC
Start: 2016-08-24 — End: 2016-08-24
  Administered 2016-08-24 (×2): 60 mg/h via INTRAVENOUS
  Filled 2016-08-24 (×2): qty 200

## 2016-08-24 MED ORDER — FUROSEMIDE 40 MG PO TABS
40.0000 mg | ORAL_TABLET | Freq: Every day | ORAL | Status: DC
  Administered 2016-08-24 – 2016-08-25 (×2): 40 mg via ORAL
  Filled 2016-08-24 (×2): qty 1

## 2016-08-24 MED ORDER — AMIODARONE IV BOLUS ONLY 150 MG/100ML
150.0000 mg | Freq: Once | INTRAVENOUS | Status: AC
  Administered 2016-08-24: 150 mg via INTRAVENOUS
  Filled 2016-08-24: qty 100

## 2016-08-24 NOTE — Progress Notes (Addendum)
    Upon rounding on the patient this morning she remained in NSR with heart rates in the 70s bpm with frequent PACs. At approximately 8:50 AM she went back into Afib with RVR with heart rates in the 160s bpm. She has just received Coreg 6.25 mg and KCl 40 mEq. She notes palpitations and increased SOB. Recheck BP noted to be 158/70. Add-on thyroid studies are pending. Will give short-acting diltiazem 60 mg x 1 this morning followed by repeat dose in 8 hours as needed for HR > 100 bpm. If needed for rate control in the setting of soft BP could use digoxin though would try to avoid this at this time given her advanced age. She has not been adequately anticoagulated (just started Eliquis on the morning of 6/28). Discussed with Dr. Fletcher Anon, will start amiodarone infusion in an effort to maintain sinus rhythm. She has previously had issues with bradycardia.

## 2016-08-24 NOTE — Care Management (Addendum)
RNCM noted that patient is starting Eliquis. Cost to patient at [facility] will be $13. Daughter Vaughan Basta updated and agrees. RNCM will continue to follow. Per Christell Faith PA-C note today patient will start Amio gtt- only one time dose.

## 2016-08-24 NOTE — Progress Notes (Signed)
Patient Name: Joanne Michael Date of Encounter: 08/24/2016  Primary Cardiologist: [facility] Problem List     Principal Problem:   Acute respiratory distress Active Problems:   PNA (pneumonia)   New onset atrial fibrillation (HCC)   CKD (chronic kidney disease), stage III   Acute on chronic diastolic CHF (congestive heart failure) (HCC)   Chest pain     Subjective   Continues to improve. No SOB or chest pain. Ambulated in the halls without issue. Has remained in sinus rhythm with frequent PACs. Potassium 3.6 this morning. Magnesium 1.9. TSH elevated.   Inpatient Medications    Scheduled Meds: . allopurinol  100 mg Oral BID  . apixaban  2.5 mg Oral BID  . atorvastatin  20 mg Oral QHS  . calcium-vitamin D  1 tablet Oral Q breakfast  . cloNIDine  0.1 mg Oral TID  . clopidogrel  75 mg Oral Daily  . collagenase  1 application Topical Daily  . ferrous sulfate  325 mg Oral Q breakfast  . furosemide  40 mg Intravenous Q12H  . hydrALAZINE  25 mg Oral BID  . levalbuterol  0.63 mg Nebulization Q8H  . loratadine  10 mg Oral Daily  . multivitamin with minerals  1 tablet Oral Daily  . pantoprazole  40 mg Oral QHS  . predniSONE  10 mg Oral Daily   Continuous Infusions: . azithromycin Stopped (08/23/16 1010)  . cefTRIAXone (ROCEPHIN)  IV Stopped (08/23/16 0948)   PRN Meds: acetaminophen **OR** acetaminophen, diltiazem, fluticasone, nitroGLYCERIN, ondansetron **OR** ondansetron (ZOFRAN) IV   Vital Signs    Vitals:   08/23/16 1949 08/24/16 0426 08/24/16 0734 08/24/16 0734  BP: (!) 155/53 (!) 158/60 (!) 178/50   Pulse: 79 76 65 (!) 53  Resp: 16 16 16    Temp: 97.9 F (36.6 C) 98.2 F (36.8 C) 98 F (36.7 C)   TempSrc: Oral  Oral   SpO2: 97% 96% 95% 96%  Weight:      Height:        Intake/Output Summary (Last 24 hours) at 08/24/16 0748 Last data filed at 08/23/16 2112  Gross per 24 hour  Intake                0 ml  Output              201 ml  Net              -201 ml   Filed Weights   08/21/16 1324 08/21/16 2117  Weight: 114 lb (51.7 kg) 123 lb 4.8 oz (55.9 kg)    Physical Exam    GEN: Well nourished, well developed, in no acute distress.  HEENT: Grossly normal.  Neck: Supple, no JVD, carotid bruits, or masses. Cardiac: RRR, II/VI systolic murmur, no rubs, or gallops. No clubbing, cyanosis, edema.  Radials/DP/PT 2+ and equal bilaterally.  Respiratory:  Much improved breath sounds bilaterally. GI: Soft, nontender, nondistended, BS + x 4. MS: no deformity or atrophy. Skin: warm and dry, no rash. Neuro:  Strength and sensation are intact. Psych: AAOx3.  Normal affect.  Labs    CBC  Recent Labs  08/21/16 1325 08/23/16 1038  WBC 13.1* 15.8*  HGB 10.6* 10.0*  HCT 30.5* 29.4*  MCV 91.9 91.8  PLT 254 885   Basic Metabolic Panel  Recent Labs  08/23/16 1038 08/24/16 0540  NA 135 137  K 3.8 3.6  CL 104 102  CO2 21* 26  GLUCOSE 178*  106*  BUN 36* 40*  CREATININE 1.84* 1.78*  CALCIUM 8.9 9.0  MG 1.9  --    Liver Function Tests No results for input(s): AST, ALT, ALKPHOS, BILITOT, PROT, ALBUMIN in the last 72 hours. No results for input(s): LIPASE, AMYLASE in the last 72 hours. Cardiac Enzymes  Recent Labs  08/21/16 2112 08/22/16 0125 08/22/16 0502  TROPONINI 0.04* 0.05* 0.06*   BNP Invalid input(s): POCBNP D-Dimer No results for input(s): DDIMER in the last 72 hours. Hemoglobin A1C No results for input(s): HGBA1C in the last 72 hours. Fasting Lipid Panel No results for input(s): CHOL, HDL, LDLCALC, TRIG, CHOLHDL, LDLDIRECT in the last 72 hours. Thyroid Function Tests  Recent Labs  08/23/16 1038  TSH 6.375*    Telemetry    NSR, 70s bpm, short run of atrial tach, frequent PACs - Personally Reviewed  ECG    n/a - Personally Reviewed  Radiology    Nm Pulmonary Perf And Vent  Result Date: 08/22/2016 IMPRESSION: Low probability for pulmonary embolus. Very poor ventilation suggesting COPD.  Electronically Signed   By: Marcello Moores  Register   On: 08/22/2016 11:59   Dg Chest Port 1 View  Result Date: 08/23/2016 IMPRESSION: Persistent opacity at the left lung base consistent with effusion, atelectasis and possibly pneumonia. However in view of the persistence of this opacity, CT of the chest is recommended to exclude underlying neoplasm. Electronically Signed   By: Ivar Drape M.D.   On: 08/23/2016 10:39    Cardiac Studies   TTE 07/31/2016: Study Conclusions  - Left ventricle: The cavity size was normal. Wall thickness was   increased in a pattern of moderate LVH. Systolic function was   vigorous. The estimated ejection fraction was in the range of 65%   to 70%. Wall motion was normal; there were no regional wall   motion abnormalities. Doppler parameters are consistent with   abnormal left ventricular relaxation (grade 1 diastolic   dysfunction). - Aortic valve: Probably trileaflet; moderately thickened leaflets.   Cusp separation was reduced. Transvalvular velocity was   increased. There was mild to moderate stenosis. There was mild to   moderate regurgitation. Valve area (VTI): 1.22 cm^2. Valve area   (Vmean): 1.02 cm^2. - Mitral valve: Mildly thickened leaflets . There was mild   regurgitation. - Right ventricle: The cavity size was normal. Wall thickness was   normal. Systolic function was normal.  Patient Profile     83 y.o. female with history of mild to moderate aortic stenosis by recent echocardiogram in early June 2018, CKD stage III, prior episode of near syncope in the setting of dehydration, mild carotid arterial disease, renal artery stenosis status post renal artery stenting in 04/2016 on Plavix at home, chronic lower extremity swelling that has previously been exacerbated by amlodipine, previously poorly controlled hypertension now improved status post renal artery stenting, hyperlipidemia, and GERD who presented to [facility] with one-day history of pleuritic chest pain.  During her admission she developed new onset Afib with RVR on 6/28 from 6:10-8:51 AM. She has remained in NSR since with short run of atrial tach and frequent PACs.  Assessment & Plan    1. Acute respiratory distress: -Likely multifactorial including bronchitis/pneumonia/possible component of mild acute on chronic diastolic CHF -On room air  2. New onset Afib with RVR: -Presented to [facility] in normal sinus rhythm as documented by admission EKG. Patient converted to new onset Afib with RVR on the morning of 6/28 at 6:10 AM. She was initially treated  with amiodarone bolus which slowed her rate to 120s to 130s bpm the remained in A. fib. She was started on by mouth diltiazem. She spontaneously converted to normal sinus rhythm at 8:51 AM this morning. She has remained in sinus rhythm since with frequent PACs -Add back carvedilol to 6.25 mg twice a day, titrate as needed -If needed could use long-acting Cardizem, though she prefers to avoid this given her prior history of lower extremity swelling on calcium channel blocker -Her CHADS2VASc is at least 6 (CHF, HTN, age x 2, vascular disease, female) -Given patient's elevated stroke risk as above she has neem started on Eliquis 2.5 mg twice a day -It is noted patient is on Plavix given her recent renal artery stenting in March 2018. Cardiology recommends internal medicine to discuss possibly discontinuing her Plavix with vascular surgery in an effort to avoid dual therapy -Replete potassium as below -Start magnesium oxide 400 mg daily -Aspirin stopped in an effort to avoid triple therapy -Recommend tapering steroids when able given her Afib  3. Acute on chronic diastolic CHF: -Much improved -BNP elevated at 1,200 -Transition IV Lasix to PO Lasix 40 mg daily -Recent echo as above, no need to repeat at this time -Recheck chest x-ray on 6/28 with persistent left effusion, radiology recommends CT chest to evaluate for malignancy, defer to IM  4.  Pneumonia: -Per IM -Recommended changing albuterol to Xopenex, defer to IM  5. CKD stage III: -Renal function at baseline is approximately 2, with recent serum creatinine from 6/27 1.6 possibly indicating patient is -Monitor with diuresis  6. Hypokalemia: -Replete to goal of 4.0  7. Elevated TSH: -Recommend free T4/T3  8. Accelerated HTN: -Added back Coreg as above -Increase hydralazine to 25 mg tid   Gladstone Lighter [place] Pager: [phone] 08/24/2016, 7:48 AM

## 2016-08-24 NOTE — Progress Notes (Signed)
[facility] [place] NAME: Joanne Michael    MR#:  [id number]  DATE OF BIRTH:  March 16, 1924  converetd to afib with am again ,HR in 160s.received bolus amiodarone.pt  Was symptomatic that time.  CHIEF COMPLAINT:   Chief Complaint  Patient presents with  . Chest Pain    REVIEW OF SYSTEMS:   ROS CONSTITUTIONAL: No fever, fatigue or weakness.  EYES: No blurred or double vision.  EARS, NOSE, AND THROAT: No tinnitus or ear pain.  RESPIRATORY: cough And shortness of breath.  CARDIOVASCULAR: palpitations today. GASTROINTESTINAL: No nausea, vomiting, diarrhea or abdominal pain.  GENITOURINARY: No dysuria, hematuria.  ENDOCRINE: No polyuria, nocturia,  HEMATOLOGY: No anemia, easy bruising or bleeding SKIN: No rash or lesion. MUSCULOSKELETAL: No joint pain or arthritis.   NEUROLOGIC: No tingling, numbness, weakness.  PSYCHIATRY: No anxiety or depression.   DRUG ALLERGIES:   Allergies  Allergen Reactions  . Oxycodone Itching  . Codeine Nausea And Vomiting and Itching  . Hydrocodone-Acetaminophen Other (See Comments)    Hallucinates  . Iodine Swelling, Other (See Comments), Itching and Nausea Only    Patient is allergic to seafood and this is the cause why she can't take medication.  . Meperidine Hcl     Unknown reactions.  . Other Swelling    Seafood- tongue swelling and discoloration Patient allergic to seafood, tongue swelling and discoloration.  . Propoxyphene N-Acetaminophen Other (See Comments)    Unknown reaction    VITALS:  Blood pressure (!) 149/64, pulse 77, temperature 98.4 F (36.9 C), temperature source Oral, resp. rate 16, height 4\' 11"  (1.499 m), weight 55.9 kg (123 lb 4.8 oz), SpO2 95 %.  PHYSICAL EXAMINATION:  GENERAL:  83 y.o.-year-old patient lying in the bed with no acute distress.  EYES: Pupils equal, round, reactive to light and accommodation. No scleral icterus. Extraocular muscles intact.  HEENT:  Head atraumatic, normocephalic. Oropharynx and nasopharynx clear.  NECK:  Supple, no jugular venous distention. No thyroid enlargement, no tenderness.  LUNGS: decreased breath sounds in the left base.  CARDIOVASCULAR: S1, S2 normal. No murmurs, rubs, or gallops.  ABDOMEN: Soft, nontender, nondistended. Bowel sounds present. No organomegaly or mass.  EXTREMITIES: No pedal edema, cyanosis, or clubbing.  NEUROLOGIC: Cranial nerves II through XII are intact. Muscle strength 5/5 in all extremities. Sensation intact. Gait not checked.  PSYCHIATRIC: The patient is alert and oriented x 3.  SKIN: No obvious rash, lesion, or ulcer.    LABORATORY PANEL:   CBC  Recent Labs Lab 08/23/16 1038  WBC 15.8*  HGB 10.0*  HCT 29.4*  PLT 249   ------------------------------------------------------------------------------------------------------------------  Chemistries   Recent Labs Lab 08/23/16 1038 08/24/16 0540  NA 135 137  K 3.8 3.6  CL 104 102  CO2 21* 26  GLUCOSE 178* 106*  BUN 36* 40*  CREATININE 1.84* 1.78*  CALCIUM 8.9 9.0  MG 1.9  --   AST  --  24  ALT  --  17  ALKPHOS  --  79  BILITOT  --  0.5   ------------------------------------------------------------------------------------------------------------------  Cardiac Enzymes  Recent Labs Lab 08/22/16 0502  TROPONINI 0.06*   ------------------------------------------------------------------------------------------------------------------  RADIOLOGY:  Dg Chest Port 1 View  Result Date: 08/23/2016 CLINICAL DATA:  New onset of atrial fibrillation, hypertension EXAM: PORTABLE CHEST 1 VIEW COMPARISON:  Chest x-ray of 08/21/2016 hand 05/05/2016 FINDINGS: There is persistent opacity at the left lung base with apparent left pleural effusion. In view of the persistent this  opacity, CT of the chest is recommended to exclude underlying neoplasm. The right lung is clear. Mediastinal and hilar contours are unremarkable. The heart  is within upper limits of normal and stable. No bony abnormality is seen IMPRESSION: Persistent opacity at the left lung base consistent with effusion, atelectasis and possibly pneumonia. However in view of the persistence of this opacity, CT of the chest is recommended to exclude underlying neoplasm. Electronically Signed   By: Ivar Drape M.D.   On: 08/23/2016 10:39    EKG:   Orders placed or performed during the hospital encounter of 08/21/16  . EKG 12-Lead  . EKG 12-Lead  . ED EKG within 10 minutes  . ED EKG within 10 minutes  . EKG 12-Lead  . EKG 12-Lead    ASSESSMENT AND PLAN:   Chest pain, shortness of breath likely secondary to reactive airway disease And left lower lobe pneumonia. Continue IV Rocephin, Zithromax,changed nebs to xopenox. , #2/chronic kidney disease status remains stable  #3 .  Acute on chronic diastolic heart failure with elevated BNP. Continue IV Lasix, seen by cardiology. Echocardiogram shows EF more than 55%.    4. Essential hypertension, bradycardia. Episode 2 days ago.now has paroxysmal afib with RVR  #5 new onset A. fib: Likely due to respiratory distress, on eliquis,cardio following. .received amio pt may benefit  From amiodarone drip, 6. History of renal artery  Stent  3 months ago, on Plavix. Will discuss with vascular to see if we can  Stop  Plavix and continue eliquis  only.  D/w family.,RN   All the records are reviewed and case discussed with Care Management/Social Workerr. Management plans discussed with the patient, family and they are in agreement.  CODE STATUS:full  TOTAL TIME TAKING CARE OF THIS PATIENT: 35 minutes.   POSSIBLE D/C IN 1-2DAYS, DEPENDING ON CLINICAL CONDITION.   Epifanio Lesches M.D on 08/24/2016 at 11:26 PM  Between 7am to 6pm - Pager - [phone]  After 6pm go to [email or web address] - password [email or web address] [facility]  Office  [phone]  CC: Primary care physician; Dion Body,  MD   Note: This dictation was prepared with Dragon dictation along with smaller phrase technology. Any transcriptional errors that result from this process are unintentional.

## 2016-08-25 LAB — T3: T3, Total: 57 ng/dL — ABNORMAL LOW (ref 71–180)

## 2016-08-25 MED ORDER — AMIODARONE HCL 200 MG PO TABS
400.0000 mg | ORAL_TABLET | Freq: Two times a day (BID) | ORAL | Status: DC
  Administered 2016-08-25 – 2016-08-29 (×9): 400 mg via ORAL
  Filled 2016-08-25 (×9): qty 2

## 2016-08-25 NOTE — Progress Notes (Addendum)
Progress Note  Patient Name: Joanne Michael Date of Encounter: 08/25/2016  Primary Cardiologist: TG  Primary Electrophysiologist: new   Patient Profile    83 y.o. female with history of mild to moderate aortic stenosis by recent echocardiogram in early June 2018, CKDstage III, prior episode of near syncope in the setting of dehydration, mild carotid arterial disease, renal artery stenosis status post renal artery stenting in 04/2016 on Plavix at home, chronic lower extremity swelling that has previously been exacerbated by amlodipine, previously poorly controlled hypertension now improved status post renal artery stenting, hyperlipidemia, and GERD  Admitted to [facility] with one-day history of pleuritic chest pain felt consistent with pneumonia and possible Diastolic HF   She has had recurrent afib While in hospital; she denies ever having had prior similar tachypalpitations  Thromboembolic risk factors ( age -84, HTN-1, Vasc disease -1, Gender-1) for a CHADSVASc Score of 5  Started on apixoban    Subjective  The patient denies chest pain, shortness of breath, nocturnal dyspnea, orthopnea or peripheral edema.  There have been no palpitations, lightheadedness or syncope.    No nausea on amiodarone  Inpatient Medications    Scheduled Meds: . allopurinol  100 mg Oral BID  . apixaban  2.5 mg Oral BID  . atorvastatin  20 mg Oral QHS  . calcium-vitamin D  1 tablet Oral Q breakfast  . carvedilol  3.125 mg Oral BID WC  . cloNIDine  0.1 mg Oral TID  . clopidogrel  75 mg Oral Daily  . collagenase  1 application Topical Daily  . diltiazem  60 mg Oral Q8H  . ferrous sulfate  325 mg Oral Q breakfast  . furosemide  40 mg Oral Daily  . hydrALAZINE  25 mg Oral Q8H  . levalbuterol  0.63 mg Nebulization Q8H  . loratadine  10 mg Oral Daily  . magnesium oxide  400 mg Oral Daily  . multivitamin with minerals  1 tablet Oral Daily  . pantoprazole  40 mg Oral QHS  . predniSONE  10 mg Oral Daily    Continuous Infusions: . amiodarone 30 mg/hr (08/25/16 0631)  . azithromycin Stopped (08/24/16 1015)  . cefTRIAXone (ROCEPHIN)  IV Stopped (08/24/16 1421)   PRN Meds: acetaminophen **OR** acetaminophen, diltiazem, fluticasone, nitroGLYCERIN, ondansetron **OR** ondansetron (ZOFRAN) IV   Vital Signs    Vitals:   08/24/16 2359 08/25/16 0042 08/25/16 0527 08/25/16 0730  BP:  (!) 152/43 (!) 152/60 (!) 169/50  Pulse:  68 74 73  Resp:  16 16 20   Temp:  97.9 F (36.6 C) 98.9 F (37.2 C)   TempSrc:  Oral    SpO2: 96% 97% 99% 94%  Weight:      Height:        Intake/Output Summary (Last 24 hours) at 08/25/16 0914 Last data filed at 08/25/16 0531  Gross per 24 hour  Intake           464.61 ml  Output             1500 ml  Net         -1035.39 ml   Filed Weights   08/21/16 1324 08/21/16 2117  Weight: 114 lb (51.7 kg) 123 lb 4.8 oz (55.9 kg)    Telemetry     No recurrent atrial fibrillation since yesterday afternoon - Personally Reviewed  ECG    3/18 sinus rhythm at 60. No conduction abnormalities - Personally Reviewed  Physical Exam   GEN: No acute distress.  Neck: JVDflat Cardiac: RRR, 3/6  Murmurs, with preserved S2 Respiratory: Clear to auscultation bilaterally. GI: Soft, nontender, non-distended  MS: no edema; No deformity. Neuro:  Nonfocal  Psych: Normal affect  Skin Warm and dry   Labs    Chemistry Recent Labs Lab 08/22/16 0125 08/23/16 1038 08/24/16 0540  NA 130* 135 137  K 3.9 3.8 3.6  CL 101 104 102  CO2 22 21* 26  GLUCOSE 90 178* 106*  BUN 33* 36* 40*  CREATININE 1.60* 1.84* 1.78*  CALCIUM 8.4* 8.9 9.0  PROT  --   --  6.6  ALBUMIN  --   --  3.1*  AST  --   --  24  ALT  --   --  17  ALKPHOS  --   --  79  BILITOT  --   --  0.5  GFRNONAA 27* 23* 24*  GFRAA 32* 27* 28*  ANIONGAP 7 10 9      Hematology Recent Labs Lab 08/21/16 1325 08/23/16 1038  WBC 13.1* 15.8*  RBC 3.32* 3.21*  HGB 10.6* 10.0*  HCT 30.5* 29.4*  MCV 91.9 91.8    MCH 32.1 31.1  MCHC 34.9 33.8  RDW 13.5 13.9  PLT 254 249    Cardiac Enzymes Recent Labs Lab 08/21/16 1325 08/21/16 2112 08/22/16 0125 08/22/16 0502  TROPONINI 0.03* 0.04* 0.05* 0.06*   No results for input(s): TROPIPOC in the last 168 hours.   BNP Recent Labs Lab 08/21/16 1523  BNP 1,207.0*     DDimer No results for input(s): DDIMER in the last 168 hours.   Radiology    Dg Chest Port 1 View  Result Date: 08/23/2016 CLINICAL DATA:  New onset of atrial fibrillation, hypertension EXAM: PORTABLE CHEST 1 VIEW COMPARISON:  Chest x-ray of 08/21/2016 hand 05/05/2016 FINDINGS: There is persistent opacity at the left lung base with apparent left pleural effusion. In view of the persistent this opacity, CT of the chest is recommended to exclude underlying neoplasm. The right lung is clear. Mediastinal and hilar contours are unremarkable. The heart is within upper limits of normal and stable. No bony abnormality is seen IMPRESSION: Persistent opacity at the left lung base consistent with effusion, atelectasis and possibly pneumonia. However in view of the persistence of this opacity, CT of the chest is recommended to exclude underlying neoplasm. Electronically Signed   By: Ivar Drape M.D.   On: 08/23/2016 10:39    Cardiac Studies    Echo 6/18  Mild AS  Mean gradient 17  LAE ( 39/2.5/25)      Assessment & Plan    Afib paroxysmal with RVR  Hypertensive heart disease  Pneumonia  Renal insufficiency  Renovascular hypertension with prior stenting  Anemia    Continue amio but will change to PO  Is there a way to simplify her medical regime for her BP???  Will stop dilt in this regard, as amio should hopefully be sufficient for rate control  Would give her another 24 hours in hospital given the frequent recurrences of atrial fibrillation on the oral amiodarone  As an outpatient, would recommend consideration of repeat renal artery assessment given the recurrent  hypertension following initial improvement after stenting Signed, Virl Axe, MD  08/25/2016, 9:14 AM

## 2016-08-25 NOTE — Progress Notes (Addendum)
[name] NAME: Joanne Michael    MR#:  [id number]  DATE OF BIRTH:  1924/03/05  Doing better,HR in 56s.on po Amiodarone,  CHIEF COMPLAINT:   Chief Complaint  Patient presents with  . Chest Pain    REVIEW OF SYSTEMS:   ROS CONSTITUTIONAL: No fever, fatigue or weakness.  EYES: No blurred or double vision.  EARS, NOSE, AND THROAT: No tinnitus or ear pain.  RESPIRATORY: cough And shortness of breath.  CARDIOVASCULAR: palpitations today. GASTROINTESTINAL: No nausea, vomiting, diarrhea or abdominal pain.  GENITOURINARY: No dysuria, hematuria.  ENDOCRINE: No polyuria, nocturia,  HEMATOLOGY: No anemia, easy bruising or bleeding SKIN: No rash or lesion. MUSCULOSKELETAL: No joint pain or arthritis.   NEUROLOGIC: No tingling, numbness, weakness.  PSYCHIATRY: No anxiety or depression.   DRUG ALLERGIES:   Allergies  Allergen Reactions  . Oxycodone Itching  . Codeine Nausea And Vomiting and Itching  . Hydrocodone-Acetaminophen Other (See Comments)    Hallucinates  . Iodine Swelling, Other (See Comments), Itching and Nausea Only    Patient is allergic to seafood and this is the cause why she can't take medication.  . Meperidine Hcl     Unknown reactions.  . Other Swelling    Seafood- tongue swelling and discoloration Patient allergic to seafood, tongue swelling and discoloration.  . Propoxyphene N-Acetaminophen Other (See Comments)    Unknown reaction    VITALS:  Blood pressure (!) 158/45, pulse 77, temperature 98.1 F (36.7 C), temperature source Oral, resp. rate 20, height 4\' 11"  (1.499 m), weight 55.9 kg (123 lb 4.8 oz), SpO2 99 %.  PHYSICAL EXAMINATION:  GENERAL:  83 y.o.-year-old patient lying in the bed with no acute distress.  EYES: Pupils equal, round, reactive to light and accommodation. No scleral icterus. Extraocular muscles intact.  HEENT: Head atraumatic, normocephalic. Oropharynx and nasopharynx  clear.  NECK:  Supple, no jugular venous distention. No thyroid enlargement, no tenderness.  LUNGS: decreased breath sounds in the left base.  CARDIOVASCULAR: S1, S2 regular. No murmurs, rubs, or gallops.  ABDOMEN: Soft, nontender, nondistended. Bowel sounds present. No organomegaly or mass.  EXTREMITIES: No pedal edema, cyanosis, or clubbing.  NEUROLOGIC: Cranial nerves II through XII are intact. Muscle strength 5/5 in all extremities. Sensation intact. Gait not checked.  PSYCHIATRIC: The patient is alert and oriented x 3.  SKIN: No obvious rash, lesion, or ulcer.    LABORATORY PANEL:   CBC  Recent Labs Lab 08/23/16 1038  WBC 15.8*  HGB 10.0*  HCT 29.4*  PLT 249   ------------------------------------------------------------------------------------------------------------------  Chemistries   Recent Labs Lab 08/23/16 1038 08/24/16 0540  NA 135 137  K 3.8 3.6  CL 104 102  CO2 21* 26  GLUCOSE 178* 106*  BUN 36* 40*  CREATININE 1.84* 1.78*  CALCIUM 8.9 9.0  MG 1.9  --   AST  --  24  ALT  --  17  ALKPHOS  --  79  BILITOT  --  0.5   ------------------------------------------------------------------------------------------------------------------  Cardiac Enzymes  Recent Labs Lab 08/22/16 0502  TROPONINI 0.06*   ------------------------------------------------------------------------------------------------------------------  RADIOLOGY:  No results found.  EKG:   Orders placed or performed during the hospital encounter of 08/21/16  . EKG 12-Lead  . EKG 12-Lead  . ED EKG within 10 minutes  . ED EKG within 10 minutes  . EKG 12-Lead  . EKG 12-Lead    ASSESSMENT AND PLAN:   Chest pain, shortness of breath likely secondary  to reactive airway disease And left lower lobe pneumonia. Continue IV Rocephin, Zithromax,changed nebs to xopenox.ct chest with contrast can not be done due to kidney disease and renal dysfunction. , #2/chronic kidney disease status  remains stable  #3 .  Acute on chronic diastolic heart failure with elevated BNP. Continue IV Lasix, seen by cardiology. Echocardiogram shows EF more than 55%.    4. Essential hypertension, bradycardia. Episode 2 days ago.now has paroxysmal afib with RVR  #5 new onset A. Fib:on amidarone PO,in sinus tday,continue and d/c cardizem as per cardio note,  6. History of renal artery  Stent  3 months ago, on Plavix D/w family.,RN Has ckd stage 3;stable Cr around 1.7.  All the records are reviewed and case discussed with Care Management/Social Workerr. Management plans discussed with the patient, family and they are in agreement.  CODE STATUS:full  TOTAL TIME TAKING CARE OF THIS PATIENT: 35 minutes.   POSSIBLE D/C IN 1-2DAYS, DEPENDING ON CLINICAL CONDITION.   Epifanio Lesches M.D on 08/25/2016 at 6:18 PM  Between 7am to 6pm - Pager - [phone]  After 6pm go to [email or web address] - password [email or web address] [facility]  Office  [phone]  CC: Primary care physician; Dion Body, MD   Note: This dictation was prepared with Dragon dictation along with smaller phrase technology. Any transcriptional errors that result from this process are unintentional.

## 2016-08-26 LAB — CREATININE, SERUM
Creatinine, Ser: 1.95 mg/dL — ABNORMAL HIGH (ref 0.44–1.00)
GFR calc Af Amer: 25 mL/min — ABNORMAL LOW (ref >60)
GFR, EST NON AFRICAN AMERICAN: 22 mL/min — AB (ref >60)

## 2016-08-26 LAB — CBC
HEMATOCRIT: 32.7 % — AB (ref 35.0–47.0)
HEMOGLOBIN: 11.3 g/dL — AB (ref 12.0–16.0)
MCH: 31.9 pg (ref 26.0–34.0)
MCHC: 34.5 g/dL (ref 32.0–36.0)
MCV: 92.6 fL (ref 80.0–100.0)
Platelets: 360 10*3/uL (ref 150–440)
RBC: 3.53 MIL/uL — AB (ref 3.80–5.20)
RDW: 13.9 % (ref 11.5–14.5)
WBC: 7.9 10*3/uL (ref 3.6–11.0)

## 2016-08-26 NOTE — Progress Notes (Signed)
[facility] at [place] NAME: Joanne Michael    MR#:  [id number]  DATE OF BIRTH:  [birth year]   doing well, no shortness of breath or chest pain. In sinus rhythm with heart rate 70 bpm.   CHIEF COMPLAINT:   Chief Complaint  Patient presents with  . Chest Pain    REVIEW OF SYSTEMS:   ROS CONSTITUTIONAL: No fever, fatigue or weakness.  EYES: No blurred or double vision.  EARS, NOSE, AND THROAT: No tinnitus or ear pain.  RESPIRATORY: cough And shortness of breath.  CARDIOVASCULAR: palpitations today. GASTROINTESTINAL: No nausea, vomiting, diarrhea or abdominal pain.  GENITOURINARY: No dysuria, hematuria.  ENDOCRINE: No polyuria, nocturia,  HEMATOLOGY: No anemia, easy bruising or bleeding SKIN: No rash or lesion. MUSCULOSKELETAL: No joint pain or arthritis.   NEUROLOGIC: No tingling, numbness, weakness.  PSYCHIATRY: No anxiety or depression.   DRUG ALLERGIES:   Allergies  Allergen Reactions  . Oxycodone Itching  . Codeine Nausea And Vomiting and Itching  . Hydrocodone-Acetaminophen Other (See Comments)    Hallucinates  . Iodine Swelling, Other (See Comments), Itching and Nausea Only    Patient is allergic to seafood and this is the cause why she can't take medication.  . Meperidine Hcl     Unknown reactions.  . Other Swelling    Seafood- tongue swelling and discoloration Patient allergic to seafood, tongue swelling and discoloration.  . Propoxyphene N-Acetaminophen Other (See Comments)    Unknown reaction    VITALS:  Blood pressure (!) 167/51, pulse 77, temperature 97.8 F (36.6 C), temperature source Oral, resp. rate 14, height 4\' 11"  (1.499 m), weight 55.9 kg (123 lb 4.8 oz), SpO2 97 %.  PHYSICAL EXAMINATION:  GENERAL:  83 y.o.-year-old patient lying in the bed with no acute distress.  EYES: Pupils equal, round, reactive to light and accommodation. No scleral icterus. Extraocular muscles intact.  HEENT: Head  atraumatic, normocephalic. Oropharynx and nasopharynx clear.  NECK:  Supple, no jugular venous distention. No thyroid enlargement, no tenderness.  LUNGS: decreased breath sounds in the left base.  CARDIOVASCULAR: S1, S2 regular. No murmurs, rubs, or gallops.  ABDOMEN: Soft, nontender, nondistended. Bowel sounds present. No organomegaly or mass.  EXTREMITIES: No pedal edema, cyanosis, or clubbing.  NEUROLOGIC: Cranial nerves II through XII are intact. Muscle strength 5/5 in all extremities. Sensation intact. Gait not checked.  PSYCHIATRIC: The patient is alert and oriented x 3.  SKIN: No obvious rash, lesion, or ulcer.    LABORATORY PANEL:   CBC  Recent Labs Lab 08/26/16 0357  WBC 7.9  HGB 11.3*  HCT 32.7*  PLT 360   ------------------------------------------------------------------------------------------------------------------  Chemistries   Recent Labs Lab 08/23/16 1038 08/24/16 0540 08/26/16 0357  NA 135 137  --   K 3.8 3.6  --   CL 104 102  --   CO2 21* 26  --   GLUCOSE 178* 106*  --   BUN 36* 40*  --   CREATININE 1.84* 1.78* 1.95*  CALCIUM 8.9 9.0  --   MG 1.9  --   --   AST  --  24  --   ALT  --  17  --   ALKPHOS  --  79  --   BILITOT  --  0.5  --    ------------------------------------------------------------------------------------------------------------------  Cardiac Enzymes  Recent Labs Lab 08/22/16 0502  TROPONINI 0.06*   ------------------------------------------------------------------------------------------------------------------  RADIOLOGY:  No results found.  EKG:   Orders placed or  performed during the hospital encounter of 08/21/16  . EKG 12-Lead  . EKG 12-Lead  . ED EKG within 10 minutes  . ED EKG within 10 minutes  . EKG 12-Lead  . EKG 12-Lead    ASSESSMENT AND PLAN:   Chest pain, shortness of breath likely secondary to reactive airway disease And left lower lobe pneumonia. Continue IV Rocephin, Zithromax,changed nebs  to xopenox.ct chest with contrast can not be done due to kidney disease and renal dysfunction.  , #2/chronic kidney disease status remains stable  #3 .  Acute on chronic diastolic heart failure with elevated BNP. Received  IV Lasix for 3 days, stopped  yesterday because of worsening kidney function. 4. Essential hypertension, bradycardia. Episode 2 days ago.now has paroxysmal afib with RVR  #5. new onset A. Fib: continue  Po amiodarone, continue eliquis  6. History of renal artery  Stent  3 months ago, on Plavix D/w family.,RN Has ckd stage 3;stable Cr around 1.7.  Walked around nurses station and HR stayed  Around 74.  All the records are reviewed and case discussed with Care Management/Social Workerr. Management plans discussed with the patient, family and they are in agreement.  CODE STATUS:full  TOTAL TIME TAKING CARE OF THIS PATIENT: 35 minutes.   POSSIBLE D/C IN 1-2DAYS, DEPENDING ON CLINICAL CONDITION.   Epifanio Lesches M.D on 08/26/2016 at 1:19 PM  Between 7am to 6pm - Pager - [phone]  After 6pm go to [email or web address] - password [email or web address] [facility]  Office  [phone]  CC: Primary care physician; Dion Body, MD   Note: This dictation was prepared with Dragon dictation along with smaller phrase technology. Any transcriptional errors that result from this process are unintentional.

## 2016-08-26 NOTE — Progress Notes (Signed)
Progress Note  Patient Name: Joanne Michael Date of Encounter: 08/26/2016  Primary Cardiologist: TG  Primary Electrophysiologist: new   Patient Profile    83 y.o. female with history of mild to moderate aortic stenosis by recent echocardiogram in early June 2018, CKDstage III, prior episode of near syncope in the setting of dehydration, mild carotid arterial disease, renal artery stenosis status post renal artery stenting in 04/2016 on Plavix at home, chronic lower extremity swelling that has previously been exacerbated by amlodipine, previously poorly controlled hypertension now improved status post renal artery stenting, hyperlipidemia, and GERD  Admitted to [facility] with one-day history of pleuritic chest pain felt consistent with pneumonia and possible Diastolic HF   She has had recurrent afib While in hospital; she denies ever having had prior similar tachypalpitations  Thromboembolic risk factors ( age -53, HTN-1, Vasc disease -1, Gender-1) for a CHADSVASc Score of 5  Started on apixoban    Subjective   This a.m. she is significantly more short of breath again with pleuritic chest pain not dissimilar from her presentation  she had a negative ventilation perfusion scan on admission;   this regard creatinine is continuing to climb  Inpatient Medications    Scheduled Meds: . allopurinol  100 mg Oral BID  . amiodarone  400 mg Oral BID  . apixaban  2.5 mg Oral BID  . atorvastatin  20 mg Oral QHS  . calcium-vitamin D  1 tablet Oral Q breakfast  . carvedilol  3.125 mg Oral BID WC  . cloNIDine  0.1 mg Oral TID  . clopidogrel  75 mg Oral Daily  . collagenase  1 application Topical Daily  . ferrous sulfate  325 mg Oral Q breakfast  . hydrALAZINE  25 mg Oral Q8H  . levalbuterol  0.63 mg Nebulization Q8H  . loratadine  10 mg Oral Daily  . magnesium oxide  400 mg Oral Daily  . multivitamin with minerals  1 tablet Oral Daily  . pantoprazole  40 mg Oral QHS  . predniSONE  10 mg Oral  Daily   Continuous Infusions: . azithromycin Stopped (08/26/16 1115)  . cefTRIAXone (ROCEPHIN)  IV Stopped (08/26/16 1045)   PRN Meds: acetaminophen **OR** acetaminophen, fluticasone, nitroGLYCERIN, ondansetron **OR** ondansetron (ZOFRAN) IV   Vital Signs    Vitals:   08/26/16 0511 08/26/16 0745 08/26/16 0808 08/26/16 1120  BP: (!) 165/61 (!) 168/56  (!) 167/51  Pulse: 62 71  78  Resp: 18 18  14   Temp: 97.9 F (36.6 C)   97.8 F (36.6 C)  TempSrc: Oral   Oral  SpO2: 93% 100% 98% 99%  Weight:      Height:        Intake/Output Summary (Last 24 hours) at 08/26/16 1140 Last data filed at 08/26/16 0745  Gross per 24 hour  Intake              480 ml  Output             2050 ml  Net            -1570 ml   Filed Weights   08/21/16 1324 08/21/16 2117  Weight: 114 lb (51.7 kg) 123 lb 4.8 oz (55.9 kg)    Telemetry    No afib Personally reviewed    ECG    3/18 sinus rhythm at 60. No conduction abnormalities - Personally Reviewed  Physical Exam  Well developed and cachectic in mild respiratorydistress HENT normal Neck supple  with JVP-flat Carotids brisk and full without bruits Clear Regular rate and rhythm, 3/6 systolic Abd-soft with active BS without hepatomegaly No Clubbing cyanosis edema Skin-warm and dry A & Oriented  Grossly normal sensory and motor function   Labs    Chemistry  Recent Labs Lab 08/22/16 0125 08/23/16 1038 08/24/16 0540 08/26/16 0357  NA 130* 135 137  --   K 3.9 3.8 3.6  --   CL 101 104 102  --   CO2 22 21* 26  --   GLUCOSE 90 178* 106*  --   BUN 33* 36* 40*  --   CREATININE 1.60* 1.84* 1.78* 1.95*  CALCIUM 8.4* 8.9 9.0  --   PROT  --   --  6.6  --   ALBUMIN  --   --  3.1*  --   AST  --   --  24  --   ALT  --   --  17  --   ALKPHOS  --   --  79  --   BILITOT  --   --  0.5  --   GFRNONAA 27* 23* 24* 22*  GFRAA 32* 27* 28* 25*  ANIONGAP 7 10 9   --      Hematology  Recent Labs Lab 08/21/16 1325 08/23/16 1038  08/26/16 0357  WBC 13.1* 15.8* 7.9  RBC 3.32* 3.21* 3.53*  HGB 10.6* 10.0* 11.3*  HCT 30.5* 29.4* 32.7*  MCV 91.9 91.8 92.6  MCH 32.1 31.1 31.9  MCHC 34.9 33.8 34.5  RDW 13.5 13.9 13.9  PLT 254 249 360    Cardiac Enzymes  Recent Labs Lab 08/21/16 1325 08/21/16 2112 08/22/16 0125 08/22/16 0502  TROPONINI 0.03* 0.04* 0.05* 0.06*   No results for input(s): TROPIPOC in the last 168 hours.   BNP  Recent Labs Lab 08/21/16 1523  BNP 1,207.0*     DDimer No results for input(s): DDIMER in the last 168 hours.   Radiology    No results found.  Cardiac Studies    Echo 6/18  Mild AS  Mean gradient 17  LAE ( 39/2.5/25)      Assessment & Plan    Afib paroxysmal with RVR  Hypertensive heart disease  Pneumonia  Renal insufficiency worsening   Renovascular hypertension with prior stenting  Anemia   Tolerating amiodarone for atrial fibrillation without recurrent atrial fibrillation  I'm concerned about her pleuritic chest pain. Unfortunately, she is not a candidate for CT scanning but she is on anticoagulation because of her atrial fibrillation , although not at the PE dose  Repeat chest x-ray may be of value. We'll defer this to the primary service.  Worsening renal insufficiency makes me concerned about being more aggressive about treating her elevated blood pressure for right now. Would defer. Signed, Virl Axe, MD  08/26/2016, 11:40 AM

## 2016-08-27 ENCOUNTER — Ambulatory Visit: Payer: Medicare Other | Admitting: Surgery

## 2016-08-27 DIAGNOSIS — I10 Essential (primary) hypertension: Secondary | ICD-10-CM

## 2016-08-27 DIAGNOSIS — N189 Chronic kidney disease, unspecified: Secondary | ICD-10-CM

## 2016-08-27 LAB — BASIC METABOLIC PANEL
ANION GAP: 8 (ref 5–15)
BUN: 47 mg/dL — ABNORMAL HIGH (ref 6–20)
CHLORIDE: 98 mmol/L — AB (ref 101–111)
CO2: 27 mmol/L (ref 22–32)
Calcium: 9.1 mg/dL (ref 8.9–10.3)
Creatinine, Ser: 2.22 mg/dL — ABNORMAL HIGH (ref 0.44–1.00)
GFR calc non Af Amer: 18 mL/min — ABNORMAL LOW (ref >60)
GFR, EST AFRICAN AMERICAN: 21 mL/min — AB (ref >60)
Glucose, Bld: 105 mg/dL — ABNORMAL HIGH (ref 65–99)
POTASSIUM: 4.3 mmol/L (ref 3.5–5.1)
Sodium: 133 mmol/L — ABNORMAL LOW (ref 135–145)

## 2016-08-27 MED ORDER — SODIUM CHLORIDE 0.9% FLUSH
3.0000 mL | Freq: Two times a day (BID) | INTRAVENOUS | Status: DC
  Administered 2016-08-27 – 2016-08-30 (×6): 3 mL via INTRAVENOUS

## 2016-08-27 MED ORDER — AZITHROMYCIN 250 MG PO TABS
500.0000 mg | ORAL_TABLET | Freq: Every day | ORAL | Status: AC
  Administered 2016-08-28: 500 mg via ORAL
  Filled 2016-08-27: qty 2

## 2016-08-27 MED ORDER — HYDRALAZINE HCL 50 MG PO TABS
50.0000 mg | ORAL_TABLET | Freq: Three times a day (TID) | ORAL | Status: DC
  Administered 2016-08-27 – 2016-08-28 (×3): 50 mg via ORAL
  Filled 2016-08-27 (×3): qty 1

## 2016-08-27 NOTE — Progress Notes (Signed)
PHARMACIST - PHYSICIAN COMMUNICATION DR:   Vianne Bulls CONCERNING: Antibiotic IV to Oral Route Change Policy  RECOMMENDATION: This patient is receiving azithromycin by the intravenous route.  Based on criteria approved by the Pharmacy and Therapeutics Committee, the antibiotic(s) is/are being converted to the equivalent oral dose form(s).   DESCRIPTION: These criteria include:  Patient being treated for a respiratory tract infection, urinary tract infection, cellulitis or clostridium difficile associated diarrhea if on metronidazole  The patient is not neutropenic and does not exhibit a GI malabsorption state  The patient is eating (either orally or via tube) and/or has been taking other orally administered medications for a least 24 hours  The patient is improving clinically and has a Tmax < 100.5  If you have questions about this conversion, please contact the [place], PharmD 08/27/16 11:33 AM

## 2016-08-27 NOTE — Progress Notes (Signed)
Patient Name: Joanne Michael Date of Encounter: 08/27/2016  Primary Cardiologist: [facility] Problem List     Principal Problem:   Acute respiratory distress Active Problems:   PNA (pneumonia)   New onset atrial fibrillation (HCC)   CKD (chronic kidney disease), stage III   Acute on chronic diastolic CHF (congestive heart failure) (HCC)   Chest pain   Atrial fibrillation with RVR (HCC)     Subjective   Continues to improve. No chest pain or SOB. Low risk VQ scan. Renal function continues to climb. Regarding her renal artery stent, her Plavix was stopped today. BP in the 867E systolic.   Inpatient Medications    Scheduled Meds: . allopurinol  100 mg Oral BID  . amiodarone  400 mg Oral BID  . apixaban  2.5 mg Oral BID  . atorvastatin  20 mg Oral QHS  . calcium-vitamin D  1 tablet Oral Q breakfast  . carvedilol  3.125 mg Oral BID WC  . cloNIDine  0.1 mg Oral TID  . collagenase  1 application Topical Daily  . ferrous sulfate  325 mg Oral Q breakfast  . hydrALAZINE  25 mg Oral Q8H  . levalbuterol  0.63 mg Nebulization Q8H  . loratadine  10 mg Oral Daily  . magnesium oxide  400 mg Oral Daily  . multivitamin with minerals  1 tablet Oral Daily  . pantoprazole  40 mg Oral QHS  . predniSONE  10 mg Oral Daily   Continuous Infusions: . azithromycin Stopped (08/26/16 1115)  . cefTRIAXone (ROCEPHIN)  IV Stopped (08/26/16 1045)   PRN Meds: acetaminophen **OR** acetaminophen, fluticasone, nitroGLYCERIN, ondansetron **OR** ondansetron (ZOFRAN) IV   Vital Signs    Vitals:   08/26/16 2006 08/26/16 2318 08/27/16 0557 08/27/16 0755  BP: (!) 170/53  (!) 157/44   Pulse: 80  (!) 58   Resp: 18  18   Temp: 97.9 F (36.6 C)  98 F (36.7 C)   TempSrc: Oral  Oral   SpO2: 96% 98% 97% 97%  Weight:      Height:        Intake/Output Summary (Last 24 hours) at 08/27/16 0936 Last data filed at 08/27/16 0913  Gross per 24 hour  Intake              360 ml  Output               600 ml  Net             -240 ml   Filed Weights   08/21/16 1324 08/21/16 2117  Weight: 114 lb (51.7 kg) 123 lb 4.8 oz (55.9 kg)    Physical Exam    GEN: Well nourished, well developed, in no acute distress.  HEENT: Grossly normal.  Neck: Supple, no JVD, carotid bruits, or masses. Cardiac: RRR,  II/VI systolic murmur, no rubs, or gallops. No clubbing, cyanosis, edema.  Radials/DP/PT 2+ and equal bilaterally.  Respiratory:  Respirations regular and unlabored, clear to auscultation bilaterally. GI: Soft, nontender, nondistended, BS + x 4. MS: no deformity or atrophy. Skin: warm and dry, no rash. Neuro:  Strength and sensation are intact. Psych: AAOx3.  Normal affect.  Labs    CBC  Recent Labs  08/26/16 0357  WBC 7.9  HGB 11.3*  HCT 32.7*  MCV 92.6  PLT 720   Basic Metabolic Panel  Recent Labs  08/26/16 0357 08/27/16 0530  NA  --  133*  K  --  4.3  CL  --  98*  CO2  --  27  GLUCOSE  --  105*  BUN  --  47*  CREATININE 1.95* 2.22*  CALCIUM  --  9.1   Liver Function Tests No results for input(s): AST, ALT, ALKPHOS, BILITOT, PROT, ALBUMIN in the last 72 hours. No results for input(s): LIPASE, AMYLASE in the last 72 hours. Cardiac Enzymes No results for input(s): CKTOTAL, CKMB, CKMBINDEX, TROPONINI in the last 72 hours. BNP Invalid input(s): POCBNP D-Dimer No results for input(s): DDIMER in the last 72 hours. Hemoglobin A1C No results for input(s): HGBA1C in the last 72 hours. Fasting Lipid Panel No results for input(s): CHOL, HDL, LDLCALC, TRIG, CHOLHDL, LDLDIRECT in the last 72 hours. Thyroid Function Tests No results for input(s): TSH, T4TOTAL, T3FREE, THYROIDAB in the last 72 hours.  Invalid input(s): FREET3  Telemetry    NSR, 70s bpm - Personally Reviewed  ECG    n/a - Personally Reviewed  Radiology    No results found.  Cardiac Studies   TTE 07/31/2016: Study Conclusions  - Left ventricle: The cavity size was normal. Wall thickness  was   increased in a pattern of moderate LVH. Systolic function was   vigorous. The estimated ejection fraction was in the range of 65%   to 70%. Wall motion was normal; there were no regional wall   motion abnormalities. Doppler parameters are consistent with   abnormal left ventricular relaxation (grade 1 diastolic   dysfunction). - Aortic valve: Probably trileaflet; moderately thickened leaflets.   Cusp separation was reduced. Transvalvular velocity was   increased. There was mild to moderate stenosis. There was mild to   moderate regurgitation. Valve area (VTI): 1.22 cm^2. Valve area   (Vmean): 1.02 cm^2. - Mitral valve: Mildly thickened leaflets . There was mild   regurgitation. - Right ventricle: The cavity size was normal. Wall thickness was   normal. Systolic function was normal.  Patient Profile     83 y.o. female with history of mild to moderate aortic stenosis by recent echocardiogram in early June 2018, CKDstage III, prior episode of near syncope in the setting of dehydration, mild carotid arterial disease, renal artery stenosis status post renal artery stenting in 04/2016 on Plavix at home, chronic lower extremity swelling that has previously been exacerbated by amlodipine, previously poorly controlled hypertension now improved status post renal artery stenting, hyperlipidemia, and GERD who presented to [facility] with one-day history of pleuritic chest pain. During her admission she developed new onset Afib with RVR on 6/28 from 6:10-8:51 AM.   Assessment & Plan    1. PAF with RVR: -Remains in NSR -Tolerating amiodarone, continue 400 mg bid for total of 1 week followed by 200 mg bid x 1 week, then 200 mg daily thereafter -Coreg 3.125 mg bid (has had issues with sinus bradycardia at baseline) -Eliquis 2.5 mg bid (will need to monitor renal function, if continues to worsen she may need Coumadin  2. HTN: -Increase hydralazine to 50 mg q 8 hours -Continue Coreg and clonidine -Has  intolerance to amlodipine   3. Pleuritic chest pain: -Low risk VQ scan -Consider CXR -Per IM  4. Acute on CKD stage III: -Status post recent renal artery stenting -Off Plavix as of today per IM -Monitor renal function  Signed, Christell Faith, PA-C [place] Pager: [phone] 08/27/2016, 9:36 AM

## 2016-08-27 NOTE — Consult Note (Signed)
[facility] Vascular Consult Note  MRN : [id number]  Joanne Michael is a 83 y.o. ([birth year]) female who presents with chief complaint of  Chief Complaint  Patient presents with  . Chest Pain  .  History of Present Illness: I am asked by Dr. Vianne Bulls to see the patient regarding her medical regimen for previous renal artery stent placement.  She had a renal stent placed 4 months ago for renovascular HTN and CKD.  Her CKD has been stable and her BP control has been much better.  She is here now for new onset a. Fib and is being put on Eliquis.  Her Plavix was for her stent and the question is whether or not this can be discontinued now.  She feels better today and her heart is not racing as much.  No other complaints.  Current Facility-Administered Medications  Medication Dose Route Frequency Provider Last Rate Last Dose  . acetaminophen (TYLENOL) tablet 650 mg  650 mg Oral Q6H PRN Henreitta Leber, MD       Or  . acetaminophen (TYLENOL) suppository 650 mg  650 mg Rectal Q6H PRN Henreitta Leber, MD      . allopurinol (ZYLOPRIM) tablet 100 mg  100 mg Oral BID Henreitta Leber, MD   100 mg at 08/27/16 0939  . amiodarone (PACERONE) tablet 400 mg  400 mg Oral BID Deboraha Sprang, MD   400 mg at 08/27/16 1000  . apixaban (ELIQUIS) tablet 2.5 mg  2.5 mg Oral BID Christell Faith M, PA-C   2.5 mg at 08/27/16 2694  . atorvastatin (LIPITOR) tablet 20 mg  20 mg Oral QHS Henreitta Leber, MD   20 mg at 08/26/16 2101  . [START ON 08/28/2016] azithromycin (ZITHROMAX) tablet 500 mg  500 mg Oral Daily Swayne, Mary M, RPH      . calcium-vitamin D (OSCAL WITH D) 500-200 MG-UNIT per tablet 1 tablet  1 tablet Oral Q breakfast Henreitta Leber, MD   1 tablet at 08/27/16 [phone]  . carvedilol (COREG) tablet 3.125 mg  3.125 mg Oral BID WC Kathlyn Sacramento A, MD   3.125 mg at 08/27/16 0939  . cefTRIAXone (ROCEPHIN) 1 g in dextrose 5 % 50 mL IVPB  1 g Intravenous Daily Epifanio Lesches, MD    Stopped at 08/27/16 1010  . cloNIDine (CATAPRES) tablet 0.1 mg  0.1 mg Oral TID Epifanio Lesches, MD   0.1 mg at 08/27/16 0939  . collagenase (SANTYL) ointment 1 application  1 application Topical Daily Henreitta Leber, MD   1 application at 27/03/50 1624  . ferrous sulfate tablet 325 mg  325 mg Oral Q breakfast Henreitta Leber, MD   325 mg at 08/27/16 0938  . fluticasone (FLONASE) 50 MCG/ACT nasal spray 2 spray  2 spray Each Nare QHS PRN Henreitta Leber, MD      . hydrALAZINE (APRESOLINE) tablet 50 mg  50 mg Oral Q8H Dunn, Ryan M, PA-C   50 mg at 08/27/16 1338  . levalbuterol (XOPENEX) nebulizer solution 0.63 mg  0.63 mg Nebulization Q8H Epifanio Lesches, MD   0.63 mg at 08/27/16 1500  . loratadine (CLARITIN) tablet 10 mg  10 mg Oral Daily Henreitta Leber, MD   10 mg at 08/27/16 0939  . magnesium oxide (MAG-OX) tablet 400 mg  400 mg Oral Daily Christell Faith M, PA-C   400 mg at 08/27/16 0940  . multivitamin with minerals tablet 1 tablet  1  tablet Oral Daily Henreitta Leber, MD   1 tablet at 08/27/16 0939  . nitroGLYCERIN (NITROSTAT) SL tablet 0.4 mg  0.4 mg Sublingual Q5 min PRN Henreitta Leber, MD   0.4 mg at 08/26/16 1343  . ondansetron (ZOFRAN) tablet 4 mg  4 mg Oral Q6H PRN Henreitta Leber, MD       Or  . ondansetron (ZOFRAN) injection 4 mg  4 mg Intravenous Q6H PRN Henreitta Leber, MD      . pantoprazole (PROTONIX) EC tablet 40 mg  40 mg Oral QHS Henreitta Leber, MD   40 mg at 08/26/16 2101  . predniSONE (DELTASONE) tablet 10 mg  10 mg Oral Daily Epifanio Lesches, MD   10 mg at 08/27/16 3474    Past Medical History:  Diagnosis Date  . Arthritis   . Cancer ([place])    melanoma  . Cat allergies   . Chronic renal failure   . Fatigue   . GERD (gastroesophageal reflux disease)   . Gout   . H/O Clostridium difficile infection   . Hay fever    as child  . Hiatal hernia   . HLD (hyperlipidemia)   . HTN (hypertension)   . Iron deficiency anemia   . Spinal stenosis      Past Surgical History:  Procedure Laterality Date  . APPENDECTOMY    . BACK SURGERY    . CATARACT EXTRACTION    . CHOLECYSTECTOMY    . feet surgery    . HERNIA REPAIR    . knee replacement- both    . MELANOMA EXCISION    . NOSE SURGERY    . RENAL ANGIOGRAPHY N/A 05/07/2016   Procedure: Renal Angiography;  Surgeon: Algernon Huxley, MD;  Location: [facility];  Service: Cardiovascular;  Laterality: N/A;  . ROTATOR CUFF REPAIR     left  . SHOULDER SURGERY    . TONSILLECTOMY    . TOTAL ABDOMINAL HYSTERECTOMY      Social History Social History  Substance Use Topics  . Smoking status: Never Smoker  . Smokeless tobacco: Never Used     Comment: Tobacco use- no   . Alcohol use 1.8 oz/week    3 Glasses of wine per week  No IVDU  Family History Family History  Problem Relation Age of Onset  . Ovarian cancer Mother   . Liver cancer Father   . Heart failure Father   . Lung cancer Brother   . Prostate cancer Brother   . Lung cancer Sister     Allergies  Allergen Reactions  . Oxycodone Itching  . Codeine Nausea And Vomiting and Itching  . Hydrocodone-Acetaminophen Other (See Comments)    Hallucinates  . Iodine Swelling, Other (See Comments), Itching and Nausea Only    Patient is allergic to seafood and this is the cause why she can't take medication.  . Meperidine Hcl     Unknown reactions.  . Other Swelling    Seafood- tongue swelling and discoloration Patient allergic to seafood, tongue swelling and discoloration.  . Propoxyphene N-Acetaminophen Other (See Comments)    Unknown reaction     REVIEW OF SYSTEMS (Negative unless checked)  Constitutional: [] Weight loss  [] Fever  [] Chills Cardiac: [] Chest pain   [] Chest pressure   [x] Palpitations   [] Shortness of breath when laying flat   [] Shortness of breath at rest   [x] Shortness of breath with exertion. Vascular:  [] Pain in legs with walking   [] Pain in legs  at rest   [] Pain in legs when laying flat    [] Claudication   [] Pain in feet when walking  [] Pain in feet at rest  [] Pain in feet when laying flat   [] History of DVT   [] Phlebitis   [] Swelling in legs   [] Varicose veins   [] Non-healing ulcers Pulmonary:   [] Uses home oxygen   [] Productive cough   [] Hemoptysis   [] Wheeze  [] COPD   [] Asthma Neurologic:  [] Dizziness  [] Blackouts   [] Seizures   [] History of stroke   [] History of TIA  [] Aphasia   [] Temporary blindness   [] Dysphagia   [] Weakness or numbness in arms   [] Weakness or numbness in legs Musculoskeletal:  [x] Arthritis   [] Joint swelling   [] Joint pain   [] Low back pain Hematologic:  [] Easy bruising  [] Easy bleeding   [] Hypercoagulable state   [] Anemic  [] Hepatitis Gastrointestinal:  [] Blood in stool   [] Vomiting blood  [] Gastroesophageal reflux/heartburn   [] Difficulty swallowing. Genitourinary:  [x] Chronic kidney disease   [] Difficult urination  [] Frequent urination  [] Burning with urination   [] Blood in urine Skin:  [] Rashes   [] Ulcers   [] Wounds Psychological:  [] History of anxiety   []  History of major depression.  Physical Examination  Vitals:   08/27/16 0725 08/27/16 0755 08/27/16 1059 08/27/16 1500  BP: (!) 144/61  (!) 122/39   Pulse: 68  69   Resp: 20  18   Temp:   98.2 F (36.8 C)   TempSrc:   Oral   SpO2: 96% 97% 98% 98%  Weight:      Height:       Body mass index is 24.9 kg/m. Gen:  WD/WN, NAD. Appears younger than stated age. Head: [place]/AT, No temporalis wasting.  Ear/Nose/Throat: Hearing grossly intact, nares w/o erythema or drainage, oropharynx w/o Erythema/Exudate Eyes: Sclera non-icteric, conjunctiva clear Neck: Trachea midline.  No JVD.  Pulmonary:  Good air movement, respirations not labored, equal bilaterally.  Cardiac: irregular but not tachycardic Vascular:  Vessel Right Left  Radial Palpable Palpable                                    Musculoskeletal: M/S 5/5 throughout.  Extremities without ischemic changes.  No deformity or atrophy.   Neurologic: Sensation grossly intact in extremities.  Symmetrical.  Speech is fluent. Motor exam as listed above. Psychiatric: Judgment intact, Mood & affect appropriate for pt's clinical situation. Dermatologic: No rashes or ulcers noted.  No cellulitis or open wounds.       CBC Lab Results  Component Value Date   WBC 7.9 08/26/2016   HGB 11.3 (L) 08/26/2016   HCT 32.7 (L) 08/26/2016   MCV 92.6 08/26/2016   PLT 360 08/26/2016    BMET    Component Value Date/Time   NA 133 (L) 08/27/2016 0530   NA 139 09/26/2013 0519   K 4.3 08/27/2016 0530   K 4.3 09/26/2013 0519   CL 98 (L) 08/27/2016 0530   CL 105 09/26/2013 0519   CO2 27 08/27/2016 0530   CO2 28 09/26/2013 0519   GLUCOSE 105 (H) 08/27/2016 0530   GLUCOSE 115 (H) 09/26/2013 0519   BUN 47 (H) 08/27/2016 0530   BUN 19 (H) 09/26/2013 0519   CREATININE 2.22 (H) 08/27/2016 0530   CREATININE 1.31 (H) 09/26/2013 0519   CALCIUM 9.1 08/27/2016 0530   CALCIUM 9.1 09/26/2013 0519   GFRNONAA 18 (L) 08/27/2016 0530  GFRNONAA 37 (L) 09/26/2013 0519   GFRAA 21 (L) 08/27/2016 0530   GFRAA 43 (L) 09/26/2013 0519   Estimated Creatinine Clearance: 13.1 mL/min (A) (by C-G formula based on SCr of 2.22 mg/dL (H)).  COAG Lab Results  Component Value Date   INR 0.9 09/23/2013    Radiology Dg Chest 2 View  Result Date: 08/21/2016 CLINICAL DATA:  Mid chest pain with shortness of breath which is got worse over the past week. Nonsmoker, history of hypertension, aortic stenosis, primary pulmonary hypertension, history of mycobacterial lung infection. EXAM: CHEST  2 VIEW COMPARISON:  Chest x-ray of May 05, 2016 FINDINGS: The lungs are mildly hyperinflated with hemidiaphragm flattening. There is persistent increased density at the left lung base with obscuration of the hemidiaphragm. The lung markings are coarse in the right infrahilar region but also stable. The heart and pulmonary vascularity are normal. There is calcification in the  wall of the thoracic aorta. The bony thorax exhibits no acute abnormality. IMPRESSION: Chronic bronchitic changes, stable. No CHF. Persistent bibasilar densities greatest on the left which may reflect scarring but superimposed pneumonia is not excluded. Followup PA and lateral chest X-ray is recommended in 3-4 weeks following trial of antibiotic therapy to ensure resolution and exclude underlying malignancy. Thoracic aortic atherosclerosis. Electronically Signed   By: David  Martinique M.D.   On: 08/21/2016 14:17   Nm Pulmonary Perf And Vent  Result Date: 08/22/2016 CLINICAL DATA:  Pleuritic chest pain. EXAM: NUCLEAR MEDICINE VENTILATION - PERFUSION LUNG SCAN TECHNIQUE: Ventilation images were obtained in multiple projections using inhaled aerosol Tc-46m DTPA. Perfusion images were obtained in multiple projections after intravenous injection of Tc-85m MAA. RADIOPHARMACEUTICALS:  32.8 MCi Technetium-20m DTPA aerosol inhalation and 3.8 mCi Technetium-9m MAA IV COMPARISON:  08/21/2016. FINDINGS: Prominent patchy ventilation defects. Tiny perfusion defects addendum after smaller than ventilation defects. This scan is low probability for pulmonary embolus . IMPRESSION: Low probability for pulmonary embolus. Very poor ventilation suggesting COPD. Electronically Signed   By: Marcello Moores  Register   On: 08/22/2016 11:59   US Venous Img Lower Bilateral  Result Date: 08/21/2016 CLINICAL DATA:  83 year old female with leg swelling for years. Initial encounter. EXAM: BILATERAL LOWER EXTREMITY VENOUS DOPPLER ULTRASOUND TECHNIQUE: Gray-scale sonography with graded compression, as well as color Doppler and duplex ultrasound were performed to evaluate the lower extremity deep venous systems from the level of the common femoral vein and including the common femoral, femoral, profunda femoral, popliteal and calf veins including the posterior tibial, peroneal and gastrocnemius veins when visible. Spectral Doppler was utilized to  evaluate flow at rest and with distal augmentation maneuvers in the common femoral, femoral and popliteal veins. COMPARISON:  None. FINDINGS: RIGHT LOWER EXTREMITY Common Femoral Vein: No evidence of thrombus. Normal compressibility, respiratory phasicity and response to augmentation. Saphenofemoral Junction: No evidence of thrombus. Normal compressibility and flow on color Doppler imaging. Profunda Femoral Vein: No evidence of thrombus. Normal compressibility and flow on color Doppler imaging. Femoral Vein: No evidence of thrombus. Normal compressibility, respiratory phasicity and response to augmentation. Popliteal Vein: No evidence of thrombus. Normal compressibility, respiratory phasicity and response to augmentation. Calf Veins: No evidence of thrombus. Normal compressibility and flow on color Doppler imaging. Other Findings:  None. LEFT LOWER EXTREMITY Common Femoral Vein: No evidence of thrombus. Normal compressibility, respiratory phasicity and response to augmentation. Saphenofemoral Junction: No evidence of thrombus. Normal compressibility and flow on color Doppler imaging. Profunda Femoral Vein: No evidence of thrombus. Normal compressibility and flow on color Doppler imaging. Femoral  Vein: No evidence of thrombus. Normal compressibility, respiratory phasicity and response to augmentation. Popliteal Vein: No evidence of thrombus. Normal compressibility, respiratory phasicity and response to augmentation. Calf Veins: No evidence of thrombus. Normal compressibility and flow on color Doppler imaging. Other Findings:  None. IMPRESSION: No evidence of DVT within either lower extremity. Evaluation of left peroneal vein limited by edema and bandage. Electronically Signed   By: Genia Del M.D.   On: 08/21/2016 17:18   Dg Chest Port 1 View  Result Date: 08/23/2016 CLINICAL DATA:  New onset of atrial fibrillation, hypertension EXAM: PORTABLE CHEST 1 VIEW COMPARISON:  Chest x-ray of 08/21/2016 hand 05/05/2016  FINDINGS: There is persistent opacity at the left lung base with apparent left pleural effusion. In view of the persistent this opacity, CT of the chest is recommended to exclude underlying neoplasm. The right lung is clear. Mediastinal and hilar contours are unremarkable. The heart is within upper limits of normal and stable. No bony abnormality is seen IMPRESSION: Persistent opacity at the left lung base consistent with effusion, atelectasis and possibly pneumonia. However in view of the persistence of this opacity, CT of the chest is recommended to exclude underlying neoplasm. Electronically Signed   By: Ivar Drape M.D.   On: 08/23/2016 10:39      Assessment/Plan 1. RAS s/p stent placement four months ago.  Now on Eliquis.  Would d/c Plavix and use an 81 mg ASA in addition to Eliquis.  Has follow up in our office later this year with renal artery duplex 2. New onset a. Fib. Rate now controlled.  Being placed on Eliquis which sounds reasonable 3. CKD.  A little worse today.  With cardiac function issues, may be related to ATN.  Hydrate as tolerated and follow.   Leotis Pain, MD  08/27/2016 4:08 PM    This note was created with Dragon medical transcription system.  Any error is purely unintentional

## 2016-08-27 NOTE — Progress Notes (Signed)
[facility] NAME: Joanne Michael    MR#:  [id number]  DATE OF BIRTH:  03-Sep-1924   Heart rate better controlled, denies any complaints, patient has worsening kidney function today. .   CHIEF COMPLAINT:   Chief Complaint  Patient presents with  . Chest Pain    REVIEW OF SYSTEMS:   ROS CONSTITUTIONAL: No fever, fatigue or weakness.  EYES: No blurred or double vision.  EARS, NOSE, AND THROAT: No tinnitus or ear pain.  RESPIRATORY: cough And shortness of breath.  CARDIOVASCULAR: palpitations today. GASTROINTESTINAL: No nausea, vomiting, diarrhea or abdominal pain.  GENITOURINARY: No dysuria, hematuria.  ENDOCRINE: No polyuria, nocturia,  HEMATOLOGY: No anemia, easy bruising or bleeding SKIN: No rash or lesion. MUSCULOSKELETAL: No joint pain or arthritis.   NEUROLOGIC: No tingling, numbness, weakness.  PSYCHIATRY: No anxiety or depression.   DRUG ALLERGIES:   Allergies  Allergen Reactions  . Oxycodone Itching  . Codeine Nausea And Vomiting and Itching  . Hydrocodone-Acetaminophen Other (See Comments)    Hallucinates  . Iodine Swelling, Other (See Comments), Itching and Nausea Only    Patient is allergic to seafood and this is the cause why she can't take medication.  . Meperidine Hcl     Unknown reactions.  . Other Swelling    Seafood- tongue swelling and discoloration Patient allergic to seafood, tongue swelling and discoloration.  . Propoxyphene N-Acetaminophen Other (See Comments)    Unknown reaction    VITALS:  Blood pressure (!) 122/39, pulse 69, temperature 98.2 F (36.8 C), temperature source Oral, resp. rate 18, height 4\' 11"  (1.499 m), weight 55.9 kg (123 lb 4.8 oz), SpO2 98 %.  PHYSICAL EXAMINATION:  GENERAL:  83 y.o.-year-old patient lying in the bed with no acute distress.  EYES: Pupils equal, round, reactive to light and accommodation. No scleral icterus. Extraocular muscles intact.  HEENT:  Head atraumatic, normocephalic. Oropharynx and nasopharynx clear.  NECK:  Supple, no jugular venous distention. No thyroid enlargement, no tenderness.  LUNGS: decreased breath sounds in the left base.  CARDIOVASCULAR: S1, S2 regular. No murmurs, rubs, or gallops.  ABDOMEN: Soft, nontender, nondistended. Bowel sounds present. No organomegaly or mass.  EXTREMITIES: No pedal edema, cyanosis, or clubbing.  NEUROLOGIC: Cranial nerves II through XII are intact. Muscle strength 5/5 in all extremities. Sensation intact. Gait not checked.  PSYCHIATRIC: The patient is alert and oriented x 3.  SKIN: No obvious rash, lesion, or ulcer.    LABORATORY PANEL:   CBC  Recent Labs Lab 08/26/16 0357  WBC 7.9  HGB 11.3*  HCT 32.7*  PLT 360   ------------------------------------------------------------------------------------------------------------------  Chemistries   Recent Labs Lab 08/23/16 1038 08/24/16 0540  08/27/16 0530  NA 135 137  --  133*  K 3.8 3.6  --  4.3  CL 104 102  --  98*  CO2 21* 26  --  27  GLUCOSE 178* 106*  --  105*  BUN 36* 40*  --  47*  CREATININE 1.84* 1.78*  < > 2.22*  CALCIUM 8.9 9.0  --  9.1  MG 1.9  --   --   --   AST  --  24  --   --   ALT  --  17  --   --   ALKPHOS  --  79  --   --   BILITOT  --  0.5  --   --   < > = values in this interval  not displayed. ------------------------------------------------------------------------------------------------------------------  Cardiac Enzymes  Recent Labs Lab 08/22/16 0502  TROPONINI 0.06*   ------------------------------------------------------------------------------------------------------------------  RADIOLOGY:  No results found.  EKG:   Orders placed or performed during the hospital encounter of 08/21/16  . EKG 12-Lead  . EKG 12-Lead  . ED EKG within 10 minutes  . ED EKG within 10 minutes  . EKG 12-Lead  . EKG 12-Lead    ASSESSMENT AND PLAN:   Chest pain, shortness of breath likely  secondary to reactive airway disease And left lower lobe pneumonia. Continue IV Rocephin, Zithromax,changed nebs to xopenox.ct chest with contrast can not be done due to kidney disease and renal dysfunction.continue V antibiotics1 more day to finish 7 day course.  , #2/acute on chronic kidney disease  Stage 3; and kidney function likely due to overdiuresis:*  The Lasix. Monitor kidney function closely. Obtain nephrology consult, vascular consult because patient had renal artery stent in March.  #3 .  Acute on chronic diastolic heart failure with elevated BNP. Received  IV Lasix for 3 days, stopped  yesterday because of worsening kidney function. 4. Essential hypertension, bradycardia. Episode 2 days ago.now has paroxysmal afib with RVR  #5. new onset A. Fib: continue  Po amiodarone, continue eliquis ,amiodarone  500 MG twice a day for 7 days, followed by 200 MG twice a day for 7 days followed by 200 MG daily as per cardiology note.  6. History of renal artery  Stent  3 months ago, vascular consult' D/w family.,RN   Walked around nurses station and HR stayed  Around 73.  All the records are reviewed and case discussed with Care Management/Social Workerr. Management plans discussed with the patient, family and they are in agreement.  CODE STATUS:full  TOTAL TIME TAKING CARE OF THIS PATIENT: 35 minutes.   POSSIBLE D/C IN 1-2DAYS, DEPENDING ON CLINICAL CONDITION.   Epifanio Lesches M.D on 08/27/2016 at 2:12 PM  Between 7am to 6pm - Pager - [phone]  After 6pm go to [email or web address] - password [email or web address] [facility]  Office  [phone]  CC: Primary care physician; Dion Body, MD   Note: This dictation was prepared with Dragon dictation along with smaller phrase technology. Any transcriptional errors that result from this process are unintentional.

## 2016-08-28 LAB — CREATININE, SERUM
CREATININE: 2.28 mg/dL — AB (ref 0.44–1.00)
GFR, EST AFRICAN AMERICAN: 21 mL/min — AB (ref >60)
GFR, EST NON AFRICAN AMERICAN: 18 mL/min — AB (ref >60)

## 2016-08-28 MED ORDER — HYDRALAZINE HCL 50 MG PO TABS: 100.0000 mg | ORAL_TABLET | Freq: Three times a day (TID) | ORAL | Status: DC

## 2016-08-28 MED ORDER — CLONIDINE HCL 0.1 MG PO TABS
0.1000 mg | ORAL_TABLET | Freq: Two times a day (BID) | ORAL | Status: DC
  Administered 2016-08-28 – 2016-08-30 (×4): 0.1 mg via ORAL
  Filled 2016-08-28 (×4): qty 1

## 2016-08-28 MED ORDER — HYDRALAZINE HCL 50 MG PO TABS
50.0000 mg | ORAL_TABLET | Freq: Three times a day (TID) | ORAL | Status: DC
  Administered 2016-08-28 – 2016-08-30 (×5): 50 mg via ORAL
  Filled 2016-08-28 (×5): qty 1

## 2016-08-28 MED ORDER — ISOSORBIDE MONONITRATE ER 30 MG PO TB24
30.0000 mg | ORAL_TABLET | Freq: Every day | ORAL | Status: DC
  Administered 2016-08-29 – 2016-08-30 (×2): 30 mg via ORAL
  Filled 2016-08-28 (×2): qty 1

## 2016-08-28 NOTE — Plan of Care (Signed)
Problem: Pain Managment: Goal: General experience of comfort will improve Outcome: Not Progressing Prn medications as needed

## 2016-08-28 NOTE — Progress Notes (Signed)
   She has remained in sinus rhythm on amiodarone. Renal function mostly stable, though is slightly increased today. Recommend continued monitoring and increase of PO fluid intake. If her renal function continues to worsen, she may need Coumadin over Eliquis. BP remains poorly controlled, defer to IM.

## 2016-08-28 NOTE — Progress Notes (Signed)
Discussed with IM. Will decrease clonidine given bradycardia. However, given her BP we will add Imdur and titrate as needed. As her heart rate improves off clonidine consider titration of Coreg.

## 2016-08-28 NOTE — Progress Notes (Signed)
Christell Faith PA made aware of BP 144/34, verbal order to hold Imdur.

## 2016-08-28 NOTE — Progress Notes (Signed)
[facility] [place] NAME: Joanne Michael    MR#:  [id number]  DATE OF BIRTH:  02/05/1925   Patient denies any complaints.  renal function is still not improving yet. No chest pain or shortness of breath. Heart rate improved. Has slight bradycardia, elevated blood pressure today.. .   CHIEF COMPLAINT:   Chief Complaint  Patient presents with  . Chest Pain    REVIEW OF SYSTEMS:   ROS CONSTITUTIONAL: No fever, fatigue or weakness.  EYES: No blurred or double vision.  EARS, NOSE, AND THROAT: No tinnitus or ear pain.  RESPIRATORY: cough And shortness of breath.  CARDIOVASCULAR: palpitations today. GASTROINTESTINAL: No nausea, vomiting, diarrhea or abdominal pain.  GENITOURINARY: No dysuria, hematuria.  ENDOCRINE: No polyuria, nocturia,  HEMATOLOGY: No anemia, easy bruising or bleeding SKIN: No rash or lesion. MUSCULOSKELETAL: No joint pain or arthritis.   NEUROLOGIC: No tingling, numbness, weakness.  PSYCHIATRY: No anxiety or depression.   DRUG ALLERGIES:   Allergies  Allergen Reactions  . Oxycodone Itching  . Codeine Nausea And Vomiting and Itching  . Hydrocodone-Acetaminophen Other (See Comments)    Hallucinates  . Iodine Swelling, Other (See Comments), Itching and Nausea Only    Patient is allergic to seafood and this is the cause why she can't take medication.  . Meperidine Hcl     Unknown reactions.  . Other Swelling    Seafood- tongue swelling and discoloration Patient allergic to seafood, tongue swelling and discoloration.  . Propoxyphene N-Acetaminophen Other (See Comments)    Unknown reaction    VITALS:  Blood pressure (!) 136/43, pulse 72, temperature 98.1 F (36.7 C), temperature source Oral, resp. rate 19, height 4\' 11"  (1.499 m), weight 55.9 kg (123 lb 4.8 oz), SpO2 100 %.  PHYSICAL EXAMINATION:  GENERAL:  83 y.o.-year-old patient lying in the bed with no acute distress.  EYES: Pupils equal, round,  reactive to light ,No scleral icterus. Extraocular muscles intact.  HEENT: Head atraumatic, normocephalic. Oropharynx and nasopharynx clear.  NECK:  Supple, no jugular venous distention. No thyroid enlargement, no tenderness.  LUNGS: decreased breath sounds in the left base.  CARDIOVASCULAR: S1, S2 regular. No murmurs, rubs, or gallops.  ABDOMEN: Soft, nontender, nondistended. Bowel sounds present. No organomegaly or mass.  EXTREMITIES: No pedal edema, cyanosis, or clubbing.  NEUROLOGIC: Cranial nerves II through XII are intact. Muscle strength 5/5 in all extremities. Sensation intact. Gait not checked.  PSYCHIATRIC: The patient is alert and oriented x 3.  SKIN: No obvious rash, lesion, or ulcer.    LABORATORY PANEL:   CBC  Recent Labs Lab 08/26/16 0357  WBC 7.9  HGB 11.3*  HCT 32.7*  PLT 360   ------------------------------------------------------------------------------------------------------------------  Chemistries   Recent Labs Lab 08/23/16 1038 08/24/16 0540  08/27/16 0530 08/28/16 0521  NA 135 137  --  133*  --   K 3.8 3.6  --  4.3  --   CL 104 102  --  98*  --   CO2 21* 26  --  27  --   GLUCOSE 178* 106*  --  105*  --   BUN 36* 40*  --  47*  --   CREATININE 1.84* 1.78*  < > 2.22* 2.28*  CALCIUM 8.9 9.0  --  9.1  --   MG 1.9  --   --   --   --   AST  --  24  --   --   --  ALT  --  17  --   --   --   ALKPHOS  --  79  --   --   --   BILITOT  --  0.5  --   --   --   < > = values in this interval not displayed. ------------------------------------------------------------------------------------------------------------------  Cardiac Enzymes  Recent Labs Lab 08/22/16 0502  TROPONINI 0.06*   ------------------------------------------------------------------------------------------------------------------  RADIOLOGY:  No results found.  EKG:   Orders placed or performed during the hospital encounter of 08/21/16  . EKG 12-Lead  . EKG 12-Lead  . ED  EKG within 10 minutes  . ED EKG within 10 minutes  . EKG 12-Lead  . EKG 12-Lead    ASSESSMENT AND PLAN:   Chest pain, shortness of breath likely secondary to reactive airway disease And left lower lobe pneumonia. Continue IV Rocephin, Zithromax,changed nebs to xopenox. On oral Prednisone. ct chest with contrast can not be done due to kidney disease and renal dysfunction. Rpt Chest x-ray today. , #2/acute on chronic kidney disease  Stage 3; acute renal failure due to diuretics: Monitor kidney function, hold Lasix. The Lasix. Monitor kidney function closely. Obtain nephrology consult, vascular consult because patient had renal artery stent in March.  #3 .  Acute on chronic diastolic heart failure with elevated BNP. Received  IV Lasix for 3 days, stopped  yesterday because of worsening kidney function. 4. Essential hypertension, bradycardia. Episode 2 days ago.now has paroxysmal afib with RVR  #5. new onset A. Fib: continue  Po amiodarone, continue eliquis ,cardio following 6. History of renal artery  Stent  3 months ago, avascular, discontinue Plavix, continue only aspirin. #7.un controlled hypertension: Better better today. Patient is on clonidine, hydralazine, Coreg.  Possible discharge tomorrow if patient's renal function improves.   All the records are reviewed and case discussed with Care Management/Social Workerr. Management plans discussed with the patient, family and they are in agreement.  CODE STATUS:full  TOTAL TIME TAKING CARE OF THIS PATIENT: 35 minutes.   POSSIBLE D/C IN 1-2DAYS, DEPENDING ON CLINICAL CONDITION.   Epifanio Lesches M.D on 08/28/2016 at 3:16 PM  Between 7am to 6pm - Pager - [phone]  After 6pm go to [email or web address] - password [email or web address] [facility]  Office  [phone]  CC: Primary care physician; Dion Body, MD   Note: This dictation was prepared with Dragon dictation along with smaller phrase technology. Any  transcriptional errors that result from this process are unintentional.

## 2016-08-28 NOTE — Progress Notes (Signed)
Dr. Vianne Bulls made aware of pt's BP of 136/43, verbal order to hold Hydralazine for 1400

## 2016-08-29 ENCOUNTER — Inpatient Hospital Stay: Payer: Medicare Other

## 2016-08-29 DIAGNOSIS — R079 Chest pain, unspecified: Secondary | ICD-10-CM

## 2016-08-29 DIAGNOSIS — I5033 Acute on chronic diastolic (congestive) heart failure: Secondary | ICD-10-CM

## 2016-08-29 MED ORDER — AMIODARONE HCL 200 MG PO TABS
200.0000 mg | ORAL_TABLET | Freq: Two times a day (BID) | ORAL | Status: DC
  Administered 2016-08-29 – 2016-08-30 (×2): 200 mg via ORAL
  Filled 2016-08-29 (×2): qty 1

## 2016-08-29 NOTE — Progress Notes (Signed)
[facility] [place] NAME: Etienne Millward    MR#:  [id number]  DATE OF BIRTH:  [birth year]  Worsening  kidney function. No trouble urinating. No shortness of breath or cough. She denies any complaints.   CHIEF COMPLAINT:   Chief Complaint  Patient presents with  . Chest Pain    REVIEW OF SYSTEMS:   ROS CONSTITUTIONAL: No fever, fatigue or weakness.  EYES: No blurred or double vision.  EARS, NOSE, AND THROAT: No tinnitus or ear pain.  RESPIRATORY: cough And shortness of breath.  CARDIOVASCULAR:  No Chest pain  GASTROINTESTINAL: No nausea, vomiting, diarrhea or abdominal pain.  GENITOURINARY: No dysuria, hematuria.  ENDOCRINE: No polyuria, nocturia,  HEMATOLOGY: No anemia, easy bruising or bleeding SKIN: No rash or lesion. MUSCULOSKELETAL: No joint pain or arthritis.   NEUROLOGIC: No tingling, numbness, weakness.  PSYCHIATRY: No anxiety or depression.   DRUG ALLERGIES:   Allergies  Allergen Reactions  . Oxycodone Itching  . Codeine Nausea And Vomiting and Itching  . Hydrocodone-Acetaminophen Other (See Comments)    Hallucinates  . Iodine Swelling, Other (See Comments), Itching and Nausea Only    Patient is allergic to seafood and this is the cause why she can't take medication.  . Meperidine Hcl     Unknown reactions.  . Other Swelling    Seafood- tongue swelling and discoloration Patient allergic to seafood, tongue swelling and discoloration.  . Propoxyphene N-Acetaminophen Other (See Comments)    Unknown reaction    VITALS:  Blood pressure (!) 142/46, pulse 88, temperature 98.3 F (36.8 C), resp. rate 16, height 4\' 11"  (1.499 m), weight 55.9 kg (123 lb 4.8 oz), SpO2 96 %.  PHYSICAL EXAMINATION:  GENERAL:  83 y.o.-year-old patient lying in the bed with no acute distress.  EYES: Pupils equal, round, reactive to light ,No scleral icterus. Extraocular muscles intact.  HEENT: Head atraumatic, normocephalic.  Oropharynx and nasopharynx clear.  NECK:  Supple, no jugular venous distention. No thyroid enlargement, no tenderness.  LUNGS: Clear to auscultation, no wheezes, no rales.  CARDIOVASCULAR: S1, S2 regular. No murmurs, rubs, or gallops.  ABDOMEN: Soft, nontender, nondistended. Bowel sounds present. No organomegaly or mass.  EXTREMITIES: No pedal edema, cyanosis, or clubbing.  NEUROLOGIC: Cranial nerves II through XII are intact. Muscle strength 5/5 in all extremities. Sensation intact. Gait not checked.  PSYCHIATRIC: The patient is alert and oriented x 3.  SKIN: No obvious rash, lesion, or ulcer.    LABORATORY PANEL:   CBC  Recent Labs Lab 08/26/16 0357  WBC 7.9  HGB 11.3*  HCT 32.7*  PLT 360   ------------------------------------------------------------------------------------------------------------------  Chemistries   Recent Labs Lab 08/23/16 1038 08/24/16 0540  08/27/16 0530 08/28/16 0521  NA 135 137  --  133*  --   K 3.8 3.6  --  4.3  --   CL 104 102  --  98*  --   CO2 21* 26  --  27  --   GLUCOSE 178* 106*  --  105*  --   BUN 36* 40*  --  47*  --   CREATININE 1.84* 1.78*  < > 2.22* 2.28*  CALCIUM 8.9 9.0  --  9.1  --   MG 1.9  --   --   --   --   AST  --  24  --   --   --   ALT  --  17  --   --   --  ALKPHOS  --  79  --   --   --   BILITOT  --  0.5  --   --   --   < > = values in this interval not displayed. ------------------------------------------------------------------------------------------------------------------  Cardiac Enzymes No results for input(s): TROPONINI in the last 168 hours. ------------------------------------------------------------------------------------------------------------------  RADIOLOGY:  US Renal  Result Date: 08/29/2016 CLINICAL DATA:  Renal failure EXAM: RENAL / URINARY TRACT ULTRASOUND COMPLETE COMPARISON:  05/03/2016 FINDINGS: Right Kidney: Length: 8.2 cm. No hydronephrosis. Mildly increased echotexture. No mass. Left  Kidney: Length: 7.5 cm. No hydronephrosis. Mildly increased echotexture. No mass. Bladder: Appears normal for degree of bladder distention. IMPRESSION: Mildly atrophic kidneys bilaterally with increased echotexture compatible with chronic medical renal disease. No hydronephrosis. Electronically Signed   By: Rolm Baptise M.D.   On: 08/29/2016 09:22    EKG:   Orders placed or performed during the hospital encounter of 08/21/16  . EKG 12-Lead  . EKG 12-Lead  . ED EKG within 10 minutes  . ED EKG within 10 minutes  . EKG 12-Lead  . EKG 12-Lead    ASSESSMENT AND PLAN:   Chest pain, shortness of breath likely secondary to reactive airway disease And left lower lobe pneumonia. Continue IV Rocephin, Zithromax,changed nebs to xopenox. On oral Prednisone. ct chest with contrast can not be done due to kidney disease and renal dysfunction. Rpt Chest x-ray today. , #2/acute on chronic kidney disease  Stage 3; acute renal failure due to diuretics: Monitor kidney function, hold Lasix., Patient renal function is worsening, renal ultrasound revealed medical renal disease, no obstruction. obtain nephrology consult.   #3 .  Acute on chronic diastolic heart failure with elevated BNP. Received  IV Lasix for 3 days, stopped   because of worsening kidney function. 4. Essential hypertension, bradycardia. Episode 2 days ago.now has paroxysmal afib with RVR  #5. new onset A. Fib: continue  Po amiodarone, continue eliquis,heart rate controlled, heart rate is  58. Cardiology is following. 6. History of renal artery  Stent  3 months ago, seen by vascular, discontinue Plavix, continue only aspirin. #7.hypertension: Better better today. Patient is on clonidine, hydralazine, Coreg. . D/w RN  All the records are reviewed and case discussed with Care Management/Social Workerr. Management plans discussed with the patient, family and they are in agreement.  CODE STATUS:full  TOTAL TIME TAKING CARE OF THIS PATIENT: 35  minutes.   POSSIBLE D/C IN 1-2DAYS, DEPENDING ON CLINICAL CONDITION.   Epifanio Lesches M.D on 08/29/2016 at 11:46 AM  Between 7am to 6pm - Pager - [phone]  After 6pm go to [email or web address] - password [email or web address] [facility]  Office  [phone]  CC: Primary care physician; Dion Body, MD   Note: This dictation was prepared with Dragon dictation along with smaller phrase technology. Any transcriptional errors that result from this process are unintentional.

## 2016-08-29 NOTE — Progress Notes (Signed)
Patient Name: Joanne Michael Date of Encounter: 08/29/2016  Primary Cardiologist: [facility] Problem List     Principal Problem:   Acute respiratory distress Active Problems:   PNA (pneumonia)   Chest pain   New onset atrial fibrillation (HCC)   CKD (chronic kidney disease), stage III   Acute on chronic diastolic CHF (congestive heart failure) (HCC)   Atrial fibrillation with RVR ([place])     Subjective   Patient reports mild shortness of breath, overall much improved Long discussion concerning details from last Saturday approximately 5 days ago Had acute onset tachycardia, respiratory distress Prior to that had coughing concerning for bronchitis Cough has dramatically improved Lab work reviewed with her, creatinine climbing 2.28  Inpatient Medications    Scheduled Meds: . allopurinol  100 mg Oral BID  . amiodarone  400 mg Oral BID  . apixaban  2.5 mg Oral BID  . atorvastatin  20 mg Oral QHS  . calcium-vitamin D  1 tablet Oral Q breakfast  . carvedilol  3.125 mg Oral BID WC  . cloNIDine  0.1 mg Oral BID  . collagenase  1 application Topical Daily  . ferrous sulfate  325 mg Oral Q breakfast  . hydrALAZINE  50 mg Oral Q8H  . isosorbide mononitrate  30 mg Oral Daily  . levalbuterol  0.63 mg Nebulization Q8H  . loratadine  10 mg Oral Daily  . magnesium oxide  400 mg Oral Daily  . multivitamin with minerals  1 tablet Oral Daily  . pantoprazole  40 mg Oral QHS  . predniSONE  10 mg Oral Daily  . sodium chloride flush  3 mL Intravenous Q12H   Continuous Infusions:  PRN Meds: acetaminophen **OR** acetaminophen, fluticasone, nitroGLYCERIN, ondansetron **OR** ondansetron (ZOFRAN) IV   Vital Signs    Vitals:   08/29/16 0753 08/29/16 0945 08/29/16 1208 08/29/16 1530  BP:   (!) 157/42   Pulse:  88 71   Resp:   18   Temp:   97.6 F (36.4 C)   TempSrc:      SpO2: 96%  95% 96%  Weight:      Height:        Intake/Output Summary (Last 24 hours) at 08/29/16  1720 Last data filed at 08/29/16 1319  Gross per 24 hour  Intake              480 ml  Output             2450 ml  Net            -1970 ml   Filed Weights   08/21/16 1324 08/21/16 2117  Weight: 114 lb (51.7 kg) 123 lb 4.8 oz (55.9 kg)    Physical Exam    GEN: Well nourished, well developed, in no acute distress, mildly anxious  HEENT: Grossly normal.  Neck: Supple, no JVD, carotid bruits, or masses. Cardiac: RRR,  II/VI systolic murmur, no rubs, or gallops. No clubbing, cyanosis, edema.  Radials/DP/PT 2+ and equal bilaterally.  Respiratory:  Respirations regular and unlabored, clear to auscultation bilaterally. GI: Soft, nontender, nondistended, BS + x 4. MS: no deformity or atrophy. Skin: warm and dry, no rash. Neuro:  Strength and sensation are intact. Psych: AAOx3.  Normal affect.  Labs    CBC No results for input(s): WBC, NEUTROABS, HGB, HCT, MCV, PLT in the last 72 hours. Basic Metabolic Panel  Recent Labs  08/27/16 0530 08/28/16 0521  NA 133*  --  K 4.3  --   CL 98*  --   CO2 27  --   GLUCOSE 105*  --   BUN 47*  --   CREATININE 2.22* 2.28*  CALCIUM 9.1  --    Liver Function Tests No results for input(s): AST, ALT, ALKPHOS, BILITOT, PROT, ALBUMIN in the last 72 hours. No results for input(s): LIPASE, AMYLASE in the last 72 hours. Cardiac Enzymes No results for input(s): CKTOTAL, CKMB, CKMBINDEX, TROPONINI in the last 72 hours. BNP Invalid input(s): POCBNP D-Dimer No results for input(s): DDIMER in the last 72 hours. Hemoglobin A1C No results for input(s): HGBA1C in the last 72 hours. Fasting Lipid Panel No results for input(s): CHOL, HDL, LDLCALC, TRIG, CHOLHDL, LDLDIRECT in the last 72 hours. Thyroid Function Tests No results for input(s): TSH, T4TOTAL, T3FREE, THYROIDAB in the last 72 hours.  Invalid input(s): FREET3  Telemetry    NSR, 70s bpm - Personally Reviewed No change  ECG    n/a - Personally Reviewed  Radiology    US  Renal  Result Date: 08/29/2016 CLINICAL DATA:  Renal failure EXAM: RENAL / URINARY TRACT ULTRASOUND COMPLETE COMPARISON:  05/03/2016 FINDINGS: Right Kidney: Length: 8.2 cm. No hydronephrosis. Mildly increased echotexture. No mass. Left Kidney: Length: 7.5 cm. No hydronephrosis. Mildly increased echotexture. No mass. Bladder: Appears normal for degree of bladder distention. IMPRESSION: Mildly atrophic kidneys bilaterally with increased echotexture compatible with chronic medical renal disease. No hydronephrosis. Electronically Signed   By: Rolm Baptise M.D.   On: 08/29/2016 09:22    Cardiac Studies   TTE 07/31/2016: Study Conclusions  - Left ventricle: The cavity size was normal. Wall thickness was   increased in a pattern of moderate LVH. Systolic function was   vigorous. The estimated ejection fraction was in the range of 65%   to 70%. Wall motion was normal; there were no regional wall   motion abnormalities. Doppler parameters are consistent with   abnormal left ventricular relaxation (grade 1 diastolic   dysfunction). - Aortic valve: Probably trileaflet; moderately thickened leaflets.   Cusp separation was reduced. Transvalvular velocity was   increased. There was mild to moderate stenosis. There was mild to   moderate regurgitation. Valve area (VTI): 1.22 cm^2. Valve area   (Vmean): 1.02 cm^2. - Mitral valve: Mildly thickened leaflets . There was mild   regurgitation. - Right ventricle: The cavity size was normal. Wall thickness was   normal. Systolic function was normal.  Patient Profile     83 y.o. female with history of mild to moderate aortic stenosis by recent echocardiogram in early June 2018, CKDstage III, prior episode of near syncope in the setting of dehydration, mild carotid arterial disease, renal artery stenosis status post renal artery stenting in 04/2016 on Plavix at home, chronic lower extremity swelling that has previously been exacerbated by amlodipine, previously  poorly controlled hypertension now improved status post renal artery stenting, hyperlipidemia, and GERD who presented to [facility] with one-day history of pleuritic chest pain. During her admission she developed new onset Afib with RVR on 6/28 from 6:10-8:51 AM.   Assessment & Plan    1. PAF with RVR: No further atrial fibrillation this admission On anticoagulation, amiodarone We will decrease the amiodarone dose down to 200 mg twice a day Continue Coreg 3.125 mg bid  Eliquis 2.5 mg bid   2. HTN: On carvedilol 3.125 mill grams twice a day, clonidine 0.1 mg twice a day, hydralazine 50 mg 3 times a day, Imdur 30 If  pressure continues to run high, could increase hydralazine  3. Pleuritic chest pain: -Low risk VQ scan -Consider CXR  4. Acute on CKD stage III: -Status post recent renal artery stenting Suspect ATN Nephrology following   Total encounter time more than 25 minutes  Greater than 50% was spent in counseling and coordination of care with the patient   Signed, Signed, Esmond Plants, MD, Ph.D [facility] 08/29/2016, 5:20 PM

## 2016-08-29 NOTE — Progress Notes (Signed)
   08/29/16 1418  Clinical Encounter Type  Visited With Patient;Other (Comment);Health care provider  Visit Type Initial  Referral From [place] was rounding on unit and stopped by patients room. Patient was busy. Albers will come back at a later time.

## 2016-08-29 NOTE — Progress Notes (Signed)
[facility]  ROUNDING NOTE   Subjective:   [name] admitted to [facility] on 08/21/2016 for Hyponatremia [E87.1] Elevated troponin [R74.8] Chest pain, unspecified type [R07.9] Chronic kidney disease, unspecified CKD stage [N18.9]   Patient found to have new onset atrial fibrillation. Patient started on apixaban.   Nephrology consulted for acute renal failure on chronic kidney disease stage IV.   Family at bedside.   Objective:  Vital signs in last 24 hours:  Temp:  [97.6 F (36.4 C)-98.6 F (37 C)] 97.6 F (36.4 C) (07/04 1208) Pulse Rate:  [58-88] 71 (07/04 1208) Resp:  [16-18] 18 (07/04 1208) BP: (136-157)/(42-46) 157/42 (07/04 1208) SpO2:  [95 %-99 %] 95 % (07/04 1208)  Weight change:  Filed Weights   08/21/16 1324 08/21/16 2117  Weight: 51.7 kg (114 lb) 55.9 kg (123 lb 4.8 oz)    Intake/Output: I/O last 3 completed shifts: In: 773 [P.O.:720; I.V.:3; IV Piggyback:50] Out: 3901 [Urine:3900; Stool:1]   Intake/Output this shift:  Total I/O In: 240 [P.O.:240] Out: 700 [Urine:700]  Physical Exam: General: NAD, sitting in chair  Head: Normocephalic, atraumatic. Moist oral mucosal membranes  Eyes: Anicteric, PERRL  Neck: Supple, trachea midline  Lungs:  Clear to auscultation  Heart: irregular  Abdomen:  Soft, nontender,   Extremities: no peripheral edema.  Neurologic: Nonfocal, moving all four extremities  Skin: No lesions       Basic Metabolic Panel:  Recent Labs Lab 08/23/16 1038 08/24/16 0540 08/26/16 0357 08/27/16 0530 08/28/16 0521  NA 135 137  --  133*  --   K 3.8 3.6  --  4.3  --   CL 104 102  --  98*  --   CO2 21* 26  --  27  --   GLUCOSE 178* 106*  --  105*  --   BUN 36* 40*  --  47*  --   CREATININE 1.84* 1.78* 1.95* 2.22* 2.28*  CALCIUM 8.9 9.0  --  9.1  --   MG 1.9  --   --   --   --     Liver Function Tests:  Recent Labs Lab 08/24/16 0540  AST 24  ALT 17  ALKPHOS 79  BILITOT 0.5  PROT 6.6  ALBUMIN 3.1*    No results for input(s): LIPASE, AMYLASE in the last 168 hours. No results for input(s): AMMONIA in the last 168 hours.  CBC:  Recent Labs Lab 08/23/16 1038 08/26/16 0357  WBC 15.8* 7.9  HGB 10.0* 11.3*  HCT 29.4* 32.7*  MCV 91.8 92.6  PLT 249 360    Cardiac Enzymes: No results for input(s): CKTOTAL, CKMB, CKMBINDEX, TROPONINI in the last 168 hours.  BNP: Invalid input(s): POCBNP  CBG: No results for input(s): GLUCAP in the last 168 hours.  Microbiology: Results for orders placed or performed during the hospital encounter of 05/05/16  Culture, blood (routine x 2)     Status: None   Collection Time: 05/05/16  2:53 PM  Result Value Ref Range Status   Specimen Description BLOOD  RT HAND  Final   Special Requests BOTTLES DRAWN AEROBIC AND ANAEROBIC  [place]  Final   Culture NO GROWTH 5 DAYS  Final   Report Status 05/10/2016 FINAL  Final  Culture, blood (routine x 2)     Status: None   Collection Time: 05/05/16  2:58 PM  Result Value Ref Range Status   Specimen Description BLOOD  RT [facility]  Final   Special Requests BOTTLES DRAWN  AEROBIC AND ANAEROBIC  [place]  Final   Culture NO GROWTH 5 DAYS  Final   Report Status 05/10/2016 FINAL  Final    Coagulation Studies: No results for input(s): LABPROT, INR in the last 72 hours.  Urinalysis: No results for input(s): COLORURINE, LABSPEC, PHURINE, GLUCOSEU, HGBUR, BILIRUBINUR, KETONESUR, PROTEINUR, UROBILINOGEN, NITRITE, LEUKOCYTESUR in the last 72 hours.  Invalid input(s): APPERANCEUR    Imaging: US Renal  Result Date: 08/29/2016 CLINICAL DATA:  Renal failure EXAM: RENAL / URINARY TRACT ULTRASOUND COMPLETE COMPARISON:  05/03/2016 FINDINGS: Right Kidney: Length: 8.2 cm. No hydronephrosis. Mildly increased echotexture. No mass. Left Kidney: Length: 7.5 cm. No hydronephrosis. Mildly increased echotexture. No mass. Bladder: Appears normal for degree of bladder distention. IMPRESSION: Mildly atrophic kidneys bilaterally with increased  echotexture compatible with chronic medical renal disease. No hydronephrosis. Electronically Signed   By: Rolm Baptise M.D.   On: 08/29/2016 09:22     Medications:    . allopurinol  100 mg Oral BID  . amiodarone  400 mg Oral BID  . apixaban  2.5 mg Oral BID  . atorvastatin  20 mg Oral QHS  . calcium-vitamin D  1 tablet Oral Q breakfast  . carvedilol  3.125 mg Oral BID WC  . cloNIDine  0.1 mg Oral BID  . collagenase  1 application Topical Daily  . ferrous sulfate  325 mg Oral Q breakfast  . hydrALAZINE  50 mg Oral Q8H  . isosorbide mononitrate  30 mg Oral Daily  . levalbuterol  0.63 mg Nebulization Q8H  . loratadine  10 mg Oral Daily  . magnesium oxide  400 mg Oral Daily  . multivitamin with minerals  1 tablet Oral Daily  . pantoprazole  40 mg Oral QHS  . predniSONE  10 mg Oral Daily  . sodium chloride flush  3 mL Intravenous Q12H   acetaminophen **OR** acetaminophen, fluticasone, nitroGLYCERIN, ondansetron **OR** ondansetron (ZOFRAN) IV  Assessment/ Plan:  Joanne Michael is a 83 y.o. white female hypertension and degenerative joint disease, gout,  GERD, right renal artery angioplasty and stent placement March 2018  1. Acute renal failure on chronic kidney disease stage IV: baseline creatinine of 1.71, GFR of 25 on 05/08/16.  Chronic kidney disease secondary to renal artery stenosis, hypertension and acute renal failure with limited recovery.  Acute renal failure secondary to overdiuresis. Patient was given furosemide IV 40mg  from 6/27 to 7/2.  No indication for IV fluids.  Continue to renally dose all medications.  Avoid nephrotoxic agents.   2. Hypertension with new onset atrial fibrillation.  Echocardiogram 6/5 diastolic dysfunction with preserved systolic function.  - apixaban for anti-coagulation. Aspirin for renal artery stenosis and peripheral vascular disease - carvedilol for rate control - resume furosemide 20mg  three times a week - Continue clonidine and  hydralazine.   3. Anemia with chronic kidney disease and iron deficiency. Hemoglobin 11.3.    LOS: [place], Tavaris Eudy 7/4/20181:05 PM

## 2016-08-30 DIAGNOSIS — R0781 Pleurodynia: Secondary | ICD-10-CM

## 2016-08-30 LAB — BASIC METABOLIC PANEL
Anion gap: 11 (ref 5–15)
BUN: 48 mg/dL — AB (ref 6–20)
CHLORIDE: 96 mmol/L — AB (ref 101–111)
CO2: 22 mmol/L (ref 22–32)
CREATININE: 1.8 mg/dL — AB (ref 0.44–1.00)
Calcium: 9.2 mg/dL (ref 8.9–10.3)
GFR calc Af Amer: 28 mL/min — ABNORMAL LOW (ref >60)
GFR calc non Af Amer: 24 mL/min — ABNORMAL LOW (ref >60)
GLUCOSE: 115 mg/dL — AB (ref 65–99)
POTASSIUM: 4.9 mmol/L (ref 3.5–5.1)
SODIUM: 129 mmol/L — AB (ref 135–145)

## 2016-08-30 LAB — CBC
HEMATOCRIT: 28.6 % — AB (ref 35.0–47.0)
Hemoglobin: 9.7 g/dL — ABNORMAL LOW (ref 12.0–16.0)
MCH: 31.5 pg (ref 26.0–34.0)
MCHC: 34 g/dL (ref 32.0–36.0)
MCV: 92.6 fL (ref 80.0–100.0)
Platelets: 483 10*3/uL — ABNORMAL HIGH (ref 150–440)
RBC: 3.08 MIL/uL — ABNORMAL LOW (ref 3.80–5.20)
RDW: 14 % (ref 11.5–14.5)
WBC: 12.6 10*3/uL — AB (ref 3.6–11.0)

## 2016-08-30 MED ORDER — APIXABAN 2.5 MG PO TABS: 2.5000 mg | ORAL_TABLET | Freq: Two times a day (BID) | ORAL | 0 refills | Status: DC

## 2016-08-30 MED ORDER — ISOSORBIDE MONONITRATE ER 30 MG PO TB24: 30.0000 mg | ORAL_TABLET | Freq: Every day | ORAL | 0 refills | Status: AC

## 2016-08-30 MED ORDER — CLONIDINE HCL 0.1 MG PO TABS: 0.1000 mg | ORAL_TABLET | Freq: Two times a day (BID) | ORAL | 11 refills | Status: AC

## 2016-08-30 MED ORDER — HYDRALAZINE HCL 50 MG PO TABS: 75.0000 mg | ORAL_TABLET | Freq: Three times a day (TID) | ORAL | Status: DC

## 2016-08-30 MED ORDER — PREDNISONE 10 MG PO TABS: 10.0000 mg | ORAL_TABLET | Freq: Every day | ORAL | 0 refills | Status: DC

## 2016-08-30 MED ORDER — AMIODARONE HCL 200 MG PO TABS: 200.0000 mg | ORAL_TABLET | Freq: Two times a day (BID) | ORAL | 0 refills | Status: DC

## 2016-08-30 MED ORDER — HYDRALAZINE HCL 25 MG PO TABS: 75.0000 mg | ORAL_TABLET | Freq: Three times a day (TID) | ORAL | 0 refills | Status: AC

## 2016-08-30 NOTE — Care Management (Signed)
Patient is agreeable to have home health nurse follow up to monitor cardiopulmonary status.  Agency preference is [employer].  Patient says she has ambulated in the halls.  she is not requiring oxygen.

## 2016-08-30 NOTE — Care Management Important Message (Signed)
Important Message  Patient Details  Name: Joanne Michael MRN: [id number] Date of Birth: 03-15-1924   Medicare Important Message Given:  Yes Signed IM notice given    Katrina Stack, RN 08/30/2016, 11:43 AM

## 2016-08-30 NOTE — Care Management (Addendum)
Provided with coupon for 30 day trial Eliquis and discussed mobility and possible need for home health.

## 2016-08-30 NOTE — Progress Notes (Signed)
[facility]  ROUNDING NOTE   Subjective:   Patient laying in bed. No complaints. No edema or shortness of breath.   Objective:  Vital signs in last 24 hours:  Temp:  [97.6 F (36.4 C)-98.3 F (36.8 C)] 98.1 F (36.7 C) (07/05 0542) Pulse Rate:  [57-71] 57 (07/05 0542) Resp:  [18-22] 22 (07/05 0542) BP: (150-158)/(41-46) 150/46 (07/05 0542) SpO2:  [95 %-98 %] 96 % (07/05 0758)  Weight change:  Filed Weights   08/21/16 1324 08/21/16 2117  Weight: 51.7 kg (114 lb) 55.9 kg (123 lb 4.8 oz)    Intake/Output: I/O last 3 completed shifts: In: 240 [P.O.:240] Out: 3050 [Urine:3050]   Intake/Output this shift:  No intake/output data recorded.  Physical Exam: General: NAD   Head: Normocephalic, atraumatic. Moist oral mucosal membranes  Eyes: Anicteric, PERRL  Neck: Supple, trachea midline  Lungs:  Clear to auscultation  Heart: irregular  Abdomen:  Soft, nontender,   Extremities: no peripheral edema.  Neurologic: Nonfocal, moving all four extremities  Skin: No lesions       Basic Metabolic Panel:  Recent Labs Lab 08/23/16 1038 08/24/16 0540 08/26/16 0357 08/27/16 0530 08/28/16 0521 08/30/16 0502  NA 135 137  --  133*  --  129*  K 3.8 3.6  --  4.3  --  4.9  CL 104 102  --  98*  --  96*  CO2 21* 26  --  27  --  22  GLUCOSE 178* 106*  --  105*  --  115*  BUN 36* 40*  --  47*  --  48*  CREATININE 1.84* 1.78* 1.95* 2.22* 2.28* 1.80*  CALCIUM 8.9 9.0  --  9.1  --  9.2  MG 1.9  --   --   --   --   --     Liver Function Tests:  Recent Labs Lab 08/24/16 0540  AST 24  ALT 17  ALKPHOS 79  BILITOT 0.5  PROT 6.6  ALBUMIN 3.1*   No results for input(s): LIPASE, AMYLASE in the last 168 hours. No results for input(s): AMMONIA in the last 168 hours.  CBC:  Recent Labs Lab 08/23/16 1038 08/26/16 0357 08/30/16 0502  WBC 15.8* 7.9 12.6*  HGB 10.0* 11.3* 9.7*  HCT 29.4* 32.7* 28.6*  MCV 91.8 92.6 92.6  PLT 249 360 483*    Cardiac  Enzymes: No results for input(s): CKTOTAL, CKMB, CKMBINDEX, TROPONINI in the last 168 hours.  BNP: Invalid input(s): POCBNP  CBG: No results for input(s): GLUCAP in the last 168 hours.  Microbiology: Results for orders placed or performed during the hospital encounter of 05/05/16  Culture, blood (routine x 2)     Status: None   Collection Time: 05/05/16  2:53 PM  Result Value Ref Range Status   Specimen Description BLOOD  RT HAND  Final   Special Requests BOTTLES DRAWN AEROBIC AND ANAEROBIC  [place]  Final   Culture NO GROWTH 5 DAYS  Final   Report Status 05/10/2016 FINAL  Final  Culture, blood (routine x 2)     Status: None   Collection Time: 05/05/16  2:58 PM  Result Value Ref Range Status   Specimen Description BLOOD  RT AC  Final   Special Requests BOTTLES DRAWN AEROBIC AND ANAEROBIC  [place]  Final   Culture NO GROWTH 5 DAYS  Final   Report Status 05/10/2016 FINAL  Final    Coagulation Studies: No results for input(s): LABPROT, INR in the last  72 hours.  Urinalysis: No results for input(s): COLORURINE, LABSPEC, PHURINE, GLUCOSEU, HGBUR, BILIRUBINUR, KETONESUR, PROTEINUR, UROBILINOGEN, NITRITE, LEUKOCYTESUR in the last 72 hours.  Invalid input(s): APPERANCEUR    Imaging: US Renal  Result Date: 08/29/2016 CLINICAL DATA:  Renal failure EXAM: RENAL / URINARY TRACT ULTRASOUND COMPLETE COMPARISON:  05/03/2016 FINDINGS: Right Kidney: Length: 8.2 cm. No hydronephrosis. Mildly increased echotexture. No mass. Left Kidney: Length: 7.5 cm. No hydronephrosis. Mildly increased echotexture. No mass. Bladder: Appears normal for degree of bladder distention. IMPRESSION: Mildly atrophic kidneys bilaterally with increased echotexture compatible with chronic medical renal disease. No hydronephrosis. Electronically Signed   By: Rolm Baptise M.D.   On: 08/29/2016 09:22     Medications:    . allopurinol  100 mg Oral BID  . amiodarone  200 mg Oral BID  . apixaban  2.5 mg Oral BID  .  atorvastatin  20 mg Oral QHS  . calcium-vitamin D  1 tablet Oral Q breakfast  . carvedilol  3.125 mg Oral BID WC  . cloNIDine  0.1 mg Oral BID  . collagenase  1 application Topical Daily  . ferrous sulfate  325 mg Oral Q breakfast  . hydrALAZINE  50 mg Oral Q8H  . isosorbide mononitrate  30 mg Oral Daily  . levalbuterol  0.63 mg Nebulization Q8H  . loratadine  10 mg Oral Daily  . magnesium oxide  400 mg Oral Daily  . multivitamin with minerals  1 tablet Oral Daily  . pantoprazole  40 mg Oral QHS  . predniSONE  10 mg Oral Daily  . sodium chloride flush  3 mL Intravenous Q12H   acetaminophen **OR** acetaminophen, fluticasone, nitroGLYCERIN, ondansetron **OR** ondansetron (ZOFRAN) IV  Assessment/ Plan:  [name] is a 83 y.o. white female hypertension and degenerative joint disease, gout,  GERD, right renal artery angioplasty and stent placement March 2018  1. Acute renal failure on chronic kidney disease stage IV: baseline creatinine of 1.71, GFR of 25 on 05/08/16.  Chronic kidney disease secondary to renal artery stenosis, hypertension and acute renal failure with limited recovery.  Acute renal failure secondary to overdiuresis. Patient was given furosemide IV 40mg  from 6/27 to 7/2.  Creatinine back to baseline.  Continue to renally dose all medications.  Avoid nephrotoxic agents.   2. Hyponatremia: Na 129 - restart fluid restriction 1292mL/day  3. Hypertension with new onset atrial fibrillation.  Echocardiogram 6/5 diastolic dysfunction with preserved systolic function.  - apixaban for anti-coagulation. Aspirin for renal artery stenosis and peripheral vascular disease - carvedilol for rate control - furosemide 20mg  three times a week - Continue clonidine and hydralazine.  - Appreciate cards input  4. Anemia with chronic kidney disease and iron deficiency. Hemoglobin at goal.    LOS: [place], [place] 7/5/201810:09 AM

## 2016-08-30 NOTE — Progress Notes (Signed)
Patient Name: Joanne Michael Date of Encounter: 08/30/2016  Primary Cardiologist: [facility] Problem List     Principal Problem:   Acute respiratory distress Active Problems:   PNA (pneumonia)   Chest pain   New onset atrial fibrillation (HCC)   CKD (chronic kidney disease), stage III   Acute on chronic diastolic CHF (congestive heart failure) (HCC)   Atrial fibrillation with RVR ([place])     Subjective   Patient reports continued coughing with occasional production of clear phlegm.  She reports improvement in her SOB.  She describes chest and rib pain that she feels may be 2/2 her persistent coughing.  She denies feeling any rapid beating of her heart or skipped heart beats. She also denies n/v/d, abdominal pain, and headache. Patient is very talkative today and eager to go home.   Inpatient Medications    Scheduled Meds: . allopurinol  100 mg Oral BID  . amiodarone  200 mg Oral BID  . apixaban  2.5 mg Oral BID  . atorvastatin  20 mg Oral QHS  . calcium-vitamin D  1 tablet Oral Q breakfast  . carvedilol  3.125 mg Oral BID WC  . cloNIDine  0.1 mg Oral BID  . collagenase  1 application Topical Daily  . ferrous sulfate  325 mg Oral Q breakfast  . hydrALAZINE  50 mg Oral Q8H  . isosorbide mononitrate  30 mg Oral Daily  . levalbuterol  0.63 mg Nebulization Q8H  . loratadine  10 mg Oral Daily  . magnesium oxide  400 mg Oral Daily  . multivitamin with minerals  1 tablet Oral Daily  . pantoprazole  40 mg Oral QHS  . predniSONE  10 mg Oral Daily  . sodium chloride flush  3 mL Intravenous Q12H   Continuous Infusions:  PRN Meds: acetaminophen **OR** acetaminophen, fluticasone, nitroGLYCERIN, ondansetron **OR** ondansetron (ZOFRAN) IV   Vital Signs    Vitals:   08/29/16 2016 08/29/16 2300 08/30/16 0542 08/30/16 0758  BP: (!) 158/41  (!) 150/46   Pulse: 65  (!) 57   Resp: 20  (!) 22   Temp: 98.3 F (36.8 C)  98.1 F (36.7 C)   TempSrc: Oral  Oral   SpO2:  98% 97% 97% 96%  Weight:      Height:        Intake/Output Summary (Last 24 hours) at 08/30/16 0957 Last data filed at 08/30/16 0053  Gross per 24 hour  Intake                0 ml  Output              900 ml  Net             -900 ml   Filed Weights   08/21/16 1324 08/21/16 2117  Weight: 114 lb (51.7 kg) 123 lb 4.8 oz (55.9 kg)    Physical Exam    GEN: Well nourished, well developed, in no acute distress.   HEENT: Grossly normal.  Neck: Supple, no JVD, carotid bruits, or masses. Cardiac: RRR,  II/VI systolic murmur, no rubs, or gallops. No clubbing, cyanosis, edema.  Radials/DP/PT 2+ and equal bilaterally.  Respiratory:  Respirations regular and unlabored. Lungs with bilateral rhonchi on auscultation. GI: Soft, nontender, nondistended, BS + x 4. MS: no deformity or atrophy. Skin: warm and dry, no rash. Neuro:  Strength and sensation are intact. Psych: AAOx3.  Normal affect.  Labs    CBC  Recent Labs  08/30/16 0502  WBC 12.6*  HGB 9.7*  HCT 28.6*  MCV 92.6  PLT 510*   Basic Metabolic Panel  Recent Labs  08/28/16 0521 08/30/16 0502  NA  --  129*  K  --  4.9  CL  --  96*  CO2  --  22  GLUCOSE  --  115*  BUN  --  48*  CREATININE 2.28* 1.80*  CALCIUM  --  9.2   Liver Function Tests No results for input(s): AST, ALT, ALKPHOS, BILITOT, PROT, ALBUMIN in the last 72 hours. No results for input(s): LIPASE, AMYLASE in the last 72 hours. Cardiac Enzymes No results for input(s): CKTOTAL, CKMB, CKMBINDEX, TROPONINI in the last 72 hours. BNP Invalid input(s): POCBNP D-Dimer No results for input(s): DDIMER in the last 72 hours. Hemoglobin A1C No results for input(s): HGBA1C in the last 72 hours. Fasting Lipid Panel No results for input(s): CHOL, HDL, LDLCALC, TRIG, CHOLHDL, LDLDIRECT in the last 72 hours. Thyroid Function Tests No results for input(s): TSH, T4TOTAL, T3FREE, THYROIDAB in the last 72 hours.  Invalid input(s): FREET3  Telemetry    NSR, 50-100  bpm - Personally Reviewed No change  ECG    n/a - Personally Reviewed  Radiology    US Renal  Result Date: 08/29/2016 CLINICAL DATA:  Renal failure EXAM: RENAL / URINARY TRACT ULTRASOUND COMPLETE COMPARISON:  05/03/2016 FINDINGS: Right Kidney: Length: 8.2 cm. No hydronephrosis. Mildly increased echotexture. No mass. Left Kidney: Length: 7.5 cm. No hydronephrosis. Mildly increased echotexture. No mass. Bladder: Appears normal for degree of bladder distention. IMPRESSION: Mildly atrophic kidneys bilaterally with increased echotexture compatible with chronic medical renal disease. No hydronephrosis. Electronically Signed   By: Rolm Baptise M.D.   On: 08/29/2016 09:22    Cardiac Studies   TTE 07/31/2016: Study Conclusions  - Left ventricle: The cavity size was normal. Wall thickness was   increased in a pattern of moderate LVH. Systolic function was   vigorous. The estimated ejection fraction was in the range of 65%   to 70%. Wall motion was normal; there were no regional wall   motion abnormalities. Doppler parameters are consistent with   abnormal left ventricular relaxation (grade 1 diastolic   dysfunction). - Aortic valve: Probably trileaflet; moderately thickened leaflets.   Cusp separation was reduced. Transvalvular velocity was   increased. There was mild to moderate stenosis. There was mild to   moderate regurgitation. Valve area (VTI): 1.22 cm^2. Valve area   (Vmean): 1.02 cm^2. - Mitral valve: Mildly thickened leaflets . There was mild   regurgitation. - Right ventricle: The cavity size was normal. Wall thickness was   normal. Systolic function was normal.  Patient Profile     83 y.o. female with history of mild to moderate aortic stenosis by recent echocardiogram in early June 2018, CKDstage III, prior episode of near syncope in the setting of dehydration, mild carotid arterial disease, renal artery stenosis status post renal artery stenting in 04/2016 on Plavix at home,  chronic lower extremity swelling that has previously been exacerbated by amlodipine, previously poorly controlled hypertension now improved status post renal artery stenting, hyperlipidemia, and GERD who presented to [facility] with one-day history of pleuritic chest pain. During her admission she developed new onset Afib with RVR on 6/28 from 6:10-8:51 AM.   Assessment & Plan    1. PAF with RVR: - No further atrial fibrillation this admission following chemical cardioversion on 6/30. - On anticoagulation, Coreg 3.125mg  BID and Amiodarone  200mg  BID (reduce to 200mg  daily within the next 2 weeks). Will not further titrate BB at this time in the setting of variable HR (50's to low-100's).  - Continue Eliquis 2.5 mg bid due to age, weight, and current creatinine. 7/5 creatinine 2.28 - 6/26 US Venous lower bilateral study show no evidence of DVT bilaterally.  2. HTN: - Continue reduced dose of clonidine 0.1 BID, Coreg 3.125mg  BID and Imdur 30mg  daily. Will titrate Hydralazine to 75mg  TID.   3. Pleuritic chest pain: - Low risk VQ scan  - 6/27 NM Pulm Perf and Vent Low probability for pulmonary embolus. Very poor ventilation suggesting COPD. - CXR 6/28 shows persistent opacity at the left lung base consistent with effusion, atelectasis and possibly pneumonia. However in view of the persistence of this opacity, CT of the chest is recommended to exclude underlying neoplasm.  4. Acute on CKD stage III: -Status post recent renal artery stenting - Suspect ATN - Nephrology following - 7/4 US renal shows mildly atrophic kidneys bilaterally with increased echotexture compatible with chronic medical renal disease. No hydronephrosis. - BUN 48, SCr 1.8. GFR 24 (7/5). Sodium 129, Chloride 96.   - On discharge, lasix three times a week per Nephrology.  5. Anemia, iron deficiency - 7/5 CBC with RBC 3.08, Hbg 9.7, HCT 28.6, plts 483  6. Remaining managed per IM    Signed, Signed, Marrianne Mood, Student-PA   08/30/2016 10:16 AM   Reviewed and discussed the above patient during rounds. Changes made where necessary.   Signed, Erma Heritage, PA-C 08/30/2016, 10:44 AM Pager: [phone]

## 2016-08-30 NOTE — Discharge Instructions (Signed)
Heart Failure Clinic appointment on September 12, 2016 at 9:20am with Darylene Price, [place]. Please call [phone] to reschedule.

## 2016-08-30 NOTE — Progress Notes (Signed)
[facility] NAME: Joanne Michael    MR#:  [id number]  DATE OF BIRTH:  19-Feb-1925  Renal function,  Improved.stable for discharge.   CHIEF COMPLAINT:   Chief Complaint  Patient presents with  . Chest Pain    REVIEW OF SYSTEMS:   ROS CONSTITUTIONAL: No fever, fatigue or weakness.  EYES: No blurred or double vision.  EARS, NOSE, AND THROAT: No tinnitus or ear pain.  RESPIRATORY: cough And shortness of breath.  CARDIOVASCULAR:  No Chest pain  GASTROINTESTINAL: No nausea, vomiting, diarrhea or abdominal pain.  GENITOURINARY: No dysuria, hematuria.  ENDOCRINE: No polyuria, nocturia,  HEMATOLOGY: No anemia, easy bruising or bleeding SKIN: No rash or lesion. MUSCULOSKELETAL: No joint pain or arthritis.   NEUROLOGIC: No tingling, numbness, weakness.  PSYCHIATRY: No anxiety or depression.   DRUG ALLERGIES:   Allergies  Allergen Reactions  . Oxycodone Itching  . Codeine Nausea And Vomiting and Itching  . Hydrocodone-Acetaminophen Other (See Comments)    Hallucinates  . Iodine Swelling, Other (See Comments), Itching and Nausea Only    Patient is allergic to seafood and this is the cause why she can't take medication.  . Meperidine Hcl     Unknown reactions.  . Other Swelling    Seafood- tongue swelling and discoloration Patient allergic to seafood, tongue swelling and discoloration.  . Propoxyphene N-Acetaminophen Other (See Comments)    Unknown reaction    VITALS:  Blood pressure (!) 150/46, pulse (!) 57, temperature 98.1 F (36.7 C), temperature source Oral, resp. rate (!) 22, height 4\' 11"  (1.499 m), weight 55.9 kg (123 lb 4.8 oz), SpO2 96 %.  PHYSICAL EXAMINATION:  GENERAL:  83 y.o.-year-old patient lying in the bed with no acute distress.  EYES: Pupils equal, round, reactive to light ,No scleral icterus. Extraocular muscles intact.  HEENT: Head atraumatic, normocephalic. Oropharynx and nasopharynx clear.   NECK:  Supple, no jugular venous distention. No thyroid enlargement, no tenderness.  LUNGS: Clear to auscultation, no wheezes, no rales.  CARDIOVASCULAR: S1, S2 regular. No murmurs, rubs, or gallops.  ABDOMEN: Soft, nontender, nondistended. Bowel sounds present. No organomegaly or mass.  EXTREMITIES: No pedal edema, cyanosis, or clubbing.  NEUROLOGIC: Cranial nerves II through XII are intact. Muscle strength 5/5 in all extremities. Sensation intact. Gait not checked.  PSYCHIATRIC: The patient is alert and oriented x 3.  SKIN: No obvious rash, lesion, or ulcer.    LABORATORY PANEL:   CBC  Recent Labs Lab 08/30/16 0502  WBC 12.6*  HGB 9.7*  HCT 28.6*  PLT 483*   ------------------------------------------------------------------------------------------------------------------  Chemistries   Recent Labs Lab 08/24/16 0540  08/30/16 0502  NA 137  < > 129*  K 3.6  < > 4.9  CL 102  < > 96*  CO2 26  < > 22  GLUCOSE 106*  < > 115*  BUN 40*  < > 48*  CREATININE 1.78*  < > 1.80*  CALCIUM 9.0  < > 9.2  AST 24  --   --   ALT 17  --   --   ALKPHOS 79  --   --   BILITOT 0.5  --   --   < > = values in this interval not displayed. ------------------------------------------------------------------------------------------------------------------  Cardiac Enzymes No results for input(s): TROPONINI in the last 168 hours. ------------------------------------------------------------------------------------------------------------------  RADIOLOGY:  US Renal  Result Date: 08/29/2016 CLINICAL DATA:  Renal failure EXAM: RENAL / URINARY TRACT ULTRASOUND COMPLETE COMPARISON:  05/03/2016 FINDINGS: Right Kidney: Length: 8.2 cm. No hydronephrosis. Mildly increased echotexture. No mass. Left Kidney: Length: 7.5 cm. No hydronephrosis. Mildly increased echotexture. No mass. Bladder: Appears normal for degree of bladder distention. IMPRESSION: Mildly atrophic kidneys bilaterally with increased  echotexture compatible with chronic medical renal disease. No hydronephrosis. Electronically Signed   By: Rolm Baptise M.D.   On: 08/29/2016 09:22    EKG:   Orders placed or performed during the hospital encounter of 08/21/16  . EKG 12-Lead  . EKG 12-Lead  . ED EKG within 10 minutes  . ED EKG within 10 minutes  . EKG 12-Lead  . EKG 12-Lead    ASSESSMENT AND PLAN:   Chest pain, shortness of breath likely secondary to reactive airway disease And left lower lobe pneumonia. Finished treatment for pneumonia.. , #2/acute on chronic kidney disease  Stage 3; acute renal failure due to diuretics: Function is improving, nephrology recommended to discharge the patient on Lasix 3 times a week, fluid restriction 1.2 L per 24 hours, discussed this with patient, patient is stable for discharge, dischargethe patient home today.  #3 .  Acute on chronic diastolic heart failure with elevated BNP. Received  IV Lasix for 3 days, stopped   because of worsening kidney function.  4. Essential hypertension, bradycardia. Episode 2 days ago.now has paroxysmal afib with RVR  #5. new onset A. Fib: adjusted amio dosing due to relative brady ,eliquis at lower dose . 6. History of renal artery  Stent  3 months ago, seen by vascular, discontinue Plavix, continue only aspirin. #7.hypertension: Better better today. Patient is on clonidine, hydralazine, Coreg. . D/w RN  All the records are reviewed and case discussed with Care Management/Social Workerr. Management plans discussed with the patient, family and they are in agreement.  CODE STATUS:full  TOTAL TIME TAKING CARE OF THIS PATIENT: 35 minutes.   POSSIBLE D/C IN 1-2DAYS, DEPENDING ON CLINICAL CONDITION.   Epifanio Lesches M.D on 08/30/2016 at 1:05 PM  Between 7am to 6pm - Pager - [phone]  After 6pm go to [email or web address] - password [email or web address] [facility]  Office  [phone]  CC: Primary care physician; Dion Body,  MD   Note: This dictation was prepared with Dragon dictation along with smaller phrase technology. Any transcriptional errors that result from this process are unintentional.

## 2016-08-30 NOTE — Plan of Care (Signed)
Problem: Activity: Goal: Risk for activity intolerance will decrease Outcome: Progressing Ambulated in hallway earlier this shift without respiratory distress

## 2016-09-01 NOTE — Discharge Summary (Signed)
Joanne Michael, is a 83 y.o. female  DOB [birth year]  MRN [id number].  Admission date:  08/21/2016  Admitting Physician  Henreitta Leber, MD  Discharge Date:  09/01/2016   Primary MD  Dion Body, MD  Recommendations for primary care physician for things to follow:   Follow-up with Dr. Juleen China in 2 weeks Follow-up with PCP in 2 weeks. Follow with  cardiology from [facility] in 2 weeks.   Admission Diagnosis  Hyponatremia [E87.1] Elevated troponin [R74.8] Chest pain, unspecified type [R07.9] Chronic kidney disease, unspecified CKD stage [N18.9]   Discharge Diagnosis  Hyponatremia [E87.1] Elevated troponin [R74.8] Chest pain, unspecified type [R07.9] Chronic kidney disease, unspecified CKD stage [N18.9]    Principal Problem:   Acute respiratory distress Active Problems:   PNA (pneumonia)   Chest pain   New onset atrial fibrillation (HCC)   CKD (chronic kidney disease), stage III   Acute on chronic diastolic CHF (congestive heart failure) (HCC)   Atrial fibrillation with RVR ([facility])      Past Medical History:  Diagnosis Date  . Arthritis   . Cancer ([facility])    melanoma  . Cat allergies   . Chronic renal failure   . Fatigue   . GERD (gastroesophageal reflux disease)   . Gout   . H/O Clostridium difficile infection   . Hay fever    as child  . Hiatal hernia   . HLD (hyperlipidemia)   . HTN (hypertension)   . Iron deficiency anemia   . Spinal stenosis     Past Surgical History:  Procedure Laterality Date  . APPENDECTOMY    . BACK SURGERY    . CATARACT EXTRACTION    . CHOLECYSTECTOMY    . feet surgery    . HERNIA REPAIR    . knee replacement- both    . MELANOMA EXCISION    . NOSE SURGERY    . RENAL ANGIOGRAPHY N/A 05/07/2016   Procedure: Renal Angiography;  Surgeon: Algernon Huxley, MD;   Location: [facility];  Service: Cardiovascular;  Laterality: N/A;  . ROTATOR CUFF REPAIR     left  . SHOULDER SURGERY    . TONSILLECTOMY    . TOTAL ABDOMINAL HYSTERECTOMY         History of present illness and  Hospital Course:     Kindly see H&P for history of present illness and admission details, please review complete Labs, Consult reports and Test reports for all details in brief  HPI  from the history and physical done on the day of admission 83 year old female patient with history of diabetes, CK disease  3, hyperlipidemia, GERD admitted for chest pain, and shortness of breath. Also had cramping and likes but DVT study negative. Found to have mildly elevated troponins.   Hospital Course   #1 chest pain: Initially thought to have noncardiac chest pain, cycle the troponins 3 sets are negative. Patient  VQ scan showed low probability for PE. Unable to do CT chest because of chronic kidney disease. Patient had left lower lobe pneumonia, finished her treatment for that. Chest pain resolved at the time of discharge.   #2 acute on chronic kidney disease stage III: Acute renal failure secondary to diuretics. Patient was given IV furosemide 40 mg from June 27 to July 2 because of shortness of breath. Patient has C [place] stage IV at baseline, seen by, patient creatinine continued to worsen and reached 2.28, baseline creatinine is 1.7. After holding her diuretics  patient's creatinine decreased to 1.80 on July 5. Patient discharged home with the Lasix 20 mg three times week nephrology instructions.\ She has acute on chronic kidney disease stage IV, history of renal artery stenting 3 months ago, was taking Plavix at home consulted Dr. dew who recommended discontinue Plavix and continue full dose anticoagulation to help with A. fib as well.   newonset atrial fibrillation: Seen by cardiology, started on amiodarone,`Coreg 3.125 mg bid  Eliquis 2.5 mg bid , Rocephin amiodarone has been  reduced because of bradycardia.   HTN: On carvedilol 3.125 mill grams twice a day, clonidine 0.1 mg twice a day, hydralazine 50 mg 3 times a day, Imdur 30  Discharge Condition: stable   Follow UP  Follow-up Information    [facility]. Go on 09/12/2016.   Specialty:  Cardiology Why:  at 9:20am  Contact information: [address] [place], [place].   Specialty:  Home Health Services Why:  Home health nurse.  Will  make initial visit 7/9. Contact information: [place] [address] [phone]        Dion Body, MD In 1 week.   Specialty:  Family Medicine Why:  office will call to make this appointment! thank you! Contact information: [place] [address] [phone]        Wellington Hampshire, MD In 2 weeks.   Specialty:  Cardiology Why:  Please follow up with Dr. Rockey Situ on Monday July 23rd at 8:40a Contact information: [place] [address] [phone]        Lavonia Dana, MD In 1 week.   Specialty:  Internal Medicine Why:  Please call office to make this appointment! Thank you! jb Contact information: [address] [place]  [address] [phone]             Discharge Instructions  and  Discharge Medications     Discharge Instructions    Discharge instructions    Complete by:  As directed    Fluid restriction up to 1.2 L a day.   Face-to-face encounter (required for Medicare/Medicaid patients)    Complete by:  As directed    I Mikai Meints certify that this patient is under my care and that I, or a nurse practitioner or physician's assistant working with me, had a face-to-face encounter that meets the physician face-to-face encounter requirements with this patient on 08/30/2016.  The encounter with the patient was in whole, or in part for the following medical condition(s) which is the primary reason for home health care (List medical condition): Atrial fibrillation with RVR Pneumonia Acute on chronic kidney disease stage III   The encounter with the patient was in whole, or in part, for the following medical condition, which is the primary reason for home health care:  whole   I certify that, based on my findings, the following services are medically necessary home health services:  Nursing   Reason for Medically Necessary Home Health Services:  Skilled Nursing- Lab Monitoring Requiring Frequent Medication Adjustment   My clinical findings support the need for the above services:  Unable to leave home safely without assistance and/or assistive device   Further, I certify that my clinical findings support that this patient is homebound due to:  Unable to leave home safely without assistance  Home Health    Complete by:  As directed    To provide the following care/treatments:  RN     Allergies as of 08/30/2016      Reactions   Oxycodone Itching   Codeine Nausea And Vomiting, Itching   Hydrocodone-acetaminophen Other (See Comments)   Hallucinates   Iodine Swelling, Other (See Comments), Itching, Nausea Only   Patient is allergic to seafood and this is the cause why she can't take medication.   Meperidine Hcl    Unknown reactions.   Other Swelling   Seafood- tongue swelling and discoloration Patient allergic to seafood, tongue swelling and discoloration.   Propoxyphene N-acetaminophen Other (See Comments)   Unknown reaction      Medication List    TAKE these medications   albuterol 108 (90 Base) MCG/ACT inhaler Commonly known as:  PROVENTIL HFA;VENTOLIN HFA Inhale 1-2 puffs into the lungs every 6 (six) hours as needed for wheezing or shortness of breath.   allopurinol 100 MG tablet Commonly known as:  ZYLOPRIM Take 100 mg by mouth 2 (two) times daily.    amiodarone 200 MG tablet Commonly known as:  PACERONE Take 1 tablet (200 mg total) by mouth 2 (two) times daily.   apixaban 2.5 MG Tabs tablet Commonly known as:  ELIQUIS Take 1 tablet (2.5 mg total) by mouth 2 (two) times daily.   atorvastatin 20 MG tablet Commonly known as:  LIPITOR Take 20 mg by mouth at bedtime.   CALTRATE 600+D 600-400 MG-UNIT tablet Generic drug:  Calcium Carbonate-Vitamin D Take 1 tablet by mouth daily.   carvedilol 3.125 MG tablet Commonly known as:  COREG Take 3.125 mg by mouth 2 (two) times daily.   cetirizine 10 MG chewable tablet Commonly known as:  ZYRTEC Chew 10 mg by mouth daily.   cloNIDine 0.1 MG tablet Commonly known as:  CATAPRES Take 1 tablet (0.1 mg total) by mouth 2 (two) times daily. What changed:  medication strength  how much to take  when to take this   clopidogrel 75 MG tablet Commonly known as:  PLAVIX Take 1 tablet (75 mg total) by mouth daily.   collagenase ointment Commonly known as:  SANTYL Apply 1 application topically daily.   docusate sodium 100 MG capsule Commonly known as:  COLACE Take 1 capsule (100 mg total) by mouth daily as needed.   ferrous sulfate 325 (65 FE) MG tablet Take 325 mg by mouth daily with breakfast.   fluticasone 50 MCG/ACT nasal spray Commonly known as:  FLONASE Place 2 sprays into both nostrils at bedtime as needed for allergies or rhinitis.   furosemide 20 MG tablet Commonly known as:  LASIX Take 20 mg by mouth See admin instructions. Takes 1 tab on Monday, Wed, and Friday   hydrALAZINE 25 MG tablet Commonly known as:  APRESOLINE Take 3 tablets (75 mg total) by mouth every 8 (eight) hours. What changed:  how much to take   isosorbide mononitrate 30 MG 24 hr tablet Commonly known as:  IMDUR Take 1 tablet (30 mg total) by mouth daily.   multivitamin tablet Take 1 tablet by mouth daily.   pantoprazole 40 MG tablet Commonly known as:  PROTONIX Take 40 mg by mouth at  bedtime.   PHILLIPS COLON HEALTH PO Take 1 capsule by mouth daily.   predniSONE 10 MG tablet Commonly known as:  DELTASONE Take 1 tablet (10 mg total) by mouth daily.         Diet and Activity recommendation:  See Discharge Instructions above   Consults obtained -Cardiology, vascular, nephrology   Major procedures and Radiology Reports - PLEASE review detailed and final reports for all details, in brief -     Dg Chest 2 View  Result Date: 08/21/2016 CLINICAL DATA:  Mid chest pain with shortness of breath which is got worse over the past week. Nonsmoker, history of hypertension, aortic stenosis, primary pulmonary hypertension, history of mycobacterial lung infection. EXAM: CHEST  2 VIEW COMPARISON:  Chest x-ray of May 05, 2016 FINDINGS: The lungs are mildly hyperinflated with hemidiaphragm flattening. There is persistent increased density at the left lung base with obscuration of the hemidiaphragm. The lung markings are coarse in the right infrahilar region but also stable. The heart and pulmonary vascularity are normal. There is calcification in the wall of the thoracic aorta. The bony thorax exhibits no acute abnormality. IMPRESSION: Chronic bronchitic changes, stable. No CHF. Persistent bibasilar densities greatest on the left which may reflect scarring but superimposed pneumonia is not excluded. Followup PA and lateral chest X-ray is recommended in 3-4 weeks following trial of antibiotic therapy to ensure resolution and exclude underlying malignancy. Thoracic aortic atherosclerosis. Electronically Signed   By: David  Martinique M.D.   On: 08/21/2016 14:17   US Renal  Result Date: 08/29/2016 CLINICAL DATA:  Renal failure EXAM: RENAL / URINARY TRACT ULTRASOUND COMPLETE COMPARISON:  05/03/2016 FINDINGS: Right Kidney: Length: 8.2 cm. No hydronephrosis. Mildly increased echotexture. No mass. Left Kidney: Length: 7.5 cm. No hydronephrosis. Mildly increased echotexture. No mass. Bladder:  Appears normal for degree of bladder distention. IMPRESSION: Mildly atrophic kidneys bilaterally with increased echotexture compatible with chronic medical renal disease. No hydronephrosis. Electronically Signed   By: Rolm Baptise M.D.   On: 08/29/2016 09:22   Nm Pulmonary Perf And Vent  Result Date: 08/22/2016 CLINICAL DATA:  Pleuritic chest pain. EXAM: NUCLEAR MEDICINE VENTILATION - PERFUSION LUNG SCAN TECHNIQUE: Ventilation images were obtained in multiple projections using inhaled aerosol Tc-17m DTPA. Perfusion images were obtained in multiple projections after intravenous injection of Tc-72m MAA. RADIOPHARMACEUTICALS:  32.8 MCi Technetium-81m DTPA aerosol inhalation and 3.8 mCi Technetium-66m MAA IV COMPARISON:  08/21/2016. FINDINGS: Prominent patchy ventilation defects. Tiny perfusion defects addendum after smaller than ventilation defects. This scan is low probability for pulmonary embolus . IMPRESSION: Low probability for pulmonary embolus. Very poor ventilation suggesting COPD. Electronically Signed   By: Marcello Moores  Register   On: 08/22/2016 11:59   US Venous Img Lower Bilateral  Result Date: 08/21/2016 CLINICAL DATA:  83 year old female with leg swelling for years. Initial encounter. EXAM: BILATERAL LOWER EXTREMITY VENOUS DOPPLER ULTRASOUND TECHNIQUE: Gray-scale sonography with graded compression, as well as color Doppler and duplex ultrasound were performed to evaluate the lower extremity deep venous systems from the level of the common femoral vein and including the common femoral, femoral, profunda femoral, popliteal and calf veins including the posterior tibial, peroneal and gastrocnemius veins when visible. Spectral Doppler was utilized to evaluate flow at rest and with distal augmentation maneuvers in the common femoral, femoral and popliteal veins. COMPARISON:  None. FINDINGS: RIGHT LOWER EXTREMITY Common Femoral Vein: No evidence of thrombus. Normal compressibility, respiratory phasicity and  response to augmentation. Saphenofemoral Junction: No evidence of thrombus. Normal compressibility and flow on color Doppler imaging. Profunda Femoral Vein: No evidence of thrombus. Normal compressibility and flow on color Doppler imaging. Femoral Vein: No evidence of thrombus. Normal compressibility, respiratory phasicity and response to augmentation. Popliteal Vein: No evidence of thrombus. Normal compressibility, respiratory phasicity and response to  augmentation. Calf Veins: No evidence of thrombus. Normal compressibility and flow on color Doppler imaging. Other Findings:  None. LEFT LOWER EXTREMITY Common Femoral Vein: No evidence of thrombus. Normal compressibility, respiratory phasicity and response to augmentation. Saphenofemoral Junction: No evidence of thrombus. Normal compressibility and flow on color Doppler imaging. Profunda Femoral Vein: No evidence of thrombus. Normal compressibility and flow on color Doppler imaging. Femoral Vein: No evidence of thrombus. Normal compressibility, respiratory phasicity and response to augmentation. Popliteal Vein: No evidence of thrombus. Normal compressibility, respiratory phasicity and response to augmentation. Calf Veins: No evidence of thrombus. Normal compressibility and flow on color Doppler imaging. Other Findings:  None. IMPRESSION: No evidence of DVT within either lower extremity. Evaluation of left peroneal vein limited by edema and bandage. Electronically Signed   By: Genia Del M.D.   On: 08/21/2016 17:18   Dg Chest Port 1 View  Result Date: 08/23/2016 CLINICAL DATA:  New onset of atrial fibrillation, hypertension EXAM: PORTABLE CHEST 1 VIEW COMPARISON:  Chest x-ray of 08/21/2016 hand 05/05/2016 FINDINGS: There is persistent opacity at the left lung base with apparent left pleural effusion. In view of the persistent this opacity, CT of the chest is recommended to exclude underlying neoplasm. The right lung is clear. Mediastinal and hilar contours are  unremarkable. The heart is within upper limits of normal and stable. No bony abnormality is seen IMPRESSION: Persistent opacity at the left lung base consistent with effusion, atelectasis and possibly pneumonia. However in view of the persistence of this opacity, CT of the chest is recommended to exclude underlying neoplasm. Electronically Signed   By: Ivar Drape M.D.   On: 08/23/2016 10:39    Micro Results    No results found for this or any previous visit (from the past 240 hour(s)).     Today   Subjective:   Joanne Michael today No shortness of breath, no chest pain, stable for discharge.  Objective:   Blood pressure (!) 150/46, pulse (!) 57, temperature 98.1 F (36.7 C), temperature source Oral, resp. rate (!) 22, height 4\' 11"  (1.499 m), weight 55.9 kg (123 lb 4.8 oz), SpO2 96 %.  No intake or output data in the 24 hours ending 09/01/16 1156  Exam Awake Alert, Oriented x 3, No new F.N deficits, Normal affect [place].AT,PERRAL Supple Neck,No JVD, No cervical lymphadenopathy appriciated.  Symmetrical Chest wall movement, Good air movement bilaterally, CTAB RRR,No Gallops,Rubs or new Murmurs, No Parasternal Heave +ve B.Sounds, Abd Soft, Non tender, No organomegaly appriciated, No rebound -guarding or rigidity. No Cyanosis, Clubbing or edema, No new Rash or bruise  Data Review   CBC w Diff:  Lab Results  Component Value Date   WBC 12.6 (H) 08/30/2016   HGB 9.7 (L) 08/30/2016   HGB 11.5 (L) 11/19/2013   HCT 28.6 (L) 08/30/2016   HCT 36.3 11/19/2013   PLT 483 (H) 08/30/2016   PLT 290 11/19/2013   LYMPHOPCT 12 10/14/2015   LYMPHOPCT 20.6 11/19/2013   MONOPCT 6 10/14/2015   MONOPCT 6.6 11/19/2013   EOSPCT 2 10/14/2015   EOSPCT 2.4 11/19/2013   BASOPCT 0 10/14/2015   BASOPCT 0.4 11/19/2013    CMP:  Lab Results  Component Value Date   NA 129 (L) 08/30/2016   NA 139 09/26/2013   K 4.9 08/30/2016   K 4.3 09/26/2013   CL 96 (L) 08/30/2016   CL 105 09/26/2013   CO2  22 08/30/2016   CO2 28 09/26/2013   BUN 48 (H) 08/30/2016  BUN 19 (H) 09/26/2013   CREATININE 1.80 (H) 08/30/2016   CREATININE 1.31 (H) 09/26/2013   PROT 6.6 08/24/2016   PROT 6.1 (L) 03/07/2012   ALBUMIN 3.1 (L) 08/24/2016   ALBUMIN 2.6 (L) 03/07/2012   BILITOT 0.5 08/24/2016   BILITOT 0.6 03/07/2012   ALKPHOS 79 08/24/2016   ALKPHOS 99 03/07/2012   AST 24 08/24/2016   AST 20 03/07/2012   ALT 17 08/24/2016   ALT 14 03/07/2012  .   Total Time in preparing paper work, data evaluation and todays exam - 40 minutes  Henry Demeritt M.D on 08/30/2016 at 11:56 AM    Note: This dictation was prepared with Dragon dictation along with smaller phrase technology. Any transcriptional errors that result from this process are unintentional.

## 2016-09-03 ENCOUNTER — Encounter: Payer: Medicare Other | Attending: Physician Assistant | Admitting: Physician Assistant

## 2016-09-03 DIAGNOSIS — Z96659 Presence of unspecified artificial knee joint: Secondary | ICD-10-CM | POA: Insufficient documentation

## 2016-09-03 DIAGNOSIS — N182 Chronic kidney disease, stage 2 (mild): Secondary | ICD-10-CM | POA: Diagnosis not present

## 2016-09-03 DIAGNOSIS — I890 Lymphedema, not elsewhere classified: Secondary | ICD-10-CM | POA: Insufficient documentation

## 2016-09-03 DIAGNOSIS — M4800 Spinal stenosis, site unspecified: Secondary | ICD-10-CM | POA: Insufficient documentation

## 2016-09-03 DIAGNOSIS — Z90710 Acquired absence of both cervix and uterus: Secondary | ICD-10-CM | POA: Insufficient documentation

## 2016-09-03 DIAGNOSIS — M810 Age-related osteoporosis without current pathological fracture: Secondary | ICD-10-CM | POA: Diagnosis not present

## 2016-09-03 DIAGNOSIS — I129 Hypertensive chronic kidney disease with stage 1 through stage 4 chronic kidney disease, or unspecified chronic kidney disease: Secondary | ICD-10-CM | POA: Diagnosis not present

## 2016-09-03 DIAGNOSIS — L97222 Non-pressure chronic ulcer of left calf with fat layer exposed: Secondary | ICD-10-CM | POA: Insufficient documentation

## 2016-09-03 DIAGNOSIS — D649 Anemia, unspecified: Secondary | ICD-10-CM | POA: Diagnosis not present

## 2016-09-03 DIAGNOSIS — I4891 Unspecified atrial fibrillation: Secondary | ICD-10-CM | POA: Insufficient documentation

## 2016-09-04 NOTE — Progress Notes (Signed)
LAVENDER, STANKE ([id number]) Visit Report for 09/03/2016 Chief Complaint Document Details Patient Name: Joanne Michael, Joanne Michael. Date of Service: 09/03/2016 3:30 PM Medical Record Number: [id number] Patient Account Number: [id number] Date of Birth/Sex: 12/20/1924 (83 y.o. Female) Treating RN: Montey Hora Primary Care Provider: Dion Body Other Clinician: Referring Provider: Dion Body Treating Provider/Extender: Melburn Hake, Joanne Weeks in Treatment: 5 Information Obtained from: Patient Chief Complaint Patient presents to the wound care center due with non-wound condition(s) the left lower extremity Electronic Signature(s) Signed: 09/03/2016 4:54:08 PM By: Worthy Keeler PA-C Entered By: Worthy Keeler on 09/03/2016 16:10:47 Joanne Michael ([id number]) -------------------------------------------------------------------------------- Debridement Details Patient Name: Joanne Michael Date of Service: 09/03/2016 3:30 PM Medical Record Number: [id number] Patient Account Number: [id number] Date of Birth/Sex: 01-18-1925 (83 y.o. Female) Treating RN: Montey Hora Primary Care Provider: Dion Body Other Clinician: Referring Provider: Dion Body Treating Provider/Extender: Melburn Hake, Joanne Weeks in Treatment: 5 Debridement Performed for Wound #1 Left,Lateral Lower Leg Assessment: Performed By: Physician STONE III, Joanne E., PA-C Debridement: Debridement Pre-procedure Verification/Time Out Yes - 16:13 Taken: Start Time: 16:13 Pain Control: Lidocaine 4% Topical Solution Level: Skin/Subcutaneous Tissue Total Area Debrided (L x 1 (cm) x 1.2 (cm) = 1.2 (cm) W): Tissue and other Viable, Non-Viable, Fibrin/Slough, Subcutaneous material debrided: Instrument: Curette Bleeding: Minimum Hemostasis Achieved: Pressure End Time: 16:15 Procedural Pain: 0 Post Procedural Pain: 0 Response to Treatment: Procedure was tolerated well Post Debridement Measurements  of Total Wound Length: (cm) 1 Width: (cm) 1.2 Depth: (cm) 0.2 Volume: (cm) 0.188 Character of Wound/Ulcer Post Improved Debridement: Post Procedure Diagnosis Same as Pre-procedure Electronic Signature(s) Signed: 09/03/2016 4:54:08 PM By: Worthy Keeler PA-C Signed: 09/03/2016 5:19:36 PM By: Montey Hora Entered By: Montey Hora on 09/03/2016 16:14:39 Joanne Michael ([id number]) -------------------------------------------------------------------------------- HPI Details Patient Name: Joanne Michael Date of Service: 09/03/2016 3:30 PM Medical Record Number: [id number] Patient Account Number: [id number] Date of Birth/Sex: [birth year] (83 y.o. Female) Treating RN: Montey Hora Primary Care Provider: Dion Body Other Clinician: Referring Provider: Dion Body Treating Provider/Extender: Melburn Hake, Joanne Weeks in Treatment: 5 History of Present Illness Location: Patient presents with an ulcer on the left lower extremity in the anterior lateral calf area Quality: Patient reports experiencing a throbbing pain to affected area intermittently Severity: Patient states wound are getting better Duration: Patient has had the wound for > 2 months prior to seeking treatment at the wound center Timing: Pain in wound is Intermittent (comes and goes Context: The wound occurred when the patient had a blunt trauma to the legs and had a large swelling Modifying Factors: Other treatment(s) tried include:local care with antibiotic ointment Associated Signs and Symptoms: Patient reports having increase swelling. HPI Description: 83 year old patient referred to [place] by Ardyth Man, PA, for a ulcer on the left lower extremity, with cellulitis caused by trauma 2 months ago. past medical history of atrial fibrillation, chronic renal failure stage II, essential hypertension, and deficiency anemia, osteoporosis and spinal stenosis. She is status post appendectomy, gallbladder surgery,  hernia repair, hysterectomy, knee replacement. She was recently placed on doxycycline. 08/06/2016 -- she has not been using her lymphedema pumps and I have urged her to do this. She will be going off to the beach for a week and will see as the week after 08/20/16 on evaluation today patient's wound of the left lower extremity appears to be doing very well. She is been tolerating the dressing changes without complication and she is pleased with how this is progressing. There is  no evidence of infection. 09/03/16 on evaluation today patient appears to be doing well in regard to her wound. Unfortunately she missed her appointment last week because she was actually the hospital due to pneumonia. She has done well in recovery following that time and they took care for wounded very well for her during the time that she was in the hospital. She has no other worsening issues and no evidence of infection. Electronic Signature(s) Signed: 09/03/2016 4:54:08 PM By: Worthy Keeler PA-C Entered By: Worthy Keeler on 09/03/2016 16:19:13 Joanne Michael ([id number]) -------------------------------------------------------------------------------- Physical Exam Details Patient Name: Joanne Michael Date of Service: 09/03/2016 3:30 PM Medical Record Number: [id number] Patient Account Number: [id number] Date of Birth/Sex: [birth year] (83 y.o. Female) Treating RN: Montey Hora Primary Care Provider: Dion Body Other Clinician: Referring Provider: Dion Body Treating Provider/Extender: Melburn Hake, Joanne Weeks in Treatment: 5 Constitutional Well-nourished and well-hydrated in no acute distress. Respiratory normal breathing without difficulty. Psychiatric this patient is able to make decisions and demonstrates good insight into disease process. Alert and Oriented x 3. pleasant and cooperative. Notes Did have a slight amount of slough covering the wound surface along with biofilm that was  sharply depleted today with good result. Electronic Signature(s) Signed: 09/03/2016 4:54:08 PM By: Worthy Keeler PA-C Entered By: Worthy Keeler on 09/03/2016 16:19:48 Joanne Michael ([id number]) -------------------------------------------------------------------------------- Physician Orders Details Patient Name: Joanne Michael Date of Service: 09/03/2016 3:30 PM Medical Record Number: [id number] Patient Account Number: [id number] Date of Birth/Sex: [birth year] (83 y.o. Female) Treating RN: Montey Hora Primary Care Provider: Dion Body Other Clinician: Referring Provider: Dion Body Treating Provider/Extender: Melburn Hake, Joanne Weeks in Treatment: 5 Verbal / Phone Orders: No Diagnosis Coding ICD-10 Coding Code Description I89.0 Lymphedema, not elsewhere classified L97.222 Non-pressure chronic ulcer of left calf with fat layer exposed Wound Cleansing Wound #1 Left,Lateral Lower Leg o Clean wound with Normal Saline. o Cleanse wound with mild soap and water Anesthetic Wound #1 Left,Lateral Lower Leg o Topical Lidocaine 4% cream applied to wound bed prior to debridement Primary Wound Dressing Wound #1 Left,Lateral Lower Leg o Prisma Ag Secondary Dressing Wound #1 Left,Lateral Lower Leg o Gauze, ABD and Kerlix/Conform Dressing Change Frequency Wound #1 Left,Lateral Lower Leg o Change dressing every day. Follow-up Appointments Wound #1 Left,Lateral Lower Leg o Return Appointment in 1 week. - on Monday Edema Control Wound #1 Left,Lateral Lower Leg o Elevate legs to the level of the heart and pump ankles as often as possible o Compression Pump: Use compression pump on left lower extremity for 30 minutes, twice daily. TYANA, BUTZER ([id number]) o Compression Pump: Use compression pump on right lower extremity for 30 minutes, twice daily. Additional Orders / Instructions Wound #1 Left,Lateral Lower Leg o Increase protein  intake. o Other: - Please add vitamin A, vitamin C and zinc supplements to your diet Notes I'm going to switch her to a silver collagen dressing for the next week. We will see were things stand following. If anything worsens in the interim she will contact the office for additional recommendations. Electronic Signature(s) Signed: 09/03/2016 4:54:08 PM By: Worthy Keeler PA-C Entered By: Worthy Keeler on 09/03/2016 16:20:13 Joanne Michael ([id number]) -------------------------------------------------------------------------------- Problem List Details Patient Name: Joanne Michael Date of Service: 09/03/2016 3:30 PM Medical Record Number: [id number] Patient Account Number: [id number] Date of Birth/Sex: [birth year] (83 y.o. Female) Treating RN: Montey Hora Primary Care Provider: Dion Body Other Clinician: Referring Provider: Dion Body Treating Provider/Extender: Melburn Hake,  Joanne Weeks in Treatment: 5 Active Problems ICD-10 Encounter Code Description Active Date Diagnosis I89.0 Lymphedema, not elsewhere classified 07/26/2016 Yes L97.222 Non-pressure chronic ulcer of left calf with fat layer 07/26/2016 Yes exposed Inactive Problems Resolved Problems Electronic Signature(s) Signed: 09/03/2016 4:54:08 PM By: Worthy Keeler PA-C Entered By: Worthy Keeler on 09/03/2016 16:10:38 Joanne Michael ([id number]) -------------------------------------------------------------------------------- Progress Note Details Patient Name: Joanne Michael Date of Service: 09/03/2016 3:30 PM Medical Record Number: [id number] Patient Account Number: [id number] Date of Birth/Sex: [birth year] (83 y.o. Female) Treating RN: Montey Hora Primary Care Provider: Dion Body Other Clinician: Referring Provider: Dion Body Treating Provider/Extender: Melburn Hake, Joanne Weeks in Treatment: 5 Subjective Chief Complaint Information obtained from Patient Patient presents  to the wound care center due with non-wound condition(s) the left lower extremity History of Present Illness (HPI) The following HPI elements were documented for the patient's wound: Location: Patient presents with an ulcer on the left lower extremity in the anterior lateral calf area Quality: Patient reports experiencing a throbbing pain to affected area intermittently Severity: Patient states wound are getting better Duration: Patient has had the wound for > 2 months prior to seeking treatment at the wound center Timing: Pain in wound is Intermittent (comes and goes Context: The wound occurred when the patient had a blunt trauma to the legs and had a large swelling Modifying Factors: Other treatment(s) tried include:local care with antibiotic ointment Associated Signs and Symptoms: Patient reports having increase swelling. 83 year old patient referred to [place] by Ardyth Man, PA, for a ulcer on the left lower extremity, with cellulitis caused by trauma 2 months ago. past medical history of atrial fibrillation, chronic renal failure stage II, essential hypertension, and deficiency anemia, osteoporosis and spinal stenosis. She is status post appendectomy, gallbladder surgery, hernia repair, hysterectomy, knee replacement. She was recently placed on doxycycline. 08/06/2016 -- she has not been using her lymphedema pumps and I have urged her to do this. She will be going off to the beach for a week and will see as the week after 08/20/16 on evaluation today patient's wound of the left lower extremity appears to be doing very well. She is been tolerating the dressing changes without complication and she is pleased with how this is progressing. There is no evidence of infection. 09/03/16 on evaluation today patient appears to be doing well in regard to her wound. Unfortunately she missed her appointment last week because she was actually the hospital due to pneumonia. She has done well in recovery  following that time and they took care for wounded very well for her during the time that she was in the hospital. She has no other worsening issues and no evidence of infection. ZYONA, PETTAWAY ([id number]) Objective Constitutional Well-nourished and well-hydrated in no acute distress. Vitals Time Taken: 3:56 PM, Height: 60 in, Weight: 116 lbs, BMI: 22.7, Temperature: 98.3 F, Pulse: 67 bpm, Respiratory Rate: 18 breaths/min, Blood Pressure: 161/96 mmHg. Respiratory normal breathing without difficulty. Psychiatric this patient is able to make decisions and demonstrates good insight into disease process. Alert and Oriented x 3. pleasant and cooperative. General Notes: Did have a slight amount of slough covering the wound surface along with biofilm that was sharply depleted today with good result. Integumentary (Hair, Skin) Wound #1 status is Open. Original cause of wound was Trauma. The wound is located on the Left,Lateral Lower Leg. The wound measures 1cm length x 1.2cm width x 0.1cm depth; 0.942cm^2 area and 0.094cm^3 volume. There is no tunneling or  undermining noted. There is a large amount of serous drainage noted. The wound margin is flat and intact. There is large (67-100%) red granulation within the wound bed. There is a small (1-33%) amount of necrotic tissue within the wound bed including Eschar and Adherent Slough. The periwound skin appearance exhibited: Erythema. The periwound skin appearance did not exhibit: Callus, Crepitus, Excoriation, Induration, Rash, Scarring, Dry/Scaly, Maceration, Atrophie Blanche, Cyanosis, Ecchymosis, Hemosiderin Staining, Mottled, Pallor, Rubor. The surrounding wound skin color is noted with erythema which is circumferential. Periwound temperature was noted as No Abnormality. The periwound has tenderness on palpation. Assessment Active Problems ICD-10 I89.0 - Lymphedema, not elsewhere classified L97.222 - Non-pressure chronic ulcer of left  calf with fat layer exposed Loch, Kandas O. ([id number]) Procedures Wound #1 Pre-procedure diagnosis of Wound #1 is a Trauma, Other located on the Left,Lateral Lower Leg . There was a Skin/Subcutaneous Tissue Debridement ([id number]) debridement with total area of 1.2 sq cm performed by STONE III, Joanne E., PA-C. with the following instrument(s): Curette to remove Viable and Non-Viable tissue/material including Fibrin/Slough and Subcutaneous after achieving pain control using Lidocaine 4% Topical Solution. A time out was conducted at 16:13, prior to the start of the procedure. A Minimum amount of bleeding was controlled with Pressure. The procedure was tolerated well with a pain level of 0 throughout and a pain level of 0 following the procedure. Post Debridement Measurements: 1cm length x 1.2cm width x 0.2cm depth; 0.188cm^3 volume. Character of Wound/Ulcer Post Debridement is improved. Post procedure Diagnosis Wound #1: Same as Pre-Procedure Plan Wound Cleansing: Wound #1 Left,Lateral Lower Leg: Clean wound with Normal Saline. Cleanse wound with mild soap and water Anesthetic: Wound #1 Left,Lateral Lower Leg: Topical Lidocaine 4% cream applied to wound bed prior to debridement Primary Wound Dressing: Wound #1 Left,Lateral Lower Leg: Prisma Ag Secondary Dressing: Wound #1 Left,Lateral Lower Leg: Gauze, ABD and Kerlix/Conform Dressing Change Frequency: Wound #1 Left,Lateral Lower Leg: Change dressing every day. Follow-up Appointments: Wound #1 Left,Lateral Lower Leg: Return Appointment in 1 week. - on Monday Edema Control: Wound #1 Left,Lateral Lower Leg: Elevate legs to the level of the heart and pump ankles as often as possible Compression Pump: Use compression pump on left lower extremity for 30 minutes, twice daily. Compression Pump: Use compression pump on right lower extremity for 30 minutes, twice daily. Additional Orders / Instructions: Wound #1 Left,Lateral Lower  Leg: Increase protein intake. Other: - Please add vitamin A, vitamin C and zinc supplements to your diet KYM, SCANNELL ([id number]) General Notes: I'm going to switch her to a silver collagen dressing for the next week. We will see were things stand following. If anything worsens in the interim she will contact the office for additional recommendations. Electronic Signature(s) Signed: 09/03/2016 4:54:08 PM By: Worthy Keeler PA-C Entered By: Worthy Keeler on 09/03/2016 16:22:14 Joanne Michael ([id number]) -------------------------------------------------------------------------------- SuperBill Details Patient Name: Joanne Michael Date of Service: 09/03/2016 Medical Record Number: [id number] Patient Account Number: [id number] Date of Birth/Sex: June 15, 1924 (83 y.o. Female) Treating RN: Montey Hora Primary Care Provider: Dion Body Other Clinician: Referring Provider: Dion Body Treating Provider/Extender: Melburn Hake, Joanne Weeks in Treatment: 5 Diagnosis Coding ICD-10 Codes Code Description I89.0 Lymphedema, not elsewhere classified L97.222 Non-pressure chronic ulcer of left calf with fat layer exposed Facility Procedures CPT4 Code: 41660630 Description: 11042 - DEB SUBQ TISSUE 20 SQ CM/< ICD-10 Description Diagnosis L97.222 Non-pressure chronic ulcer of left calf with fat l Modifier: ayer exposed Quantity: 1 Physician Procedures CPT4 Code:  2984730 Description: 11042 - WC PHYS SUBQ TISS 20 SQ CM ICD-10 Description Diagnosis L97.222 Non-pressure chronic ulcer of left calf with fat l Modifier: ayer exposed Quantity: 1 Electronic Signature(s) Signed: 09/03/2016 4:54:08 PM By: Worthy Keeler PA-C Entered By: Worthy Keeler on 09/03/2016 85:69:43

## 2016-09-04 NOTE — Progress Notes (Signed)
Joanne Joanne, Joanne Joanne ([id number]) Visit Report for 09/03/2016 Arrival Information Details Patient Name: Joanne Joanne Date of Service: 09/03/2016 3:30 PM Medical Record Number: [id number] Patient Account Number: [id number] Date of Birth/Sex: 02/04/1925 (83 y.o. Female) Treating RN: Montey Hora Primary Care Joanne Joanne: Joanne Joanne Other Clinician: Referring Joanne Joanne: Joanne Joanne Treating Joanne Joanne/Extender: Joanne Joanne in Treatment: 5 Visit Information History Since Last Visit Added or deleted any medications: No Patient Arrived: Ambulatory Any new allergies or adverse reactions: No Arrival Time: 15:55 Had a fall or experienced change in No Accompanied By: self activities of daily living that may affect Transfer Assistance: None risk of falls: Patient Identification Verified: Yes Signs or symptoms of abuse/neglect since last No Secondary Verification Yes visito Process Completed: Hospitalized since last visit: No Patient Has Alerts: Yes Has Dressing in Place as Prescribed: Yes Patient Alerts: ABI McP[place]son BILATERAL Pain Present Now: No >220 Electronic Signature(s) Signed: 09/03/2016 5:19:36 PM By: Montey Hora Entered By: Montey Hora on 09/03/2016 15:55:18 Joanne Joanne ([id number]) -------------------------------------------------------------------------------- Encounter Discharge Information Details Patient Name: Joanne Joanne Date of Service: 09/03/2016 3:30 PM Medical Record Number: [id number] Patient Account Number: [id number] Date of Birth/Sex: Apr 15, 1924 (83 y.o. Female) Treating RN: Montey Hora Primary Care Joanne Joanne: Joanne Joanne Other Clinician: Referring Joanne Joanne: Joanne Joanne Treating Joanne Joanne/Extender: Joanne Joanne in Treatment: 5 Encounter Discharge Information Items Discharge Pain Level: 0 Discharge Condition: Stable Ambulatory Status: Ambulatory Discharge Destination: Home Transportation: Private  Auto Accompanied By: self Schedule Follow-up Appointment: Yes Medication Reconciliation completed and provided to Patient/Care No Dondra Rhett: Provided on Clinical Summary of Care: 09/03/2016 Form Type Recipient Paper Patient JM Electronic Signature(s) Signed: 09/03/2016 4:23:21 PM By: Ruthine Dose Entered By: Ruthine Dose on 09/03/2016 16:23:20 Joanne Joanne ([id number]) -------------------------------------------------------------------------------- Lower Extremity Assessment Details Patient Name: Joanne Joanne Date of Service: 09/03/2016 3:30 PM Medical Record Number: [id number] Patient Account Number: [id number] Date of Birth/Sex: 02-13-1925 (83 y.o. Female) Treating RN: Montey Hora Primary Care Kalya Troeger: Joanne Joanne Other Clinician: Referring Joanne Joanne: Joanne Joanne Treating Cleola Perryman/Extender: Joanne Joanne in Treatment: 5 Vascular Assessment Pulses: Dorsalis Pedis Palpable: [Left:Yes] Posterior Tibial Extremity colors, hair growth, and conditions: Extremity Color: [Left:Hyperpigmented] Hair Growth on Extremity: [Left:No] Temperature of Extremity: [Left:Warm] Capillary Refill: [Left:< 3 seconds] Toe Nail Assessment Left: Right: Thick: Yes Discolored: Yes Deformed: No Improper Length and Hygiene: No Electronic Signature(s) Signed: 09/03/2016 5:19:36 PM By: Montey Hora Entered By: Montey Hora on 09/03/2016 16:01:08 Joanne Joanne ([id number]) -------------------------------------------------------------------------------- Multi Wound Chart Details Patient Name: Joanne Joanne Date of Service: 09/03/2016 3:30 PM Medical Record Number: [id number] Patient Account Number: [id number] Date of Birth/Sex: Mar 04, 1924 (83 y.o. Female) Treating RN: Montey Hora Primary Care Raelea Gosse: Joanne Joanne Other Clinician: Referring Jorje Vanatta: Joanne Joanne Treating Nickolette Espinola/Extender: Joanne Joanne in Treatment: 5 Vital  Signs Height(in): 60 Pulse(bpm): 67 Weight(lbs): 116 Blood Pressure 161/96 (mmHg): Joanne Joanne(BMI): 23 Temperature(F): 98.3 Respiratory Rate 18 (breaths/min): Photos: [1:No Photos] [N/A:N/A] Wound Location: [1:Left Lower Leg - Lateral N/A] Wounding Event: [1:Trauma] [N/A:N/A] Primary Etiology: [1:Trauma, Other] [N/A:N/A] Comorbid History: [1:Arrhythmia, Hypertension, N/A Gout, Osteoarthritis] Date Acquired: [1:04/30/2016] [N/A:N/A] Michael of Treatment: [1:5] [N/A:N/A] Wound Status: [1:Open] [N/A:N/A] Measurements L x W x D 1x1.2x0.1 [N/A:N/A] (cm) Area (cm) : [1:0.942] [N/A:N/A] Volume (cm) : [1:0.094] [N/A:N/A] % Reduction in Area: [1:87.00%] [N/A:N/A] % Reduction in Volume: 87.10% [N/A:N/A] Classification: [1:Full Thickness Without Exposed Support Structures] [N/A:N/A] Exudate Amount: [1:Large] [N/A:N/A] Exudate Type: [1:Serous] [N/A:N/A] Exudate Color: [1:amber] [N/A:N/A] Wound Margin: [1:Flat and Intact] [N/A:N/A] Granulation Amount: [  1:Large (67-100%)] [N/A:N/A] Granulation Quality: [1:Red] [N/A:N/A] Necrotic Amount: [1:Small (1-33%)] [N/A:N/A] Necrotic Tissue: [1:Eschar, Adherent Slough N/A] Exposed Structures: [1:Fascia: No Fat Layer (Subcutaneous Tissue) Exposed: No Tendon: No] [N/A:N/A] Muscle: No Joint: No Bone: No Epithelialization: Medium (34-66%) N/A N/A Periwound Skin Texture: Excoriation: No N/A N/A Induration: No Callus: No Crepitus: No Rash: No Scarring: No Periwound Skin Maceration: No N/A N/A Moisture: Dry/Scaly: No Periwound Skin Color: Erythema: Yes N/A N/A Atrophie Blanche: No Cyanosis: No Ecchymosis: No Hemosiderin Staining: No Mottled: No Pallor: No Rubor: No Erythema Location: Circumferential N/A N/A Temperature: No Abnormality N/A N/A Tenderness on Yes N/A N/A Palpation: Wound Preparation: Ulcer Cleansing: N/A N/A Rinsed/Irrigated with Saline Topical Anesthetic Applied: Other: lidocaine 4% Treatment  Notes Electronic Signature(s) Signed: 09/03/2016 5:19:36 PM By: Montey Hora Entered By: Montey Hora on 09/03/2016 16:01:24 Joanne Joanne ([id number]) -------------------------------------------------------------------------------- [place] Details Patient Name: Joanne Joanne Date of Service: 09/03/2016 3:30 PM Medical Record Number: [id number] Patient Account Number: [id number] Date of Birth/Sex: 09-29-1924 (83 y.o. Female) Treating RN: Montey Hora Primary Care Raiden Yearwood: Joanne Joanne Other Clinician: Referring Emaline Karnes: Joanne Joanne Treating Shlomo Seres/Extender: Joanne Joanne in Treatment: 5 Active Inactive ` Abuse / Safety / Falls / Self Care Management Nursing Diagnoses: Potential for falls Goals: Patient will remain injury free related to falls Date Initiated: 07/26/2016 Target Resolution Date: 09/28/2016 Goal Status: Active Interventions: Assess fall risk on admission and as needed Notes: ` Orientation to the Wound Care Program Nursing Diagnoses: Knowledge deficit related to the wound healing center program Goals: Patient/caregiver will verbalize understanding of the [place] Program Date Initiated: 07/26/2016 Target Resolution Date: 09/28/2016 Goal Status: Active Interventions: Provide education on orientation to the wound center Notes: ` Wound/Skin Impairment Nursing Diagnoses: Impaired tissue integrity Joanne Joanne, Joanne Joanne ([id number]) Goals: Patient/caregiver will verbalize understanding of skin care regimen Date Initiated: 07/26/2016 Target Resolution Date: 09/28/2016 Goal Status: Active Ulcer/skin breakdown will have a volume reduction of 30% by week 4 Date Initiated: 07/26/2016 Target Resolution Date: 09/28/2016 Goal Status: Active Ulcer/skin breakdown will have a volume reduction of 50% by week 8 Date Initiated: 07/26/2016 Target Resolution Date: 09/28/2016 Goal Status: Active Ulcer/skin breakdown will  have a volume reduction of 80% by week 12 Date Initiated: 07/26/2016 Target Resolution Date: 09/28/2016 Goal Status: Active Ulcer/skin breakdown will heal within 14 Michael Date Initiated: 07/26/2016 Target Resolution Date: 09/28/2016 Goal Status: Active Interventions: Assess patient/caregiver ability to obtain necessary supplies Assess patient/caregiver ability to perform ulcer/skin care regimen upon admission and as needed Assess ulceration(s) every visit Notes: Electronic Signature(s) Signed: 09/03/2016 5:19:36 PM By: Montey Hora Entered By: Montey Hora on 09/03/2016 16:01:15 Joanne Joanne ([id number]) -------------------------------------------------------------------------------- Pain Assessment Details Patient Name: Joanne Joanne Date of Service: 09/03/2016 3:30 PM Medical Record Number: [id number] Patient Account Number: [id number] Date of Birth/Sex: 1924/10/19 (83 y.o. Female) Treating RN: Montey Hora Primary Care Hayden Mabin: Joanne Joanne Other Clinician: Referring Lamiyah Schlotter: Joanne Joanne Treating Braian Tijerina/Extender: Joanne Joanne in Treatment: 5 Active Problems Location of Pain Severity and Description of Pain Patient Has Paino No Site Locations Pain Management and Medication Current Pain Management: Notes Topical or injectable lidocaine is offered to patient for acute pain when surgical debridement is performed. If needed, Patient is instructed to use over the counter pain medication for the following 24-48 hours after debridement. Wound care MDs do not prescribed pain medications. Patient has chronic pain or uncontrolled pain. Patient has been instructed to make an appointment with their Primary Care Physician for pain management.  Electronic Signature(s) Signed: 09/03/2016 5:19:36 PM By: Montey Hora Entered By: Montey Hora on 09/03/2016 15:55:26 Joanne Joanne  ([id number]) -------------------------------------------------------------------------------- Patient/Caregiver Education Details Patient Name: Joanne Joanne Date of Service: 09/03/2016 3:30 PM Medical Record Number: [id number] Patient Account Number: [id number] Date of Birth/Gender: [birth year] (83 y.o. Female) Treating RN: Montey Hora Primary Care Physician: Joanne Joanne Other Clinician: Referring Physician: Dion Joanne Treating Physician/Extender: Sharalyn Ink in Treatment: 5 Education Assessment Education Provided To: Patient Education Topics Provided Wound/Skin Impairment: Handouts: Other: wound care as ordered Methods: Demonstration, Explain/Verbal Responses: State content correctly Electronic Signature(s) Signed: 09/03/2016 5:19:36 PM By: Montey Hora Entered By: Montey Hora on 09/03/2016 16:03:10 Joanne Joanne ([id number]) -------------------------------------------------------------------------------- Wound Assessment Details Patient Name: Joanne Joanne Date of Service: 09/03/2016 3:30 PM Medical Record Number: [id number] Patient Account Number: [id number] Date of Birth/Sex: [birth year] (83 y.o. Female) Treating RN: Montey Hora Primary Care Suki Crockett: Joanne Joanne Other Clinician: Referring Rudy Luhmann: Joanne Joanne Treating Tirsa Gail/Extender: Joanne Joanne in Treatment: 5 Wound Status Wound Number: 1 Primary Trauma, Other Etiology: Wound Location: Left Lower Leg - Lateral Wound Status: Open Wounding Event: Trauma Comorbid Arrhythmia, Hypertension, Gout, Date Acquired: 04/30/2016 History: Osteoarthritis Michael Of Treatment: 5 Clustered Wound: No Photos Photo Uploaded By: Montey Hora on 09/03/2016 17:06:11 Wound Measurements Length: (cm) 1 Width: (cm) 1.2 Depth: (cm) 0.1 Area: (cm) 0.942 Volume: (cm) 0.094 % Reduction in Area: 87% % Reduction in Volume: 87.1% Epithelialization: Medium  (34-66%) Tunneling: No Undermining: No Wound Description Full Thickness Without Exposed Classification: Support Structures Wound Margin: Flat and Intact Exudate Large Amount: Exudate Type: Serous Exudate Color: amber Foul Odor After Cleansing: No Slough/Fibrino Yes Wound Bed Granulation Amount: Large (67-100%) Exposed Structure Granulation Quality: Red Fascia Exposed: No Necrotic Amount: Small (1-33%) Fat Layer (Subcutaneous Tissue) Exposed: No Joanne Joanne, Joanne Joanne ([id number]) Necrotic Quality: Eschar, Adherent Slough Tendon Exposed: No Muscle Exposed: No Joint Exposed: No Bone Exposed: No Periwound Skin Texture Texture Color No Abnormalities Noted: No No Abnormalities Noted: No Callus: No Atrophie Blanche: No Crepitus: No Cyanosis: No Excoriation: No Ecchymosis: No Induration: No Erythema: Yes Rash: No Erythema Location: Circumferential Scarring: No Hemosiderin Staining: No Mottled: No Moisture Pallor: No No Abnormalities Noted: No Rubor: No Dry / Scaly: No Maceration: No Temperature / Pain Temperature: No Abnormality Tenderness on Palpation: Yes Wound Preparation Ulcer Cleansing: Rinsed/Irrigated with Saline Topical Anesthetic Applied: Other: lidocaine 4%, Treatment Notes Wound #1 (Left, Lateral Lower Leg) 1. Cleansed with: Clean wound with Normal Saline 2. Anesthetic Topical Lidocaine 4% cream to wound bed prior to debridement 4. Dressing Applied: Prisma Ag 5. Secondary Dressing Applied Guaze, ABD and kerlix/Conform 7. Secured with Recruitment consultant) Signed: 09/03/2016 5:19:36 PM By: Montey Hora Entered By: Montey Hora on 09/03/2016 16:00:52 Joanne Joanne ([id number]) -------------------------------------------------------------------------------- [place] Details Patient Name: Joanne Joanne Date of Service: 09/03/2016 3:30 PM Medical Record Number: [id number] Patient Account Number: [id number] Date of Birth/Sex:  1924/03/01 (83 y.o. Female) Treating RN: Montey Hora Primary Care Frieda Arnall: Joanne Joanne Other Clinician: Referring Leniya Breit: Joanne Joanne Treating Jaelah Hauth/Extender: Joanne Joanne in Treatment: 5 Vital Signs Time Taken: 15:56 Temperature (F): 98.3 Height (in): 60 Pulse (bpm): 67 Weight (lbs): 116 Respiratory Rate (breaths/min): 18 Joanne Joanne (BMI): 22.7 Blood Pressure (mmHg): 161/96 Reference Range: 80 - 120 mg / dl Electronic Signature(s) Signed: 09/03/2016 5:19:36 PM By: Montey Hora Entered By: Montey Hora on 09/03/2016 15:57:12

## 2016-09-10 ENCOUNTER — Encounter: Payer: Medicare Other | Admitting: Physician Assistant

## 2016-09-10 DIAGNOSIS — I890 Lymphedema, not elsewhere classified: Secondary | ICD-10-CM | POA: Diagnosis not present

## 2016-09-10 NOTE — Progress Notes (Signed)
CINA, KLUMPP ([id number]) Visit Report for 09/10/2016 Chief Complaint Document Details Patient Name: Joanne Michael, Joanne Michael Date of Service: 09/10/2016 11:15 AM Medical Record Number: [id number] Patient Account Number: [id number] Date of Birth/Sex: 12-08-1924 (83 y.o. Female) Treating RN: Cornell Barman Primary Care Provider: Dion Body Other Clinician: Referring Provider: Dion Body Treating Provider/Extender: Melburn Hake, Joanne Weeks in Treatment: 6 Information Obtained from: Patient Chief Complaint Patient presents to the wound care center due with non-wound condition(s) the left lower extremity Electronic Signature(s) Signed: 09/10/2016 3:36:45 PM By: Worthy Keeler PA-C Entered By: Worthy Keeler on 09/10/2016 11:19:58 Joanne Michael ([id number]) -------------------------------------------------------------------------------- HPI Details Patient Name: Joanne Michael Date of Service: 09/10/2016 11:15 AM Medical Record Number: [id number] Patient Account Number: [id number] Date of Birth/Sex: [birth year] (83 y.o. Female) Treating RN: Cornell Barman Primary Care Provider: Dion Body Other Clinician: Referring Provider: Dion Body Treating Provider/Extender: Melburn Hake, Joanne Weeks in Treatment: 6 History of Present Illness Location: Patient presents with an ulcer on the left lower extremity in the anterior lateral calf area Quality: Patient reports experiencing a throbbing pain to affected area intermittently Severity: Patient states wound are getting better Duration: Patient has had the wound for > 2 months prior to seeking treatment at the wound center Timing: Pain in wound is Intermittent (comes and goes Context: The wound occurred when the patient had a blunt trauma to the legs and had a large swelling Modifying Factors: Other treatment(s) tried include:local care with antibiotic ointment Associated Signs and Symptoms: Patient reports having increase  swelling. HPI Description: 83 year old patient referred to [place] by Ardyth Man, PA, for a ulcer on the left lower extremity, with cellulitis caused by trauma 2 months ago. past medical history of atrial fibrillation, chronic renal failure stage II, essential hypertension, and deficiency anemia, osteoporosis and spinal stenosis. She is status post appendectomy, gallbladder surgery, hernia repair, hysterectomy, knee replacement. She was recently placed on doxycycline. 08/06/2016 -- she has not been using her lymphedema pumps and I have urged her to do this. She will be going off to the beach for a week and will see as the week after 08/20/16 on evaluation today patient's wound of the left lower extremity appears to be doing very well. She is been tolerating the dressing changes without complication and she is pleased with how this is progressing. There is no evidence of infection. 09/03/16 on evaluation today patient appears to be doing well in regard to her wound. Unfortunately she missed her appointment last week because she was actually the hospital due to pneumonia. She has done well in recovery following that time and they took care for wounded very well for her during the time that she was in the hospital. She has no other worsening issues and no evidence of infection. 09/10/16 Patient's wound on the left lower extremity appears to be doing significantly better on evaluation today. There is some slight hyper granular tissue but overall this is showing good signs of improvement which is good news. She is pleased with how this is progressing. Electronic Signature(s) Signed: 09/10/2016 3:36:45 PM By: Worthy Keeler PA-C Entered By: Worthy Keeler on 09/10/2016 11:55:19 Joanne Michael ([id number]) -------------------------------------------------------------------------------- Physical Exam Details Patient Name: Joanne Michael Date of Service: 09/10/2016 11:15 AM Medical Record Number:  [id number] Patient Account Number: [id number] Date of Birth/Sex: August 13, 1924 (83 y.o. Female) Treating RN: Cornell Barman Primary Care Provider: Dion Body Other Clinician: Referring Provider: Dion Body Treating Provider/Extender: Melburn Hake, Joanne Weeks in Treatment: 6 Constitutional  Well-nourished and well-hydrated in no acute distress. Respiratory normal breathing without difficulty. clear to auscultation bilaterally. Cardiovascular regular rate and rhythm with normal S1, S2. Psychiatric this patient is able to make decisions and demonstrates good insight into disease process. Alert and Oriented x 3. pleasant and cooperative. Notes Patient's wound bed appears to be slightly hyper granular but otherwise appears to be doing very well. There is no evidence of infection. Electronic Signature(s) Signed: 09/10/2016 3:36:45 PM By: Worthy Keeler PA-C Entered By: Worthy Keeler on 09/10/2016 11:56:04 Joanne Michael ([id number]) -------------------------------------------------------------------------------- Physician Orders Details Patient Name: Joanne Michael Date of Service: 09/10/2016 11:15 AM Medical Record Number: [id number] Patient Account Number: [id number] Date of Birth/Sex: [birth year] (83 y.o. Female) Treating RN: Cornell Barman Primary Care Provider: Dion Body Other Clinician: Referring Provider: Dion Body Treating Provider/Extender: Melburn Hake, Joanne Weeks in Treatment: 6 Verbal / Phone Orders: No Diagnosis Coding ICD-10 Coding Code Description I89.0 Lymphedema, not elsewhere classified L97.222 Non-pressure chronic ulcer of left calf with fat layer exposed Wound Cleansing Wound #1 Left,Lateral Lower Leg o Clean wound with Normal Saline. o Cleanse wound with mild soap and water Anesthetic Wound #1 Left,Lateral Lower Leg o Topical Lidocaine 4% cream applied to wound bed prior to debridement Primary Wound Dressing Wound #1  Left,Lateral Lower Leg o Prisma Ag Secondary Dressing Wound #1 Left,Lateral Lower Leg o Gauze, ABD and Kerlix/Conform Dressing Change Frequency Wound #1 Left,Lateral Lower Leg o Change dressing every other day. Follow-up Appointments Wound #1 Left,Lateral Lower Leg o Return Appointment in 1 week. - on Monday Edema Control Wound #1 Left,Lateral Lower Leg o Elevate legs to the level of the heart and pump ankles as often as possible o Compression Pump: Use compression pump on left lower extremity for 30 minutes, twice daily. Joanne Michael, Joanne Michael ([id number]) o Compression Pump: Use compression pump on right lower extremity for 30 minutes, twice daily. Additional Orders / Instructions Wound #1 Left,Lateral Lower Leg o Increase protein intake. o Other: - Please add vitamin A, vitamin C and zinc supplements to your diet Notes I recommend that we continue with the current wound care measures for the next week. We will see her for reevaluation following to see were things stand and if anything worsens in the interim she will contact our office for additional recommendations. Electronic Signature(s) Signed: 09/10/2016 3:36:45 PM By: Worthy Keeler PA-C Entered By: Worthy Keeler on 09/10/2016 12:47:05 Joanne Michael ([id number]) -------------------------------------------------------------------------------- Problem List Details Patient Name: Joanne Michael Date of Service: 09/10/2016 11:15 AM Medical Record Number: [id number] Patient Account Number: [id number] Date of Birth/Sex: 02/06/1925 (83 y.o. Female) Treating RN: Cornell Barman Primary Care Provider: Dion Body Other Clinician: Referring Provider: Dion Body Treating Provider/Extender: Melburn Hake, Joanne Weeks in Treatment: 6 Active Problems ICD-10 Encounter Code Description Active Date Diagnosis I89.0 Lymphedema, not elsewhere classified 07/26/2016 Yes L97.222 Non-pressure chronic ulcer of  left calf with fat layer 07/26/2016 Yes exposed Inactive Problems Resolved Problems Electronic Signature(s) Signed: 09/10/2016 3:36:45 PM By: Worthy Keeler PA-C Entered By: Worthy Keeler on 09/10/2016 11:19:49 Joanne Michael ([id number]) -------------------------------------------------------------------------------- Progress Note Details Patient Name: Joanne Michael Date of Service: 09/10/2016 11:15 AM Medical Record Number: [id number] Patient Account Number: [id number] Date of Birth/Sex: [birth year] (83 y.o. Female) Treating RN: Cornell Barman Primary Care Provider: Dion Body Other Clinician: Referring Provider: Dion Body Treating Provider/Extender: Melburn Hake, Joanne Weeks in Treatment: 6 Subjective Chief Complaint Information obtained from Patient Patient presents to the wound care center due  with non-wound condition(s) the left lower extremity History of Present Illness (HPI) The following HPI elements were documented for the patient's wound: Location: Patient presents with an ulcer on the left lower extremity in the anterior lateral calf area Quality: Patient reports experiencing a throbbing pain to affected area intermittently Severity: Patient states wound are getting better Duration: Patient has had the wound for > 2 months prior to seeking treatment at the wound center Timing: Pain in wound is Intermittent (comes and goes Context: The wound occurred when the patient had a blunt trauma to the legs and had a large swelling Modifying Factors: Other treatment(s) tried include:local care with antibiotic ointment Associated Signs and Symptoms: Patient reports having increase swelling. 83 year old patient referred to [place] by Ardyth Man, PA, for a ulcer on the left lower extremity, with cellulitis caused by trauma 2 months ago. past medical history of atrial fibrillation, chronic renal failure stage II, essential hypertension, and deficiency anemia,  osteoporosis and spinal stenosis. She is status post appendectomy, gallbladder surgery, hernia repair, hysterectomy, knee replacement. She was recently placed on doxycycline. 08/06/2016 -- she has not been using her lymphedema pumps and I have urged her to do this. She will be going off to the beach for a week and will see as the week after 08/20/16 on evaluation today patient's wound of the left lower extremity appears to be doing very well. She is been tolerating the dressing changes without complication and she is pleased with how this is progressing. There is no evidence of infection. 09/03/16 on evaluation today patient appears to be doing well in regard to her wound. Unfortunately she missed her appointment last week because she was actually the hospital due to pneumonia. She has done well in recovery following that time and they took care for wounded very well for her during the time that she was in the hospital. She has no other worsening issues and no evidence of infection. 09/10/16 Patient's wound on the left lower extremity appears to be doing significantly better on evaluation today. There is some slight hyper granular tissue but overall this is showing good signs of improvement which is good news. She is pleased with how this is progressing. Joanne Michael, Joanne Michael ([id number]) Objective Constitutional Well-nourished and well-hydrated in no acute distress. Vitals Time Taken: 10:45 AM, Height: 60 in, Weight: 116 lbs, BMI: 22.7, Pulse: 50 bpm, Respiratory Rate: 16 breaths/min, Blood Pressure: 120/70 mmHg. Respiratory normal breathing without difficulty. clear to auscultation bilaterally. Cardiovascular regular rate and rhythm with normal S1, S2. Psychiatric this patient is able to make decisions and demonstrates good insight into disease process. Alert and Oriented x 3. pleasant and cooperative. General Notes: Patient's wound bed appears to be slightly hyper granular but otherwise appears  to be doing very well. There is no evidence of infection. Integumentary (Hair, Skin) Wound #1 status is Open. Original cause of wound was Trauma. The wound is located on the Left,Lateral Lower Leg. The wound measures 0.6cm length x 1cm width x 0.1cm depth; 0.471cm^2 area and 0.047cm^3 volume. There is Fat Layer (Subcutaneous Tissue) Exposed exposed. There is no tunneling or undermining noted. There is a medium amount of serous drainage noted. The wound margin is flat and intact. There is large (67-100%) red granulation within the wound bed. There is a small (1-33%) amount of necrotic tissue within the wound bed including Eschar and Adherent Slough. The periwound skin appearance exhibited: Scarring, Ecchymosis. The periwound skin appearance did not exhibit: Callus, Crepitus, Excoriation, Induration, Rash, Dry/Scaly,  Maceration, Atrophie Blanche, Cyanosis, Hemosiderin Staining, Mottled, Pallor, Rubor, Erythema. Periwound temperature was noted as No Abnormality. The periwound has tenderness on palpation. Assessment Active Problems ICD-10 I89.0 - Lymphedema, not elsewhere classified Joanne Michael, Joanne Michael ([id number]) [id number] - Non-pressure chronic ulcer of left calf with fat layer exposed Plan Wound Cleansing: Wound #1 Left,Lateral Lower Leg: Clean wound with Normal Saline. Cleanse wound with mild soap and water Anesthetic: Wound #1 Left,Lateral Lower Leg: Topical Lidocaine 4% cream applied to wound bed prior to debridement Primary Wound Dressing: Wound #1 Left,Lateral Lower Leg: Prisma Ag Secondary Dressing: Wound #1 Left,Lateral Lower Leg: Gauze, ABD and Kerlix/Conform Dressing Change Frequency: Wound #1 Left,Lateral Lower Leg: Change dressing every other day. Follow-up Appointments: Wound #1 Left,Lateral Lower Leg: Return Appointment in 1 week. - on Monday Edema Control: Wound #1 Left,Lateral Lower Leg: Elevate legs to the level of the heart and pump ankles as often as  possible Compression Pump: Use compression pump on left lower extremity for 30 minutes, twice daily. Compression Pump: Use compression pump on right lower extremity for 30 minutes, twice daily. Additional Orders / Instructions: Wound #1 Left,Lateral Lower Leg: Increase protein intake. Other: - Please add vitamin A, vitamin C and zinc supplements to your diet General Notes: I recommend that we continue with the current wound care measures for the next week. We will see her for reevaluation following to see were things stand and if anything worsens in the interim she will contact our office for additional recommendations. Electronic Signature(s) Signed: 09/10/2016 3:36:45 PM By: Doralee Albino, [place] ([id number]) Entered By: Worthy Keeler on 09/10/2016 12:47:35 Joanne Michael ([id number]) -------------------------------------------------------------------------------- SuperBill Details Patient Name: Joanne Michael Date of Service: 09/10/2016 Medical Record Number: [id number] Patient Account Number: [id number] Date of Birth/Sex: [birth year] (83 y.o. Female) Treating RN: Cornell Barman Primary Care Provider: Dion Body Other Clinician: Referring Provider: Dion Body Treating Provider/Extender: Melburn Hake, Joanne Weeks in Treatment: 6 Diagnosis Coding ICD-10 Codes Code Description I89.0 Lymphedema, not elsewhere classified L97.222 Non-pressure chronic ulcer of left calf with fat layer exposed Facility Procedures CPT4 Code: [id number] Description: [phone] - WOUND CARE VISIT-LEV 2 EST PT Modifier: Quantity: 1 Physician Procedures CPT4 Code: [id number] Description: 54982 - WC PHYS LEVEL 3 - EST PT ICD-10 Description Diagnosis I89.0 Lymphedema, not elsewhere classified L97.222 Non-pressure chronic ulcer of left calf with fat Modifier: layer exposed Quantity: 1 Electronic Signature(s) Signed: 09/10/2016 3:36:45 PM By: Worthy Keeler PA-C Entered By: Worthy Keeler on 09/10/2016 12:48:45

## 2016-09-11 NOTE — Progress Notes (Signed)
DARIYAH, GARDUNO ([id number]) Visit Report for 09/10/2016 Arrival Information Details Patient Name: Joanne Michael, Joanne Michael Date of Service: 09/10/2016 11:15 AM Medical Record Number: [id number] Patient Account Number: [id number] Date of Birth/Sex: [birth year] (83 y.o. Female) Treating RN: Cornell Barman Primary Care Fayrene Towner: Dion Body Other Clinician: Referring Riyana Biel: Dion Body Treating Tayten Bergdoll/Extender: Melburn Hake, Joanne Weeks in Treatment: 6 Visit Information History Since Last Visit Added or deleted any medications: No Patient Arrived: Ambulatory Any new allergies or adverse reactions: No Arrival Time: 10:40 Had a fall or experienced change in No Accompanied By: self activities of daily living that may affect Transfer Assistance: None risk of falls: Patient Identification Verified: Yes Signs or symptoms of abuse/neglect since last No Secondary Verification Yes visito Process Completed: Hospitalized since last visit: No Patient Has Alerts: Yes Has Dressing in Place as Prescribed: Yes Patient Alerts: ABI [place] BILATERAL Pain Present Now: No >220 Electronic Signature(s) Signed: 09/10/2016 4:48:49 PM By: Gretta Cool, BSN, RN, CWS, Kim RN, BSN Entered By: Gretta Cool, BSN, RN, CWS, Kim on 09/10/2016 10:45:42 Joanne Michael ([id number]) -------------------------------------------------------------------------------- Clinic Level of Care Assessment Details Patient Name: Joanne Michael Date of Service: 09/10/2016 11:15 AM Medical Record Number: [id number] Patient Account Number: [id number] Date of Birth/Sex: 1924-03-25 (83 y.o. Female) Treating RN: Cornell Barman Primary Care Avo Schlachter: Dion Body Other Clinician: Referring Anaily Ashbaugh: Dion Body Treating Beyonca Wisz/Extender: Melburn Hake, Joanne Weeks in Treatment: 6 Clinic Level of Care Assessment Items TOOL 4 Quantity Score []  - Use when only an EandM is performed on FOLLOW-UP visit 0 ASSESSMENTS - Nursing Assessment  / Reassessment []  - Reassessment of Co-morbidities (includes updates in patient status) 0 X - Reassessment of Adherence to Treatment Plan 1 5 ASSESSMENTS - Wound and Skin Assessment / Reassessment X - Simple Wound Assessment / Reassessment - one wound 1 5 []  - Complex Wound Assessment / Reassessment - multiple wounds 0 []  - Dermatologic / Skin Assessment (not related to wound area) 0 ASSESSMENTS - Focused Assessment []  - Circumferential Edema Measurements - multi extremities 0 []  - Nutritional Assessment / Counseling / Intervention 0 []  - Lower Extremity Assessment (monofilament, tuning fork, pulses) 0 []  - Peripheral Arterial Disease Assessment (using hand held doppler) 0 ASSESSMENTS - Ostomy and/or Continence Assessment and Care []  - Incontinence Assessment and Management 0 []  - Ostomy Care Assessment and Management (repouching, etc.) 0 PROCESS - Coordination of Care X - Simple Patient / Family Education for ongoing care 1 15 []  - Complex (extensive) Patient / Family Education for ongoing care 0 []  - Staff obtains Programmer, systems, Records, Test Results / Process Orders 0 []  - Staff telephones HHA, Nursing Homes / Clarify orders / etc 0 []  - Routine Transfer to another Facility (non-emergent condition) 0 Joanne Michael, Joanne Michael ([id number]) []  - Routine Hospital Admission (non-emergent condition) 0 []  - New Admissions / Biomedical engineer / Ordering NPWT, Apligraf, etc. 0 []  - Emergency Hospital Admission (emergent condition) 0 X - Simple Discharge Coordination 1 10 []  - Complex (extensive) Discharge Coordination 0 PROCESS - Special Needs []  - Pediatric / Minor Patient Management 0 []  - Isolation Patient Management 0 []  - Hearing / Language / Visual special needs 0 []  - Assessment of Community assistance (transportation, D/C planning, etc.) 0 []  - Additional assistance / Altered mentation 0 []  - Support Surface(s) Assessment (bed, cushion, seat, etc.) 0 INTERVENTIONS - Wound Cleansing /  Measurement X - Simple Wound Cleansing - one wound 1 5 []  - Complex Wound Cleansing - multiple wounds 0 X - Wound Imaging (photographs -  any number of wounds) 1 5 []  - Wound Tracing (instead of photographs) 0 X - Simple Wound Measurement - one wound 1 5 []  - Complex Wound Measurement - multiple wounds 0 INTERVENTIONS - Wound Dressings []  - Small Wound Dressing one or multiple wounds 0 X - Medium Wound Dressing one or multiple wounds 1 15 []  - Large Wound Dressing one or multiple wounds 0 []  - Application of Medications - topical 0 []  - Application of Medications - injection 0 INTERVENTIONS - Miscellaneous []  - External ear exam 0 Joanne Michael, SHEEK. ([id number]) []  - Specimen Collection (cultures, biopsies, blood, body fluids, etc.) 0 []  - Specimen(s) / Culture(s) sent or taken to Lab for analysis 0 []  - Patient Transfer (multiple staff / Harrel Lemon Lift / Similar devices) 0 []  - Simple Staple / Suture removal (25 or less) 0 []  - Complex Staple / Suture removal (26 or more) 0 []  - Hypo / Hyperglycemic Management (close monitor of Blood Glucose) 0 []  - Ankle / Brachial Index (ABI) - do not check if billed separately 0 X - Vital Signs 1 5 Has the patient been seen at the hospital within the last three years: Yes Total Score: 70 Level Of Care: New/Established - Level 2 Electronic Signature(s) Signed: 09/10/2016 4:48:49 PM By: Gretta Cool, BSN, RN, CWS, Kim RN, BSN Entered By: Gretta Cool, BSN, RN, CWS, Kim on 09/10/2016 11:26:32 Joanne Michael ([id number]) -------------------------------------------------------------------------------- Encounter Discharge Information Details Patient Name: Joanne Michael Date of Service: 09/10/2016 11:15 AM Medical Record Number: [id number] Patient Account Number: [id number] Date of Birth/Sex: 09/22/1924 (83 y.o. Female) Treating RN: Cornell Barman Primary Care Lanina Larranaga: Dion Body Other Clinician: Referring Emberlie Gotcher: Dion Body Treating  Betti Goodenow/Extender: Melburn Hake, Joanne Weeks in Treatment: 6 Encounter Discharge Information Items Discharge Pain Level: 0 Discharge Condition: Stable Ambulatory Status: Ambulatory Discharge Destination: Home Transportation: Private Auto son in law in [place] By: lobby Schedule Follow-up Appointment: Yes Medication Reconciliation completed and provided to Patient/Care Yes Gamal Todisco: Provided on Clinical Summary of Care: 09/10/2016 Form Type Recipient Paper Patient JM Electronic Signature(s) Signed: 09/10/2016 4:48:49 PM By: Gretta Cool, BSN, RN, CWS, Kim RN, BSN Previous Signature: 09/10/2016 11:27:06 AM Version By: Ruthine Dose Entered By: Gretta Cool BSN, RN, CWS, Kim on 09/10/2016 11:31:40 Joanne Michael ([id number]) -------------------------------------------------------------------------------- Lower Extremity Assessment Details Patient Name: Joanne Michael Date of Service: 09/10/2016 11:15 AM Medical Record Number: [id number] Patient Account Number: [id number] Date of Birth/Sex: Sep 15, 1924 (83 y.o. Female) Treating RN: Cornell Barman Primary Care Anitha Kreiser: Dion Body Other Clinician: Referring Per Beagley: Dion Body Treating Auryn Paige/Extender: Melburn Hake, Joanne Weeks in Treatment: 6 Vascular Assessment Claudication: Claudication Assessment [Left:None] Pulses: Dorsalis Pedis Palpable: [Left:Yes] Posterior Tibial Palpable: [Left:Yes] Extremity colors, hair growth, and conditions: Extremity Color: [Left:Hyperpigmented] Hair Growth on Extremity: [Left:Yes] Temperature of Extremity: [Left:Warm] Capillary Refill: [Left:< 3 seconds] Dependent Rubor: [Left:No] Blanched when Elevated: [Left:No] Lipodermatosclerosis: [Left:No] Toe Nail Assessment Left: Right: Thick: Yes Discolored: Yes Deformed: No Improper Length and Hygiene: No Electronic Signature(s) Signed: 09/10/2016 4:48:49 PM By: Gretta Cool, BSN, RN, CWS, Kim RN, BSN Entered By: Gretta Cool, BSN, RN, CWS, Kim on  09/10/2016 10:51:37 Joanne Michael ([id number]) -------------------------------------------------------------------------------- Multi Wound Chart Details Patient Name: Joanne Michael Date of Service: 09/10/2016 11:15 AM Medical Record Number: [id number] Patient Account Number: [id number] Date of Birth/Sex: 01-Jul-1924 (83 y.o. Female) Treating RN: Cornell Barman Primary Care Shelbee Apgar: Dion Body Other Clinician: Referring Lucius Wise: Dion Body Treating Shaquera Ansley/Extender: Melburn Hake, Joanne Weeks in Treatment: 6 Vital Signs Height(in): 60 Pulse(bpm): 50 Weight(lbs): 116 Blood Pressure 120/70 (  mmHg): Body Mass Index(BMI): 23 Temperature(F): Respiratory Rate 16 (breaths/min): Photos: [N/A:N/A] Wound Location: Left Lower Leg - Lateral N/A N/A Wounding Event: Trauma N/A N/A Primary Etiology: Trauma, Other N/A N/A Comorbid History: Arrhythmia, Hypertension, N/A N/A Gout, Osteoarthritis Date Acquired: 04/30/2016 N/A N/A Weeks of Treatment: 6 N/A N/A Wound Status: Open N/A N/A Measurements L x W x D 0.6x1x0.1 N/A N/A (cm) Area (cm) : 0.471 N/A N/A Volume (cm) : 0.047 N/A N/A % Reduction in Area: 93.50% N/A N/A % Reduction in Volume: 93.50% N/A N/A Classification: Full Thickness Without N/A N/A Exposed Support Structures Exudate Amount: Medium N/A N/A Exudate Type: Serous N/A N/A Exudate Color: amber N/A N/A Wound Margin: Flat and Intact N/A N/A Granulation Amount: Large (67-100%) N/A N/A Granulation Quality: Red N/A N/A Necrotic Amount: Small (1-33%) N/A N/A Joanne Michael, Joanne Michael ([id number]) Necrotic Tissue: Eschar, Adherent Slough N/A N/A Exposed Structures: Fat Layer (Subcutaneous N/A N/A Tissue) Exposed: Yes Fascia: No Tendon: No Muscle: No Joint: No Bone: No Epithelialization: Medium (34-66%) N/A N/A Periwound Skin Texture: Scarring: Yes N/A N/A Excoriation: No Induration: No Callus: No Crepitus: No Rash: No Periwound Skin Maceration: No N/A  N/A Moisture: Dry/Scaly: No Periwound Skin Color: Ecchymosis: Yes N/A N/A Atrophie Blanche: No Cyanosis: No Erythema: No Hemosiderin Staining: No Mottled: No Pallor: No Rubor: No Temperature: No Abnormality N/A N/A Tenderness on Yes N/A N/A Palpation: Wound Preparation: Ulcer Cleansing: N/A N/A Rinsed/Irrigated with Saline Topical Anesthetic Applied: Other: lidocaine 4% Treatment Notes Electronic Signature(s) Signed: 09/10/2016 4:48:49 PM By: Gretta Cool, BSN, RN, CWS, Kim RN, BSN Entered By: Gretta Cool, BSN, RN, CWS, Kim on 09/10/2016 10:51:53 Joanne Michael ([id number]) -------------------------------------------------------------------------------- [place] Details Patient Name: Joanne Michael, Joanne Michael Date of Service: 09/10/2016 11:15 AM Medical Record Number: [id number] Patient Account Number: [id number] Date of Birth/Sex: 08-Oct-1924 (83 y.o. Female) Treating RN: Cornell Barman Primary Care Atonya Templer: Dion Body Other Clinician: Referring Kennis Wissmann: Dion Body Treating Eddrick Dilone/Extender: Melburn Hake, Joanne Weeks in Treatment: 6 Active Inactive ` Abuse / Safety / Falls / Self Care Management Nursing Diagnoses: Potential for falls Goals: Patient will remain injury free related to falls Date Initiated: 07/26/2016 Target Resolution Date: 09/28/2016 Goal Status: Active Interventions: Assess fall risk on admission and as needed Notes: ` Orientation to the Wound Care Program Nursing Diagnoses: Knowledge deficit related to the wound healing center program Goals: Patient/caregiver will verbalize understanding of the [facility] Program Date Initiated: 07/26/2016 Target Resolution Date: 09/28/2016 Goal Status: Active Interventions: Provide education on orientation to the wound center Notes: ` Wound/Skin Impairment Nursing Diagnoses: Impaired tissue integrity Joanne Michael, Joanne Michael ([id number]) Goals: Patient/caregiver will verbalize  understanding of skin care regimen Date Initiated: 07/26/2016 Target Resolution Date: 09/28/2016 Goal Status: Active Ulcer/skin breakdown will have a volume reduction of 30% by week 4 Date Initiated: 07/26/2016 Target Resolution Date: 09/28/2016 Goal Status: Active Ulcer/skin breakdown will have a volume reduction of 50% by week 8 Date Initiated: 07/26/2016 Target Resolution Date: 09/28/2016 Goal Status: Active Ulcer/skin breakdown will have a volume reduction of 80% by week 12 Date Initiated: 07/26/2016 Target Resolution Date: 09/28/2016 Goal Status: Active Ulcer/skin breakdown will heal within 14 weeks Date Initiated: 07/26/2016 Target Resolution Date: 09/28/2016 Goal Status: Active Interventions: Assess patient/caregiver ability to obtain necessary supplies Assess patient/caregiver ability to perform ulcer/skin care regimen upon admission and as needed Assess ulceration(s) every visit Notes: Electronic Signature(s) Signed: 09/10/2016 4:48:49 PM By: Gretta Cool, BSN, RN, CWS, Kim RN, BSN Entered By: Gretta Cool, BSN, RN, CWS, Kim on 09/10/2016 10:51:45 Joanne Michael, Joanne Sis. (  [id number]) -------------------------------------------------------------------------------- Pain Assessment Details Patient Name: Joanne Michael, DULING. Date of Service: 09/10/2016 11:15 AM Medical Record Number: [id number] Patient Account Number: [id number] Date of Birth/Sex: 30-Dec-1924 (83 y.o. Female) Treating RN: Cornell Barman Primary Care Kayden Hutmacher: Dion Body Other Clinician: Referring Juelle Dickmann: Dion Body Treating Axtyn Woehler/Extender: Melburn Hake, Joanne Weeks in Treatment: 6 Active Problems Location of Pain Severity and Description of Pain Patient Has Paino No Site Locations With Dressing Change: No Pain Management and Medication Current Pain Management: Goals for Pain Management Topical or injectable lidocaine is offered to patient for acute pain when surgical debridement is performed. If needed, Patient is  instructed to use over the counter pain medication for the following 24-48 hours after debridement. Wound care MDs do not prescribed pain medications. Patient has chronic pain or uncontrolled pain. Patient has been instructed to make an appointment with their Primary Care Physician for pain management. Electronic Signature(s) Signed: 09/10/2016 4:48:49 PM By: Gretta Cool, BSN, RN, CWS, Kim RN, BSN Entered By: Gretta Cool, BSN, RN, CWS, Kim on 09/10/2016 10:45:56 Joanne Michael ([id number]) -------------------------------------------------------------------------------- Patient/Caregiver Education Details Patient Name: Joanne Michael Date of Service: 09/10/2016 11:15 AM Medical Record Number: [id number] Patient Account Number: [id number] Date of Birth/Gender: 1924-08-20 (83 y.o. Female) Treating RN: Cornell Barman Primary Care Physician: Dion Body Other Clinician: Referring Physician: Dion Body Treating Physician/Extender: Sharalyn Ink in Treatment: 6 Education Assessment Education Provided To: Patient Education Topics Provided Wound/Skin Impairment: Handouts: Caring for Your Ulcer Methods: Demonstration Responses: State content correctly Electronic Signature(s) Signed: 09/10/2016 4:48:49 PM By: Gretta Cool, BSN, RN, CWS, Kim RN, BSN Entered By: Gretta Cool, BSN, RN, CWS, Kim on 09/10/2016 11:31:58 Joanne Michael ([id number]) -------------------------------------------------------------------------------- Wound Assessment Details Patient Name: Joanne Michael Date of Service: 09/10/2016 11:15 AM Medical Record Number: [id number] Patient Account Number: [id number] Date of Birth/Sex: 04/25/1924 (83 y.o. Female) Treating RN: Cornell Barman Primary Care Odyssey Vasbinder: Dion Body Other Clinician: Referring Sharrod Achille: Dion Body Treating Marvell Tamer/Extender: Melburn Hake, Joanne Weeks in Treatment: 6 Wound Status Wound Number: 1 Primary Trauma, Other Etiology: Wound  Location: Left Lower Leg - Lateral Wound Status: Open Wounding Event: Trauma Comorbid Arrhythmia, Hypertension, Gout, Date Acquired: 04/30/2016 History: Osteoarthritis Weeks Of Treatment: 6 Clustered Wound: No Photos Wound Measurements Length: (cm) 0.6 Width: (cm) 1 Depth: (cm) 0.1 Area: (cm) 0.471 Volume: (cm) 0.047 % Reduction in Area: 93.5% % Reduction in Volume: 93.5% Epithelialization: Medium (34-66%) Tunneling: No Undermining: No Wound Description Full Thickness Without Exposed Classification: Support Structures Wound Margin: Flat and Intact Exudate Medium Amount: Exudate Type: Serous Exudate Color: amber Foul Odor After Cleansing: No Slough/Fibrino Yes Wound Bed Granulation Amount: Large (67-100%) Exposed Structure Granulation Quality: Red Fascia Exposed: No Necrotic Amount: Small (1-33%) Fat Layer (Subcutaneous Tissue) Exposed: Yes Necrotic Quality: Eschar, Adherent Slough Tendon Exposed: No Muscle Exposed: No Joint Exposed: No Joanne Michael, Joanne Michael ([id number]) Bone Exposed: No Periwound Skin Texture Texture Color No Abnormalities Noted: No No Abnormalities Noted: No Callus: No Atrophie Blanche: No Crepitus: No Cyanosis: No Excoriation: No Ecchymosis: Yes Induration: No Erythema: No Rash: No Hemosiderin Staining: No Scarring: Yes Mottled: No Pallor: No Moisture Rubor: No No Abnormalities Noted: No Dry / Scaly: No Temperature / Pain Maceration: No Temperature: No Abnormality Tenderness on Palpation: Yes Wound Preparation Ulcer Cleansing: Rinsed/Irrigated with Saline Topical Anesthetic Applied: Other: lidocaine 4%, Treatment Notes Wound #1 (Left, Lateral Lower Leg) 1. Cleansed with: Clean wound with Normal Saline 2. Anesthetic Topical Lidocaine 4% cream to wound bed prior to debridement 4. Dressing Applied: Prisma Ag 5. Secondary  Dressing Applied Dry Gauze Kerlix/Conform 7. Secured with Recruitment consultant) Signed:  09/10/2016 4:48:49 PM By: Gretta Cool, BSN, RN, CWS, Kim RN, BSN Entered By: Gretta Cool, BSN, RN, CWS, Kim on 09/10/2016 10:50:27 Joanne Michael ([id number]) -------------------------------------------------------------------------------- [place] Details Patient Name: Joanne Michael Date of Service: 09/10/2016 11:15 AM Medical Record Number: [id number] Patient Account Number: [id number] Date of Birth/Sex: Jun 30, 1924 (83 y.o. Female) Treating RN: Cornell Barman Primary Care Rosalina Dingwall: Dion Body Other Clinician: Referring Devarious Pavek: Dion Body Treating Chelesea Weiand/Extender: Melburn Hake, Joanne Weeks in Treatment: 6 Vital Signs Time Taken: 10:45 Pulse (bpm): 50 Height (in): 60 Respiratory Rate (breaths/min): 16 Weight (lbs): 116 Blood Pressure (mmHg): 120/70 Body Mass Index (BMI): 22.7 Reference Range: 80 - 120 mg / dl Electronic Signature(s) Signed: 09/10/2016 4:48:49 PM By: Gretta Cool, BSN, RN, CWS, Kim RN, BSN Entered By: Gretta Cool, BSN, RN, CWS, Kim on 09/10/2016 10:46:24

## 2016-09-12 ENCOUNTER — Encounter: Payer: Self-pay | Admitting: Family

## 2016-09-12 ENCOUNTER — Ambulatory Visit: Payer: Medicare Other | Attending: Family | Admitting: Family

## 2016-09-12 VITALS — BP 124/39 | HR 57 | Resp 18 | Ht 62.0 in | Wt 113.4 lb

## 2016-09-12 DIAGNOSIS — Z8042 Family history of malignant neoplasm of prostate: Secondary | ICD-10-CM | POA: Diagnosis not present

## 2016-09-12 DIAGNOSIS — Z79899 Other long term (current) drug therapy: Secondary | ICD-10-CM | POA: Insufficient documentation

## 2016-09-12 DIAGNOSIS — Z7901 Long term (current) use of anticoagulants: Secondary | ICD-10-CM | POA: Diagnosis not present

## 2016-09-12 DIAGNOSIS — M1990 Unspecified osteoarthritis, unspecified site: Secondary | ICD-10-CM | POA: Diagnosis not present

## 2016-09-12 DIAGNOSIS — M109 Gout, unspecified: Secondary | ICD-10-CM | POA: Insufficient documentation

## 2016-09-12 DIAGNOSIS — I10 Essential (primary) hypertension: Secondary | ICD-10-CM

## 2016-09-12 DIAGNOSIS — E785 Hyperlipidemia, unspecified: Secondary | ICD-10-CM | POA: Diagnosis not present

## 2016-09-12 DIAGNOSIS — N189 Chronic kidney disease, unspecified: Secondary | ICD-10-CM | POA: Insufficient documentation

## 2016-09-12 DIAGNOSIS — Z8249 Family history of ischemic heart disease and other diseases of the circulatory system: Secondary | ICD-10-CM | POA: Insufficient documentation

## 2016-09-12 DIAGNOSIS — I480 Paroxysmal atrial fibrillation: Secondary | ICD-10-CM

## 2016-09-12 DIAGNOSIS — Z8041 Family history of malignant neoplasm of ovary: Secondary | ICD-10-CM | POA: Insufficient documentation

## 2016-09-12 DIAGNOSIS — I5032 Chronic diastolic (congestive) heart failure: Secondary | ICD-10-CM | POA: Diagnosis not present

## 2016-09-12 DIAGNOSIS — I130 Hypertensive heart and chronic kidney disease with heart failure and stage 1 through stage 4 chronic kidney disease, or unspecified chronic kidney disease: Secondary | ICD-10-CM | POA: Insufficient documentation

## 2016-09-12 DIAGNOSIS — Z888 Allergy status to other drugs, medicaments and biological substances status: Secondary | ICD-10-CM | POA: Diagnosis not present

## 2016-09-12 DIAGNOSIS — M4800 Spinal stenosis, site unspecified: Secondary | ICD-10-CM | POA: Insufficient documentation

## 2016-09-12 DIAGNOSIS — Z885 Allergy status to narcotic agent status: Secondary | ICD-10-CM | POA: Insufficient documentation

## 2016-09-12 DIAGNOSIS — I509 Heart failure, unspecified: Secondary | ICD-10-CM | POA: Diagnosis present

## 2016-09-12 DIAGNOSIS — Z801 Family history of malignant neoplasm of trachea, bronchus and lung: Secondary | ICD-10-CM | POA: Diagnosis not present

## 2016-09-12 DIAGNOSIS — Z7951 Long term (current) use of inhaled steroids: Secondary | ICD-10-CM | POA: Insufficient documentation

## 2016-09-12 DIAGNOSIS — Z85820 Personal history of malignant melanoma of skin: Secondary | ICD-10-CM | POA: Insufficient documentation

## 2016-09-12 DIAGNOSIS — I4891 Unspecified atrial fibrillation: Secondary | ICD-10-CM | POA: Diagnosis not present

## 2016-09-12 DIAGNOSIS — Z800 Family history of malignant neoplasm of digestive organs: Secondary | ICD-10-CM | POA: Diagnosis not present

## 2016-09-12 DIAGNOSIS — D509 Iron deficiency anemia, unspecified: Secondary | ICD-10-CM | POA: Insufficient documentation

## 2016-09-12 DIAGNOSIS — Z91013 Allergy to seafood: Secondary | ICD-10-CM | POA: Diagnosis not present

## 2016-09-12 DIAGNOSIS — K219 Gastro-esophageal reflux disease without esophagitis: Secondary | ICD-10-CM | POA: Diagnosis not present

## 2016-09-12 NOTE — Progress Notes (Signed)
Patient ID: Joanne Michael, female    DOB: 1925-02-09, 83 y.o.   MRN: [id number]  HPI  Joanne Michael is a 83 y/o female with a history of spinal stenosis, anemia, HTN, hyperlipidemia, gout, GERD, chronic kidney disease, melanoma, arthritis, atrial fibrillation and chronic heart failure.   Reviewed last echo report from 07/31/16 which showed an EF of 65-70% along with mild/moderate AS/AR and mild MR.   Admitted 08/21/16 due to chest pain, chronic kidney disease and new onset atrial fibrillation. Cardiology consult obtained. Discharged home after 9 days. Admitted 05/05/16 due to pneumonia and hyponatremia. IV antibiotics initially given and then transitioned to oral antibiotics. Sodium level improved after hydration. Discharged home with home PT after 4 days. Was in the ED 05/03/16 due to dysuria. Treated and released. Was in the ED 04/16/16 due to a mechanical fall at home. Treated and released.   She presents today for her initial visit with a chief complaint of mild shortness of breath with moderate exertion. She says this has been present for a few months and is slowly improving. She has associated fatigue, cough, wheezing and light-headedness along with this.   Past Medical History:  Diagnosis Date  . Arrhythmia   . Arthritis   . Cancer ([place])    melanoma  . Cat allergies   . Chronic renal failure   . Fatigue   . GERD (gastroesophageal reflux disease)   . Gout   . H/O Clostridium difficile infection   . Hay fever    as child  . Hiatal hernia   . HLD (hyperlipidemia)   . HTN (hypertension)   . Iron deficiency anemia   . Spinal stenosis    Past Surgical History:  Procedure Laterality Date  . APPENDECTOMY    . BACK SURGERY    . CATARACT EXTRACTION    . CHOLECYSTECTOMY    . feet surgery    . HERNIA REPAIR    . knee replacement- both    . MELANOMA EXCISION    . NOSE SURGERY    . RENAL ANGIOGRAPHY N/A 05/07/2016   Procedure: Renal Angiography;  Surgeon: Algernon Huxley, MD;  Location:  [facility];  Service: Cardiovascular;  Laterality: N/A;  . ROTATOR CUFF REPAIR     left  . SHOULDER SURGERY    . TONSILLECTOMY    . TOTAL ABDOMINAL HYSTERECTOMY     Family History  Problem Relation Age of Onset  . Ovarian cancer Mother   . Liver cancer Father   . Heart failure Father   . Lung cancer Brother   . Prostate cancer Brother   . Lung cancer Sister    Social History  Substance Use Topics  . Smoking status: Never Smoker  . Smokeless tobacco: Never Used     Comment: Tobacco use- no   . Alcohol use 1.8 oz/week    3 Glasses of wine per week   Allergies  Allergen Reactions  . Oxycodone Itching  . Codeine Nausea And Vomiting and Itching  . Hydrocodone-Acetaminophen Other (See Comments)    Hallucinates  . Iodine Swelling, Other (See Comments), Itching and Nausea Only    Patient is allergic to seafood and this is the cause why she can't take medication.  . Meperidine Hcl     Unknown reactions.  . Other Swelling    Seafood- tongue swelling and discoloration Patient allergic to seafood, tongue swelling and discoloration.  . Propoxyphene N-Acetaminophen Other (See Comments)    Unknown reaction  Prior to Admission medications   Medication Sig Start Date End Date Taking? Authorizing Provider  allopurinol (ZYLOPRIM) 100 MG tablet Take 100 mg by mouth 2 (two) times daily.   Yes [provider]  amiodarone (PACERONE) 200 MG tablet Take 1 tablet (200 mg total) by mouth 2 (two) times daily. 08/30/16  Yes Epifanio Lesches, MD  apixaban (ELIQUIS) 2.5 MG TABS tablet Take 1 tablet (2.5 mg total) by mouth 2 (two) times daily. 08/30/16  Yes Epifanio Lesches, MD  Ascorbic Acid (VITAMIN C) 1000 MG tablet Take 1,000 mg by mouth daily.   Yes [provider]  atorvastatin (LIPITOR) 20 MG tablet Take 20 mg by mouth at bedtime.    Yes [provider]  Calcium Carbonate-Vitamin D (CALTRATE 600+D) 600-400 MG-UNIT per tablet Take 1 tablet by mouth  daily.     Yes [provider]  carvedilol (COREG) 3.125 MG tablet Take 3.125 mg by mouth 2 (two) times daily. 04/06/16 04/06/17 Yes [provider]  cetirizine (ZYRTEC) 10 MG chewable tablet Chew 10 mg by mouth daily.     Yes [provider]  cloNIDine (CATAPRES) 0.1 MG tablet Take 1 tablet (0.1 mg total) by mouth 2 (two) times daily. 08/30/16  Yes Epifanio Lesches, MD  collagenase (SANTYL) ointment Apply 1 application topically daily.   Yes [provider]  ferrous sulfate 325 (65 FE) MG tablet Take 325 mg by mouth daily with breakfast.   Yes [provider]  fluticasone (FLONASE) 50 MCG/ACT nasal spray Place 2 sprays into both nostrils at bedtime as needed for allergies or rhinitis. 05/25/15  Yes Sudini, Alveta Heimlich, MD  furosemide (LASIX) 20 MG tablet Take 20 mg by mouth See admin instructions. Takes 1 tab on Monday, Wed, and Friday    Yes [provider]  hydrALAZINE (APRESOLINE) 25 MG tablet Take 3 tablets (75 mg total) by mouth every 8 (eight) hours. 08/30/16  Yes Epifanio Lesches, MD  isosorbide mononitrate (IMDUR) 30 MG 24 hr tablet Take 1 tablet (30 mg total) by mouth daily. 08/31/16  Yes Epifanio Lesches, MD  Multiple Vitamin (MULTIVITAMIN) tablet Take 1 tablet by mouth daily.   Yes [provider]  Multiple Vitamins-Minerals (PRESERVISION AREDS 2 PO) Take 1 capsule by mouth daily.   Yes [provider]  pantoprazole (PROTONIX) 40 MG tablet Take 40 mg by mouth at bedtime.  02/14/15  Yes Minna Merritts, MD  Probiotic Product ([place]) Take 1 capsule by mouth daily.   Yes [provider]  albuterol (PROVENTIL HFA;VENTOLIN HFA) 108 (90 BASE) MCG/ACT inhaler Inhale 1-2 puffs into the lungs every 6 (six) hours as needed for wheezing or shortness of breath. Patient not taking: Reported on 09/12/2016 02/14/15   Minna Merritts, MD   Review of Systems  Constitutional: Positive for appetite change  (decreased) and fatigue.  HENT: Positive for rhinorrhea. Negative for congestion and sore throat.   Eyes: Negative.   Respiratory: Positive for cough (dry cough), chest tightness, shortness of breath and wheezing.   Cardiovascular: Negative for chest pain, palpitations and leg swelling.  Gastrointestinal: Positive for abdominal pain (right groin pain) and nausea (after eating). Negative for abdominal distention.  Endocrine: Negative.   Genitourinary: Negative.   Musculoskeletal: Negative for back pain and neck pain.  Skin: Positive for wound (left lower leg).  Allergic/Immunologic: Negative.   Neurological: Positive for light-headedness. Negative for dizziness.  Hematological: Negative for adenopathy. Bruises/bleeds easily.  Psychiatric/Behavioral: Positive for sleep disturbance (restless sleep; sleeping  on 1 pillow). Negative for dysphoric mood. The patient is not nervous/anxious.    Vitals:   09/12/16 0915  BP: (!) 124/39  Pulse: (!) 57  Resp: 18  SpO2: 99%  Weight: 113 lb 6 oz (51.4 kg)  Height: 5\' 2"  (1.575 m)   Wt Readings from Last 3 Encounters:  09/12/16 113 lb 6 oz (51.4 kg)  08/21/16 123 lb 4.8 oz (55.9 kg)  05/25/16 115 lb 8 oz (52.4 kg)   Lab Results  Component Value Date   CREATININE 1.80 (H) 08/30/2016   CREATININE 2.28 (H) 08/28/2016   CREATININE 2.22 (H) 08/27/2016    Physical Exam  Constitutional: She is oriented to person, place, and time. She appears well-developed and well-nourished.  HENT:  Head: Normocephalic and atraumatic.  Neck: Normal range of motion. Neck supple. No JVD present.  Cardiovascular: Regular rhythm.  Bradycardia present.   Pulmonary/Chest: Effort normal. She has wheezes (expiratory) in the right lower field.  Abdominal: Soft. She exhibits no distension. There is no tenderness.  Musculoskeletal: She exhibits no edema or tenderness.  Neurological: She is alert and oriented to person, place, and time.  Skin: Skin is warm and dry.   Psychiatric: She has a normal mood and affect. Her behavior is normal. Thought content normal.  Nursing note and vitals reviewed.  Assessment & Plan:  1: Chronic heart failure with preserved ejection fraction- - NYHA class II - euvolemic today - isn't weighing daily as she doesn't have scales. Set of scales given to her and she was instructed to weigh every morning, write the weight down and call for an overnight weight gain of >2 pounds or a weekly weight gain of >5 pounds - not adding salt and is reading food labels. Instructed to closely follow a 2000mg  sodium diet and written information was given to her about this - sees her cardiologist Rockey Situ) next week - vest reading 26% today  2: HTN- - BP looks good today - saw PCP ([place]) 09/10/16  3: Atrial fibrillation- - currently rate controlled at this time - on amiodarone and carvedilol  Patient did not bring her medications nor a list. Each medication was verbally reviewed with the patient and she was encouraged to bring the bottles to every visit to confirm accuracy of list.  Return in 1 month or sooner for any questions/problems before then.

## 2016-09-12 NOTE — Patient Instructions (Signed)
Begin weighing daily and call for an overnight weight gain of > 2 pounds or a weekly weight gain of >5 pounds. 

## 2016-09-15 NOTE — Progress Notes (Signed)
Cardiology Office Note  Date:  09/17/2016   ID:  Joanne Michael, DOB [birth year], MRN [id number]  PCP:  Dion Body, MD   Chief Complaint  Patient presents with  . other    F/u hospital afib. Meds reviewed verbally with pt.    HPI:  Joanne Michael is a pleasant 83 yo woman with  mild aortic valve stenosis in 2011,  GERD,  hyperlipidemia,  hypertension  Previous episode  of near syncope Mild carotid disease Less than 39% b/l disease 11/2015 who presented for lower extremity edema that started in December 2014,   improved symptoms after amlodipine was held She presents today for follow-up of her blood pressure,Leg bruising  Recent hospital admission 6/26 to 08/30/2016  for chest pain elevated troponin chronic kidney disease new onset atrial fibrillation,  Hospital records reviewed with the patient in detail Felt to have noncardiac chest pain VQ scan low probability PE Possible left lower lobe pneumonia treated with antibiotics Creatinine 2.28 at peak Plavix discontinued by Dr. Lucky Cowboy On anticoagulation for atrial fibrillation. She converted to normal sinus and the hospital    echocardiogram with normal ejection fraction Mild to moderate aortic valve stenosis   Takes miralex, prunes For constipation Continues to have coughing, relying on her albuterol inhaler Previously seen by pulmonary 2 years ago, has not had follow-up Still feels congested. Prior history of pneumonias March 2018  Previously seen in the ER seen in the emergency room 05/03/2016 for dysuria, fecal impaction Started on Miramax Seen in the emergency room 04/16/2016 contusions to the face Seen in the emergency room 01/17/2016 urinary retention fecal impaction  EKG on today's visit shows normal sinus rhythm with rate 50 bpm no significant ST or T-wave changes  Other past medical history reviewed Previous Large hematoma,Left lower extremity  Previous hospital for Hypertensive urgency due to severe Renal  artery stenosis right  Renal angioplasty, 3 weeks ago, Dr. dew On asa and plavix   hospital for PNA ,  05/09/2016 RequiredABX,  Low sodium On arrival,Received normal saline, sodium 124 up to 137 Potassium 3.1 Had diarrhea when she got home  Previous leg edema resolved by holding amlodipine Denies having any further episodes of near syncope or syncope  08/08/2014 she was at church when she developed syncope/near syncope. She was in a sitting position. Reports indicate low blood pressure. She was taken to the emergency room, lab work showed elevated BUN and creatinine consistent with dehydration. She was given fluids with improvement of her symptoms. She denied any recent illnesses. Several days prior she had radiofrequency ablation of her back.   Lab work from primary care shows creatinine 2.0, GFR in the 20s she was previously on lisinopril and HCTZ, pill. This was held and she was changed to amlodipine Laboratory from 07/08/2011 shows creatinine 2.1, GFR 22, BUN 21, potassium 3.3  Prior echocardiogram in 2011 showing normal LV systolic function, diastolic relaxation abnormality, mild aortic valve stenosis with no significant gradient, mild MR, high normal right ventricular systolic pressures  PMH:   has a past medical history of Arrhythmia; Arthritis; Cancer ([place]); Cat allergies; Chronic renal failure; Fatigue; GERD (gastroesophageal reflux disease); Gout; H/O Clostridium difficile infection; Hay fever; Hiatal hernia; HLD (hyperlipidemia); HTN (hypertension); Iron deficiency anemia; and Spinal stenosis.  PSH:    Past Surgical History:  Procedure Laterality Date  . APPENDECTOMY    . BACK SURGERY    . CATARACT EXTRACTION    . CHOLECYSTECTOMY    . feet surgery    .  HERNIA REPAIR    . knee replacement- both    . MELANOMA EXCISION    . NOSE SURGERY    . RENAL ANGIOGRAPHY N/A 05/07/2016   Procedure: Renal Angiography;  Surgeon: Algernon Huxley, MD;  Location: [facility];   Service: Cardiovascular;  Laterality: N/A;  . ROTATOR CUFF REPAIR     left  . SHOULDER SURGERY    . TONSILLECTOMY    . TOTAL ABDOMINAL HYSTERECTOMY      Current Outpatient Prescriptions  Medication Sig Dispense Refill  . albuterol (PROVENTIL HFA;VENTOLIN HFA) 108 (90 BASE) MCG/ACT inhaler Inhale 1-2 puffs into the lungs every 6 (six) hours as needed for wheezing or shortness of breath. 1 Inhaler 6  . allopurinol (ZYLOPRIM) 100 MG tablet Take 100 mg by mouth 2 (two) times daily.    Marland Kitchen amiodarone (PACERONE) 200 MG tablet Take 1 tablet (200 mg total) by mouth 2 (two) times daily. 60 tablet 0  . apixaban (ELIQUIS) 2.5 MG TABS tablet Take 1 tablet (2.5 mg total) by mouth 2 (two) times daily. 60 tablet 0  . Ascorbic Acid (VITAMIN C) 1000 MG tablet Take 1,000 mg by mouth daily.    Marland Kitchen atorvastatin (LIPITOR) 20 MG tablet Take 20 mg by mouth at bedtime.     . Calcium Carbonate-Vitamin D (CALTRATE 600+D) 600-400 MG-UNIT per tablet Take 1 tablet by mouth daily.      . carvedilol (COREG) 3.125 MG tablet Take 3.125 mg by mouth 2 (two) times daily.    . cetirizine (ZYRTEC) 10 MG chewable tablet Chew 10 mg by mouth daily.      . cloNIDine (CATAPRES) 0.1 MG tablet Take 1 tablet (0.1 mg total) by mouth 2 (two) times daily. 60 tablet 11  . collagenase (SANTYL) ointment Apply 1 application topically daily.    . ferrous sulfate 325 (65 FE) MG tablet Take 325 mg by mouth daily with breakfast.    . fluticasone (FLONASE) 50 MCG/ACT nasal spray Place 2 sprays into both nostrils at bedtime as needed for allergies or rhinitis. 16 g 0  . furosemide (LASIX) 20 MG tablet Take 20 mg by mouth See admin instructions. Takes 1 tab on Monday, Wed, and Friday     . hydrALAZINE (APRESOLINE) 25 MG tablet Take 3 tablets (75 mg total) by mouth every 8 (eight) hours. 60 tablet 0  . isosorbide mononitrate (IMDUR) 30 MG 24 hr tablet Take 1 tablet (30 mg total) by mouth daily. 30 tablet 0  . Multiple Vitamin (MULTIVITAMIN) tablet Take 1  tablet by mouth daily.    . Multiple Vitamins-Minerals (PRESERVISION AREDS 2 PO) Take 1 capsule by mouth daily.    . pantoprazole (PROTONIX) 40 MG tablet Take 40 mg by mouth at bedtime.  30 tablet 11  . Probiotic Product ([place] [employer] PO) Take 1 capsule by mouth daily.     No current facility-administered medications for this visit.      Allergies:   Oxycodone; Codeine; Hydrocodone-acetaminophen; Iodine; Meperidine hcl; Other; and Propoxyphene n-acetaminophen   Social History:  The patient  reports that she has never smoked. She has never used smokeless tobacco. She reports that she drinks about 1.8 oz of alcohol per week . She reports that she does not use drugs.   Family History:   family history includes Heart failure in her father; Liver cancer in her father; Lung cancer in her brother and sister; Ovarian cancer in her mother; Prostate cancer in her brother.    Review of  Systems: Review of Systems  Constitutional: Negative.   Respiratory: Positive for shortness of breath.   Cardiovascular: Negative.   Gastrointestinal: Negative.   Musculoskeletal: Negative.   Skin:       Leg bruising  Neurological: Negative.   Psychiatric/Behavioral: Negative.   All other systems reviewed and are negative.    PHYSICAL EXAM: VS:  BP (!) 130/44 (BP Location: Left Arm, Patient Position: Sitting, Cuff Size: Normal)   Pulse (!) 50   Ht 5' (1.524 m)   Wt 116 lb 8 oz (52.8 kg)   BMI 22.75 kg/m  , BMI Body mass index is 22.75 kg/m. GEN: Well nourished, well developed, in no acute distress  HEENT: normal  Neck: no JVD, carotid bruits, or masses Cardiac:   regular rhythm, bradycardic, 2/6 SEM RSB, no rubs, or gallops,Trace nonpitting edema  Respiratory:  clear to auscultation bilaterally, normal work of breathing GI: soft, nontender, nondistended, + BS Joanne: no deformity or atrophy  Skin: Large region of ecchymosis and blistering left lower extremity Neuro:  Strength and sensation are  intact Psych: euthymic mood, full affect    Recent Labs: 08/21/2016: B Natriuretic Peptide 1,207.0 08/23/2016: Magnesium 1.9; TSH 6.375 08/24/2016: ALT 17 08/30/2016: BUN 48; Creatinine, Ser 1.80; Hemoglobin 9.7; Platelets 483; Potassium 4.9; Sodium 129    Lipid Panel Lab Results  Component Value Date   CHOL 118 05/06/2016   HDL 46 05/06/2016   LDLCALC 54 05/06/2016   TRIG 89 05/06/2016      Wt Readings from Last 3 Encounters:  09/17/16 116 lb 8 oz (52.8 kg)  09/12/16 113 lb 6 oz (51.4 kg)  08/21/16 123 lb 4.8 oz (55.9 kg)       ASSESSMENT AND PLAN:   Paroxysmal atrial fibrillation Maintaining normal sinus rhythm on today's visit, heart rate is low likely causing some fatigue. She does not have her medication lists but we have encouraged her to decrease amiodarone down to 1 a day. Also decrease carvedilol down to 1 a day If she continues to have bradycardia we could hold carvedilol  Mixed hyperlipidemia - Cholesterol is at goal on the current lipid regimen. No changes to the medications were made.  Essential hypertension, benign - Plan: EKG 12-Lead, ECHOCARDIOGRAM COMPLETE Heart rate low we will decrease Coreg down to 3.125 mg daily   Aortic valve stenosis, etiology of cardiac valve disease unspecified -  Mild to moderate aortic valve stenosis, will monitor with periodic echocardiogram   Chronic renal impairment, stage 2 (mild) -  Followed by nephrology  Renal artery stenosis ([place]) stent placement by Dr. dew  , on eliquis  Fatigue   likely still recovering from recent hospitalization also with bradycardia Medication changes as above   Disposition:   F/U  6 months   Total encounter time more than 25 minutes  Greater than 50% was spent in counseling and coordination of care with the patient    Orders Placed This Encounter  Procedures  . EKG 12-Lead     Signed, Esmond Plants, M.D., Ph.D. 09/17/2016  [place],  [place]

## 2016-09-17 ENCOUNTER — Encounter: Payer: Self-pay | Admitting: Cardiovascular Disease

## 2016-09-17 ENCOUNTER — Ambulatory Visit (INDEPENDENT_AMBULATORY_CARE_PROVIDER_SITE_OTHER): Payer: Medicare Other | Admitting: Cardiovascular Disease

## 2016-09-17 ENCOUNTER — Encounter: Payer: Medicare Other | Admitting: Surgery

## 2016-09-17 VITALS — BP 130/44 | HR 50 | Ht 60.0 in | Wt 116.5 lb

## 2016-09-17 DIAGNOSIS — E782 Mixed hyperlipidemia: Secondary | ICD-10-CM

## 2016-09-17 DIAGNOSIS — I5032 Chronic diastolic (congestive) heart failure: Secondary | ICD-10-CM | POA: Diagnosis not present

## 2016-09-17 DIAGNOSIS — I480 Paroxysmal atrial fibrillation: Secondary | ICD-10-CM | POA: Diagnosis not present

## 2016-09-17 DIAGNOSIS — I701 Atherosclerosis of renal artery: Secondary | ICD-10-CM

## 2016-09-17 DIAGNOSIS — I350 Nonrheumatic aortic (valve) stenosis: Secondary | ICD-10-CM

## 2016-09-17 DIAGNOSIS — N183 Chronic kidney disease, stage 3 unspecified: Secondary | ICD-10-CM

## 2016-09-17 DIAGNOSIS — I890 Lymphedema, not elsewhere classified: Secondary | ICD-10-CM | POA: Diagnosis not present

## 2016-09-17 DIAGNOSIS — I10 Essential (primary) hypertension: Secondary | ICD-10-CM

## 2016-09-17 MED ORDER — CARVEDILOL 3.125 MG PO TABS: 3.1250 mg | ORAL_TABLET | Freq: Every day | ORAL | 6 refills | Status: DC

## 2016-09-17 MED ORDER — AMIODARONE HCL 200 MG PO TABS: 200.0000 mg | ORAL_TABLET | Freq: Every day | ORAL | 6 refills | Status: DC

## 2016-09-17 NOTE — Patient Instructions (Addendum)
Medication Instructions:   Please decrease the amiodarone down to one a day Please take coreg once a day in the AM  Monitor your heart rate and call our office  Labwork:  No new labs needed  Testing/Procedures:  No further testing at this time   Follow-Up: It was a pleasure seeing you in the office today. Please call us if you have new issues that need to be addressed before your next appt.  [phone]  Your physician wants you to follow-up in: 6 months.  You will receive a reminder letter in the mail two months in advance. If you don't receive a letter, please call our office to schedule the follow-up appointment.  If you need a refill on your cardiac medications before your next appointment, please call your pharmacy.

## 2016-09-18 NOTE — Progress Notes (Signed)
LILLIEN, PETRONIO ([id number]) Visit Report for 09/17/2016 Arrival Information Details Patient Name: SHANDI, GODFREY Date of Service: 09/17/2016 1:30 PM Medical Record Number: [id number] Patient Account Number: [id number] Date of Birth/Sex: 1924/09/24 (83 y.o. Female) Treating RN: Ahmed Prima Primary Care Eutimio Gharibian: Dion Body Other Clinician: Referring Veona Bittman: Dion Body Treating Noreta Kue/Extender: Frann Rider in Treatment: 7 Visit Information History Since Last Visit All ordered tests and consults were completed: No Patient Arrived: Ambulatory Added or deleted any medications: No Arrival Time: 13:22 Any new allergies or adverse reactions: No Accompanied By: self Had a fall or experienced change in No Transfer Assistance: None activities of daily living that may affect Patient Identification Verified: Yes risk of falls: Secondary Verification Process Yes Signs or symptoms of abuse/neglect since last No Completed: visito Patient Requires Transmission- No Hospitalized since last visit: No Based Precautions: Has Dressing in Place as Prescribed: Yes Patient Has Alerts: Yes Pain Present Now: No Patient Alerts: ABI [place] BILATERAL >220 Electronic Signature(s) Signed: 09/17/2016 4:27:45 PM By: Alric Quan Entered By: Alric Quan on 09/17/2016 13:25:40 [name] ([id number]) -------------------------------------------------------------------------------- Clinic Level of Care Assessment Details Patient Name: [name] Date of Service: 09/17/2016 1:30 PM Medical Record Number: [id number] Patient Account Number: [id number] Date of Birth/Sex: [birth year] (83 y.o. Female) Treating RN: Carolyne Fiscal, Debi Primary Care Ketura Sirek: Dion Body Other Clinician: Referring Alaycia Eardley: Dion Body Treating Daiel Strohecker/Extender: Frann Rider in Treatment: 7 Clinic Level of Care Assessment Items TOOL 4 Quantity Score X - Use  when only an EandM is performed on FOLLOW-UP visit 1 0 ASSESSMENTS - Nursing Assessment / Reassessment X - Reassessment of Co-morbidities (includes updates in patient status) 1 10 X - Reassessment of Adherence to Treatment Plan 1 5 ASSESSMENTS - Wound and Skin Assessment / Reassessment X - Simple Wound Assessment / Reassessment - one wound 1 5 []  - Complex Wound Assessment / Reassessment - multiple wounds 0 []  - Dermatologic / Skin Assessment (not related to wound area) 0 ASSESSMENTS - Focused Assessment []  - Circumferential Edema Measurements - multi extremities 0 []  - Nutritional Assessment / Counseling / Intervention 0 []  - Lower Extremity Assessment (monofilament, tuning fork, pulses) 0 []  - Peripheral Arterial Disease Assessment (using hand held doppler) 0 ASSESSMENTS - Ostomy and/or Continence Assessment and Care []  - Incontinence Assessment and Management 0 []  - Ostomy Care Assessment and Management (repouching, etc.) 0 PROCESS - Coordination of Care X - Simple Patient / Family Education for ongoing care 1 15 []  - Complex (extensive) Patient / Family Education for ongoing care 0 []  - Staff obtains Programmer, systems, Records, Test Results / Process Orders 0 []  - Staff telephones HHA, Nursing Homes / Clarify orders / etc 0 []  - Routine Transfer to another Facility (non-emergent condition) 0 CRYSTA, GULICK ([id number]) []  - Routine Hospital Admission (non-emergent condition) 0 []  - New Admissions / Biomedical engineer / Ordering NPWT, Apligraf, etc. 0 []  - Emergency Hospital Admission (emergent condition) 0 X - Simple Discharge Coordination 1 10 []  - Complex (extensive) Discharge Coordination 0 PROCESS - Special Needs []  - Pediatric / Minor Patient Management 0 []  - Isolation Patient Management 0 []  - Hearing / Language / Visual special needs 0 []  - Assessment of Community assistance (transportation, D/C planning, etc.) 0 []  - Additional assistance / Altered mentation 0 []  -  Support Surface(s) Assessment (bed, cushion, seat, etc.) 0 INTERVENTIONS - Wound Cleansing / Measurement X - Simple Wound Cleansing - one wound 1 5 []  - Complex Wound Cleansing - multiple wounds  0 X - Wound Imaging (photographs - any number of wounds) 1 5 []  - Wound Tracing (instead of photographs) 0 []  - Simple Wound Measurement - one wound 0 []  - Complex Wound Measurement - multiple wounds 0 INTERVENTIONS - Wound Dressings []  - Small Wound Dressing one or multiple wounds 0 []  - Medium Wound Dressing one or multiple wounds 0 []  - Large Wound Dressing one or multiple wounds 0 []  - Application of Medications - topical 0 []  - Application of Medications - injection 0 INTERVENTIONS - Miscellaneous []  - External ear exam 0 LYNDSI, ALTIC. ([id number]) []  - Specimen Collection (cultures, biopsies, blood, body fluids, etc.) 0 []  - Specimen(s) / Culture(s) sent or taken to Lab for analysis 0 []  - Patient Transfer (multiple staff / Harrel Lemon Lift / Similar devices) 0 []  - Simple Staple / Suture removal (25 or less) 0 []  - Complex Staple / Suture removal (26 or more) 0 []  - Hypo / Hyperglycemic Management (close monitor of Blood Glucose) 0 []  - Ankle / Brachial Index (ABI) - do not check if billed separately 0 X - Vital Signs 1 5 Has the patient been seen at the hospital within the last three years: Yes Total Score: 60 Level Of Care: New/Established - Level 2 Electronic Signature(s) Signed: 09/17/2016 4:27:45 PM By: Alric Quan Entered By: Alric Quan on 09/17/2016 14:13:30 [name] ([id number]) -------------------------------------------------------------------------------- Encounter Discharge Information Details Patient Name: [name] Date of Service: 09/17/2016 1:30 PM Medical Record Number: [id number] Patient Account Number: [id number] Date of Birth/Sex: 24-Apr-1924 (83 y.o. Female) Treating RN: Carolyne Fiscal, Debi Primary Care Kejon Feild: Dion Body Other  Clinician: Referring Quanisha Drewry: Dion Body Treating Teauna Dubach/Extender: Frann Rider in Treatment: 7 Encounter Discharge Information Items Discharge Pain Level: 0 Discharge Condition: Stable Ambulatory Status: Ambulatory Discharge Destination: Home Transportation: Private Auto Accompanied By: son in law Schedule Follow-up Appointment: No Medication Reconciliation completed and provided to Patient/Care No Bijan Ridgley: Provided on Clinical Summary of Care: 09/17/2016 Form Type Recipient Paper Patient JM Electronic Signature(s) Signed: 09/17/2016 1:39:56 PM By: Ruthine Dose Entered By: Ruthine Dose on 09/17/2016 13:39:56 [name] ([id number]) -------------------------------------------------------------------------------- Lower Extremity Assessment Details Patient Name: [name] Date of Service: 09/17/2016 1:30 PM Medical Record Number: [id number] Patient Account Number: [id number] Date of Birth/Sex: November 02, 1924 (83 y.o. Female) Treating RN: Carolyne Fiscal, Debi Primary Care Kerrick Miler: Dion Body Other Clinician: Referring Deanndra Kirley: Dion Body Treating Elizandro Laura/Extender: Frann Rider in Treatment: 7 Vascular Assessment Pulses: Dorsalis Pedis Palpable: [Left:Yes] Posterior Tibial Extremity colors, hair growth, and conditions: Extremity Color: [Left:Hyperpigmented] Temperature of Extremity: [Left:Warm] Capillary Refill: [Left:< 3 seconds] Toe Nail Assessment Left: Right: Thick: Yes Discolored: Yes Deformed: No Improper Length and Hygiene: No Electronic Signature(s) Signed: 09/17/2016 4:27:45 PM By: Alric Quan Entered By: Alric Quan on 09/17/2016 13:30:22 [name] ([id number]) -------------------------------------------------------------------------------- Multi Wound Chart Details Patient Name: [name] Date of Service: 09/17/2016 1:30 PM Medical Record Number: [id number] Patient Account  Number: [id number] Date of Birth/Sex: 08-03-1924 (83 y.o. Female) Treating RN: Ahmed Prima Primary Care Kolston Lacount: Dion Body Other Clinician: Referring Emme Rosenau: Dion Body Treating Danai Gotto/Extender: Frann Rider in Treatment: 7 Vital Signs Height(in): 60 Pulse(bpm): 50 Weight(lbs): 116 Blood Pressure 129/55 (mmHg): Body Mass Index(BMI): 23 Temperature(F): Respiratory Rate 18 (breaths/min): Photos: [1:No Photos] [N/A:N/A] Wound Location: [1:Left Lower Leg - Lateral N/A] Wounding Event: [1:Trauma] [N/A:N/A] Primary Etiology: [1:Trauma, Other] [N/A:N/A] Comorbid History: [1:Arrhythmia, Hypertension, N/A Gout, Osteoarthritis] Date Acquired: [1:04/30/2016] [N/A:N/A] Weeks of Treatment: [1:7] [N/A:N/A] Wound Status: [1:Open] [N/A:N/A] Measurements L  x W x D 0x0x0 [N/A:N/A] (cm) Area (cm) : [1:0] [N/A:N/A] Volume (cm) : [1:0] [N/A:N/A] % Reduction in Area: [1:100.00%] [N/A:N/A] % Reduction in Volume: 100.00% [N/A:N/A] Classification: [1:Full Thickness Without Exposed Support Structures] [N/A:N/A] Exudate Amount: [1:None Present] [N/A:N/A] Wound Margin: [1:Flat and Intact] [N/A:N/A] Granulation Amount: [1:None Present (0%)] [N/A:N/A] Necrotic Amount: [1:None Present (0%)] [N/A:N/A] Exposed Structures: [1:Fat Layer (Subcutaneous N/A Tissue) Exposed: Yes Fascia: No Tendon: No Muscle: No Joint: No Bone: No] Epithelialization: [1:Large (67-100%)] [N/A:N/A] Periwound Skin Texture: Scarring: Yes N/A N/A Excoriation: No Induration: No Callus: No Crepitus: No Rash: No Periwound Skin Maceration: No N/A N/A Moisture: Dry/Scaly: No Periwound Skin Color: Ecchymosis: Yes N/A N/A Atrophie Blanche: No Cyanosis: No Erythema: No Hemosiderin Staining: No Mottled: No Pallor: No Rubor: No Temperature: No Abnormality N/A N/A Tenderness on Yes N/A N/A Palpation: Wound Preparation: Ulcer Cleansing: N/A N/A Rinsed/Irrigated with Saline Topical  Anesthetic Applied: None Treatment Notes Electronic Signature(s) Signed: 09/17/2016 1:36:03 PM By: Christin Fudge MD, FACS Entered By: Christin Fudge on 09/17/2016 13:36:02 [name] ([id number]) -------------------------------------------------------------------------------- Rudolph Details Patient Name: [name] Date of Service: 09/17/2016 1:30 PM Medical Record Number: [id number] Patient Account Number: [id number] Date of Birth/Sex: [birth year] (83 y.o. Female) Treating RN: Carolyne Fiscal, Debi Primary Care Keilyn Haggard: Dion Body Other Clinician: Referring Seferina Brokaw: Dion Body Treating Avana Kreiser/Extender: Frann Rider in Treatment: 7 Active Inactive Electronic Signature(s) Signed: 09/17/2016 4:27:45 PM By: Alric Quan Entered By: Alric Quan on 09/17/2016 13:35:00 [name] ([id number]) -------------------------------------------------------------------------------- Pain Assessment Details Patient Name: [name] Date of Service: 09/17/2016 1:30 PM Medical Record Number: [id number] Patient Account Number: [id number] Date of Birth/Sex: [birth year] (83 y.o. Female) Treating RN: Carolyne Fiscal, Debi Primary Care Harbert Fitterer: Dion Body Other Clinician: Referring Jynesis Nakamura: Dion Body Treating Sie Formisano/Extender: Frann Rider in Treatment: 7 Active Problems Location of Pain Severity and Description of Pain Patient Has Paino No Site Locations With Dressing Change: No Pain Management and Medication Current Pain Management: Electronic Signature(s) Signed: 09/17/2016 4:27:45 PM By: Alric Quan Entered By: Alric Quan on 09/17/2016 13:25:48 [name] ([id number]) -------------------------------------------------------------------------------- Patient/Caregiver Education Details Patient Name: [name] Date of Service: 09/17/2016 1:30 PM Medical Record Number:  [id number] Patient Account Number: [id number] Date of Birth/Gender: 1924-10-15 (83 y.o. Female) Treating RN: Ahmed Prima Primary Care Physician: Dion Body Other Clinician: Referring Physician: Dion Body Treating Physician/Extender: Frann Rider in Treatment: 7 Education Assessment Education Provided To: Patient Education Topics Provided Wound/Skin Impairment: Handouts: Other: Please call our office if you have any questions or concerns. Methods: Explain/Verbal Responses: State content correctly Electronic Signature(s) Signed: 09/17/2016 4:27:45 PM By: Alric Quan Entered By: Alric Quan on 09/17/2016 13:37:19 [name] ([id number]) -------------------------------------------------------------------------------- Wound Assessment Details Patient Name: [name] Date of Service: 09/17/2016 1:30 PM Medical Record Number: [id number] Patient Account Number: [id number] Date of Birth/Sex: 17-Jun-1924 (83 y.o. Female) Treating RN: Carolyne Fiscal, Debi Primary Care Corynn Solberg: Dion Body Other Clinician: Referring Jariah Tarkowski: Dion Body Treating Hoke Baer/Extender: Frann Rider in Treatment: 7 Wound Status Wound Number: 1 Primary Trauma, Other Etiology: Wound Location: Left Lower Leg - Lateral Wound Status: Open Wounding Event: Trauma Comorbid Arrhythmia, Hypertension, Gout, Date Acquired: 04/30/2016 History: Osteoarthritis Weeks Of Treatment: 7 Clustered Wound: No Photos Photo Uploaded By: Alric Quan on 09/17/2016 16:16:59 Wound Measurements Length: (cm) 0 % Reduction i Width: (cm) 0 % Reduction i Depth: (cm) 0 Epithelializa Area: (cm) 0 Tunneling: Volume: (cm) 0 Undermining: n Area: 100% n Volume: 100% tion: Large (67-100%) No No Wound Description  Full Thickness Without Exposed Classification: Support Structures Wound Margin: Flat and Intact Exudate None Present Amount: Foul Odor After  Cleansing: No Slough/Fibrino No Wound Bed Granulation Amount: None Present (0%) Exposed Structure Necrotic Amount: None Present (0%) Fascia Exposed: No Fat Layer (Subcutaneous Tissue) Exposed: Yes Tendon Exposed: No Muscle Exposed: No LITTLE, BASHORE ([id number]) Joint Exposed: No Bone Exposed: No Periwound Skin Texture Texture Color No Abnormalities Noted: No No Abnormalities Noted: No Callus: No Atrophie Blanche: No Crepitus: No Cyanosis: No Excoriation: No Ecchymosis: Yes Induration: No Erythema: No Rash: No Hemosiderin Staining: No Scarring: Yes Mottled: No Pallor: No Moisture Rubor: No No Abnormalities Noted: No Dry / Scaly: No Temperature / Pain Maceration: No Temperature: No Abnormality Tenderness on Palpation: Yes Wound Preparation Ulcer Cleansing: Rinsed/Irrigated with Saline Topical Anesthetic Applied: None Electronic Signature(s) Signed: 09/17/2016 4:27:45 PM By: Alric Quan Entered By: Alric Quan on 09/17/2016 13:34:32 [name] ([id number]) -------------------------------------------------------------------------------- Vitals Details Patient Name: [name] Date of Service: 09/17/2016 1:30 PM Medical Record Number: [id number] Patient Account Number: [id number] Date of Birth/Sex: May 13, 1924 (83 y.o. Female) Treating RN: Carolyne Fiscal, Debi Primary Care Aziel Morgan: Dion Body Other Clinician: Referring Kanika Bungert: Dion Body Treating Deitrick Ferreri/Extender: Frann Rider in Treatment: 7 Vital Signs Time Taken: 13:25 Pulse (bpm): 50 Height (in): 60 Respiratory Rate (breaths/min): 18 Weight (lbs): 116 Blood Pressure (mmHg): 129/55 Body Mass Index (BMI): 22.7 Reference Range: 80 - 120 mg / dl Electronic Signature(s) Signed: 09/17/2016 4:27:45 PM By: Alric Quan Entered By: Alric Quan on 09/17/2016 13:27:42

## 2016-09-18 NOTE — Progress Notes (Addendum)
CARLOS, QUACKENBUSH ([id number]) Visit Report for 09/17/2016 Chief Complaint Document Details Patient Name: Joanne Michael, Joanne Michael. Date of Service: 09/17/2016 1:30 PM Medical Record Number: [id number] Patient Account Number: [id number] Date of Birth/Sex: [birth year] (83 y.o. Female) Treating RN: Ahmed Prima Primary Care Provider: Dion Body Other Clinician: Referring Provider: Dion Body Treating Provider/Extender: Frann Rider in Treatment: 7 Information Obtained from: Patient Chief Complaint Patient presents to the wound care center due with non-wound condition(s) the left lower extremity Electronic Signature(s) Signed: 09/17/2016 1:36:10 PM By: Christin Fudge MD, FACS Entered By: Christin Fudge on 09/17/2016 13:36:10 Joanne Michael ([id number]) -------------------------------------------------------------------------------- HPI Details Patient Name: Joanne Michael Date of Service: 09/17/2016 1:30 PM Medical Record Number: [id number] Patient Account Number: [id number] Date of Birth/Sex: 1925/02/01 (83 y.o. Female) Treating RN: Carolyne Fiscal, Debi Primary Care Provider: Dion Body Other Clinician: Referring Provider: Dion Body Treating Provider/Extender: Frann Rider in Treatment: 7 History of Present Illness Location: Patient presents with an ulcer on the left lower extremity in the anterior lateral calf area Quality: Patient reports experiencing a throbbing pain to affected area intermittently Severity: Patient states wound are getting better Duration: Patient has had the wound for > 2 months prior to seeking treatment at the wound center Timing: Pain in wound is Intermittent (comes and goes Context: The wound occurred when the patient had a blunt trauma to the legs and had a large swelling Modifying Factors: Other treatment(s) tried include:local care with antibiotic ointment Associated Signs and Symptoms: Patient reports having  increase swelling. HPI Description: 83 year old patient referred to [place] by Ardyth Man, PA, for a ulcer on the left lower extremity, with cellulitis caused by trauma 2 months ago. past medical history of atrial fibrillation, chronic renal failure stage II, essential hypertension, and deficiency anemia, osteoporosis and spinal stenosis. She is status post appendectomy, gallbladder surgery, hernia repair, hysterectomy, knee replacement. She was recently placed on doxycycline. 08/06/2016 -- she has not been using her lymphedema pumps and I have urged her to do this. She will be going off to the beach for a week and will see as the week after 08/20/16 on evaluation today patient's wound of the left lower extremity appears to be doing very well. She is been tolerating the dressing changes without complication and she is pleased with how this is progressing. There is no evidence of infection. 09/03/16 on evaluation today patient appears to be doing well in regard to her wound. Unfortunately she missed her appointment last week because she was actually the hospital due to pneumonia. She has done well in recovery following that time and they took care for wounded very well for her during the time that she was in the hospital. She has no other worsening issues and no evidence of infection. 09/10/16 Patient's wound on the left lower extremity appears to be doing significantly better on evaluation today. There is some slight hyper granular tissue but overall this is showing good signs of improvement which is good news. She is pleased with how this is progressing. Electronic Signature(s) Signed: 09/17/2016 1:36:16 PM By: Christin Fudge MD, FACS Entered By: Christin Fudge on 09/17/2016 13:36:16 Joanne Michael ([id number]) -------------------------------------------------------------------------------- Physical Exam Details Patient Name: Joanne Michael Date of Service: 09/17/2016 1:30 PM Medical Record  Number: [id number] Patient Account Number: [id number] Date of Birth/Sex: [birth year] (83 y.o. Female) Treating RN: Ahmed Prima Primary Care Provider: Dion Body Other Clinician: Referring Provider: Dion Body Treating Provider/Extender: Frann Rider in Treatment: 7 Constitutional . Pulse regular. Respirations normal  and unlabored. Afebrile. . Eyes Nonicteric. Reactive to light. Ears, Nose, Mouth, and Throat Lips, teeth, and gums WNL.Marland Kitchen Moist mucosa without lesions. Neck supple and nontender. No palpable supraclavicular or cervical adenopathy. Normal sized without goiter. Respiratory WNL. No retractions.. Cardiovascular Pedal Pulses WNL. No clubbing, cyanosis or edema. Lymphatic No adneopathy. No adenopathy. No adenopathy. Musculoskeletal Adexa without tenderness or enlargement.. Digits and nails w/o clubbing, cyanosis, infection, petechiae, ischemia, or inflammatory conditions.. Integumentary (Hair, Skin) No suspicious lesions. No crepitus or fluctuance. No peri-wound warmth or erythema. No masses.Marland Kitchen Psychiatric Judgement and insight Intact.. No evidence of depression, anxiety, or agitation.. Notes the wound on the left lower extremity is completely healed and her lymphedema is much better. Electronic Signature(s) Signed: 09/17/2016 1:36:43 PM By: Christin Fudge MD, FACS Entered By: Christin Fudge on 09/17/2016 13:36:42 Joanne Michael ([id number]) -------------------------------------------------------------------------------- Physician Orders Details Patient Name: Joanne Michael Date of Service: 09/17/2016 1:30 PM Medical Record Number: [id number] Patient Account Number: [id number] Date of Birth/Sex: [birth year] (83 y.o. Female) Treating RN: Carolyne Fiscal, Debi Primary Care Provider: Dion Body Other Clinician: Referring Provider: Dion Body Treating Provider/Extender: Frann Rider in Treatment: 7 Verbal / Phone Orders:  Yes Clinician: Carolyne Fiscal, Debi Read Back and Verified: Yes Diagnosis Coding Discharge From [facility] Services o Discharge from [place] area, also keep area clean and dry. Please call our office if you have any questions or concerns. Electronic Signature(s) Signed: 09/17/2016 4:15:42 PM By: Christin Fudge MD, FACS Signed: 09/17/2016 4:27:45 PM By: Alric Quan Entered By: Alric Quan on 09/17/2016 13:36:44 Joanne Michael ([id number]) -------------------------------------------------------------------------------- Problem List Details Patient Name: Joanne Michael Date of Service: 09/17/2016 1:30 PM Medical Record Number: [id number] Patient Account Number: [id number] Date of Birth/Sex: [birth year] (83 y.o. Female) Treating RN: Carolyne Fiscal, Debi Primary Care Provider: Dion Body Other Clinician: Referring Provider: Dion Body Treating Provider/Extender: Frann Rider in Treatment: 7 Active Problems ICD-10 Encounter Code Description Active Date Diagnosis I89.0 Lymphedema, not elsewhere classified 07/26/2016 Yes L97.222 Non-pressure chronic ulcer of left calf with fat layer 07/26/2016 Yes exposed Inactive Problems Resolved Problems Electronic Signature(s) Signed: 09/17/2016 1:35:58 PM By: Christin Fudge MD, FACS Entered By: Christin Fudge on 09/17/2016 13:35:57 Joanne Michael ([id number]) -------------------------------------------------------------------------------- Progress Note Details Patient Name: Joanne Michael Date of Service: 09/17/2016 1:30 PM Medical Record Number: [id number] Patient Account Number: [id number] Date of Birth/Sex: 09-25-1924 (83 y.o. Female) Treating RN: Carolyne Fiscal, Debi Primary Care Provider: Dion Body Other Clinician: Referring Provider: Dion Body Treating Provider/Extender: Frann Rider in Treatment: 7 Subjective Chief Complaint Information obtained from Patient Patient  presents to the wound care center due with non-wound condition(s) the left lower extremity History of Present Illness (HPI) The following HPI elements were documented for the patient's wound: Location: Patient presents with an ulcer on the left lower extremity in the anterior lateral calf area Quality: Patient reports experiencing a throbbing pain to affected area intermittently Severity: Patient states wound are getting better Duration: Patient has had the wound for > 2 months prior to seeking treatment at the wound center Timing: Pain in wound is Intermittent (comes and goes Context: The wound occurred when the patient had a blunt trauma to the legs and had a large swelling Modifying Factors: Other treatment(s) tried include:local care with antibiotic ointment Associated Signs and Symptoms: Patient reports having increase swelling. 83 year old patient referred to [place] by Ardyth Man, PA, for a ulcer on the left lower extremity, with cellulitis caused by trauma 2 months ago. past medical history of atrial  fibrillation, chronic renal failure stage II, essential hypertension, and deficiency anemia, osteoporosis and spinal stenosis. She is status post appendectomy, gallbladder surgery, hernia repair, hysterectomy, knee replacement. She was recently placed on doxycycline. 08/06/2016 -- she has not been using her lymphedema pumps and I have urged her to do this. She will be going off to the beach for a week and will see as the week after 08/20/16 on evaluation today patient's wound of the left lower extremity appears to be doing very well. She is been tolerating the dressing changes without complication and she is pleased with how this is progressing. There is no evidence of infection. 09/03/16 on evaluation today patient appears to be doing well in regard to her wound. Unfortunately she missed her appointment last week because she was actually the hospital due to pneumonia. She has done well in  recovery following that time and they took care for wounded very well for her during the time that she was in the hospital. She has no other worsening issues and no evidence of infection. 09/10/16 Patient's wound on the left lower extremity appears to be doing significantly better on evaluation today. There is some slight hyper granular tissue but overall this is showing good signs of improvement which is good news. She is pleased with how this is progressing. Joanne Michael, Joanne Michael ([id number]) Objective Constitutional Pulse regular. Respirations normal and unlabored. Afebrile. Vitals Time Taken: 1:25 PM, Height: 60 in, Weight: 116 lbs, BMI: 22.7, Pulse: 50 bpm, Respiratory Rate: 18 breaths/min, Blood Pressure: 129/55 mmHg. Eyes Nonicteric. Reactive to light. Ears, Nose, Mouth, and Throat Lips, teeth, and gums WNL.Marland Kitchen Moist mucosa without lesions. Neck supple and nontender. No palpable supraclavicular or cervical adenopathy. Normal sized without goiter. Respiratory WNL. No retractions.. Cardiovascular Pedal Pulses WNL. No clubbing, cyanosis or edema. Lymphatic No adneopathy. No adenopathy. No adenopathy. Musculoskeletal Adexa without tenderness or enlargement.. Digits and nails w/o clubbing, cyanosis, infection, petechiae, ischemia, or inflammatory conditions.Marland Kitchen Psychiatric Judgement and insight Intact.. No evidence of depression, anxiety, or agitation.. General Notes: the wound on the left lower extremity is completely healed and her lymphedema is much better. Integumentary (Hair, Skin) No suspicious lesions. No crepitus or fluctuance. No peri-wound warmth or erythema. No masses.. Wound #1 status is Open. Original cause of wound was Trauma. The wound is located on the Left,Lateral Lower Leg. The wound measures 0cm length x 0cm width x 0cm depth; 0cm^2 area and 0cm^3 volume. Joanne Michael, Joanne Michael ([id number]) There is Fat Layer (Subcutaneous Tissue) Exposed exposed. There is no tunneling or  undermining noted. There is a none present amount of drainage noted. The wound margin is flat and intact. There is no granulation within the wound bed. There is no necrotic tissue within the wound bed. The periwound skin appearance exhibited: Scarring, Ecchymosis. The periwound skin appearance did not exhibit: Callus, Crepitus, Excoriation, Induration, Rash, Dry/Scaly, Maceration, Atrophie Blanche, Cyanosis, Hemosiderin Staining, Mottled, Pallor, Rubor, Erythema. Periwound temperature was noted as No Abnormality. The periwound has tenderness on palpation. Assessment Active Problems ICD-10 I89.0 - Lymphedema, not elsewhere classified L97.222 - Non-pressure chronic ulcer of left calf with fat layer exposed Plan Discharge From [facility] Services: Discharge from [place] area, also keep area clean and dry. Please call our office if you have any questions or concerns. the wound is completely healed and I have asked her to continue protecting this area with a bordered foam and making sure she has some compression stockings on for the lymphedema. She is discharged from  the wound care services and we will see her back only if needed Electronic Signature(s) Signed: 09/17/2016 1:37:54 PM By: Christin Fudge MD, FACS Entered By: Christin Fudge on 09/17/2016 13:37:54 Joanne Michael ([id number]) -------------------------------------------------------------------------------- SuperBill Details Patient Name: Joanne Michael Date of Service: 09/17/2016 Medical Record Number: [id number] Patient Account Number: [id number] Date of Birth/Sex: 24-Nov-1924 (83 y.o. Female) Treating RN: Carolyne Fiscal, Debi Primary Care Provider: Dion Body Other Clinician: Referring Provider: Dion Body Treating Provider/Extender: Frann Rider in Treatment: 7 Diagnosis Coding ICD-10 Codes Code Description I89.0 Lymphedema, not elsewhere classified L97.222 Non-pressure chronic ulcer of  left calf with fat layer exposed Facility Procedures CPT4 Code: [id number] Description: [phone] - WOUND CARE VISIT-LEV 2 EST PT Modifier: Quantity: 1 Physician Procedures CPT4 Code: 3570177 Description: 93903 - WC PHYS LEVEL 3 - EST PT ICD-10 Description Diagnosis I89.0 Lymphedema, not elsewhere classified L97.222 Non-pressure chronic ulcer of left calf with fat Modifier: layer exposed Quantity: 1 Electronic Signature(s) Signed: 09/17/2016 4:15:42 PM By: Christin Fudge MD, FACS Signed: 09/17/2016 4:27:45 PM By: Alric Quan Previous Signature: 09/17/2016 1:38:08 PM Version By: Christin Fudge MD, FACS Entered By: Alric Quan on 09/17/2016 14:13:44

## 2016-09-25 ENCOUNTER — Other Ambulatory Visit: Payer: Self-pay | Admitting: Cardiovascular Disease

## 2016-09-25 NOTE — Telephone Encounter (Signed)
Ok to refill Eliquis 2.5 mg tablet? Gollan not original prescriber.

## 2016-10-15 ENCOUNTER — Ambulatory Visit: Payer: Medicare Other | Attending: Family | Admitting: Family

## 2016-10-15 ENCOUNTER — Encounter: Payer: Self-pay | Admitting: Family

## 2016-10-15 VITALS — BP 178/40 | HR 50 | Resp 18 | Ht 62.0 in | Wt 115.4 lb

## 2016-10-15 DIAGNOSIS — I130 Hypertensive heart and chronic kidney disease with heart failure and stage 1 through stage 4 chronic kidney disease, or unspecified chronic kidney disease: Secondary | ICD-10-CM | POA: Insufficient documentation

## 2016-10-15 DIAGNOSIS — Z91013 Allergy to seafood: Secondary | ICD-10-CM | POA: Insufficient documentation

## 2016-10-15 DIAGNOSIS — I10 Essential (primary) hypertension: Secondary | ICD-10-CM

## 2016-10-15 DIAGNOSIS — M1990 Unspecified osteoarthritis, unspecified site: Secondary | ICD-10-CM | POA: Insufficient documentation

## 2016-10-15 DIAGNOSIS — Z886 Allergy status to analgesic agent status: Secondary | ICD-10-CM | POA: Diagnosis not present

## 2016-10-15 DIAGNOSIS — Z96653 Presence of artificial knee joint, bilateral: Secondary | ICD-10-CM | POA: Insufficient documentation

## 2016-10-15 DIAGNOSIS — Z7901 Long term (current) use of anticoagulants: Secondary | ICD-10-CM | POA: Diagnosis not present

## 2016-10-15 DIAGNOSIS — R001 Bradycardia, unspecified: Secondary | ICD-10-CM | POA: Insufficient documentation

## 2016-10-15 DIAGNOSIS — E785 Hyperlipidemia, unspecified: Secondary | ICD-10-CM | POA: Insufficient documentation

## 2016-10-15 DIAGNOSIS — I4891 Unspecified atrial fibrillation: Secondary | ICD-10-CM | POA: Insufficient documentation

## 2016-10-15 DIAGNOSIS — Z79899 Other long term (current) drug therapy: Secondary | ICD-10-CM | POA: Diagnosis not present

## 2016-10-15 DIAGNOSIS — M109 Gout, unspecified: Secondary | ICD-10-CM | POA: Insufficient documentation

## 2016-10-15 DIAGNOSIS — I5032 Chronic diastolic (congestive) heart failure: Secondary | ICD-10-CM

## 2016-10-15 DIAGNOSIS — D509 Iron deficiency anemia, unspecified: Secondary | ICD-10-CM | POA: Insufficient documentation

## 2016-10-15 DIAGNOSIS — Z85820 Personal history of malignant melanoma of skin: Secondary | ICD-10-CM | POA: Insufficient documentation

## 2016-10-15 DIAGNOSIS — K219 Gastro-esophageal reflux disease without esophagitis: Secondary | ICD-10-CM | POA: Insufficient documentation

## 2016-10-15 DIAGNOSIS — I480 Paroxysmal atrial fibrillation: Secondary | ICD-10-CM

## 2016-10-15 DIAGNOSIS — N189 Chronic kidney disease, unspecified: Secondary | ICD-10-CM | POA: Insufficient documentation

## 2016-10-15 DIAGNOSIS — Z885 Allergy status to narcotic agent status: Secondary | ICD-10-CM | POA: Insufficient documentation

## 2016-10-15 NOTE — Progress Notes (Signed)
Patient ID: Joanne Michael, female    DOB: 02/24/1925, 83 y.o.   MRN: [id number]  HPI  Joanne Michael is a 83 y/o female with a history of spinal stenosis, anemia, HTN, hyperlipidemia, gout, GERD, chronic kidney disease, melanoma, arthritis, atrial fibrillation and chronic heart failure.   Reviewed last echo report from 07/31/16 which showed an EF of 65-70% along with mild/moderate AS/AR and mild MR.   Admitted 08/21/16 due to chest pain, chronic kidney disease and new onset atrial fibrillation. Cardiology consult obtained. Discharged home after 9 days. Admitted 05/05/16 due to pneumonia and hyponatremia. IV antibiotics initially given and then transitioned to oral antibiotics. Sodium level improved after hydration. Discharged home with home PT after 4 days. Was in the ED 05/03/16 due to dysuria. Treated and released. Was in the ED 04/16/16 due to a mechanical fall at home. Treated and released.   She presents today for her follow-up visit with a chief complaint of mild shortness of breath with moderate exertion. She describes this as having been present for several years with varying levels of intensity. Does feel like her shortness of breath is improving though. She has associated fatigue, cough and chronic difficulty sleeping. Weight has been stable at home.   Past Medical History:  Diagnosis Date  . Arrhythmia   . Arthritis   . Cancer ([place])    melanoma  . Cat allergies   . Chronic renal failure   . Fatigue   . GERD (gastroesophageal reflux disease)   . Gout   . H/O Clostridium difficile infection   . Hay fever    as child  . Hiatal hernia   . HLD (hyperlipidemia)   . HTN (hypertension)   . Iron deficiency anemia   . Spinal stenosis    Past Surgical History:  Procedure Laterality Date  . APPENDECTOMY    . BACK SURGERY    . CATARACT EXTRACTION    . CHOLECYSTECTOMY    . feet surgery    . HERNIA REPAIR    . knee replacement- both    . MELANOMA EXCISION    . NOSE SURGERY    . RENAL  ANGIOGRAPHY N/A 05/07/2016   Procedure: Renal Angiography;  Surgeon: Algernon Huxley, MD;  Location: [facility];  Service: Cardiovascular;  Laterality: N/A;  . ROTATOR CUFF REPAIR     left  . SHOULDER SURGERY    . TONSILLECTOMY    . TOTAL ABDOMINAL HYSTERECTOMY     Family History  Problem Relation Age of Onset  . Ovarian cancer Mother   . Liver cancer Father   . Heart failure Father   . Lung cancer Brother   . Prostate cancer Brother   . Lung cancer Sister    Social History  Substance Use Topics  . Smoking status: Never Smoker  . Smokeless tobacco: Never Used     Comment: Tobacco use- no   . Alcohol use 1.8 oz/week    3 Glasses of wine per week   Allergies  Allergen Reactions  . Oxycodone Itching  . Codeine Nausea And Vomiting and Itching  . Hydrocodone-Acetaminophen Other (See Comments)    Hallucinates  . Iodine Swelling, Other (See Comments), Itching and Nausea Only    Patient is allergic to seafood and this is the cause why she can't take medication.  . Meperidine Hcl     Unknown reactions.  . Other Swelling    Seafood- tongue swelling and discoloration Patient allergic to seafood, tongue swelling and  discoloration.  . Propoxyphene N-Acetaminophen Other (See Comments)    Unknown reaction   Prior to Admission medications   Medication Sig Start Date End Date Taking? Authorizing Provider  albuterol (PROVENTIL HFA;VENTOLIN HFA) 108 (90 BASE) MCG/ACT inhaler Inhale 1-2 puffs into the lungs every 6 (six) hours as needed for wheezing or shortness of breath. 02/14/15  Yes Minna Merritts, MD  allopurinol (ZYLOPRIM) 100 MG tablet Take 100 mg by mouth 2 (two) times daily.   Yes [provider]  amiodarone (PACERONE) 200 MG tablet Take 1 tablet (200 mg total) by mouth daily. 09/17/16  Yes Minna Merritts, MD  Ascorbic Acid (VITAMIN C) 1000 MG tablet Take 1,000 mg by mouth daily.   Yes [provider]  atorvastatin (LIPITOR) 20 MG tablet Take 20 mg by  mouth at bedtime.    Yes [provider]  Calcium Carbonate-Vitamin D (CALTRATE 600+D) 600-400 MG-UNIT per tablet Take 1 tablet by mouth daily.     Yes [provider]  carvedilol (COREG) 3.125 MG tablet Take 1 tablet (3.125 mg total) by mouth daily with breakfast. 09/17/16 09/17/17 Yes Gollan, Kathlene November, MD  cetirizine (ZYRTEC) 10 MG chewable tablet Chew 10 mg by mouth daily.     Yes [provider]  cloNIDine (CATAPRES) 0.1 MG tablet Take 1 tablet (0.1 mg total) by mouth 2 (two) times daily. 08/30/16  Yes Epifanio Lesches, MD  collagenase (SANTYL) ointment Apply 1 application topically daily.   Yes [provider]  ELIQUIS 2.5 MG TABS tablet TAKE ONE TABLET BY MOUTH TWICE DAILY 09/25/16  Yes Gollan, Kathlene November, MD  ferrous sulfate 325 (65 FE) MG tablet Take 325 mg by mouth daily with breakfast.   Yes [provider]  fluticasone (FLONASE) 50 MCG/ACT nasal spray Place 2 sprays into both nostrils at bedtime as needed for allergies or rhinitis. 05/25/15  Yes Sudini, Alveta Heimlich, MD  furosemide (LASIX) 20 MG tablet Take 20 mg by mouth See admin instructions. Takes 1 tab on Monday, Wed, and Friday    Yes [provider]  hydrALAZINE (APRESOLINE) 25 MG tablet Take 3 tablets (75 mg total) by mouth every 8 (eight) hours. 08/30/16  Yes Epifanio Lesches, MD  isosorbide mononitrate (IMDUR) 30 MG 24 hr tablet Take 1 tablet (30 mg total) by mouth daily. 08/31/16  Yes Epifanio Lesches, MD  Multiple Vitamin (MULTIVITAMIN) tablet Take 1 tablet by mouth daily.   Yes [provider]  Multiple Vitamins-Minerals (PRESERVISION AREDS 2 PO) Take 1 capsule by mouth daily.   Yes [provider]  pantoprazole (PROTONIX) 40 MG tablet Take 40 mg by mouth at bedtime.  02/14/15  Yes Minna Merritts, MD  Probiotic Product ([place]) Take 1 capsule by mouth daily.   Yes [provider]    Review of Systems  Constitutional: Positive  for appetite change (decreased) and fatigue.  HENT: Positive for rhinorrhea. Negative for congestion and sore throat.   Eyes: Negative.   Respiratory: Positive for cough (dry cough) and shortness of breath (minimal). Negative for chest tightness.   Cardiovascular: Negative for chest pain, palpitations and leg swelling.  Gastrointestinal: Positive for nausea (after eating (improved)). Negative for abdominal distention and abdominal pain.  Endocrine: Negative.   Genitourinary: Negative.   Musculoskeletal: Negative for back pain and neck pain.  Skin: Negative for rash and wound.  Allergic/Immunologic: Negative.   Neurological: Negative for dizziness and light-headedness.  Hematological: Negative for adenopathy. Bruises/bleeds easily.  Psychiatric/Behavioral: Positive for sleep  disturbance (restless sleep; sleeping on 1 pillow). Negative for dysphoric mood. The patient is not nervous/anxious.    Vitals:   10/15/16 1055  BP: (!) 178/40  Pulse: (!) 50  Resp: 18  SpO2: 100%  Weight: 115 lb 6 oz (52.3 kg)  Height: 5\' 2"  (1.575 m)   Wt Readings from Last 3 Encounters:  10/15/16 115 lb 6 oz (52.3 kg)  09/17/16 116 lb 8 oz (52.8 kg)  09/12/16 113 lb 6 oz (51.4 kg)   Lab Results  Component Value Date   CREATININE 1.80 (H) 08/30/2016   CREATININE 2.28 (H) 08/28/2016   CREATININE 2.22 (H) 08/27/2016    Physical Exam  Constitutional: She is oriented to person, place, and time. She appears well-developed and well-nourished.  HENT:  Head: Normocephalic and atraumatic.  Neck: Normal range of motion. Neck supple. No JVD present.  Cardiovascular: Regular rhythm.  Bradycardia present.   Pulmonary/Chest: Effort normal. She has no wheezes.  Abdominal: Soft. She exhibits no distension. There is no tenderness.  Musculoskeletal: She exhibits no edema or tenderness.  Neurological: She is alert and oriented to person, place, and time.  Skin: Skin is warm and dry.  Psychiatric: She has a normal  mood and affect. Her behavior is normal. Thought content normal.  Nursing note and vitals reviewed.  Assessment & Plan:  1: Chronic heart failure with preserved ejection fraction- - NYHA class II - euvolemic today - weighing daily; reminded to call for an overnight weight gain of >2 pounds or a weekly weight gain of >5 pounds - not adding salt and is reading food labels. Reminded to closely follow a 2000mg  sodium diet  - saw her cardiologist Rockey Situ) 09/17/16 - BMP from 08/30/16 reviewed and shows a sodium 129, potassium 4.9 and GFR 24  2: HTN- - systolic BP elevated today even after repeat - encouraged her to get a BP cuff for at home, even if it's the wrist cuff so that she can check it a couple of times a week - saw PCP Netty Starring) 09/10/16  3: Atrial fibrillation- - bradycardic today - on amiodarone and carvedilol  Patient did not bring her medications nor a list. Each medication was verbally reviewed with the patient and she was encouraged to bring the bottles to every visit to confirm accuracy of list.  Return in 6 months or sooner for any questions/problems before then.

## 2016-10-15 NOTE — Patient Instructions (Signed)
Continue weighing daily and call for an overnight weight gain of > 2 pounds or a weekly weight gain of >5 pounds. 

## 2017-01-16 ENCOUNTER — Other Ambulatory Visit: Payer: Self-pay | Admitting: Cardiovascular Disease

## 2017-03-19 ENCOUNTER — Other Ambulatory Visit: Payer: Self-pay | Admitting: Cardiovascular Disease

## 2017-03-19 NOTE — Telephone Encounter (Signed)
Refill Request.  

## 2017-03-22 ENCOUNTER — Encounter: Payer: Self-pay | Admitting: Cardiovascular Disease

## 2017-03-22 NOTE — Telephone Encounter (Signed)
This encounter was created in error - please disregard.

## 2017-04-03 ENCOUNTER — Other Ambulatory Visit: Payer: Self-pay | Admitting: Cardiovascular Disease

## 2017-04-15 ENCOUNTER — Ambulatory Visit: Payer: Medicare Other | Admitting: Family

## 2017-04-17 NOTE — Progress Notes (Signed)
Cardiology Office Note  Date:  04/19/2017   ID:  Joanne Michael, DOB 01-03-1925, MRN [id number]  PCP:  Dion Body, MD   Chief Complaint  Patient presents with  . Other    6 month follow up. Patient c/o SOB. Meds reviewed verbally with patient.     HPI:  Joanne Michael is a pleasant 84 yo woman with  mild aortic valve stenosis in 2011,  GERD,  hyperlipidemia,  hypertension  Previous episode  of near syncope Mild carotid disease Less than 39% b/l disease 11/2015 who presented for lower extremity edema that started in December 2014,   improved symptoms after amlodipine was held She presents today for follow-up of her blood pressure,Leg bruising  echo report from 07/31/16 which showed an EF of 65-70% along with mild/moderate AS/AR and mild MR.   minimal shortness of breath upon moderate exertion fatigue, head congestion, cough, light-headedness, urinary burning and difficulty sleeping at times.   Lots of coughing PND, Using nasal spray, mucinex  BMP from 04/02/17 reviewed and shows sodium 136, potassium 4.6 and GFR 19  CR 2.12, BUN 30 1 Denies any chest pain on exertion No regular exercise program  EKG personally reviewed by myself on todays visit Sinus bradycardia rate 48 bpm no significant ST or T wave changes  Other past medical history admission 6/26 to 08/30/2016  for chest pain elevated troponin chronic kidney disease new onset atrial fibrillation,  Felt to have noncardiac chest pain VQ scan low probability PE Possible left lower lobe pneumonia treated with antibiotics Creatinine 2.28 at peak Plavix discontinued by Dr. Lucky Cowboy On anticoagulation for atrial fibrillation. She converted to normal sinus in the hospital   echocardiogram with normal ejection fraction Mild to moderate aortic valve stenosis   Takes miralex, prunes For constipation Continues to have coughing, relying on her albuterol inhaler Previously seen by pulmonary 2 years ago, has not had  follow-up Still feels congested. Prior history of pneumonias March 2018  Previously seen in the ER seen in the emergency room 05/03/2016 for dysuria, fecal impaction Started on Miramax Seen in the emergency room 04/16/2016 contusions to the face Seen in the emergency room 01/17/2016 urinary retention fecal impaction  Other past medical history reviewed Previous Large hematoma,Left lower extremity  Previous hospital for Hypertensive urgency due to severe Renal artery stenosis right  Renal angioplasty, 3 weeks ago, Dr. dew On asa and plavix   hospital for PNA ,  05/09/2016 RequiredABX,  Low sodium On arrival,Received normal saline, sodium 124 up to 137 Potassium 3.1 Had diarrhea when she got home  Previous leg edema resolved by holding amlodipine Denies having any further episodes of near syncope or syncope  08/08/2014 she was at church when she developed syncope/near syncope. She was in a sitting position. Reports indicate low blood pressure. She was taken to the emergency room, lab work showed elevated BUN and creatinine consistent with dehydration. She was given fluids with improvement of her symptoms. She denied any recent illnesses. Several days prior she had radiofrequency ablation of her back.   Lab work from primary care shows creatinine 2.0, GFR in the 20s she was previously on lisinopril and HCTZ, pill. This was held and she was changed to amlodipine Laboratory from 07/08/2011 shows creatinine 2.1, GFR 22, BUN 21, potassium 3.3  Prior echocardiogram in 2011 showing normal LV systolic function, diastolic relaxation abnormality, mild aortic valve stenosis with no significant gradient, mild MR, high normal right ventricular systolic pressures  PMH:   has a  past medical history of Arrhythmia, Arthritis, Cancer ([place]), Cat allergies, CHF (congestive heart failure) ([place]), Chronic renal failure, Fatigue, GERD (gastroesophageal reflux disease), Gout, H/O Clostridium difficile  infection, Hay fever, Hiatal hernia, HLD (hyperlipidemia), HTN (hypertension), Iron deficiency anemia, and Spinal stenosis.  PSH:    Past Surgical History:  Procedure Laterality Date  . APPENDECTOMY    . BACK SURGERY    . CATARACT EXTRACTION    . CHOLECYSTECTOMY    . feet surgery    . HERNIA REPAIR    . knee replacement- both    . MELANOMA EXCISION    . NOSE SURGERY    . RENAL ANGIOGRAPHY N/A 05/07/2016   Procedure: Renal Angiography;  Surgeon: Algernon Huxley, MD;  Location: [facility];  Service: Cardiovascular;  Laterality: N/A;  . ROTATOR CUFF REPAIR     left  . SHOULDER SURGERY    . TONSILLECTOMY    . TOTAL ABDOMINAL HYSTERECTOMY      Current Outpatient Medications  Medication Sig Dispense Refill  . albuterol (PROVENTIL HFA;VENTOLIN HFA) 108 (90 BASE) MCG/ACT inhaler Inhale 1-2 puffs into the lungs every 6 (six) hours as needed for wheezing or shortness of breath. 1 Inhaler 6  . allopurinol (ZYLOPRIM) 100 MG tablet Take 100 mg by mouth 2 (two) times daily.    Marland Kitchen amiodarone (PACERONE) 200 MG tablet TAKE ONE TABLET EVERY DAY 30 tablet 0  . Ascorbic Acid (VITAMIN C) 1000 MG tablet Take 1,000 mg by mouth daily.    Marland Kitchen atorvastatin (LIPITOR) 20 MG tablet Take 20 mg by mouth at bedtime.     . Calcium Carbonate-Vitamin D (CALTRATE 600+D) 600-400 MG-UNIT per tablet Take 1 tablet by mouth daily.      . carvedilol (COREG) 3.125 MG tablet Take 1 tablet (3.125 mg total) by mouth daily with breakfast. 30 tablet 6  . cetirizine (ZYRTEC) 10 MG chewable tablet Chew 10 mg by mouth daily.      . cloNIDine (CATAPRES) 0.1 MG tablet Take 1 tablet (0.1 mg total) by mouth 2 (two) times daily. 60 tablet 11  . collagenase (SANTYL) ointment Apply 1 application topically daily.    Marland Kitchen ELIQUIS 2.5 MG TABS tablet TAKE ONE TABLET BY MOUTH TWICE DAILY 60 tablet 6  . ferrous sulfate 325 (65 FE) MG tablet Take 325 mg by mouth daily with breakfast.    . fluticasone (FLONASE) 50 MCG/ACT nasal spray Place 2  sprays into both nostrils at bedtime as needed for allergies or rhinitis. 16 g 0  . furosemide (LASIX) 20 MG tablet TAKE ONE TABLET BY MOUTH EVERY DAY AS NEEDED 30 tablet 3  . guaiFENesin (MUCINEX) 600 MG 12 hr tablet Take by mouth 2 (two) times daily.    . hydrALAZINE (APRESOLINE) 25 MG tablet Take 3 tablets (75 mg total) by mouth every 8 (eight) hours. 60 tablet 0  . isosorbide mononitrate (IMDUR) 30 MG 24 hr tablet Take 1 tablet (30 mg total) by mouth daily. 30 tablet 0  . Multiple Vitamin (MULTIVITAMIN) tablet Take 1 tablet by mouth daily.    . Multiple Vitamins-Minerals (PRESERVISION AREDS 2 PO) Take 1 capsule by mouth daily.    . pantoprazole (PROTONIX) 40 MG tablet Take 40 mg by mouth at bedtime.  30 tablet 11  . Probiotic Product (PHILLIPS COLON HEALTH PO) Take 1 capsule by mouth daily.     No current facility-administered medications for this visit.      Allergies:   Oxycodone; Codeine; Hydrocodone-acetaminophen; Iodine; Meperidine hcl; Other;  and Propoxyphene n-acetaminophen   Social History:  The patient  reports that  has never smoked. she has never used smokeless tobacco. She reports that she drinks about 1.8 oz of alcohol per week. She reports that she does not use drugs.   Family History:   family history includes Heart failure in her father; Liver cancer in her father; Lung cancer in her brother and sister; Ovarian cancer in her mother; Prostate cancer in her brother.    Review of Systems: Review of Systems  Constitutional: Negative.   HENT: Positive for congestion.   Respiratory: Positive for cough.   Cardiovascular: Negative.   Gastrointestinal: Negative.   Musculoskeletal: Negative.   Neurological: Negative.   Psychiatric/Behavioral: Negative.   All other systems reviewed and are negative.    PHYSICAL EXAM: VS:  BP 130/60 (BP Location: Left Arm, Patient Position: Sitting, Cuff Size: Normal)   Pulse (!) 48   Ht 5\' 2"  (1.575 m)   Wt 114 lb (51.7 kg)   BMI  20.85 kg/m  , BMI Body mass index is 20.85 kg/m. Constitutional:  oriented to person, place, and time. No distress.  HENT:  Head: Normocephalic and atraumatic.  Eyes:  no discharge. No scleral icterus.  Neck: Normal range of motion. Neck supple. No JVD present.  Cardiovascular: Normal rate, regular rhythm, normal heart sounds and intact distal pulses. Exam reveals no gallop and no friction rub. No edema 2/6 systolic ejection murmur heard right sternal border Pulmonary/Chest: Effort normal and breath sounds normal. No stridor. No respiratory distress.  no wheezes.  no rales.  no tenderness.  Abdominal: Soft.  no distension.  no tenderness.  Musculoskeletal: Normal range of motion.  no  tenderness or deformity.  Neurological:  normal muscle tone. Coordination normal. No atrophy Skin: Skin is warm and dry. No rash noted. not diaphoretic.  Psychiatric:  normal mood and affect. behavior is normal. Thought content normal.    Recent Labs: 08/21/2016: B Natriuretic Peptide 1,207.0 08/23/2016: Magnesium 1.9; TSH 6.375 08/24/2016: ALT 17 08/30/2016: BUN 48; Creatinine, Ser 1.80; Hemoglobin 9.7; Platelets 483; Potassium 4.9; Sodium 129    Lipid Panel Lab Results  Component Value Date   CHOL 118 05/06/2016   HDL 46 05/06/2016   LDLCALC 54 05/06/2016   TRIG 89 05/06/2016      Wt Readings from Last 3 Encounters:  04/19/17 114 lb (51.7 kg)  04/18/17 115 lb 4 oz (52.3 kg)  10/15/16 115 lb 6 oz (52.3 kg)       ASSESSMENT AND PLAN:  Paroxysmal atrial fibrillation She appreciated one evening with palpitations when she put her hand on her chest otherwise denies any tachycardia or palpitations concerning for atrial fibrillation Compliant with her anticoagulation Will monitor heart rate for now, Bradycardic, asymptomatic, no change in the past several years typically runs around 50 bpm  Mixed hyperlipidemia - Cholesterol is at goal on the current lipid regimen. No changes to the medications  were made.  Stable  Essential hypertension, benign - Plan: EKG 12-Lead, ECHOCARDIOGRAM COMPLETE We will monitor heart rate Blood pressure is well controlled on today's visit. No changes made to the medications. She is asymptomatic  Aortic valve stenosis, etiology of cardiac valve disease unspecified -  Mild to moderate aortic valve stenosis,  Consider repeat echocardiogram 2020  Chronic renal impairment, stage 2 (mild) -  Followed by nephrology  Chronic cough Secondary to postnasal drip, sinus congestion recommend she try Zyrtec, humidifier   Renal artery stenosis ([place]) stent placement by Dr. dew  ,  on eliquis Stable  Fatigue   likely still recovering from recent hospitalization also with bradycardia Medication changes as above   Disposition:   F/U  6 months   Total encounter time more than 25 minutes  Greater than 50% was spent in counseling and coordination of care with the patient    Orders Placed This Encounter  Procedures  . EKG 12-Lead     Signed, Esmond Plants, M.D., Ph.D. 04/19/2017  [place], [place]

## 2017-04-18 ENCOUNTER — Encounter: Payer: Self-pay | Admitting: Family

## 2017-04-18 ENCOUNTER — Ambulatory Visit: Payer: Medicare Other | Attending: Family | Admitting: Family

## 2017-04-18 VITALS — BP 157/96 | HR 56 | Resp 18 | Ht 62.0 in | Wt 115.2 lb

## 2017-04-18 DIAGNOSIS — Z9889 Other specified postprocedural states: Secondary | ICD-10-CM | POA: Diagnosis not present

## 2017-04-18 DIAGNOSIS — I130 Hypertensive heart and chronic kidney disease with heart failure and stage 1 through stage 4 chronic kidney disease, or unspecified chronic kidney disease: Secondary | ICD-10-CM | POA: Diagnosis not present

## 2017-04-18 DIAGNOSIS — Z79899 Other long term (current) drug therapy: Secondary | ICD-10-CM | POA: Diagnosis not present

## 2017-04-18 DIAGNOSIS — I10 Essential (primary) hypertension: Secondary | ICD-10-CM

## 2017-04-18 DIAGNOSIS — M1990 Unspecified osteoarthritis, unspecified site: Secondary | ICD-10-CM | POA: Diagnosis not present

## 2017-04-18 DIAGNOSIS — D631 Anemia in chronic kidney disease: Secondary | ICD-10-CM | POA: Diagnosis not present

## 2017-04-18 DIAGNOSIS — Z8249 Family history of ischemic heart disease and other diseases of the circulatory system: Secondary | ICD-10-CM | POA: Insufficient documentation

## 2017-04-18 DIAGNOSIS — Z8041 Family history of malignant neoplasm of ovary: Secondary | ICD-10-CM | POA: Diagnosis not present

## 2017-04-18 DIAGNOSIS — N189 Chronic kidney disease, unspecified: Secondary | ICD-10-CM | POA: Insufficient documentation

## 2017-04-18 DIAGNOSIS — R001 Bradycardia, unspecified: Secondary | ICD-10-CM | POA: Diagnosis not present

## 2017-04-18 DIAGNOSIS — Z90710 Acquired absence of both cervix and uterus: Secondary | ICD-10-CM | POA: Diagnosis not present

## 2017-04-18 DIAGNOSIS — I5032 Chronic diastolic (congestive) heart failure: Secondary | ICD-10-CM

## 2017-04-18 DIAGNOSIS — Z9849 Cataract extraction status, unspecified eye: Secondary | ICD-10-CM | POA: Diagnosis not present

## 2017-04-18 DIAGNOSIS — E785 Hyperlipidemia, unspecified: Secondary | ICD-10-CM | POA: Diagnosis not present

## 2017-04-18 DIAGNOSIS — Z9049 Acquired absence of other specified parts of digestive tract: Secondary | ICD-10-CM | POA: Insufficient documentation

## 2017-04-18 DIAGNOSIS — Z800 Family history of malignant neoplasm of digestive organs: Secondary | ICD-10-CM | POA: Diagnosis not present

## 2017-04-18 DIAGNOSIS — I4891 Unspecified atrial fibrillation: Secondary | ICD-10-CM | POA: Diagnosis not present

## 2017-04-18 DIAGNOSIS — Z801 Family history of malignant neoplasm of trachea, bronchus and lung: Secondary | ICD-10-CM | POA: Diagnosis not present

## 2017-04-18 DIAGNOSIS — Z85820 Personal history of malignant melanoma of skin: Secondary | ICD-10-CM | POA: Diagnosis not present

## 2017-04-18 DIAGNOSIS — Z885 Allergy status to narcotic agent status: Secondary | ICD-10-CM | POA: Insufficient documentation

## 2017-04-18 DIAGNOSIS — M109 Gout, unspecified: Secondary | ICD-10-CM | POA: Insufficient documentation

## 2017-04-18 DIAGNOSIS — I480 Paroxysmal atrial fibrillation: Secondary | ICD-10-CM

## 2017-04-18 DIAGNOSIS — Z96653 Presence of artificial knee joint, bilateral: Secondary | ICD-10-CM | POA: Diagnosis not present

## 2017-04-18 DIAGNOSIS — Z8042 Family history of malignant neoplasm of prostate: Secondary | ICD-10-CM | POA: Insufficient documentation

## 2017-04-18 DIAGNOSIS — Z888 Allergy status to other drugs, medicaments and biological substances status: Secondary | ICD-10-CM | POA: Diagnosis not present

## 2017-04-18 DIAGNOSIS — K219 Gastro-esophageal reflux disease without esophagitis: Secondary | ICD-10-CM | POA: Diagnosis not present

## 2017-04-18 DIAGNOSIS — Z91013 Allergy to seafood: Secondary | ICD-10-CM | POA: Diagnosis not present

## 2017-04-18 NOTE — Progress Notes (Signed)
Patient ID: Joanne Michael, female    DOB: [birth year], 84 y.o.   MRN: [id number]  HPI  Ms Alvizo is a 84 y/o female with a history of spinal stenosis, anemia, HTN, hyperlipidemia, gout, GERD, chronic kidney disease, melanoma, arthritis, atrial fibrillation and chronic heart failure.   Reviewed last echo report from 07/31/16 which showed an EF of 65-70% along with mild/moderate AS/AR and mild MR.   Has not been admitted or been in the ED in the last 6 months.   She presents today for her follow-up visit with a chief complaint of minimal shortness of breath upon moderate exertion. She describes this as chronic in nature having been present for several years. She has associated fatigue, head congestion, cough, light-headedness, urinary burning and difficulty sleeping at times. She denies any chest pain, edema, palpitations, abdominal distention or weight gain. Currently being treated for her urinary burning and has been evaluated by nephrology and PCP about this. Was given estradiol vaginal cream to use twice weekly. Patient reports slight improvement of her symptoms.   Past Medical History:  Diagnosis Date  . Arrhythmia   . Arthritis   . Cancer ([place])    melanoma  . Cat allergies   . Chronic renal failure   . Fatigue   . GERD (gastroesophageal reflux disease)   . Gout   . H/O Clostridium difficile infection   . Hay fever    as child  . Hiatal hernia   . HLD (hyperlipidemia)   . HTN (hypertension)   . Iron deficiency anemia   . Spinal stenosis    Past Surgical History:  Procedure Laterality Date  . APPENDECTOMY    . BACK SURGERY    . CATARACT EXTRACTION    . CHOLECYSTECTOMY    . feet surgery    . HERNIA REPAIR    . knee replacement- both    . MELANOMA EXCISION    . NOSE SURGERY    . RENAL ANGIOGRAPHY N/A 05/07/2016   Procedure: Renal Angiography;  Surgeon: Algernon Huxley, MD;  Location: [facility];  Service: Cardiovascular;  Laterality: N/A;  . ROTATOR CUFF REPAIR      left  . SHOULDER SURGERY    . TONSILLECTOMY    . TOTAL ABDOMINAL HYSTERECTOMY     Family History  Problem Relation Age of Onset  . Ovarian cancer Mother   . Liver cancer Father   . Heart failure Father   . Lung cancer Brother   . Prostate cancer Brother   . Lung cancer Sister    Social History   Tobacco Use  . Smoking status: Never Smoker  . Smokeless tobacco: Never Used  . Tobacco comment: Tobacco use- no   Substance Use Topics  . Alcohol use: Yes    Alcohol/week: 1.8 oz    Types: 3 Glasses of wine per week   Allergies  Allergen Reactions  . Oxycodone Itching  . Codeine Nausea And Vomiting and Itching  . Hydrocodone-Acetaminophen Other (See Comments)    Hallucinates  . Iodine Swelling, Other (See Comments), Itching and Nausea Only    Patient is allergic to seafood and this is the cause why she can't take medication.  . Meperidine Hcl     Unknown reactions.  . Other Swelling    Seafood- tongue swelling and discoloration Patient allergic to seafood, tongue swelling and discoloration.  . Propoxyphene N-Acetaminophen Other (See Comments)    Unknown reaction   Prior to Admission medications   Medication  Sig Start Date End Date Taking? Authorizing Provider  albuterol (PROVENTIL HFA;VENTOLIN HFA) 108 (90 BASE) MCG/ACT inhaler Inhale 1-2 puffs into the lungs every 6 (six) hours as needed for wheezing or shortness of breath. 02/14/15  Yes Minna Merritts, MD  allopurinol (ZYLOPRIM) 100 MG tablet Take 100 mg by mouth 2 (two) times daily.   Yes [provider]  amiodarone (PACERONE) 200 MG tablet TAKE ONE TABLET EVERY DAY 04/03/17  Yes Gollan, Kathlene November, MD  Ascorbic Acid (VITAMIN C) 1000 MG tablet Take 1,000 mg by mouth daily.   Yes [provider]  atorvastatin (LIPITOR) 20 MG tablet Take 20 mg by mouth at bedtime.    Yes [provider]  Calcium Carbonate-Vitamin D (CALTRATE 600+D) 600-400 MG-UNIT per tablet Take 1 tablet by mouth daily.     Yes  [provider]  carvedilol (COREG) 3.125 MG tablet Take 1 tablet (3.125 mg total) by mouth daily with breakfast. 09/17/16 09/17/17 Yes Gollan, Kathlene November, MD  cetirizine (ZYRTEC) 10 MG chewable tablet Chew 10 mg by mouth daily.     Yes [provider]  cloNIDine (CATAPRES) 0.1 MG tablet Take 1 tablet (0.1 mg total) by mouth 2 (two) times daily. 08/30/16  Yes Epifanio Lesches, MD  collagenase (SANTYL) ointment Apply 1 application topically daily.   Yes [provider]  ELIQUIS 2.5 MG TABS tablet TAKE ONE TABLET BY MOUTH TWICE DAILY 03/20/17  Yes Gollan, Kathlene November, MD  ferrous sulfate 325 (65 FE) MG tablet Take 325 mg by mouth daily with breakfast.   Yes [provider]  fluticasone (FLONASE) 50 MCG/ACT nasal spray Place 2 sprays into both nostrils at bedtime as needed for allergies or rhinitis. 05/25/15  Yes Sudini, Alveta Heimlich, MD  furosemide (LASIX) 20 MG tablet TAKE ONE TABLET BY MOUTH EVERY DAY AS NEEDED 01/16/17  Yes Gollan, Kathlene November, MD  guaiFENesin (MUCINEX) 600 MG 12 hr tablet Take by mouth 2 (two) times daily.   Yes [provider]  hydrALAZINE (APRESOLINE) 25 MG tablet Take 3 tablets (75 mg total) by mouth every 8 (eight) hours. 08/30/16  Yes Epifanio Lesches, MD  isosorbide mononitrate (IMDUR) 30 MG 24 hr tablet Take 1 tablet (30 mg total) by mouth daily. 08/31/16  Yes Epifanio Lesches, MD  Multiple Vitamin (MULTIVITAMIN) tablet Take 1 tablet by mouth daily.   Yes [provider]  Multiple Vitamins-Minerals (PRESERVISION AREDS 2 PO) Take 1 capsule by mouth daily.   Yes [provider]  pantoprazole (PROTONIX) 40 MG tablet Take 40 mg by mouth at bedtime.  02/14/15  Yes Minna Merritts, MD  Probiotic Product ([place]) Take 1 capsule by mouth daily.   Yes [provider]   Review of Systems  Constitutional: Positive for fatigue. Negative for appetite change.  HENT: Positive for congestion and  rhinorrhea. Negative for sore throat.   Eyes: Negative.   Respiratory: Positive for cough (dry cough) and shortness of breath (minimal). Negative for chest tightness.   Cardiovascular: Negative for chest pain, palpitations and leg swelling.  Gastrointestinal: Negative for abdominal distention and abdominal pain.  Endocrine: Negative.   Genitourinary: Positive for dysuria ("burning"). Negative for hematuria.  Musculoskeletal: Negative for back pain and neck pain.  Skin: Negative for rash and wound.  Allergic/Immunologic: Negative.   Neurological: Positive for light-headedness (at times). Negative for dizziness.  Hematological: Negative for adenopathy. Bruises/bleeds easily.  Psychiatric/Behavioral: Positive for sleep disturbance (restless sleep; sleeping on 1 pillow). Negative for dysphoric mood.  The patient is not nervous/anxious.    Vitals:   04/18/17 1055  BP: (!) 157/96  Pulse: (!) 56  Resp: 18  SpO2: 99%  Weight: 115 lb 4 oz (52.3 kg)  Height: 5\' 2"  (1.575 m)   Wt Readings from Last 3 Encounters:  04/18/17 115 lb 4 oz (52.3 kg)  10/15/16 115 lb 6 oz (52.3 kg)  09/17/16 116 lb 8 oz (52.8 kg)   Lab Results  Component Value Date   CREATININE 1.80 (H) 08/30/2016   CREATININE 2.28 (H) 08/28/2016   CREATININE 2.22 (H) 08/27/2016    Physical Exam  Constitutional: She is oriented to person, place, and time. She appears well-developed and well-nourished.  HENT:  Head: Normocephalic and atraumatic.  Neck: Normal range of motion. Neck supple. No JVD present.  Cardiovascular: Regular rhythm. Bradycardia present.  Pulmonary/Chest: Effort normal. She has no wheezes.  Abdominal: Soft. She exhibits no distension. There is no tenderness.  Musculoskeletal: She exhibits no edema or tenderness.  Neurological: She is alert and oriented to person, place, and time.  Skin: Skin is warm and dry.  Psychiatric: She has a normal mood and affect. Her behavior is normal. Thought content normal.   Nursing note and vitals reviewed.  Assessment & Plan:  1: Chronic heart failure with preserved ejection fraction- - NYHA class II - euvolemic today - weighing daily; reminded to call for an overnight weight gain of >2 pounds or a weekly weight gain of >5 pounds - weight stable from last time she was here six months ago - not adding salt and is reading food labels. Reminded to closely follow a 2000mg  sodium diet  - saw her cardiologist Rockey Situ) 09/17/16 and returns tomorrow - BMP from 04/02/17 reviewed and shows sodium 136, potassium 4.6 and GFR 19 - already received flu vaccine for this season  2: HTN- - systolic BP mildly elevated but improved from last visit - checking BP at home and says that it's been "ok" - patient has intermittent dizziness and is 84 years old so may need to have BP higher to prevent worsening dizziness possibly leading to a fall - saw PCP Netty Starring) 04/09/17 - recently saw nephrologist Candiss Norse)  3: Atrial fibrillation- - bradycardic today - on amiodarone and carvedilol - continues to take apixaban  Patient did not bring her medications nor a list. Each medication was verbally reviewed with the patient and she was encouraged to bring the bottles to every visit to confirm accuracy of list.  Return in 6 months or sooner for any questions/problems before then.

## 2017-04-18 NOTE — Patient Instructions (Signed)
Continue weighing daily and call for an overnight weight gain of > 2 pounds or a weekly weight gain of >5 pounds. 

## 2017-04-19 ENCOUNTER — Ambulatory Visit: Payer: Medicare Other | Admitting: Cardiovascular Disease

## 2017-04-19 ENCOUNTER — Encounter: Payer: Self-pay | Admitting: Cardiovascular Disease

## 2017-04-19 VITALS — BP 130/60 | HR 48 | Ht 62.0 in | Wt 114.0 lb

## 2017-04-19 DIAGNOSIS — I701 Atherosclerosis of renal artery: Secondary | ICD-10-CM

## 2017-04-19 DIAGNOSIS — I5032 Chronic diastolic (congestive) heart failure: Secondary | ICD-10-CM | POA: Diagnosis not present

## 2017-04-19 DIAGNOSIS — I10 Essential (primary) hypertension: Secondary | ICD-10-CM

## 2017-04-19 DIAGNOSIS — N183 Chronic kidney disease, stage 3 unspecified: Secondary | ICD-10-CM

## 2017-04-19 DIAGNOSIS — I350 Nonrheumatic aortic (valve) stenosis: Secondary | ICD-10-CM | POA: Diagnosis not present

## 2017-04-19 DIAGNOSIS — I480 Paroxysmal atrial fibrillation: Secondary | ICD-10-CM | POA: Diagnosis not present

## 2017-04-19 DIAGNOSIS — R0989 Other specified symptoms and signs involving the circulatory and respiratory systems: Secondary | ICD-10-CM | POA: Diagnosis not present

## 2017-04-19 NOTE — Patient Instructions (Addendum)
Try a humidifier for coughing overnight   Medication Instructions:   No medication changes made  Labwork:  No new labs needed  Testing/Procedures:  No further testing at this time   Follow-Up: It was a pleasure seeing you in the office today. Please call us if you have new issues that need to be addressed before your next appt.  [phone]  Your physician wants you to follow-up in: 12 months.  You will receive a reminder letter in the mail two months in advance. If you don't receive a letter, please call our office to schedule the follow-up appointment.  If you need a refill on your cardiac medications before your next appointment, please call your pharmacy.  For educational health videos Log in to : [email or web address] Or : [email or web address], password : [email or web address]

## 2017-04-30 ENCOUNTER — Other Ambulatory Visit: Payer: Self-pay | Admitting: Cardiovascular Disease

## 2017-06-04 ENCOUNTER — Ambulatory Visit
Admission: RE | Admit: 2017-06-04 | Discharge: 2017-06-04 | Disposition: A | Discharge status: Home / Self Care | Payer: Medicare Other | Source: Ambulatory Visit | Attending: Nephrology | Admitting: Nephrology

## 2017-06-04 DIAGNOSIS — N390 Urinary tract infection, site not specified: Secondary | ICD-10-CM | POA: Diagnosis present

## 2017-06-04 MED ORDER — LIDOCAINE HCL (PF) 1 % IJ SOLN: 5.0000 mL | Freq: Once | INTRAMUSCULAR | Status: DC

## 2017-06-04 MED ORDER — LIDOCAINE HCL (PF) 1 % IJ SOLN
5.0000 mL | Freq: Once | INTRAMUSCULAR | Status: AC
  Administered 2017-06-04: 2 mL

## 2017-06-04 MED ORDER — CEFTRIAXONE SODIUM 1 G IJ SOLR
1.0000 g | Freq: Once | INTRAMUSCULAR | Status: AC
  Administered 2017-06-04: 1 g via INTRAMUSCULAR
  Filled 2017-06-04: qty 10

## 2017-06-04 MED ORDER — LIDOCAINE HCL (PF) 1 % IJ SOLN
INTRAMUSCULAR | Status: AC
  Administered 2017-06-04: 2 mL
  Filled 2017-06-04: qty 2

## 2017-06-04 NOTE — Discharge Instructions (Signed)
Ceftriaxone injection What is this medicine? CEFTRIAXONE (sef try AX one) is a cephalosporin antibiotic. It is used to treat certain kinds of bacterial infections. It will not work for colds, flu, or other viral infections. This medicine may be used for other purposes; ask your health care provider or pharmacist if you have questions. COMMON BRAND NAME(S): Rocephin What should I tell my health care provider before I take this medicine? They need to know if you have any of these conditions: -any chronic illness -bowel disease, like colitis -both kidney and liver disease -high bilirubin level in newborn patients -an unusual or allergic reaction to ceftriaxone, other cephalosporin or penicillin antibiotics, foods, dyes, or preservatives -pregnant or trying to get pregnant -breast-feeding How should I use this medicine? This medicine is injected into a muscle or infused it into a vein. It is usually given in a medical office or clinic. If you are to give this medicine you will be taught how to inject it. Follow instructions carefully. Use your doses at regular intervals. Do not take your medicine more often than directed. Do not skip doses or stop your medicine early even if you feel better. Do not stop taking except on your doctor's advice. Talk to your pediatrician regarding the use of this medicine in children. Special care may be needed. Overdosage: If you think you have taken too much of this medicine contact a poison control center or emergency room at once. NOTE: This medicine is only for you. Do not share this medicine with others. What if I miss a dose? If you miss a dose, take it as soon as you can. If it is almost time for your next dose, take only that dose. Do not take double or extra doses. What may interact with this medicine? Do not take this medicine with any of the following medications: -intravenous calcium This medicine may also interact with the following medications: -birth  control pills This list may not describe all possible interactions. Give your health care provider a list of all the medicines, herbs, non-prescription drugs, or dietary supplements you use. Also tell them if you smoke, drink alcohol, or use illegal drugs. Some items may interact with your medicine. What should I watch for while using this medicine? Tell your doctor or health care professional if your symptoms do not improve or if they get worse. Do not treat diarrhea with over the counter products. Contact your doctor if you have diarrhea that lasts more than 2 days or if it is severe and watery. If you are being treated for a sexually transmitted disease, avoid sexual contact until you have finished your treatment. Having sex can infect your sexual partner. Calcium may bind to this medicine and cause lung or kidney problems. Avoid calcium products while taking this medicine and for 48 hours after taking the last dose of this medicine. What side effects may I notice from receiving this medicine? Side effects that you should report to your doctor or health care professional as soon as possible: -allergic reactions like skin rash, itching or hives, swelling of the face, lips, or tongue -breathing problems -fever, chills -irregular heartbeat -pain when passing urine -seizures -stomach pain, cramps -unusual bleeding, bruising -unusually weak or tired Side effects that usually do not require medical attention (report to your doctor or health care professional if they continue or are bothersome): -diarrhea -dizzy, drowsy -headache -nausea, vomiting -pain, swelling, irritation where injected -stomach upset -sweating This list may not describe all possible side effects.   Call your doctor for medical advice about side effects. You may report side effects to FDA at 1-800-FDA-1088. Where should I keep my medicine? Keep out of the reach of children. Store at room temperature below 25 degrees C (77  degrees F). Protect from light. Throw away any unused vials after the expiration date. NOTE: This sheet is a summary. It may not cover all possible information. If you have questions about this medicine, talk to your doctor, pharmacist, or health care provider.  2018 Elsevier/Gold Standard (2013-08-31 09:14:54)  

## 2017-06-05 ENCOUNTER — Ambulatory Visit
Admission: RE | Admit: 2017-06-05 | Discharge: 2017-06-05 | Disposition: A | Discharge status: Home / Self Care | Payer: Medicare Other | Source: Ambulatory Visit | Attending: Nephrology | Admitting: Nephrology

## 2017-06-05 DIAGNOSIS — N390 Urinary tract infection, site not specified: Secondary | ICD-10-CM | POA: Diagnosis not present

## 2017-06-05 MED ORDER — CEFTRIAXONE SODIUM 1 G IJ SOLR
1.0000 g | Freq: Once | INTRAMUSCULAR | Status: AC
  Administered 2017-06-05: 1 g via INTRAMUSCULAR
  Filled 2017-06-05: qty 10

## 2017-06-05 MED ORDER — LIDOCAINE HCL (PF) 1 % IJ SOLN
INTRAMUSCULAR | Status: AC
  Administered 2017-06-05: 2 mL
  Filled 2017-06-05: qty 2

## 2017-06-05 MED ORDER — LIDOCAINE HCL (PF) 1 % IJ SOLN: 5.0000 mL | Freq: Once | INTRAMUSCULAR | Status: DC

## 2017-06-05 NOTE — Discharge Instructions (Signed)
Ceftriaxone injection What is this medicine? CEFTRIAXONE (sef try AX one) is a cephalosporin antibiotic. It is used to treat certain kinds of bacterial infections. It will not work for colds, flu, or other viral infections. This medicine may be used for other purposes; ask your health care provider or pharmacist if you have questions. COMMON BRAND NAME(S): Rocephin What should I tell my health care provider before I take this medicine? They need to know if you have any of these conditions: -any chronic illness -bowel disease, like colitis -both kidney and liver disease -high bilirubin level in newborn patients -an unusual or allergic reaction to ceftriaxone, other cephalosporin or penicillin antibiotics, foods, dyes, or preservatives -pregnant or trying to get pregnant -breast-feeding How should I use this medicine? This medicine is injected into a muscle or infused it into a vein. It is usually given in a medical office or clinic. If you are to give this medicine you will be taught how to inject it. Follow instructions carefully. Use your doses at regular intervals. Do not take your medicine more often than directed. Do not skip doses or stop your medicine early even if you feel better. Do not stop taking except on your doctor's advice. Talk to your pediatrician regarding the use of this medicine in children. Special care may be needed. Overdosage: If you think you have taken too much of this medicine contact a poison control center or emergency room at once. NOTE: This medicine is only for you. Do not share this medicine with others. What if I miss a dose? If you miss a dose, take it as soon as you can. If it is almost time for your next dose, take only that dose. Do not take double or extra doses. What may interact with this medicine? Do not take this medicine with any of the following medications: -intravenous calcium This medicine may also interact with the following medications: -birth  control pills This list may not describe all possible interactions. Give your health care provider a list of all the medicines, herbs, non-prescription drugs, or dietary supplements you use. Also tell them if you smoke, drink alcohol, or use illegal drugs. Some items may interact with your medicine. What should I watch for while using this medicine? Tell your doctor or health care professional if your symptoms do not improve or if they get worse. Do not treat diarrhea with over the counter products. Contact your doctor if you have diarrhea that lasts more than 2 days or if it is severe and watery. If you are being treated for a sexually transmitted disease, avoid sexual contact until you have finished your treatment. Having sex can infect your sexual partner. Calcium may bind to this medicine and cause lung or kidney problems. Avoid calcium products while taking this medicine and for 48 hours after taking the last dose of this medicine. What side effects may I notice from receiving this medicine? Side effects that you should report to your doctor or health care professional as soon as possible: -allergic reactions like skin rash, itching or hives, swelling of the face, lips, or tongue -breathing problems -fever, chills -irregular heartbeat -pain when passing urine -seizures -stomach pain, cramps -unusual bleeding, bruising -unusually weak or tired Side effects that usually do not require medical attention (report to your doctor or health care professional if they continue or are bothersome): -diarrhea -dizzy, drowsy -headache -nausea, vomiting -pain, swelling, irritation where injected -stomach upset -sweating This list may not describe all possible side effects.   Call your doctor for medical advice about side effects. You may report side effects to FDA at 1-800-FDA-1088. Where should I keep my medicine? Keep out of the reach of children. Store at room temperature below 25 degrees C (77  degrees F). Protect from light. Throw away any unused vials after the expiration date. NOTE: This sheet is a summary. It may not cover all possible information. If you have questions about this medicine, talk to your doctor, pharmacist, or health care provider.  2018 Elsevier/Gold Standard (2013-08-31 09:14:54)  

## 2017-06-05 NOTE — Progress Notes (Signed)
Pt discharge ambulatory no problems noted with injection- left unit with son-in - law

## 2017-06-06 ENCOUNTER — Ambulatory Visit
Admission: RE | Admit: 2017-06-06 | Discharge: 2017-06-06 | Disposition: A | Discharge status: Home / Self Care | Payer: Medicare Other | Source: Ambulatory Visit | Attending: Nephrology | Admitting: Nephrology

## 2017-06-06 DIAGNOSIS — N390 Urinary tract infection, site not specified: Secondary | ICD-10-CM | POA: Insufficient documentation

## 2017-06-06 MED ORDER — LIDOCAINE HCL (PF) 1 % IJ SOLN: 5.0000 mL | Freq: Once | INTRAMUSCULAR | Status: DC

## 2017-06-06 MED ORDER — LIDOCAINE HCL (PF) 1 % IJ SOLN
INTRAMUSCULAR | Status: AC
  Administered 2017-06-06: 2 mL
  Filled 2017-06-06: qty 2

## 2017-06-06 MED ORDER — CEFTRIAXONE SODIUM 1 G IJ SOLR
1.0000 g | Freq: Once | INTRAMUSCULAR | Status: AC
  Administered 2017-06-06: 1 g via INTRAMUSCULAR
  Filled 2017-06-06: qty 10

## 2017-06-07 ENCOUNTER — Ambulatory Visit
Admission: RE | Admit: 2017-06-07 | Discharge: 2017-06-07 | Disposition: A | Discharge status: Home / Self Care | Payer: Medicare Other | Source: Ambulatory Visit | Attending: Nephrology | Admitting: Nephrology

## 2017-06-07 DIAGNOSIS — N390 Urinary tract infection, site not specified: Secondary | ICD-10-CM | POA: Diagnosis present

## 2017-06-07 MED ORDER — CEFTRIAXONE SODIUM 1 G IJ SOLR
1.0000 g | Freq: Once | INTRAMUSCULAR | Status: AC
  Administered 2017-06-07: 1 g via INTRAMUSCULAR
  Filled 2017-06-07: qty 10

## 2017-06-07 MED ORDER — LIDOCAINE HCL (PF) 1 % IJ SOLN
INTRAMUSCULAR | Status: AC
  Filled 2017-06-07: qty 2

## 2017-06-07 MED ORDER — LIDOCAINE HCL (PF) 1 % IJ SOLN
5.0000 mL | Freq: Once | INTRAMUSCULAR | Status: AC
  Administered 2017-06-07: 5 mL

## 2017-08-27 ENCOUNTER — Other Ambulatory Visit: Payer: Self-pay | Admitting: Cardiovascular Disease

## 2017-08-28 ENCOUNTER — Other Ambulatory Visit: Payer: Self-pay | Admitting: Cardiovascular Disease

## 2017-10-11 ENCOUNTER — Other Ambulatory Visit: Payer: Self-pay | Admitting: Cardiovascular Disease

## 2017-10-17 ENCOUNTER — Ambulatory Visit: Payer: Medicare Other | Attending: Family | Admitting: Family

## 2017-10-17 ENCOUNTER — Encounter: Payer: Self-pay | Admitting: Family

## 2017-10-17 VITALS — BP 150/49 | HR 50 | Resp 18 | Ht 62.0 in | Wt 113.4 lb

## 2017-10-17 DIAGNOSIS — I130 Hypertensive heart and chronic kidney disease with heart failure and stage 1 through stage 4 chronic kidney disease, or unspecified chronic kidney disease: Secondary | ICD-10-CM | POA: Insufficient documentation

## 2017-10-17 DIAGNOSIS — Z7901 Long term (current) use of anticoagulants: Secondary | ICD-10-CM | POA: Insufficient documentation

## 2017-10-17 DIAGNOSIS — Z885 Allergy status to narcotic agent status: Secondary | ICD-10-CM | POA: Diagnosis not present

## 2017-10-17 DIAGNOSIS — Z888 Allergy status to other drugs, medicaments and biological substances status: Secondary | ICD-10-CM | POA: Insufficient documentation

## 2017-10-17 DIAGNOSIS — Z9049 Acquired absence of other specified parts of digestive tract: Secondary | ICD-10-CM | POA: Diagnosis not present

## 2017-10-17 DIAGNOSIS — Z85820 Personal history of malignant melanoma of skin: Secondary | ICD-10-CM | POA: Diagnosis not present

## 2017-10-17 DIAGNOSIS — I5032 Chronic diastolic (congestive) heart failure: Secondary | ICD-10-CM | POA: Insufficient documentation

## 2017-10-17 DIAGNOSIS — Z79899 Other long term (current) drug therapy: Secondary | ICD-10-CM | POA: Insufficient documentation

## 2017-10-17 DIAGNOSIS — I10 Essential (primary) hypertension: Secondary | ICD-10-CM

## 2017-10-17 DIAGNOSIS — E785 Hyperlipidemia, unspecified: Secondary | ICD-10-CM | POA: Diagnosis not present

## 2017-10-17 DIAGNOSIS — K219 Gastro-esophageal reflux disease without esophagitis: Secondary | ICD-10-CM | POA: Insufficient documentation

## 2017-10-17 NOTE — Progress Notes (Signed)
Patient ID: Joanne Michael, female    DOB: January 31, 1925, 84 y.o.   MRN: [id number]  HPI  Joanne Michael is a 84 y/o female with a history of spinal stenosis, anemia, HTN, hyperlipidemia, gout, GERD, chronic kidney disease, melanoma, arthritis, atrial fibrillation and chronic heart failure.   Reviewed last echo report from 07/31/16 which showed an EF of 65-70% along with mild/moderate AS/AR and mild MR.   Has not been admitted or been in the ED in the last 6 months.   She presents today for her follow-up visit with a chief complaint of minimal shortness of breath upon moderate exertion. She says this has been present for several years. She has associated fatigue, cough, pedal edema and easy bruising along with this. She denies any difficulty sleeping, abdominal distention, palpitations, chest pain, dizziness or weight gain.   Past Medical History:  Diagnosis Date  . Arrhythmia   . Arthritis   . Cancer ([place])    melanoma  . Cat allergies   . CHF (congestive heart failure) ([place])   . Chronic renal failure   . Fatigue   . GERD (gastroesophageal reflux disease)   . Gout   . H/O Clostridium difficile infection   . Hay fever    as child  . Hiatal hernia   . HLD (hyperlipidemia)   . HTN (hypertension)   . Iron deficiency anemia   . Spinal stenosis    Past Surgical History:  Procedure Laterality Date  . APPENDECTOMY    . BACK SURGERY    . CATARACT EXTRACTION    . CHOLECYSTECTOMY    . feet surgery    . HERNIA REPAIR    . knee replacement- both    . MELANOMA EXCISION    . NOSE SURGERY    . RENAL ANGIOGRAPHY N/A 05/07/2016   Procedure: Renal Angiography;  Surgeon: Algernon Huxley, MD;  Location: [facility];  Service: Cardiovascular;  Laterality: N/A;  . ROTATOR CUFF REPAIR     left  . SHOULDER SURGERY    . TONSILLECTOMY    . TOTAL ABDOMINAL HYSTERECTOMY     Family History  Problem Relation Age of Onset  . Ovarian cancer Mother   . Liver cancer Father   . Heart failure  Father   . Lung cancer Brother   . Prostate cancer Brother   . Lung cancer Sister    Social History   Tobacco Use  . Smoking status: Never Smoker  . Smokeless tobacco: Never Used  . Tobacco comment: Tobacco use- no   Substance Use Topics  . Alcohol use: Yes    Alcohol/week: 3.0 standard drinks    Types: 3 Glasses of wine per week   Allergies  Allergen Reactions  . Oxycodone Itching  . Codeine Nausea And Vomiting and Itching  . Hydrocodone-Acetaminophen Other (See Comments)    Hallucinates  . Iodine Swelling, Other (See Comments), Itching and Nausea Only    Patient is allergic to seafood and this is the cause why she can't take medication.  . Meperidine Hcl     Unknown reactions.  . Other Swelling    Seafood- tongue swelling and discoloration Patient allergic to seafood, tongue swelling and discoloration.  . Propoxyphene N-Acetaminophen Other (See Comments)    Unknown reaction   Prior to Admission medications   Medication Sig Start Date End Date Taking? Authorizing Provider  albuterol (PROVENTIL HFA;VENTOLIN HFA) 108 (90 BASE) MCG/ACT inhaler Inhale 1-2 puffs into the lungs every 6 (six) hours  as needed for wheezing or shortness of breath. 02/14/15  Yes Minna Merritts, MD  allopurinol (ZYLOPRIM) 100 MG tablet Take 100 mg by mouth 2 (two) times daily.   Yes [provider]  amiodarone (PACERONE) 200 MG tablet TAKE ONE TABLET EVERY DAY 08/27/17  Yes Gollan, Kathlene November, MD  Ascorbic Acid (VITAMIN C) 1000 MG tablet Take 1,000 mg by mouth daily.   Yes [provider]  atorvastatin (LIPITOR) 20 MG tablet Take 20 mg by mouth at bedtime.    Yes [provider]  Calcium Carbonate-Vitamin D (CALTRATE 600+D) 600-400 MG-UNIT per tablet Take 1 tablet by mouth daily.     Yes [provider]  carvedilol (COREG) 3.125 MG tablet TAKE ONE TABLET EVERY DAY WITH BREAKFAST 10/11/17  Yes Gollan, Kathlene November, MD  cetirizine (ZYRTEC) 10 MG chewable tablet Chew 10 mg  by mouth daily.     Yes [provider]  cloNIDine (CATAPRES) 0.1 MG tablet Take 1 tablet (0.1 mg total) by mouth 2 (two) times daily. 08/30/16  Yes Epifanio Lesches, MD  collagenase (SANTYL) ointment Apply 1 application topically daily.   Yes [provider]  ELIQUIS 2.5 MG TABS tablet TAKE ONE TABLET BY MOUTH TWICE DAILY 03/20/17  Yes Gollan, Kathlene November, MD  ferrous sulfate 325 (65 FE) MG tablet Take 325 mg by mouth daily with breakfast.   Yes [provider]  fluticasone (FLONASE) 50 MCG/ACT nasal spray Place 2 sprays into both nostrils at bedtime as needed for allergies or rhinitis. 05/25/15  Yes Sudini, Alveta Heimlich, MD  furosemide (LASIX) 20 MG tablet TAKE ONE TABLET EVERY DAY AS NEEDED 08/28/17  Yes Gollan, Kathlene November, MD  guaiFENesin (MUCINEX) 600 MG 12 hr tablet Take by mouth 2 (two) times daily.   Yes [provider]  hydrALAZINE (APRESOLINE) 25 MG tablet Take 3 tablets (75 mg total) by mouth every 8 (eight) hours. 08/30/16  Yes Epifanio Lesches, MD  isosorbide mononitrate (IMDUR) 30 MG 24 hr tablet Take 1 tablet (30 mg total) by mouth daily. 08/31/16  Yes Epifanio Lesches, MD  Multiple Vitamin (MULTIVITAMIN) tablet Take 1 tablet by mouth daily.   Yes [provider]  Multiple Vitamins-Minerals (PRESERVISION AREDS 2 PO) Take 1 capsule by mouth daily.   Yes [provider]  pantoprazole (PROTONIX) 40 MG tablet Take 40 mg by mouth at bedtime.  02/14/15  Yes Minna Merritts, MD  Probiotic Product ([place]) Take 1 capsule by mouth daily.   Yes [provider]    Review of Systems  Constitutional: Positive for fatigue. Negative for appetite change.  HENT: Positive for congestion and rhinorrhea. Negative for sore throat.   Eyes: Negative.   Respiratory: Positive for cough (dry cough) and shortness of breath (minimal). Negative for chest tightness.   Cardiovascular: Positive for leg swelling (left ankle). Negative for  chest pain and palpitations.  Gastrointestinal: Negative for abdominal distention and abdominal pain.  Endocrine: Negative.   Genitourinary: Negative.   Musculoskeletal: Positive for arthralgias (left shoulder). Negative for back pain and neck pain.  Skin: Negative.   Allergic/Immunologic: Negative.   Neurological: Negative for dizziness and light-headedness.  Hematological: Negative for adenopathy. Bruises/bleeds easily.  Psychiatric/Behavioral: Negative for dysphoric mood and sleep disturbance (sleeping on 1 pillow). The patient is not nervous/anxious.    Vitals:   10/17/17 1059  BP: (!) 150/49  Pulse: (!) 50  Resp: 18  SpO2: 98%  Weight: 113 lb 6 oz (51.4 kg)  Height: 5'  2" (1.575 m)   Wt Readings from Last 3 Encounters:  10/17/17 113 lb 6 oz (51.4 kg)  04/19/17 114 lb (51.7 kg)  04/18/17 115 lb 4 oz (52.3 kg)   Lab Results  Component Value Date   CREATININE 1.80 (H) 08/30/2016   CREATININE 2.28 (H) 08/28/2016   CREATININE 2.22 (H) 08/27/2016    Physical Exam  Constitutional: She is oriented to person, place, and time. She appears well-developed and well-nourished.  HENT:  Head: Normocephalic and atraumatic.  Neck: Normal range of motion. Neck supple. No JVD present.  Cardiovascular: Regular rhythm. Bradycardia present.  Pulmonary/Chest: Effort normal. She has no wheezes.  Abdominal: Soft. She exhibits no distension. There is no tenderness.  Musculoskeletal: She exhibits edema (nonpitting left ankle). She exhibits no tenderness.  Neurological: She is alert and oriented to person, place, and time.  Skin: Skin is warm and dry.  Psychiatric: She has a normal mood and affect. Her behavior is normal. Thought content normal.  Nursing note and vitals reviewed.  Assessment & Plan:  1: Chronic heart failure with preserved ejection fraction- - NYHA class II - euvolemic today - weighing daily; reminded to call for an overnight weight gain of >2 pounds or a weekly weight  gain of >5 pounds - weight down 2 pounds from last visit 6 months ago - not adding salt and is reading food labels. Reminded to closely follow a 2000mg  sodium diet  - saw her cardiologist Rockey Situ) 04/19/17 - BMP from 04/02/17 reviewed and shows sodium 136, potassium 4.6 and GFR 19  2: HTN- - BP mildly elevated today - checking BP at home and says that it's been "ok" - saw PCP Netty Starring) 05/14/17 - recently saw nephrologist Candiss Norse)  Patient did not bring her medications nor a list. Each medication was verbally reviewed with the patient and she was encouraged to bring the bottles to every visit to confirm accuracy of list.  Will not make a return appointment at this time. Advised her that she could call back at anytime to make another appointment.

## 2017-10-17 NOTE — Patient Instructions (Signed)
Continue weighing daily and call for an overnight weight gain of > 2 pounds or a weekly weight gain of >5 pounds. 

## 2017-11-14 ENCOUNTER — Other Ambulatory Visit: Payer: Self-pay | Admitting: Cardiovascular Disease

## 2017-11-14 DIAGNOSIS — Z66 Do not resuscitate: Secondary | ICD-10-CM | POA: Insufficient documentation

## 2017-11-14 NOTE — Telephone Encounter (Signed)
Please review for refill. Thanks!  

## 2017-12-23 ENCOUNTER — Ambulatory Visit: Payer: Medicare Other

## 2017-12-23 ENCOUNTER — Ambulatory Visit: Payer: Medicare Other | Admitting: Podiatry

## 2017-12-23 ENCOUNTER — Encounter: Payer: Self-pay | Admitting: Podiatry

## 2017-12-23 DIAGNOSIS — M722 Plantar fascial fibromatosis: Secondary | ICD-10-CM

## 2017-12-23 DIAGNOSIS — R4689 Other symptoms and signs involving appearance and behavior: Secondary | ICD-10-CM | POA: Diagnosis not present

## 2017-12-24 NOTE — Progress Notes (Signed)
Subjective:  Patient ID: Joanne Michael, female    DOB: 01-02-1925,  MRN: [id number] HPI Chief Complaint  Patient presents with  . Callouses    Patient presents with painful callous on back side of left heel x 2-3 months.  She reports it being sore and stabbing pains when she walks and is in bed.  She has been using vaseline for treatment    84 y.o. female presents with the above complaint.   ROS: Denies fever chills nausea vomiting muscle aches pains calf pain back pain chest pain shortness of breath.  Past Medical History:  Diagnosis Date  . Arrhythmia   . Arthritis   . Cancer ([place])    melanoma  . Cat allergies   . CHF (congestive heart failure) ([place])   . Chronic renal failure   . Fatigue   . GERD (gastroesophageal reflux disease)   . Gout   . H/O Clostridium difficile infection   . Hay fever    as child  . Hiatal hernia   . HLD (hyperlipidemia)   . HTN (hypertension)   . Iron deficiency anemia   . Spinal stenosis    Past Surgical History:  Procedure Laterality Date  . APPENDECTOMY    . BACK SURGERY    . CATARACT EXTRACTION    . CHOLECYSTECTOMY    . feet surgery    . HERNIA REPAIR    . knee replacement- both    . MELANOMA EXCISION    . NOSE SURGERY    . RENAL ANGIOGRAPHY N/A 05/07/2016   Procedure: Renal Angiography;  Surgeon: Algernon Huxley, MD;  Location: [facility];  Service: Cardiovascular;  Laterality: N/A;  . ROTATOR CUFF REPAIR     left  . SHOULDER SURGERY    . TONSILLECTOMY    . TOTAL ABDOMINAL HYSTERECTOMY      Current Outpatient Medications:  .  albuterol (PROVENTIL HFA;VENTOLIN HFA) 108 (90 BASE) MCG/ACT inhaler, Inhale 1-2 puffs into the lungs every 6 (six) hours as needed for wheezing or shortness of breath., Disp: 1 Inhaler, Rfl: 6 .  allopurinol (ZYLOPRIM) 100 MG tablet, Take 100 mg by mouth 2 (two) times daily., Disp: , Rfl:  .  amiodarone (PACERONE) 200 MG tablet, TAKE ONE TABLET EVERY DAY, Disp: 30 tablet, Rfl: 3 .  Ascorbic Acid  (VITAMIN C) 1000 MG tablet, Take 1,000 mg by mouth daily., Disp: , Rfl:  .  atorvastatin (LIPITOR) 20 MG tablet, Take 20 mg by mouth at bedtime. , Disp: , Rfl:  .  Calcium Carbonate-Vitamin D (CALTRATE 600+D) 600-400 MG-UNIT per tablet, Take 1 tablet by mouth daily.  , Disp: , Rfl:  .  carvedilol (COREG) 3.125 MG tablet, TAKE ONE TABLET EVERY DAY WITH BREAKFAST, Disp: 30 tablet, Rfl: 5 .  cetirizine (ZYRTEC) 10 MG chewable tablet, Chew 10 mg by mouth daily.  , Disp: , Rfl:  .  cloNIDine (CATAPRES) 0.1 MG tablet, Take 1 tablet (0.1 mg total) by mouth 2 (two) times daily., Disp: 60 tablet, Rfl: 11 .  collagenase (SANTYL) ointment, Apply 1 application topically daily., Disp: , Rfl:  .  ELIQUIS 2.5 MG TABS tablet, TAKE ONE TABLET BY MOUTH TWICE DAILY, Disp: 60 tablet, Rfl: 6 .  ferrous sulfate 325 (65 FE) MG tablet, Take 325 mg by mouth daily with breakfast., Disp: , Rfl:  .  fluticasone (FLONASE) 50 MCG/ACT nasal spray, Place 2 sprays into both nostrils at bedtime as needed for allergies or rhinitis., Disp: 16 g, Rfl: 0 .  furosemide (LASIX) 20 MG tablet, TAKE ONE TABLET EVERY DAY AS NEEDED, Disp: 30 tablet, Rfl: 3 .  guaiFENesin (MUCINEX) 600 MG 12 hr tablet, Take by mouth 2 (two) times daily., Disp: , Rfl:  .  hydrALAZINE (APRESOLINE) 25 MG tablet, Take 3 tablets (75 mg total) by mouth every 8 (eight) hours., Disp: 60 tablet, Rfl: 0 .  isosorbide mononitrate (IMDUR) 30 MG 24 hr tablet, Take 1 tablet (30 mg total) by mouth daily., Disp: 30 tablet, Rfl: 0 .  Multiple Vitamin (MULTIVITAMIN) tablet, Take 1 tablet by mouth daily., Disp: , Rfl:  .  Multiple Vitamins-Minerals (PRESERVISION AREDS 2 PO), Take 1 capsule by mouth daily., Disp: , Rfl:  .  pantoprazole (PROTONIX) 40 MG tablet, Take 40 mg by mouth at bedtime. , Disp: 30 tablet, Rfl: 11 .  Probiotic Product ([place]), Take 1 capsule by mouth daily., Disp: , Rfl:   Allergies  Allergen Reactions  . Oxycodone Itching  . Codeine  Nausea And Vomiting and Itching  . Hydrocodone-Acetaminophen Other (See Comments)    Hallucinates  . Iodine Swelling, Other (See Comments), Itching and Nausea Only    Patient is allergic to seafood and this is the cause why she can't take medication.  . Meperidine Hcl     Unknown reactions.  . Other Swelling    Seafood- tongue swelling and discoloration Patient allergic to seafood, tongue swelling and discoloration.  . Propoxyphene N-Acetaminophen Other (See Comments)    Unknown reaction   Review of Systems Objective:  There were no vitals filed for this visit.  General: Well developed, nourished, in no acute distress, alert and oriented x3   Dermatological: Skin is warm, dry and supple bilateral. Nails x 10 are well maintained; remaining integument appears unremarkable at this time. There are no open sores, no preulcerative lesions, no rash or signs of infection present.  Reactive hyperkeratosis posterior lateral aspect of the bilateral heels secondary to slip on shoes that she wears constantly I explained to her that this was the issue there are currently no open lesions or wounds.  Vascular: Dorsalis Pedis artery and Posterior Tibial artery pedal pulses are 2/4 bilateral with immedate capillary fill time. Pedal hair growth present. No varicosities and no lower extremity edema present bilateral.   Neruologic: Grossly intact via light touch bilateral. Vibratory intact via tuning fork bilateral. Protective threshold with Semmes Wienstein monofilament intact to all pedal sites bilateral. Patellar and Achilles deep tendon reflexes 2+ bilateral. No Babinski or clonus noted bilateral.   Musculoskeletal: No gross boney pedal deformities bilateral. No pain, crepitus, or limitation noted with foot and ankle range of motion bilateral. Muscular strength 5/5 in all groups tested bilateral.  Gait: Unassisted, Nonantalgic.    Radiographs:  Radiographs taken today do not demonstrate any type of  major osseous abnormalities other than osteoarthritic changes midfoot forefoot and some osteopenia.    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  Plan: Debridement of these lesions today suggest that she change shoes follow-up with me as needed     Max T. Pine[place], [place]

## 2017-12-27 ENCOUNTER — Other Ambulatory Visit: Payer: Self-pay

## 2017-12-27 ENCOUNTER — Other Ambulatory Visit: Payer: Self-pay | Admitting: Cardiovascular Disease

## 2017-12-27 ENCOUNTER — Emergency Department: Payer: Medicare Other

## 2017-12-27 ENCOUNTER — Emergency Department
Admission: EM | Admit: 2017-12-27 | Discharge: 2017-12-27 | Disposition: A | Discharge status: Home / Self Care | Payer: Medicare Other | Attending: Emergency Medicine | Admitting: Emergency Medicine

## 2017-12-27 DIAGNOSIS — N183 Chronic kidney disease, stage 3 (moderate): Secondary | ICD-10-CM | POA: Diagnosis not present

## 2017-12-27 DIAGNOSIS — I130 Hypertensive heart and chronic kidney disease with heart failure and stage 1 through stage 4 chronic kidney disease, or unspecified chronic kidney disease: Secondary | ICD-10-CM | POA: Insufficient documentation

## 2017-12-27 DIAGNOSIS — Z66 Do not resuscitate: Secondary | ICD-10-CM | POA: Insufficient documentation

## 2017-12-27 DIAGNOSIS — I5032 Chronic diastolic (congestive) heart failure: Secondary | ICD-10-CM | POA: Insufficient documentation

## 2017-12-27 DIAGNOSIS — Z7901 Long term (current) use of anticoagulants: Secondary | ICD-10-CM | POA: Diagnosis not present

## 2017-12-27 DIAGNOSIS — Z79899 Other long term (current) drug therapy: Secondary | ICD-10-CM | POA: Insufficient documentation

## 2017-12-27 DIAGNOSIS — J069 Acute upper respiratory infection, unspecified: Secondary | ICD-10-CM | POA: Insufficient documentation

## 2017-12-27 DIAGNOSIS — Z85828 Personal history of other malignant neoplasm of skin: Secondary | ICD-10-CM | POA: Insufficient documentation

## 2017-12-27 DIAGNOSIS — J209 Acute bronchitis, unspecified: Secondary | ICD-10-CM | POA: Insufficient documentation

## 2017-12-27 DIAGNOSIS — Z9049 Acquired absence of other specified parts of digestive tract: Secondary | ICD-10-CM | POA: Insufficient documentation

## 2017-12-27 DIAGNOSIS — R05 Cough: Secondary | ICD-10-CM | POA: Diagnosis present

## 2017-12-27 DIAGNOSIS — Z96653 Presence of artificial knee joint, bilateral: Secondary | ICD-10-CM | POA: Diagnosis not present

## 2017-12-27 LAB — CBC
HCT: 32.2 % — ABNORMAL LOW (ref 36.0–46.0)
Hemoglobin: 10.3 g/dL — ABNORMAL LOW (ref 12.0–15.0)
MCH: 31.7 pg (ref 26.0–34.0)
MCHC: 32 g/dL (ref 30.0–36.0)
MCV: 99.1 fL (ref 80.0–100.0)
PLATELETS: 243 10*3/uL (ref 150–400)
RBC: 3.25 MIL/uL — AB (ref 3.87–5.11)
RDW: 13.9 % (ref 11.5–15.5)
WBC: 6.8 10*3/uL (ref 4.0–10.5)
nRBC: 0 % (ref 0.0–0.2)

## 2017-12-27 LAB — BASIC METABOLIC PANEL
Anion gap: 10 (ref 5–15)
BUN: 56 mg/dL — AB (ref 8–23)
CALCIUM: 9.2 mg/dL (ref 8.9–10.3)
CO2: 26 mmol/L (ref 22–32)
CREATININE: 2.55 mg/dL — AB (ref 0.44–1.00)
Chloride: 100 mmol/L (ref 98–111)
GFR calc Af Amer: 18 mL/min — ABNORMAL LOW (ref >60)
GFR calc non Af Amer: 15 mL/min — ABNORMAL LOW (ref >60)
Glucose, Bld: 124 mg/dL — ABNORMAL HIGH (ref 70–99)
Potassium: 4.9 mmol/L (ref 3.5–5.1)
SODIUM: 136 mmol/L (ref 135–145)

## 2017-12-27 LAB — INFLUENZA PANEL BY PCR (TYPE A & B)
INFLBPCR: NEGATIVE
Influenza A By PCR: NEGATIVE

## 2017-12-27 MED ORDER — ALBUTEROL SULFATE HFA 108 (90 BASE) MCG/ACT IN AERS: 2.0000 | INHALATION_SPRAY | Freq: Four times a day (QID) | RESPIRATORY_TRACT | 2 refills | Status: DC | PRN

## 2017-12-27 MED ORDER — IPRATROPIUM-ALBUTEROL 0.5-2.5 (3) MG/3ML IN SOLN
3.0000 mL | Freq: Once | RESPIRATORY_TRACT | Status: AC
  Administered 2017-12-27: 3 mL via RESPIRATORY_TRACT
  Filled 2017-12-27: qty 3

## 2017-12-27 MED ORDER — AMOXICILLIN-POT CLAVULANATE 875-125 MG PO TABS
1.0000 | ORAL_TABLET | Freq: Once | ORAL | Status: AC
Start: 2017-12-27 — End: 2017-12-27
  Administered 2017-12-27: 1 via ORAL
  Filled 2017-12-27: qty 1

## 2017-12-27 MED ORDER — SODIUM CHLORIDE 0.9 % IV BOLUS
500.0000 mL | Freq: Once | INTRAVENOUS | Status: AC
Start: 2017-12-27 — End: 2017-12-27
  Administered 2017-12-27: 500 mL via INTRAVENOUS

## 2017-12-27 MED ORDER — AMOXICILLIN-POT CLAVULANATE 875-125 MG PO TABS: 1.0000 | ORAL_TABLET | Freq: Two times a day (BID) | ORAL | 0 refills | Status: AC

## 2017-12-27 NOTE — ED Provider Notes (Signed)
[facility] Emergency Department Provider Note   ____________________________________________   First MD Initiated Contact with Patient 12/27/17 1145     (approximate)  I have reviewed the triage vital signs and the nursing notes.   HISTORY  Chief Complaint Cough; Fever; and Chills    HPI Joanne Michael is a 84 y.o. female presents for evaluation of 2 days of cough and congestion  Patient and her daughters at the bedside report for about 2 days now she is had a very thick sounding cough.  Sounds like she is coughing up a lot of phlegm.  Patient also reports she had a slightly sore throat with it.  No nausea vomiting.  Feeling at times like she is having fevers and chills at home.  No pain discomfort or burning with urination.  Family reports no recent antibiotic use and she has not had pneumonia for well over 6 months  He reports she has a frequency of developing pneumonia with similar symptoms.  She did have a flu shot this year  Still eating and drinking well, patient reports she does not feel short of breath or weak but just feels like she is having a cough so much phlegm that times it is making it difficult for her to sleep on her back  No leg swelling, had a laceration to the back of the left leg 2 weeks ago that is healing well plan for suture removal Monday   Past Medical History:  Diagnosis Date  . Arrhythmia   . Arthritis   . Cancer ([place])    melanoma  . Cat allergies   . CHF (congestive heart failure) ([place])   . Chronic renal failure   . Fatigue   . GERD (gastroesophageal reflux disease)   . Gout   . H/O Clostridium difficile infection   . Hay fever    as child  . Hiatal hernia   . HLD (hyperlipidemia)   . HTN (hypertension)   . Iron deficiency anemia   . Spinal stenosis     Patient Active Problem List   Diagnosis Date Noted  . DNR (do not resuscitate) 11/14/2017  . Chronic diastolic heart failure ([place]) 09/12/2016  . Atrial  fibrillation ([place]) 09/12/2016  . CKD (chronic kidney disease), stage III ([place]) 08/23/2016  . Renal artery stenosis ([place]) 05/25/2016  . Iron deficiency anemia 11/21/2015  . Gout, joint 10/10/2015  . Pure hypercholesterolemia 10/10/2015  . Chronic hip pain (Bilateral) 07/13/2015  . Posterior arthritis of hip (Bilateral) 07/13/2015  . Chronic sacroiliac joint pain (Bilateral) 07/13/2015  . Osteoarthritis of sacroiliac joint (Bilateral) 07/13/2015  . Lumbar spinal stenosis 07/13/2015  . Spinal stenosis 04/18/2015  . Osteoporosis, post-menopausal 04/18/2015  . Arthritis, degenerative 04/18/2015  . Combined fat and carbohydrate induced hyperlipemia 04/18/2015  . Anemia, iron deficiency 04/18/2015  . Arthritis urica 04/18/2015  . Gastro-esophageal reflux disease without esophagitis 04/18/2015  . CKD (chronic kidney disease) stage 4, GFR 15-29 ml/min (HCC) 04/18/2015  . Chronic low back pain (Location of Primary Source of Pain) (Bilateral) (R>L) 04/18/2015  . Lumbar facet syndrome (Location of Primary Source of Pain) (Bilateral) (R>L) 04/18/2015  . Lumbar spondylosis 04/18/2015  . Chronic pain 04/18/2015  . Long term current use of opiate analgesic 04/18/2015  . Long term prescription opiate use 04/18/2015  . Opiate use 04/18/2015  . Encounter for therapeutic drug level monitoring 04/18/2015  . Encounter for chronic pain management 04/18/2015  . Chronic lower extremity pain (referred) (Location of Secondary source  of pain) (Bilateral) (R>L) 04/18/2015  . Neurogenic pain 04/18/2015  . Heart murmur, systolic 16/11/9602  . Pulmonary parenchymal mass 12/02/2013  . Lung mass 12/02/2013  . Cough 11/03/2013  . Chronic renal insufficiency 07/22/2013  . Aortic valve stenosis 07/22/2013  . Hyperlipidemia 05/02/2009  . ESSENTIAL HYPERTENSION, BENIGN 05/02/2009  . CHEST PAIN UNSPECIFIED 05/02/2009    Past Surgical History:  Procedure Laterality Date  . APPENDECTOMY    . BACK SURGERY    .  CATARACT EXTRACTION    . CHOLECYSTECTOMY    . feet surgery    . HERNIA REPAIR    . knee replacement- both    . MELANOMA EXCISION    . NOSE SURGERY    . RENAL ANGIOGRAPHY N/A 05/07/2016   Procedure: Renal Angiography;  Surgeon: Algernon Huxley, MD;  Location: [facility];  Service: Cardiovascular;  Laterality: N/A;  . ROTATOR CUFF REPAIR     left  . SHOULDER SURGERY    . TONSILLECTOMY    . TOTAL ABDOMINAL HYSTERECTOMY      Prior to Admission medications   Medication Sig Start Date End Date Taking? Authorizing Provider  albuterol (PROVENTIL HFA;VENTOLIN HFA) 108 (90 BASE) MCG/ACT inhaler Inhale 1-2 puffs into the lungs every 6 (six) hours as needed for wheezing or shortness of breath. 02/14/15   Minna Merritts, MD  albuterol (PROVENTIL HFA;VENTOLIN HFA) 108 (90 Base) MCG/ACT inhaler Inhale 2 puffs into the lungs every 6 (six) hours as needed for wheezing or shortness of breath. 12/27/17   Delman Kitten, MD  allopurinol (ZYLOPRIM) 100 MG tablet Take 100 mg by mouth 2 (two) times daily.    [provider]  amiodarone (PACERONE) 200 MG tablet TAKE ONE TABLET EVERY DAY 08/27/17   Minna Merritts, MD  amoxicillin-clavulanate (AUGMENTIN) 875-125 MG tablet Take 1 tablet by mouth 2 (two) times daily for 10 days. 12/27/17 01/06/18  Delman Kitten, MD  Ascorbic Acid (VITAMIN C) 1000 MG tablet Take 1,000 mg by mouth daily.    [provider]  atorvastatin (LIPITOR) 20 MG tablet Take 20 mg by mouth at bedtime.     [provider]  Calcium Carbonate-Vitamin D (CALTRATE 600+D) 600-400 MG-UNIT per tablet Take 1 tablet by mouth daily.      [provider]  carvedilol (COREG) 3.125 MG tablet TAKE ONE TABLET EVERY DAY WITH BREAKFAST 10/11/17   Minna Merritts, MD  cetirizine (ZYRTEC) 10 MG chewable tablet Chew 10 mg by mouth daily.      [provider]  cloNIDine (CATAPRES) 0.1 MG tablet Take 1 tablet (0.1 mg total) by mouth 2 (two) times daily. 08/30/16    Epifanio Lesches, MD  collagenase (SANTYL) ointment Apply 1 application topically daily.    [provider]  ELIQUIS 2.5 MG TABS tablet TAKE ONE TABLET BY MOUTH TWICE DAILY 11/18/17   Minna Merritts, MD  ferrous sulfate 325 (65 FE) MG tablet Take 325 mg by mouth daily with breakfast.    [provider]  fluticasone (FLONASE) 50 MCG/ACT nasal spray Place 2 sprays into both nostrils at bedtime as needed for allergies or rhinitis. 05/25/15   Hillary Bow, MD  furosemide (LASIX) 20 MG tablet TAKE ONE TABLET EVERY DAY AS NEEDED 08/28/17   Minna Merritts, MD  guaiFENesin (MUCINEX) 600 MG 12 hr tablet Take by mouth 2 (two) times daily.    [provider]  hydrALAZINE (APRESOLINE) 25 MG tablet Take 3 tablets (75 mg total) by mouth every  8 (eight) hours. 08/30/16   Epifanio Lesches, MD  isosorbide mononitrate (IMDUR) 30 MG 24 hr tablet Take 1 tablet (30 mg total) by mouth daily. 08/31/16   Epifanio Lesches, MD  Multiple Vitamin (MULTIVITAMIN) tablet Take 1 tablet by mouth daily.    [provider]  Multiple Vitamins-Minerals (PRESERVISION AREDS 2 PO) Take 1 capsule by mouth daily.    [provider]  pantoprazole (PROTONIX) 40 MG tablet Take 40 mg by mouth at bedtime.  02/14/15   Minna Merritts, MD  Probiotic Product ([place]) Take 1 capsule by mouth daily.    [provider]    Allergies Oxycodone; Codeine; Hydrocodone-acetaminophen; Iodine; Meperidine hcl; Other; and Propoxyphene n-acetaminophen  Family History  Problem Relation Age of Onset  . Ovarian cancer Mother   . Liver cancer Father   . Heart failure Father   . Lung cancer Brother   . Prostate cancer Brother   . Lung cancer Sister     Social History Social History   Tobacco Use  . Smoking status: Never Smoker  . Smokeless tobacco: Never Used  . Tobacco comment: Tobacco use- no   Substance Use Topics  . Alcohol use: Yes    Alcohol/week: 3.0  standard drinks    Types: 3 Glasses of wine per week  . Drug use: No    Review of Systems Constitutional: Feverish and having chills off and on last 2 days.  No measured temperature at home Eyes: No visual changes. ENT: Sore, scratchy throat Cardiovascular: Denies chest pain. Respiratory: Denies shortness of breath.  Frequent congested cough Gastrointestinal: No abdominal pain.   Genitourinary: Negative for dysuria. Musculoskeletal: Negative for back pain. Skin: Negative for rash. Neurological: Negative for headaches, areas of focal weakness or numbness.    ____________________________________________   PHYSICAL EXAM:  VITAL SIGNS: ED Triage Vitals [12/27/17 1117]  Enc Vitals Group     BP (!) 140/38     Pulse Rate 66     Resp 16     Temp 98.6 F (37 C)     Temp Source Oral     SpO2 95 %     Weight 112 lb (50.8 kg)     Height 4\' 11"  (1.499 m)     Head Circumference      Peak Flow      Pain Score 0     Pain Loc      Pain Edu?      Excl. in [place]?     Constitutional: Alert and oriented. Well appearing and in no acute distress.  She is a very pleasant, somewhat frail appearance due to age but in no distress. Eyes: Conjunctivae are normal. Head: Atraumatic. Nose: No congestion/rhinnorhea. Mouth/Throat: Mucous membranes are moist.  Posterior pharynx is slightly injected.  Tonsils surgically absent.  No swelling erythema or abnormal lesions to noted. Neck: No stridor.  Cardiovascular: Normal rate, regular rhythm. Grossly normal heart sounds.  Good peripheral circulation. Respiratory: Normal respiratory effort.  No retractions.  Speaks in full clear sentences and breathes easily with normal oxygen saturation on room air, but does have a central rhonchorous component to respirations.  No focal crackles are noted. Gastrointestinal: Soft and nontender. No distention. Musculoskeletal: No lower extremity tenderness nor edema.  She does have left posterior leg has a small area of  well-healing suture line approximately 3 to 4 cm in length, adherent and appears to be healing well without any surrounding erythema discharge or purulence. Neurologic:  Normal speech  and language. No gross focal neurologic deficits are appreciated.  Skin:  Skin is warm, dry and intact. No rash noted. Psychiatric: Mood and affect are normal. Speech and behavior are normal.  ____________________________________________   LABS (all labs ordered are listed, but only abnormal results are displayed)  Labs Reviewed  CBC - Abnormal; Notable for the following components:      Result Value   RBC 3.25 (*)    Hemoglobin 10.3 (*)    HCT 32.2 (*)    All other components within normal limits  BASIC METABOLIC PANEL - Abnormal; Notable for the following components:   Glucose, Bld 124 (*)    BUN 56 (*)    Creatinine, Ser 2.55 (*)    GFR calc non Af Amer 15 (*)    GFR calc Af Amer 18 (*)    All other components within normal limits  INFLUENZA PANEL BY PCR (TYPE A & B)   ____________________________________________  EKG  Reviewed and entered by me at 1230 Heart rate 50 QRS 100 QTc 440 Sinus bradycardia, nonspecific T wave abnormality seen in V1 and V2, which appears to be chronic in nature without notable change or new ischemic abnormality compared with previous EKG. ____________________________________________  RADIOLOGY  Dg Chest 2 View  Result Date: 12/27/2017 CLINICAL DATA:  Cough fever and chills for 2 days. EXAM: CHEST - 2 VIEW COMPARISON:  Chest x-ray dated 08/21/2016. FINDINGS: Heart size and mediastinal contours are stable. Lungs are hyperexpanded. Lungs are clear. No pleural effusion or pneumothorax seen. No acute or suspicious osseous finding. IMPRESSION: 1. No acute findings.  No evidence of pneumonia or pulmonary edema. 2. Hyperexpanded lungs indicating COPD. Electronically Signed   By: Franki Cabot M.D.   On: 12/27/2017 12:21     Chest x-ray results reviewed by  me  ____________________________________________   PROCEDURES  Procedure(s) performed: None  Procedures  Critical Care performed: No  ____________________________________________   INITIAL IMPRESSION / ASSESSMENT AND PLAN / ED COURSE  Pertinent labs & imaging results that were available during my care of the patient were reviewed by me and considered in my medical decision making (see chart for details).   Patient returns for evaluation of cough, rhonchorous lung sounds.  Clinical symptoms seem to suggest upper respiratory infection and likely bronchitis, no evidence of infiltrate on exam.  Afebrile, normal white count, no evidence of any notable increased work of breathing.  Denies dyspnea.  No cardiac neurologic or vascular symptoms.  No chest pain.  Clinical Course as of Dec 28 1443  Fri Dec 27, 2017  1339 Patient resting comfortably, discussed with patient and family plan to give small amount of IV fluids for hydration, reviewed medications and no pneumonia but given her age we will treat with antibiotic including Augmentin.  She is on amiodarone making be hesitant to place her on azithromycin or Levaquin.  Patient and family understanding of this, they will be able to follow closely with primary care and I reviewed careful return precautions.  She is breathing normally without increased work of breathing at this time.  Reports feeling some improvement after nebulizer therapy with regard to the thickness of her cough and mucus, will prescribe an inhaler at home.   [MQ]    Clinical Course User Index [MQ] Delman Kitten, MD   Return precautions and treatment recommendations and follow-up discussed with the patient who is agreeable with the plan.   ____________________________________________   FINAL CLINICAL IMPRESSION(S) / ED DIAGNOSES  Final diagnoses:  Viral  upper respiratory tract infection  Acute bronchitis, unspecified organism        Note:  This document was  prepared using Dragon voice recognition software and may include unintentional dictation errors       Delman Kitten, MD 12/27/17 1446

## 2017-12-27 NOTE — ED Triage Notes (Signed)
C/o cough, fever and chills X 2 days. Placed mask on patient at time of arrival. Pt alert and oriented X4, active, cooperative, pt in NAD. RR even and unlabored, color WNL.

## 2017-12-27 NOTE — ED Notes (Signed)
Pt placed in room. Family at bedside assisted patient to change into gown. Primary RN notified that patient in room.

## 2018-02-04 ENCOUNTER — Other Ambulatory Visit: Payer: Self-pay | Admitting: Cardiovascular Disease

## 2018-02-28 ENCOUNTER — Encounter: Payer: Medicare Other | Attending: Physician Assistant | Admitting: Physician Assistant

## 2018-02-28 DIAGNOSIS — Z888 Allergy status to other drugs, medicaments and biological substances status: Secondary | ICD-10-CM | POA: Diagnosis not present

## 2018-02-28 DIAGNOSIS — I509 Heart failure, unspecified: Secondary | ICD-10-CM | POA: Insufficient documentation

## 2018-02-28 DIAGNOSIS — L97822 Non-pressure chronic ulcer of other part of left lower leg with fat layer exposed: Secondary | ICD-10-CM | POA: Diagnosis present

## 2018-02-28 DIAGNOSIS — I890 Lymphedema, not elsewhere classified: Secondary | ICD-10-CM | POA: Diagnosis not present

## 2018-02-28 DIAGNOSIS — Z880 Allergy status to penicillin: Secondary | ICD-10-CM | POA: Insufficient documentation

## 2018-02-28 DIAGNOSIS — I110 Hypertensive heart disease with heart failure: Secondary | ICD-10-CM | POA: Insufficient documentation

## 2018-02-28 DIAGNOSIS — E11622 Type 2 diabetes mellitus with other skin ulcer: Secondary | ICD-10-CM | POA: Diagnosis not present

## 2018-02-28 DIAGNOSIS — M109 Gout, unspecified: Secondary | ICD-10-CM | POA: Insufficient documentation

## 2018-02-28 DIAGNOSIS — Z885 Allergy status to narcotic agent status: Secondary | ICD-10-CM | POA: Insufficient documentation

## 2018-02-28 DIAGNOSIS — M1990 Unspecified osteoarthritis, unspecified site: Secondary | ICD-10-CM | POA: Diagnosis not present

## 2018-02-28 DIAGNOSIS — I480 Paroxysmal atrial fibrillation: Secondary | ICD-10-CM | POA: Diagnosis not present

## 2018-02-28 NOTE — Progress Notes (Signed)
GEOFFREY, MANKIN ([id number]) Visit Report for 02/28/2018 Abuse/Suicide Risk Screen Details Patient Name: Joanne Michael, Joanne Michael Date of Service: 02/28/2018 12:30 PM Medical Record Number: [id number] Patient Account Number: [id number] Date of Birth/Sex: 03-Jul-1924 (85 y.o. F) Treating RN: Secundino Ginger Primary Care Nazaret Chea: Dion Body Other Clinician: Referring Severus Brodzinski: Referral, Self Treating Daveon Arpino/Extender: Melburn Hake, Joanne Weeks in Treatment: 0 Abuse/Suicide Risk Screen Items Answer ABUSE/SUICIDE RISK SCREEN: Has anyone close to you tried to hurt or harm you recentlyo No Do you feel uncomfortable with anyone in your familyo No Has anyone forced you do things that you didnot want to doo No Do you have any thoughts of harming yourselfo No Patient displays signs or symptoms of abuse and/or neglect. No Electronic Signature(s) Signed: 02/28/2018 2:48:30 PM By: Secundino Ginger Entered By: Secundino Ginger on 02/28/2018 13:13:57 Joanne Michael ([id number]) -------------------------------------------------------------------------------- Activities of Daily Living Details Patient Name: Joanne Michael Date of Service: 02/28/2018 12:30 PM Medical Record Number: [id number] Patient Account Number: [id number] Date of Birth/Sex: [birth year] (85 y.o. F) Treating RN: Secundino Ginger Primary Care Tylon Kemmerling: Dion Body Other Clinician: Referring Silvanna Ohmer: Referral, Self Treating Versia Mignogna/Extender: Melburn Hake, Joanne Weeks in Treatment: 0 Activities of Daily Living Items Answer Activities of Daily Living (Please select one for each item) Drive Automobile Not Able Take Medications Need Assistance Use Telephone Need Assistance Care for Appearance Need Assistance Use Toilet Need Assistance Bath / Shower Need Assistance Dress Self Need Assistance Feed Self Need Assistance Walk Need Assistance Get In / Out Bed Need Assistance Housework Need Assistance Prepare Meals Need Assistance Handle Money Need  Assistance Shop for Self Need Assistance Electronic Signature(s) Signed: 02/28/2018 2:48:30 PM By: Secundino Ginger Entered By: Secundino Ginger on 02/28/2018 13:15:37 Joanne Michael ([id number]) -------------------------------------------------------------------------------- Education Assessment Details Patient Name: Joanne Michael Date of Service: 02/28/2018 12:30 PM Medical Record Number: [id number] Patient Account Number: [id number] Date of Birth/Sex: 07-13-1924 (85 y.o. F) Treating RN: Secundino Ginger Primary Care Nate Common: Dion Body Other Clinician: Referring Nyla Creason: Referral, Self Treating Debborah Alonge/Extender: Melburn Hake, Joanne Weeks in Treatment: 0 Learning Preferences/Education Level/Primary Language Learning Preference: Explanation, Demonstration Highest Education Level: High School Cognitive Barrier Assessment/Beliefs Language Barrier: No Translator Needed: No Memory Deficit: No Emotional Barrier: No Cultural/Religious Beliefs Affecting Medical Care: No Physical Barrier Assessment Impaired Vision: Yes Glasses Impaired Hearing: Yes Hearing Aid Decreased Hand dexterity: No Knowledge/Comprehension Assessment Knowledge Level: High Comprehension Level: High Ability to understand written High instructions: Ability to understand verbal High instructions: Motivation Assessment Anxiety Level: Calm Cooperation: Cooperative Education Importance: Acknowledges Need Interest in Health Problems: Asks Questions Perception: Coherent Willingness to Engage in Self- High Management Activities: Readiness to Engage in Self- High Management Activities: Electronic Signature(s) Signed: 02/28/2018 2:48:30 PM By: Secundino Ginger Entered By: Secundino Ginger on 02/28/2018 13:16:11 Joanne Michael ([id number]) -------------------------------------------------------------------------------- Fall Risk Assessment Details Patient Name: Joanne Michael Date of Service: 02/28/2018 12:30 PM Medical Record  Number: [id number] Patient Account Number: [id number] Date of Birth/Sex: 10-07-1924 (85 y.o. F) Treating RN: Secundino Ginger Primary Care Dilan Fullenwider: Dion Body Other Clinician: Referring Christofer Shen: Referral, Self Treating Brett Darko/Extender: Melburn Hake, Joanne Weeks in Treatment: 0 Fall Risk Assessment Items Have you had 2 or more falls in the last 12 monthso 0 No Have you had any fall that resulted in injury in the last 12 monthso 0 No FALL RISK ASSESSMENT: History of falling - immediate or within 3 months 0 No Secondary diagnosis 0 No Ambulatory aid None/bed rest/wheelchair/nurse 0 No Crutches/cane/walker 0 No Furniture 0 No IV Access/Saline Lock  0 No Gait/Training Normal/bed rest/immobile 0 No Weak 0 No Impaired 0 No Mental Status Oriented to own ability 0 No Electronic Signature(s) Signed: 02/28/2018 2:48:30 PM By: Secundino Ginger Entered By: Secundino Ginger on 02/28/2018 13:16:55 Joanne Michael ([id number]) -------------------------------------------------------------------------------- Foot Assessment Details Patient Name: Joanne Michael Date of Service: 02/28/2018 12:30 PM Medical Record Number: [id number] Patient Account Number: [id number] Date of Birth/Sex: [birth year] (85 y.o. F) Treating RN: Secundino Ginger Primary Care Rajni Holsworth: Dion Body Other Clinician: Referring Tamieka Rancourt: Referral, Self Treating Zailah Zagami/Extender: Melburn Hake, Joanne Weeks in Treatment: 0 Foot Assessment Items Site Locations + = Sensation present, - = Sensation absent, C = Callus, U = Ulcer R = Redness, W = Warmth, M = Maceration, PU = Pre-ulcerative lesion F = Fissure, S = Swelling, D = Dryness Assessment Right: Left: Other Deformity: No No Prior Foot Ulcer: No No Prior Amputation: No No Charcot Joint: No No Ambulatory Status: Gait: Electronic Signature(s) Signed: 02/28/2018 2:48:30 PM By: Secundino Ginger Entered By: Secundino Ginger on 02/28/2018 13:18:31 Joanne Michael  ([id number]) -------------------------------------------------------------------------------- Nutrition Risk Assessment Details Patient Name: Joanne Michael Date of Service: 02/28/2018 12:30 PM Medical Record Number: [id number] Patient Account Number: [id number] Date of Birth/Sex: April 20, 1924 (85 y.o. F) Treating RN: Secundino Ginger Primary Care Diella Gillingham: Dion Body Other Clinician: Referring Naydeline Morace: Referral, Self Treating Vella Colquitt/Extender: Melburn Hake, Joanne Weeks in Treatment: 0 Height (in): 62 Weight (lbs): 115 Body Mass Index (BMI): 21 Nutrition Risk Assessment Items NUTRITION RISK SCREEN: I have an illness or condition that made me change the kind and/or amount of 0 No food I eat I eat fewer than two meals per day 0 No I eat few fruits and vegetables, or milk products 0 No I have three or more drinks of beer, liquor or wine almost every day 0 No I have tooth or mouth problems that make it hard for me to eat 0 No I don't always have enough money to buy the food I need 0 No I eat alone most of the time 0 No I take three or more different prescribed or over-the-counter drugs a day 0 No Without wanting to, I have lost or gained 10 pounds in the last six months 0 No I am not always physically able to shop, cook and/or feed myself 0 No Nutrition Protocols Good Risk Protocol Moderate Risk Protocol Electronic Signature(s) Signed: 02/28/2018 2:48:30 PM By: Secundino Ginger Entered By: Secundino Ginger on 02/28/2018 13:17:02

## 2018-03-03 ENCOUNTER — Emergency Department: Payer: Medicare Other

## 2018-03-03 ENCOUNTER — Encounter: Payer: Self-pay | Admitting: Intensive Care

## 2018-03-03 ENCOUNTER — Emergency Department
Admission: EM | Admit: 2018-03-03 | Discharge: 2018-03-03 | Disposition: A | Discharge status: Home / Self Care | Payer: Medicare Other | Attending: Emergency Medicine | Admitting: Emergency Medicine

## 2018-03-03 ENCOUNTER — Other Ambulatory Visit: Payer: Self-pay

## 2018-03-03 DIAGNOSIS — Z66 Do not resuscitate: Secondary | ICD-10-CM | POA: Insufficient documentation

## 2018-03-03 DIAGNOSIS — I130 Hypertensive heart and chronic kidney disease with heart failure and stage 1 through stage 4 chronic kidney disease, or unspecified chronic kidney disease: Secondary | ICD-10-CM | POA: Insufficient documentation

## 2018-03-03 DIAGNOSIS — M7918 Myalgia, other site: Secondary | ICD-10-CM

## 2018-03-03 DIAGNOSIS — Z79899 Other long term (current) drug therapy: Secondary | ICD-10-CM | POA: Diagnosis not present

## 2018-03-03 DIAGNOSIS — N183 Chronic kidney disease, stage 3 (moderate): Secondary | ICD-10-CM | POA: Diagnosis not present

## 2018-03-03 DIAGNOSIS — R079 Chest pain, unspecified: Secondary | ICD-10-CM | POA: Insufficient documentation

## 2018-03-03 DIAGNOSIS — Z9049 Acquired absence of other specified parts of digestive tract: Secondary | ICD-10-CM | POA: Insufficient documentation

## 2018-03-03 DIAGNOSIS — Z85828 Personal history of other malignant neoplasm of skin: Secondary | ICD-10-CM | POA: Insufficient documentation

## 2018-03-03 DIAGNOSIS — I5032 Chronic diastolic (congestive) heart failure: Secondary | ICD-10-CM | POA: Diagnosis not present

## 2018-03-03 LAB — BASIC METABOLIC PANEL
Anion gap: 11 (ref 5–15)
BUN: 61 mg/dL — ABNORMAL HIGH (ref 8–23)
CHLORIDE: 101 mmol/L (ref 98–111)
CO2: 23 mmol/L (ref 22–32)
Calcium: 10.3 mg/dL (ref 8.9–10.3)
Creatinine, Ser: 2.54 mg/dL — ABNORMAL HIGH (ref 0.44–1.00)
GFR calc Af Amer: 18 mL/min — ABNORMAL LOW (ref >60)
GFR calc non Af Amer: 16 mL/min — ABNORMAL LOW (ref >60)
Glucose, Bld: 119 mg/dL — ABNORMAL HIGH (ref 70–99)
Potassium: 4.3 mmol/L (ref 3.5–5.1)
Sodium: 135 mmol/L (ref 135–145)

## 2018-03-03 LAB — CBC
HCT: 34 % — ABNORMAL LOW (ref 36.0–46.0)
Hemoglobin: 10.9 g/dL — ABNORMAL LOW (ref 12.0–15.0)
MCH: 32.2 pg (ref 26.0–34.0)
MCHC: 32.1 g/dL (ref 30.0–36.0)
MCV: 100.3 fL — ABNORMAL HIGH (ref 80.0–100.0)
Platelets: 234 10*3/uL (ref 150–400)
RBC: 3.39 MIL/uL — AB (ref 3.87–5.11)
RDW: 13.7 % (ref 11.5–15.5)
WBC: 4.7 10*3/uL (ref 4.0–10.5)
nRBC: 0 % (ref 0.0–0.2)

## 2018-03-03 LAB — TROPONIN I: Troponin I: <0.03 ng/mL (ref <0.03)

## 2018-03-03 NOTE — ED Triage Notes (Signed)
Patient c/o sharp left chest pain that radiated to neck and left arm that started this AM. Patient had spot removed from wound clinic on left chest Friday. HX A-fib and dementia A&O to person, place, and situation. Disoriented to time

## 2018-03-03 NOTE — ED Provider Notes (Addendum)
[facility] Emergency Department Provider Note  ____________________________________________   First MD Initiated Contact with Patient 03/03/18 1003     (approximate)  I have reviewed the triage vital signs and the nursing notes.   HISTORY  Chief Complaint Chest Pain   HPI Joanne Michael is a 85 y.o. female with a history of CHF, atrial fibrillation on carvedilol as well as Eliquis was presented emergency department today with chest pressure since this past Friday after having a mole removed off of her superior and anterior chest.  Then, last night the patient experienced several episodes of sharp/sore left-sided trapezius pain especially with sitting up and moving her neck from side to side.  She states that the pain is a dull soreness at this time.  However, she was concerned because of the several days of chest pressure ever since having the lesion removed on her chest as well as intermittent radiation of the pain to the left arm as well.  She denies any shortness of breath, nausea vomiting or diaphoresis.  Denies any ischemic cardiac disease or history of stents or CABG.  Denies any exertional symptoms over the past several days.  Specifically she denies any worsening pain or shortness of breath with walking or going upstairs.   Past Medical History:  Diagnosis Date  . Arrhythmia   . Arthritis   . Cancer ([place])    melanoma  . Cat allergies   . CHF (congestive heart failure) ([place])   . Chronic renal failure   . Fatigue   . GERD (gastroesophageal reflux disease)   . Gout   . H/O Clostridium difficile infection   . Hay fever    as child  . Hiatal hernia   . HLD (hyperlipidemia)   . HTN (hypertension)   . Iron deficiency anemia   . Spinal stenosis     Patient Active Problem List   Diagnosis Date Noted  . DNR (do not resuscitate) 11/14/2017  . Chronic diastolic heart failure ([place]) 09/12/2016  . Atrial fibrillation ([place]) 09/12/2016  . CKD  (chronic kidney disease), stage III ([place]) 08/23/2016  . Renal artery stenosis ([place]) 05/25/2016  . Iron deficiency anemia 11/21/2015  . Gout, joint 10/10/2015  . Pure hypercholesterolemia 10/10/2015  . Chronic hip pain (Bilateral) 07/13/2015  . Posterior arthritis of hip (Bilateral) 07/13/2015  . Chronic sacroiliac joint pain (Bilateral) 07/13/2015  . Osteoarthritis of sacroiliac joint (Bilateral) 07/13/2015  . Lumbar spinal stenosis 07/13/2015  . Spinal stenosis 04/18/2015  . Osteoporosis, post-menopausal 04/18/2015  . Arthritis, degenerative 04/18/2015  . Combined fat and carbohydrate induced hyperlipemia 04/18/2015  . Anemia, iron deficiency 04/18/2015  . Arthritis urica 04/18/2015  . Gastro-esophageal reflux disease without esophagitis 04/18/2015  . CKD (chronic kidney disease) stage 4, GFR 15-29 ml/min (H[facility]) 04/18/2015  . Chronic low back pain (Location of Primary Source of Pain) (Bilateral) (R>L) 04/18/2015  . Lumbar facet syndrome (Location of Primary Source of Pain) (Bilateral) (R>L) 04/18/2015  . Lumbar spondylosis 04/18/2015  . Chronic pain 04/18/2015  . Long term current use of opiate analgesic 04/18/2015  . Long term prescription opiate use 04/18/2015  . Opiate use 04/18/2015  . Encounter for therapeutic drug level monitoring 04/18/2015  . Encounter for chronic pain management 04/18/2015  . Chronic lower extremity pain (referred) (Location of Secondary source of pain) (Bilateral) (R>L) 04/18/2015  . Neurogenic pain 04/18/2015  . Heart murmur, systolic 51/03/5850  . Pulmonary parenchymal mass 12/02/2013  . Lung mass 12/02/2013  . Cough 11/03/2013  .  Chronic renal insufficiency 07/22/2013  . Aortic valve stenosis 07/22/2013  . Hyperlipidemia 05/02/2009  . ESSENTIAL HYPERTENSION, BENIGN 05/02/2009  . CHEST PAIN UNSPECIFIED 05/02/2009    Past Surgical History:  Procedure Laterality Date  . APPENDECTOMY    . BACK SURGERY    . CATARACT EXTRACTION    . CHOLECYSTECTOMY     . feet surgery    . HERNIA REPAIR    . knee replacement- both    . MELANOMA EXCISION    . NOSE SURGERY    . RENAL ANGIOGRAPHY N/A 05/07/2016   Procedure: Renal Angiography;  Surgeon: Algernon Huxley, MD;  Location: [facility];  Service: Cardiovascular;  Laterality: N/A;  . ROTATOR CUFF REPAIR     left  . SHOULDER SURGERY    . TONSILLECTOMY    . TOTAL ABDOMINAL HYSTERECTOMY      Prior to Admission medications   Medication Sig Start Date End Date Taking? Authorizing Provider  albuterol (PROVENTIL HFA;VENTOLIN HFA) 108 (90 BASE) MCG/ACT inhaler Inhale 1-2 puffs into the lungs every 6 (six) hours as needed for wheezing or shortness of breath. 02/14/15   Minna Merritts, MD  albuterol (PROVENTIL HFA;VENTOLIN HFA) 108 (90 Base) MCG/ACT inhaler Inhale 2 puffs into the lungs every 6 (six) hours as needed for wheezing or shortness of breath. 12/27/17   Delman Kitten, MD  allopurinol (ZYLOPRIM) 100 MG tablet Take 100 mg by mouth 2 (two) times daily.    [provider]  amiodarone (PACERONE) 200 MG tablet TAKE 1 TABLET BY MOUTH DAILY 12/27/17   Minna Merritts, MD  Ascorbic Acid (VITAMIN C) 1000 MG tablet Take 1,000 mg by mouth daily.    [provider]  atorvastatin (LIPITOR) 20 MG tablet Take 20 mg by mouth at bedtime.     [provider]  Calcium Carbonate-Vitamin D (CALTRATE 600+D) 600-400 MG-UNIT per tablet Take 1 tablet by mouth daily.      [provider]  carvedilol (COREG) 3.125 MG tablet TAKE ONE TABLET EVERY DAY WITH BREAKFAST 10/11/17   Minna Merritts, MD  cetirizine (ZYRTEC) 10 MG chewable tablet Chew 10 mg by mouth daily.      [provider]  cloNIDine (CATAPRES) 0.1 MG tablet Take 1 tablet (0.1 mg total) by mouth 2 (two) times daily. 08/30/16   Epifanio Lesches, MD  collagenase (SANTYL) ointment Apply 1 application topically daily.    [provider]  ELIQUIS 2.5 MG TABS tablet TAKE ONE TABLET BY MOUTH TWICE DAILY  11/18/17   Minna Merritts, MD  ferrous sulfate 325 (65 FE) MG tablet Take 325 mg by mouth daily with breakfast.    [provider]  fluticasone (FLONASE) 50 MCG/ACT nasal spray Place 2 sprays into both nostrils at bedtime as needed for allergies or rhinitis. 05/25/15   Hillary Bow, MD  furosemide (LASIX) 20 MG tablet TAKE ONE TABLET DAILY AS NEEDED 02/05/18   Minna Merritts, MD  guaiFENesin (MUCINEX) 600 MG 12 hr tablet Take by mouth 2 (two) times daily.    [provider]  hydrALAZINE (APRESOLINE) 25 MG tablet Take 3 tablets (75 mg total) by mouth every 8 (eight) hours. 08/30/16   Epifanio Lesches, MD  isosorbide mononitrate (IMDUR) 30 MG 24 hr tablet Take 1 tablet (30 mg total) by mouth daily. 08/31/16   Epifanio Lesches, MD  Multiple Vitamin (MULTIVITAMIN) tablet Take 1 tablet by mouth daily.    [provider]  Multiple Vitamins-Minerals (PRESERVISION AREDS 2 PO)  Take 1 capsule by mouth daily.    [provider]  pantoprazole (PROTONIX) 40 MG tablet Take 40 mg by mouth at bedtime.  02/14/15   Minna Merritts, MD  Probiotic Product ([place]) Take 1 capsule by mouth daily.    [provider]    Allergies Oxycodone; Codeine; Hydrocodone-acetaminophen; Iodine; Meperidine hcl; Other; and Propoxyphene n-acetaminophen  Family History  Problem Relation Age of Onset  . Ovarian cancer Mother   . Liver cancer Father   . Heart failure Father   . Lung cancer Brother   . Prostate cancer Brother   . Lung cancer Sister     Social History Social History   Tobacco Use  . Smoking status: Never Smoker  . Smokeless tobacco: Never Used  . Tobacco comment: Tobacco use- no   Substance Use Topics  . Alcohol use: Yes    Alcohol/week: 3.0 standard drinks    Types: 3 Glasses of wine per week  . Drug use: No    Review of Systems  Constitutional: No fever/chills Eyes: No visual changes. ENT: No sore throat. Cardiovascular:  As above Respiratory: Denies shortness of breath. Gastrointestinal: No abdominal pain.  No nausea, no vomiting.  No diarrhea.  No constipation. Genitourinary: Negative for dysuria. Musculoskeletal: Left trapezius pain as above. Skin: Negative for rash. Neurological: Negative for headaches, focal weakness or numbness.   ____________________________________________   PHYSICAL EXAM:  VITAL SIGNS: ED Triage Vitals  Enc Vitals Group     BP 03/03/18 0916 (!) 190/38     Pulse Rate 03/03/18 0916 (!) 47     Resp 03/03/18 0916 16     Temp 03/03/18 0916 (!) 97.5 F (36.4 C)     Temp Source 03/03/18 0916 Oral     SpO2 03/03/18 0916 98 %     Weight 03/03/18 0920 113 lb (51.3 kg)     Height 03/03/18 0920 4\' 11"  (1.499 m)     Head Circumference --      Peak Flow --      Pain Score --      Pain Loc --      Pain Edu? --      Excl. in [place]? --     Constitutional: Alert and oriented. Well appearing and in no acute distress. Eyes: Conjunctivae are normal.  Head: Atraumatic. Nose: No congestion/rhinnorhea. Mouth/Throat: Mucous membranes are moist.  Neck: No stridor.  Tenderness to palpation of the distribution of the left pedis muscle. Cardiovascular: Normal rate, regular rhythm. Grossly normal heart sounds.  Good peripheral circulation with equal and bilateral radial pulses. Respiratory: Normal respiratory effort.  No retractions. Lungs CTAB. Gastrointestinal: Soft and nontender. No distention. No CVA tenderness. Musculoskeletal: No lower extremity edema.  No joint effusions. Neurologic:  Normal speech and language. No gross focal neurologic deficits are appreciated. Skin: Wearing a bandage over the superior and anterior chest wall over the superior aspect of the sternum.  Bandages CDI.  I inspected the biopsy site and there is no surrounding erythema, no exudate or tenderness to palpation. Psychiatric: Mood and affect are normal. Speech and behavior are  normal.  ____________________________________________   LABS (all labs ordered are listed, but only abnormal results are displayed)  Labs Reviewed  BASIC METABOLIC PANEL - Abnormal; Notable for the following components:      Result Value   Glucose, Bld 119 (*)    BUN 61 (*)    Creatinine, Ser 2.54 (*)    GFR calc non Af  Amer 16 (*)    GFR calc Af Amer 18 (*)    All other components within normal limits  CBC - Abnormal; Notable for the following components:   RBC 3.39 (*)    Hemoglobin 10.9 (*)    HCT 34.0 (*)    MCV 100.3 (*)    All other components within normal limits  TROPONIN I   ____________________________________________  EKG  ED ECG REPORT I, Doran Stabler, the attending physician, personally viewed and interpreted this ECG.   Date: 03/03/2018  EKG Time: 0908  Rate: 45  Rhythm: sinus bradycardia  Axis: Axis deviation  Intervals:none  ST&T Change: No ST segment elevation or depression.  Single T wave elevation in aVL.  Likely related to LVH. No significant change from previous.  Patient appears to have a history of bradycardia. ____________________________________________  RADIOLOGY  Chest x-ray without acute process. ____________________________________________   PROCEDURES  Procedure(s) performed:   Procedures  Critical Care performed:   ____________________________________________   INITIAL IMPRESSION / ASSESSMENT AND PLAN / ED COURSE  Pertinent labs & imaging results that were available during my care of the patient were reviewed by me and considered in my medical decision making (see chart for details).  Differential diagnosis includes, but is not limited to, ACS, aortic dissection, pulmonary embolism, cardiac tamponade, pneumothorax, pneumonia, pericarditis, myocarditis, GI-related causes including esophagitis/gastritis, and musculoskeletal chest wall pain.   As part of my medical decision making, I reviewed the following data within  the electronic MEDICAL RECORD NUMBER Notes from prior ED visits  ----------------------------------------- 10:54 AM on 03/03/2018 -----------------------------------------  Patient with reassuring lab work with a negative troponin after several days of the chest pressure which is likely related to the patient's biopsy.  Also, regarding the patient's left-sided neck as well as shoulder pain.  This is likely musculoskeletal.  It is worsened with movement.  Very tender over the left-sided trapezius muscle.  I recommended the patient to use topical treatment such as Aspercreme or icy hot.  She also says that she has a vibrating heating pad that she sometimes uses for back pain.  Patient will follow with Dr. Rockey Situ.  Also, the patient has elevated blood pressure today.  However, has not taken her morning medications.  Creatinine appears to be near baseline.  Will follow with primary care.  She is understanding the diagnosis well treatment and willing to comply. ____________________________________________   FINAL CLINICAL IMPRESSION(S) / ED DIAGNOSES  Chest pain.  Musculoskeletal pain.  NEW MEDICATIONS STARTED DURING THIS VISIT:  New Prescriptions   No medications on file     Note:  This document was prepared using Dragon voice recognition software and may include unintentional dictation errors.     Orbie Pyo, MD 03/03/18 1056    Aniket Paye, Randall An, MD 03/03/18 1057

## 2018-03-07 ENCOUNTER — Encounter: Payer: Medicare Other | Admitting: Physician Assistant

## 2018-03-07 DIAGNOSIS — E11622 Type 2 diabetes mellitus with other skin ulcer: Secondary | ICD-10-CM | POA: Diagnosis not present

## 2018-03-07 NOTE — Progress Notes (Addendum)
[name], [name] ([id number]) Visit Report for 02/28/2018 Biopsy Details Patient Name: Joanne, Michael. Date of Service: 02/28/2018 12:30 PM Medical Record Number: [id number] Patient Account Number: [id number] Date of Birth/Sex: 07-Dec-1924 (85 y.o. F) Treating RN: Harold Barban Primary Care Provider: Dion Body Other Clinician: Referring Provider: Dion Body Treating Provider/Extender: Melburn Hake, Kirby Cortese Weeks in Treatment: 0 Biopsy Performed for: NonWound Condition Other Dermatologic Condition - Chest Location(s): Other: Other Dermatologic Condition - Chest Performed By: Physician STONE III, Myrtle Barnhard E., PA-C Tissue Punch: No Number of Specimens Taken: 1 Specimen Sent To Pathology: Yes Level of Consciousness (Pre-procedure): Awake and Alert Pre-procedure Verification/Time-Out Taken: Yes - 14:15 Pain Control: Lidocaine Injectable Lidocaine Percent: 2% Instrument: Blade, Forceps Hemostasis Achieved: Pressure Procedural Pain: 0 Post Procedural Pain: 0 Response to Treatment: Procedure was tolerated well Level of Consciousness (Post-procedure): Awake and Alert Post Procedure Diagnosis Same as Pre-procedure Electronic Signature(s) Signed: 03/06/2018 10:02:40 AM By: Worthy Keeler PA-C Signed: 03/06/2018 2:41:13 PM By: Harold Barban Entered By: Harold Barban on 02/28/2018 14:23:24 Joanne Michael ([id number]) -------------------------------------------------------------------------------- Chief Complaint Document Details Patient Name: Joanne Michael Date of Service: 02/28/2018 12:30 PM Medical Record Number: [id number] Patient Account Number: [id number] Date of Birth/Sex: [birth year] (85 y.o. F) Treating RN: Montey Hora Primary Care Provider: Dion Body Other Clinician: Referring Provider: Dion Body Treating Provider/Extender: Melburn Hake, Daric Koren Weeks in Treatment: 0 Information Obtained from: Patient Chief Complaint Left LE ulcer due to trauma,  chest wall skin lesion, and heel pain bilateral Electronic Signature(s) Signed: 03/06/2018 10:02:40 AM By: Worthy Keeler PA-C Entered By: Worthy Keeler on 02/28/2018 14:35:54 Joanne Michael ([id number]) -------------------------------------------------------------------------------- Debridement Details Patient Name: Joanne Michael Date of Service: 02/28/2018 12:30 PM Medical Record Number: [id number] Patient Account Number: [id number] Date of Birth/Sex: 07/16/1924 (85 y.o. F) Treating RN: Harold Barban Primary Care Provider: Dion Body Other Clinician: Referring Provider: Dion Body Treating Provider/Extender: Melburn Hake, Blaze Nylund Weeks in Treatment: 0 Debridement Performed for Wound #2 Left,Posterior Lower Leg Assessment: Performed By: Physician STONE III, Bryson Gavia E., PA-C Debridement Type: Debridement Level of Consciousness (Pre- Awake and Alert procedure): Pre-procedure Verification/Time Yes - 14:05 Out Taken: Start Time: 14:05 Pain Control: Lidocaine Total Area Debrided (L x W): 0.4 (cm) x 3 (cm) = 1.2 (cm) Tissue and other material Viable, Eschar, Slough, Subcutaneous, Slough debrided: Level: Skin/Subcutaneous Tissue Debridement Description: Excisional Instrument: Curette Bleeding: None End Time: 14:09 Procedural Pain: 0 Post Procedural Pain: 0 Response to Treatment: Procedure was tolerated well Level of Consciousness Awake and Alert (Post-procedure): Post Debridement Measurements of Total Wound Length: (cm) 0.4 Width: (cm) 3 Depth: (cm) 0.1 Volume: (cm) 0.094 Character of Wound/Ulcer Post Debridement: Improved Post Procedure Diagnosis Same as Pre-procedure Electronic Signature(s) Signed: 03/06/2018 10:02:40 AM By: Worthy Keeler PA-C Signed: 03/06/2018 2:41:13 PM By: Harold Barban Entered By: Harold Barban on 02/28/2018 14:07:13 Joanne Michael  ([id number]) -------------------------------------------------------------------------------- HPI Details Patient Name: Joanne Michael Date of Service: 02/28/2018 12:30 PM Medical Record Number: [id number] Patient Account Number: [id number] Date of Birth/Sex: 09-Oct-1924 (85 y.o. F) Treating RN: Montey Hora Primary Care Provider: Dion Body Other Clinician: Referring Provider: Dion Body Treating Provider/Extender: Melburn Hake, Lavena Loretto Weeks in Treatment: 0 History of Present Illness Location: Patient presents with an ulcer on the left lower extremity in the anterior lateral calf area Quality: Patient reports experiencing a throbbing pain to affected area intermittently Severity: Patient states wound are getting better Duration: Patient has had the wound for > 2 months prior to seeking treatment at the wound  center Timing: Pain in wound is Intermittent (comes and goes Context: The wound occurred when the patient had a blunt trauma to the legs and had a large swelling Modifying Factors: Other treatment(s) tried include:local care with antibiotic ointment Associated Signs and Symptoms: Patient reports having increase swelling. HPI Description: 85 year old patient referred to [place] by Ardyth Man, PA, for a ulcer on the left lower extremity, with cellulitis caused by trauma 2 months ago. past medical history of atrial fibrillation, chronic renal failure stage II, essential hypertension, and deficiency anemia, osteoporosis and spinal stenosis. She is status post appendectomy, gallbladder surgery, hernia repair, hysterectomy, knee replacement. She was recently placed on doxycycline. 08/06/2016 -- she has not been using her lymphedema pumps and I have urged her to do this. She will be going off to the beach for a week and will see as the week after 08/20/16 on evaluation today patient's wound of the left lower extremity appears to be doing very well. She is been tolerating the  dressing changes without complication and she is pleased with how this is progressing. There is no evidence of infection. 09/03/16 on evaluation today patient appears to be doing well in regard to her wound. Unfortunately she missed her appointment last week because she was actually the hospital due to pneumonia. She has done well in recovery following that time and they took care for wounded very well for her during the time that she was in the hospital. She has no other worsening issues and no evidence of infection. 09/10/16 Patient's wound on the left lower extremity appears to be doing significantly better on evaluation today. There is some slight hyper granular tissue but overall this is showing good signs of improvement which is good news. She is pleased with how this is progressing. Readmission: 02/28/18 on evaluation today patient presents for multiple complaints for which she would like evaluation at this point. She has a history of atrial fibrillation, lymphedema, and currently has a wound on the left lower extremity that appears to be secondary to lymphedema. She's also having issues with her bilateral heels which are hurting her at this point although she does not have any open wounds or signs of deep tissue injury at the sites. She states that when she lies in bed on her back that's when it typically hurts but again I see no evidence of pressure injury. It sounds to me that she may be experiencing some wrap up the in this regard. Lastly she has an area on her central chest where there is a lesion that is beginning to become painful to her to touch. She states this came up over the past several months although she doesn't know exactly when she first saw this. There is no sign of fluid underneath this area and it does not appear to be an abscess at all as best I could tell. Nonetheless it does have me somewhat concerned about the possibility of a skin cancer just based on the appearance today.  No fevers, chills, nausea, or vomiting noted at this time. Electronic Signature(s) Signed: 03/06/2018 10:02:40 AM By: Worthy Keeler PA-C Entered By: Worthy Keeler on 03/06/2018 09:53:09 Joanne Michael ([id number]) [address] Burna Sis ([id number]) -------------------------------------------------------------------------------- Physical Exam Details Patient Name: Joanne Michael Date of Service: 02/28/2018 12:30 PM Medical Record Number: [id number] Patient Account Number: [id number] Date of Birth/Sex: Aug 15, 1924 (85 y.o. F) Treating RN: Montey Hora Primary Care Provider: Dion Body Other Clinician: Referring Provider: Dion Body Treating Provider/Extender: Melburn Hake, Sophiamarie Nease  Weeks in Treatment: 0 Constitutional patient is hypertensive.. pulse regular and within target range for patient.Marland Kitchen respirations regular, non-labored and within target range for patient.Marland Kitchen temperature within target range for patient.. Well-nourished and well-hydrated in no acute distress. Eyes conjunctiva clear no eyelid edema noted. pupils equal round and reactive to light and accommodation. Ears, Nose, Mouth, and Throat no gross abnormality of ear auricles or external auditory canals. normal hearing noted during conversation. mucus membranes moist. Respiratory normal breathing without difficulty. clear to auscultation bilaterally. Cardiovascular regular rate and rhythm with normal S1, S2. 1+ dorsalis pedis/posterior tibialis pulses. no clubbing, cyanosis, significant edema, <3 sec cap refill. Gastrointestinal (GI) soft, non-tender, non-distended, +BS. no ventral hernia noted. Musculoskeletal normal gait and posture. no significant deformity or arthritic changes, no loss or range of motion, no clubbing. Psychiatric this patient is able to make decisions and demonstrates good insight into disease process. Alert and Oriented x 3. pleasant and cooperative. Notes Upon inspection the patient's  lesion on the central chest appears to be tender to touch it is hard to palpation and non-mobile. This has an appearance very much like a basal cell carcinoma would. Subsequently based on the current appearance and what things look like this regard I did proceed to do a biopsy of the area and actually being that it was such a small lesion I performed a biopsy to remove the entire area down to what appears to be the base. This entire piece of tissue was set for pathology. Subsequently in regard to her lower extremity wound this did require sharp debridement today which the patient tolerated without complication. Post debridement it appears to be doing much better. Electronic Signature(s) Signed: 03/06/2018 10:02:40 AM By: Worthy Keeler PA-C Entered By: Worthy Keeler on 03/06/2018 09:53:52 Joanne Michael ([id number]) -------------------------------------------------------------------------------- Physician Orders Details Patient Name: Lavena Loretto Michael Date of Service: 02/28/2018 12:30 PM Medical Record Number: [id number] Patient Account Number: [id number] Date of Birth/Sex: October 21, 1924 (85 y.o. F) Treating RN: Harold Barban Primary Care Provider: Dion Body Other Clinician: Referring Provider: Dion Body Treating Provider/Extender: Melburn Hake, Skyla Champagne Weeks in Treatment: 0 Verbal / Phone Orders: No Diagnosis Coding ICD-10 Coding Code Description I89.0 Lymphedema, not elsewhere classified I48.0 Paroxysmal atrial fibrillation Wound Cleansing Wound #2 Left,Posterior Lower Leg o May Shower, gently pat wound dry prior to applying new dressing. Primary Wound Dressing Wound #2 Left,Posterior Lower Leg o Silver Collagen Wound #3 Midline Chest o Silver Collagen Secondary Dressing Wound #2 Left,Posterior Lower Leg o Conform/Kerlix - to leg o Non-adherent pad o Other Wound #3 Midline Chest o Conform/Kerlix - to leg o Non-adherent pad o Other Dressing  Change Frequency Wound #2 Left,Posterior Lower Leg o Change dressing every other day. Follow-up Appointments o Return Appointment in 1 week. Electronic Signature(s) Signed: 03/06/2018 10:02:40 AM By: Worthy Keeler PA-C Signed: 03/06/2018 2:41:13 PM By: Harold Barban Entered By: Harold Barban on 02/28/2018 14:31:12 Alexiss Iturralde Michael ([id number]) -------------------------------------------------------------------------------- Problem List Details Patient Name: Joanne Michael Date of Service: 02/28/2018 12:30 PM Medical Record Number: [id number] Patient Account Number: [id number] Date of Birth/Sex: August 04, 1924 (85 y.o. F) Treating RN: Montey Hora Primary Care Provider: Dion Body Other Clinician: Referring Provider: Dion Body Treating Provider/Extender: Melburn Hake, Shaynna Husby Weeks in Treatment: 0 Active Problems ICD-10 Evaluated Encounter Code Description Active Date Today Diagnosis I89.0 Lymphedema, not elsewhere classified 02/28/2018 No Yes L97.822 Non-pressure chronic ulcer of other part of left lower leg with 02/28/2018 No Yes fat layer exposed L98.9 Disorder of the skin and subcutaneous  tissue, unspecified 02/28/2018 No Yes I48.0 Paroxysmal atrial fibrillation 02/28/2018 No Yes Inactive Problems Resolved Problems Electronic Signature(s) Signed: 03/06/2018 10:02:40 AM By: Worthy Keeler PA-C Entered By: Worthy Keeler on 02/28/2018 14:35:27 Xiadani Damman Michael ([id number]) -------------------------------------------------------------------------------- Progress Note Details Patient Name: Joanne Michael Date of Service: 02/28/2018 12:30 PM Medical Record Number: [id number] Patient Account Number: [id number] Date of Birth/Sex: 1925-01-27 (85 y.o. F) Treating RN: Montey Hora Primary Care Provider: Dion Body Other Clinician: Referring Provider: Dion Body Treating Provider/Extender: Melburn Hake, Kenzie Thoreson Weeks in Treatment: 0 Subjective Chief  Complaint Information obtained from Patient Left LE ulcer due to trauma, chest wall skin lesion, and heel pain bilateral History of Present Illness (HPI) The following HPI elements were documented for the patient's wound: Location: Patient presents with an ulcer on the left lower extremity in the anterior lateral calf area Quality: Patient reports experiencing a throbbing pain to affected area intermittently Severity: Patient states wound are getting better Duration: Patient has had the wound for > 2 months prior to seeking treatment at the wound center Timing: Pain in wound is Intermittent (comes and goes Context: The wound occurred when the patient had a blunt trauma to the legs and had a large swelling Modifying Factors: Other treatment(s) tried include:local care with antibiotic ointment Associated Signs and Symptoms: Patient reports having increase swelling. 85 year old patient referred to [place] by Ardyth Man, PA, for a ulcer on the left lower extremity, with cellulitis caused by trauma 2 months ago. past medical history of atrial fibrillation, chronic renal failure stage II, essential hypertension, and deficiency anemia, osteoporosis and spinal stenosis. She is status post appendectomy, gallbladder surgery, hernia repair, hysterectomy, knee replacement. She was recently placed on doxycycline. 08/06/2016 -- she has not been using her lymphedema pumps and I have urged her to do this. She will be going off to the beach for a week and will see as the week after 08/20/16 on evaluation today patient's wound of the left lower extremity appears to be doing very well. She is been tolerating the dressing changes without complication and she is pleased with how this is progressing. There is no evidence of infection. 09/03/16 on evaluation today patient appears to be doing well in regard to her wound. Unfortunately she missed her appointment last week because she was actually the hospital due to  pneumonia. She has done well in recovery following that time and they took care for wounded very well for her during the time that she was in the hospital. She has no other worsening issues and no evidence of infection. 09/10/16 Patient's wound on the left lower extremity appears to be doing significantly better on evaluation today. There is some slight hyper granular tissue but overall this is showing good signs of improvement which is good news. She is pleased with how this is progressing. Readmission: 02/28/18 on evaluation today patient presents for multiple complaints for which she would like evaluation at this point. She has a history of atrial fibrillation, lymphedema, and currently has a wound on the left lower extremity that appears to be secondary to lymphedema. She's also having issues with her bilateral heels which are hurting her at this point although she does not have any open wounds or signs of deep tissue injury at the sites. She states that when she lies in bed on her back that's when it typically hurts but again I see no evidence of pressure injury. It sounds to me that she may be experiencing some wrap up the  in this regard. Lastly she has an area on her central chest where there is a lesion that is beginning to become painful to her to touch. She states this came up over the past several months although she doesn't know exactly when she first saw this. There is no sign of fluid underneath this area and it does not appear to be an abscess at all as best I could tell. Nonetheless it Joanne Michael, Joanne Michael ([id number]) does have me somewhat concerned about the possibility of a skin cancer just based on the appearance today. No fevers, chills, nausea, or vomiting noted at this time. Wound History Patient reportedly has not tested positive for osteomyelitis. Patient reportedly has not had testing performed to evaluate circulation in the legs. Patient History Information obtained from  Patient. Allergies silicone, adhesive, oxycodone HCl, amlodipine, amoxicillin, codeine, Darvocet-N, Demerol, iodine, meperidine, Vicodin Family History Cancer - Siblings,Mother, Lung Disease - Siblings, Stroke - Siblings, No family history of Hypertension, Kidney Disease, Thyroid Problems, Tuberculosis. Social History Never smoker, Marital Status - Widowed, Alcohol Use - Never, Drug Use - No History, Caffeine Use - Daily. Medical And Surgical History Notes Respiratory mycobacterial pulmonary disease Cardiovascular varicose veins Review of Systems (ROS) Eyes Complains or has symptoms of Glasses / Contacts. Denies complaints or symptoms of Dry Eyes, Vision Changes. Ear/Nose/Mouth/Throat Denies complaints or symptoms of Difficult clearing ears, Sinusitis. Hematologic/Lymphatic Denies complaints or symptoms of Bleeding / Clotting Disorders, Human Immunodeficiency Virus. Respiratory Complains or has symptoms of Shortness of Breath. Denies complaints or symptoms of Chronic or frequent coughs. Cardiovascular Complains or has symptoms of LE edema - bilat. Denies complaints or symptoms of Chest pain. Gastrointestinal Denies complaints or symptoms of Frequent diarrhea, Nausea, Vomiting. Endocrine Denies complaints or symptoms of Hepatitis, Thyroid disease, Polydypsia (Excessive Thirst). Immunological Denies complaints or symptoms of Hives, Itching. Integumentary (Skin) Complains or has symptoms of Bleeding or bruising tendency, Breakdown, Swelling. Musculoskeletal Denies complaints or symptoms of Muscle Pain, Muscle Weakness. Neurologic Denies complaints or symptoms of Numbness/parasthesias, Focal/Weakness. Psychiatric Denies complaints or symptoms of Anxiety. KATRIANNA, FRIESENHAHN ([id number]) Objective Constitutional patient is hypertensive.. pulse regular and within target range for patient.Marland Kitchen respirations regular, non-labored and within target range for patient.Marland Kitchen temperature within  target range for patient.. Well-nourished and well-hydrated in no acute distress. Vitals Time Taken: 12:30 PM, Height: 62 in, Source: Stated, Weight: 115 lbs, Source: Stated, BMI: 21, Temperature: 97.9 F, Pulse: 55 bpm, Respiratory Rate: 20 breaths/min, Blood Pressure: 155/40 mmHg. Eyes conjunctiva clear no eyelid edema noted. pupils equal round and reactive to light and accommodation. Ears, Nose, Mouth, and Throat no gross abnormality of ear auricles or external auditory canals. normal hearing noted during conversation. mucus membranes moist. Respiratory normal breathing without difficulty. clear to auscultation bilaterally. Cardiovascular regular rate and rhythm with normal S1, S2. 1+ dorsalis pedis/posterior tibialis pulses. no clubbing, cyanosis, significant edema, Gastrointestinal (GI) soft, non-tender, non-distended, +BS. no ventral hernia noted. Musculoskeletal normal gait and posture. no significant deformity or arthritic changes, no loss or range of motion, no clubbing. Psychiatric this patient is able to make decisions and demonstrates good insight into disease process. Alert and Oriented x 3. pleasant and cooperative. General Notes: Upon inspection the patient's lesion on the central chest appears to be tender to touch it is hard to palpation and non-mobile. This has an appearance very much like a basal cell carcinoma would. Subsequently based on the current appearance and what things look like this regard I did proceed to do a biopsy of the  area and actually being that it was such a small lesion I performed a biopsy to remove the entire area down to what appears to be the base. This entire piece of tissue was set for pathology. Subsequently in regard to her lower extremity wound this did require sharp debridement today which the patient tolerated without complication. Post debridement it appears to be doing much better. Integumentary (Hair, Skin) Wound #2 status is Open.  Original cause of wound was Trauma. The wound is located on the Left,Posterior Lower Leg. The wound measures 0.4cm length x 3cm width x 0.1cm depth; 0.942cm^2 area and 0.094cm^3 volume. There is no tunneling or undermining noted. There is a none present amount of drainage noted. There is no granulation within the wound bed. There is a large (67-100%) amount of necrotic tissue within the wound bed including Eschar. The periwound skin appearance did not exhibit: Callus, Crepitus, Excoriation, Induration, Rash, Scarring, Dry/Scaly, Maceration, Atrophie Blanche, Cyanosis, Ecchymosis, Hemosiderin Staining, Mottled, Pallor, Rubor, Erythema. Wound #3 status is Open. Original cause of wound was Surgical Injury. The wound is located on the Midline Chest. The wound measures 0.9cm length x 1cm width x 0.2cm depth; 0.707cm^2 area and 0.141cm^3 volume. There is Fat Layer (Subcutaneous Tissue) Exposed exposed. There is no tunneling or undermining noted. There is a medium amount of serosanguineous drainage noted. The wound margin is flat and intact. There is no granulation within the wound bed. There is no necrotic tissue within the wound bed. The periwound skin appearance did not exhibit: Callus, Crepitus, Excoriation, Induration, Rash, Scarring, Dry/Scaly, Maceration, Atrophie Blanche, Cyanosis, Ecchymosis, Hemosiderin Staining, Mottled, Joanne Michael, Joanne O. ([id number]) Pallor, Rubor, Erythema. Assessment Active Problems ICD-10 Lymphedema, not elsewhere classified Non-pressure chronic ulcer of other part of left lower leg with fat layer exposed Disorder of the skin and subcutaneous tissue, unspecified Paroxysmal atrial fibrillation Procedures Wound #2 Pre-procedure diagnosis of Wound #2 is a Trauma, Other located on the Left,Posterior Lower Leg . There was a Excisional Skin/Subcutaneous Tissue Debridement with a total area of 1.2 sq cm performed by STONE III, Toriann Spadoni E., PA-C. With the following  instrument(s): Curette to remove Viable tissue/material. Material removed includes Eschar, Subcutaneous Tissue, and Slough after achieving pain control using Lidocaine. No specimens were taken. A time out was conducted at 14:05, prior to the start of the procedure. There was no bleeding. The procedure was tolerated well with a pain level of 0 throughout and a pain level of 0 following the procedure. Post Debridement Measurements: 0.4cm length x 3cm width x 0.1cm depth; 0.094cm^3 volume. Character of Wound/Ulcer Post Debridement is improved. Post procedure Diagnosis Wound #2: Same as Pre-Procedure There was a biopsy performed of the non-wound chest wall lesion by STONE III, Madison Albea E., PA-C. The skin was cleansed and prepped with anti-septic followed by pain control using Lidocaine Injectable: 2%. Tissue was removed at its base with the following instrument(s): Blade and Forceps and sent to pathology. A time out was conducted prior to the start of the procedure. The procedure was tolerated well with a pain level of 0 throughout and a pain level of 0 following the procedure. Patient s Level of Consciousness post procedure was recorded as Awake and Alert. Post procedure Diagnosis Wound #: Same as Pre-Procedure Plan Wound Cleansing: Wound #2 Left,Posterior Lower Leg: May Shower, gently pat wound dry prior to applying new dressing. Primary Wound Dressing: Wound #2 Left,Posterior Lower Leg: Silver Collagen Wound #3 Midline Chest: Silver Collagen Secondary Dressing: Wound #2 Left,Posterior Lower Leg: Joanne Carmel  Jenetta Michael ([id number]) Conform/Kerlix - to leg Non-adherent pad Other Wound #3 Midline Chest: Conform/Kerlix - to leg Non-adherent pad Other Dressing Change Frequency: Wound #2 Left,Posterior Lower Leg: Change dressing every other day. Follow-up Appointments: Return Appointment in 1 week. My suggestion currently is going to be that we initiate the above wound care measures for the next  week. The patient is in agreement with the plan. Subsequently we're gonna see how things stand at follow-up. We will make appropriate recommendations for further evaluation and treatment of the chest lesion depending on the results of the pathology report. Please see above for specific wound care orders. We will see patient for re-evaluation in 1 week(s) here in the clinic. If anything worsens or changes patient will contact our office for additional recommendations. Electronic Signature(s) Signed: 03/16/2018 1:38:17 AM By: Worthy Keeler PA-C Previous Signature: 03/06/2018 10:02:40 AM Version By: Worthy Keeler PA-C Entered By: Worthy Keeler on 03/14/2018 08:48:49 Zyah Gomm Michael ([id number]) -------------------------------------------------------------------------------- ROS/PFSH Details Patient Name: Donie Moulton Michael Date of Service: 02/28/2018 12:30 PM Medical Record Number: [id number] Patient Account Number: [id number] Date of Birth/Sex: 1924-08-17 (85 y.o. F) Treating RN: Secundino Ginger Primary Care Provider: Dion Body Other Clinician: Referring Provider: Dion Body Treating Provider/Extender: Melburn Hake, Cosme Jacob Weeks in Treatment: 0 Information Obtained From Patient Wound History Do you currently have one or more open woundso No Have you tested positive for osteomyelitis (bone infection)o No Have you had any tests for circulation on your legso No Eyes Complaints and Symptoms: Positive for: Glasses / Contacts Negative for: Dry Eyes; Vision Changes Medical History: Negative for: Cataracts; Glaucoma; Optic Neuritis Ear/Nose/Mouth/Throat Complaints and Symptoms: Negative for: Difficult clearing ears; Sinusitis Medical History: Negative for: Chronic sinus problems/congestion; Middle ear problems Hematologic/Lymphatic Complaints and Symptoms: Negative for: Bleeding / Clotting Disorders; Human Immunodeficiency Virus Medical History: Negative for: Anemia;  Hemophilia; Human Immunodeficiency Virus; Lymphedema; Sickle Cell Disease Respiratory Complaints and Symptoms: Positive for: Shortness of Breath Negative for: Chronic or frequent coughs Medical History: Negative for: Aspiration; Asthma; Chronic Obstructive Pulmonary Disease (COPD); Pneumothorax; Sleep Apnea; Tuberculosis Past Medical History Notes: mycobacterial pulmonary disease Cardiovascular Complaints and Symptoms: Positive for: LE edema - bilat Negative for: Chest pain Joanne Michael, PERFETTI ([id number]) Medical History: Positive for: Arrhythmia - a fib; Hypertension Negative for: Angina; Congestive Heart Failure; Coronary Artery Disease; Deep Vein Thrombosis; Hypotension; Myocardial Infarction; Peripheral Arterial Disease; Peripheral Venous Disease; Phlebitis; Vasculitis Past Medical History Notes: varicose veins Gastrointestinal Complaints and Symptoms: Negative for: Frequent diarrhea; Nausea; Vomiting Medical History: Negative for: Cirrhosis ; Colitis; Crohnos; Hepatitis A; Hepatitis B; Hepatitis C Endocrine Complaints and Symptoms: Negative for: Hepatitis; Thyroid disease; Polydypsia (Excessive Thirst) Medical History: Negative for: Type I Diabetes; Type II Diabetes Immunological Complaints and Symptoms: Negative for: Hives; Itching Medical History: Negative for: Lupus Erythematosus; Raynaudos; Scleroderma Integumentary (Skin) Complaints and Symptoms: Positive for: Bleeding or bruising tendency; Breakdown; Swelling Medical History: Negative for: History of Burn; History of pressure wounds Musculoskeletal Complaints and Symptoms: Negative for: Muscle Pain; Muscle Weakness Medical History: Positive for: Gout; Osteoarthritis Negative for: Rheumatoid Arthritis; Osteomyelitis Neurologic Complaints and Symptoms: Negative for: Numbness/parasthesias; Focal/Weakness Medical History: Negative for: Dementia; Neuropathy; Quadriplegia; Paraplegia; Seizure  Disorder Psychiatric Complaints and Symptoms: Negative for: Anxiety Joanne, Michael ([id number]) Medical History: Negative for: Anorexia/bulimia; Confinement Anxiety Oncologic Medical History: Negative for: Received Chemotherapy; Received Radiation Immunizations Pneumococcal Vaccine: Received Pneumococcal Vaccination: Yes Immunization Notes: up to date Implantable Devices Family and Social History Cancer: Yes - Siblings,Mother; Hypertension: No; Kidney Disease: No; Lung  Disease: Yes - Siblings; Stroke: Yes - Siblings; Thyroid Problems: No; Tuberculosis: No; Never smoker; Marital Status - Widowed; Alcohol Use: Never; Drug Use: No History; Caffeine Use: Daily; Financial Concerns: No; Food, Clothing or Shelter Needs: No; Support System Lacking: No; Transportation Concerns: No; Advanced Directives: No; Patient does not want information on Advanced Directives; Do not resuscitate: No; Living Will: Yes (Not Provided); Medical Power of Attorney: Yes - daughter Pablo Ledger (Not Provided) Electronic Signature(s) Signed: 02/28/2018 2:48:30 PM By: Secundino Ginger Signed: 03/06/2018 10:02:40 AM By: Worthy Keeler PA-C Entered By: Secundino Ginger on 02/28/2018 13:13:40 Rook Maue Michael ([id number]) -------------------------------------------------------------------------------- [place] Details Patient Name: Daron Breeding Michael Date of Service: 02/28/2018 Medical Record Number: [id number] Patient Account Number: [id number] Date of Birth/Sex: Dec 12, 1924 (85 y.o. F) Treating RN: Harold Barban Primary Care Provider: Dion Body Other Clinician: Referring Provider: Dion Body Treating Provider/Extender: Melburn Hake, Jesly Hartmann Weeks in Treatment: 0 Diagnosis Coding ICD-10 Codes Code Description I89.0 Lymphedema, not elsewhere classified L97.822 Non-pressure chronic ulcer of other part of left lower leg with fat layer exposed L98.9 Disorder of the skin and subcutaneous tissue, unspecified I48.0  Paroxysmal atrial fibrillation Facility Procedures CPT4 Code Description: [id number] - WOUND CARE VISIT-LEV 3 EST PT Modifier: Quantity: 1 CPT4 Code Description: [id number] [place] - DEB SUBQ TISSUE 20 SQ CM/< ICD-10 Diagnosis Description L97.822 Non-pressure chronic ulcer of other part of left lower leg with fat Modifier: layer expose Quantity: 1 d CPT4 Code Description: [id number] 11102-Tangential biopsy of skin (e.g., shave, scoop, saucerize, curette) single lesion ICD-10 Diagnosis Description L98.9 Disorder of the skin and subcutaneous tissue, unspecified Modifier: Quantity: 1 Physician Procedures CPT4 Code Description: [id number] 26203 - WC PHYS LEVEL 4 - EST PT ICD-10 Diagnosis Description I89.0 Lymphedema, not elsewhere classified L97.822 Non-pressure chronic ulcer of other part of left lower leg with fat L98.9 Disorder of the skin and  subcutaneous tissue, unspecified I48.0 Paroxysmal atrial fibrillation Modifier: 25 layer expos Quantity: 1 ed CPT4 Code Description: [id number] 11042 - WC PHYS SUBQ TISS 20 SQ CM ICD-10 Diagnosis Description L97.822 Non-pressure chronic ulcer of other part of left lower leg with fat Modifier: layer expos Quantity: 1 ed CPT4 Code Description: 11102 Tangential biopsy of skin (e.g., shave, scoop, saucerize, curette) single lesion ICD-10 Diagnosis Description L98.9 Disorder of the skin and subcutaneous tissue, unspecified Modifier: Quantity: 1 Electronic Signature(s) BREAUNNA, GOTTLIEB ([id number]) Signed: 03/06/2018 10:02:40 AM By: Worthy Keeler PA-C Entered By: Worthy Keeler on 02/28/2018 14:39:17

## 2018-03-07 NOTE — Progress Notes (Signed)
BEE, MARCHIANO ([id number]) Visit Report for 02/28/2018 Allergy List Details Patient Name: Joanne Michael, Joanne Michael Date of Service: 02/28/2018 12:30 PM Medical Record Number: [id number] Patient Account Number: [id number] Date of Birth/Sex: 1924/05/11 (85 y.o. F) Treating RN: Secundino Ginger Primary Care Laquida Cotrell: Dion Body Other Clinician: Referring Amal Saiki: Dion Body Treating Troi Bechtold/Extender: Melburn Hake, Joanne Weeks in Treatment: 0 Allergies Active Allergies silicone, adhesive oxycodone HCl amlodipine amoxicillin codeine Darvocet-N Demerol iodine meperidine Vicodin Allergy Notes Electronic Signature(s) Signed: 02/28/2018 2:48:30 PM By: Secundino Ginger Entered By: Secundino Ginger on 02/28/2018 13:07:21 Joanne Michael ([id number]) -------------------------------------------------------------------------------- [place] Information Details Patient Name: Joanne Michael Date of Service: 02/28/2018 12:30 PM Medical Record Number: [id number] Patient Account Number: [id number] Date of Birth/Sex: 07-31-1924 (85 y.o. F) Treating RN: Secundino Ginger Primary Care Tyrece Vanterpool: Dion Body Other Clinician: Referring Dot Splinter: Dion Body Treating Rudolf Blizard/Extender: Melburn Hake, Joanne Weeks in Treatment: 0 Visit Information Patient Arrived: Ambulatory Arrival Time: 12:37 Accompanied By: son in law Transfer Assistance: None Patient Identification Verified: Yes Secondary Verification Process Completed: Yes History Since Last Visit Added or deleted any medications: No Any new allergies or adverse reactions: No Had a fall or experienced change in activities of daily living that may affect risk of falls: No Signs or symptoms of abuse/neglect since last visito No Hospitalized since last visit: No Implantable device outside of the clinic excluding cellular tissue based products placed in the center since last visit: No Has Dressing in Place as Prescribed: No Pain Present Now:  Yes Electronic Signature(s) Signed: 02/28/2018 2:48:30 PM By: Secundino Ginger Entered By: Secundino Ginger on 02/28/2018 12:59:01 Joanne Michael ([id number]) -------------------------------------------------------------------------------- Clinic Level of Care Assessment Details Patient Name: Joanne Michael Date of Service: 02/28/2018 12:30 PM Medical Record Number: [id number] Patient Account Number: [id number] Date of Birth/Sex: 1924-08-21 (85 y.o. F) Treating RN: Harold Barban Primary Care Aleathia Purdy: Dion Body Other Clinician: Referring Belen Pesch: Dion Body Treating Shardea Cwynar/Extender: Melburn Hake, Joanne Weeks in Treatment: 0 Clinic Level of Care Assessment Items TOOL 1 Quantity Score []  - Use when EandM and Procedure is performed on INITIAL visit 0 ASSESSMENTS - Nursing Assessment / Reassessment X - General Physical Exam (combine w/ comprehensive assessment (listed just below) when 1 20 performed on new pt. evals) X- 1 25 Comprehensive Assessment (HX, ROS, Risk Assessments, Wounds Hx, etc.) ASSESSMENTS - Wound and Skin Assessment / Reassessment X - Dermatologic / Skin Assessment (not related to wound area) 1 10 ASSESSMENTS - Ostomy and/or Continence Assessment and Care []  - Incontinence Assessment and Management 0 []  - 0 Ostomy Care Assessment and Management (repouching, etc.) PROCESS - Coordination of Care X - Simple Patient / Family Education for ongoing care 1 15 []  - 0 Complex (extensive) Patient / Family Education for ongoing care X- 1 10 Staff obtains Programmer, systems, Records, Test Results / Process Orders []  - 0 Staff telephones HHA, Nursing Homes / Clarify orders / etc []  - 0 Routine Transfer to another Facility (non-emergent condition) []  - 0 Routine Hospital Admission (non-emergent condition) []  - 0 New Admissions / Biomedical engineer / Ordering NPWT, Apligraf, etc. []  - 0 Emergency Hospital Admission (emergent condition) PROCESS - Special Needs []  -  Pediatric / Minor Patient Management 0 []  - 0 Isolation Patient Management []  - 0 Hearing / Language / Visual special needs []  - 0 Assessment of Community assistance (transportation, D/C planning, etc.) []  - 0 Additional assistance / Altered mentation []  - 0 Support Surface(s) Assessment (bed, cushion, seat, etc.) Joanne Michael, Joanne Michael. ([id number]) INTERVENTIONS - Miscellaneous []  -  External ear exam 0 []  - 0 Patient Transfer (multiple staff / Civil Service fast streamer / Similar devices) []  - 0 Simple Staple / Suture removal (25 or less) []  - 0 Complex Staple / Suture removal (26 or more) []  - 0 Hypo/Hyperglycemic Management (do not check if billed separately) X- 1 15 Ankle / Brachial Index (ABI) - do not check if billed separately Has the patient been seen at the hospital within the last three years: Yes Total Score: 95 Level Of Care: New/Established - Level 3 Electronic Signature(s) Signed: 03/06/2018 2:41:13 PM By: Harold Barban Entered By: Harold Barban on 02/28/2018 14:25:33 Joanne Michael ([id number]) -------------------------------------------------------------------------------- Encounter Discharge Information Details Patient Name: Joanne Michael Date of Service: 02/28/2018 12:30 PM Medical Record Number: [id number] Patient Account Number: [id number] Date of Birth/Sex: [birth year] (85 y.o. F) Treating RN: Harold Barban Primary Care Osceola Holian: Dion Body Other Clinician: Referring Irwin Toran: Dion Body Treating Tong Pieczynski/Extender: Melburn Hake, Joanne Weeks in Treatment: 0 Encounter Discharge Information Items Post Procedure Vitals Discharge Condition: Stable Temperature (F): 97.9 Ambulatory Status: Ambulatory Pulse (bpm): 55 Discharge Destination: Home Respiratory Rate (breaths/min): 16 Transportation: Private Auto Blood Pressure (mmHg): 155/40 Accompanied By: son Schedule Follow-up Appointment: Yes Clinical Summary of Care: Electronic Signature(s) Signed:  03/06/2018 2:41:13 PM By: Harold Barban Entered By: Harold Barban on 02/28/2018 14:47:11 Joanne Michael ([id number]) -------------------------------------------------------------------------------- Lower Extremity Assessment Details Patient Name: Joanne Michael Date of Service: 02/28/2018 12:30 PM Medical Record Number: [id number] Patient Account Number: [id number] Date of Birth/Sex: 1924-04-12 (85 y.o. F) Treating RN: Secundino Ginger Primary Care Lilla Callejo: Dion Body Other Clinician: Referring Ileah Falkenstein: Dion Body Treating Janiyla Long/Extender: Melburn Hake, Joanne Weeks in Treatment: 0 Edema Assessment Assessed: [Left: No] [Right: No] Edema: [Left: No] [Right: No] Calf Left: Right: Point of Measurement: 27 cm From Medial Instep 30 cm 29 cm Ankle Left: Right: Point of Measurement: 10 cm From Medial Instep 21 cm 20 cm Vascular Assessment Claudication: Claudication Assessment [Left:None] [Right:None] Pulses: Dorsalis Pedis Palpable: [Left:Yes] [Right:Yes] Posterior Tibial Extremity colors, hair growth, and conditions: Extremity Color: [Left:Hyperpigmented] [Right:Hyperpigmented] Capillary Refill: [Left:< 3 seconds] [Right:< 3 seconds] Blood Pressure: Brachial: [Left:140] Dorsalis Pedis: 170 [Left:Dorsalis Pedis:] Ankle: Posterior Tibial: 160 [Left:Posterior Tibial: 1.21] Toe Nail Assessment Left: Right: Thick: Yes Yes Discolored: No No Deformed: No No Improper Length and Hygiene: No No Notes rt. non-compressible. Electronic Signature(s) Signed: 02/28/2018 2:48:30 PM By: Secundino Ginger Entered By: Secundino Ginger on 02/28/2018 13:35:38 Joanne Michael ([id number]) -------------------------------------------------------------------------------- Multi Wound Chart Details Patient Name: Joanne Michael Date of Service: 02/28/2018 12:30 PM Medical Record Number: [id number] Patient Account Number: [id number] Date of Birth/Sex: September 13, 1924 (85 y.o. F) Treating RN: Harold Barban Primary Care Lajuan Kovaleski: Dion Body Other Clinician: Referring Rome Schlauch: Dion Body Treating Loveta Dellis/Extender: Melburn Hake, Joanne Weeks in Treatment: 0 Vital Signs Height(in): 62 Pulse(bpm): 20 Weight(lbs): 115 Blood Pressure(mmHg): 155/40 Body Mass Index(BMI): 21 Temperature(F): 97.9 Respiratory Rate 20 (breaths/min): Photos: [2:No Photos] [N/A:N/A] Wound Location: [2:Left Lower Leg - Posterior] [N/A:N/A] Wounding Event: [2:Trauma] [N/A:N/A] Primary Etiology: [2:Trauma, Other] [N/A:N/A] Date Acquired: [2:12/15/2017] [N/A:N/A] Weeks of Treatment: [2:0] [N/A:N/A] Wound Status: [2:Open] [N/A:N/A] Measurements L x W x D [2:0.4x3x0.1] [N/A:N/A] (cm) Area (cm) : [2:0.942] [N/A:N/A] Volume (cm) : [2:0.094] [N/A:N/A] Classification: [2:Full Thickness Without Exposed Support Structures] [N/A:N/A] Exudate Amount: [2:None Present] [N/A:N/A] Granulation Amount: [2:None Present (0%)] [N/A:N/A] Necrotic Amount: [2:Large (67-100%)] [N/A:N/A] Necrotic Tissue: [2:Eschar] [N/A:N/A] Exposed Structures: [2:Fascia: No Fat Layer (Subcutaneous Tissue) Exposed: No Tendon: No Muscle: No Joint: No Bone: No] [N/A:N/A] Periwound Skin Texture: [2:Excoriation: No  Induration: No Callus: No Crepitus: No Rash: No Scarring: No] [N/A:N/A] Periwound Skin Moisture: [2:Maceration: No Dry/Scaly: No] [N/A:N/A] Periwound Skin Color: [2:Atrophie Blanche: No Cyanosis: No Ecchymosis: No Erythema: No Hemosiderin Staining: No] [N/A:N/A] Mottled: No Pallor: No Rubor: No Tenderness on Palpation: No N/A N/A Wound Preparation: Ulcer Cleansing: N/A N/A Rinsed/Irrigated with Saline Topical Anesthetic Applied: Other: lidocaine 4% Treatment Notes Electronic Signature(s) Signed: 03/06/2018 2:41:13 PM By: Harold Barban Entered By: Harold Barban on 02/28/2018 13:26:12 Joanne Michael  ([id number]) -------------------------------------------------------------------------------- [place] Details Patient Name: Joanne Michael Date of Service: 02/28/2018 12:30 PM Medical Record Number: [id number] Patient Account Number: [id number] Date of Birth/Sex: [birth year] (85 y.o. F) Treating RN: Harold Barban Primary Care Leta Bucklin: Dion Body Other Clinician: Referring Bellamie Turney: Dion Body Treating Brylen Wagar/Extender: Melburn Hake, Joanne Weeks in Treatment: 0 Active Inactive Wound/Skin Impairment Nursing Diagnoses: Impaired tissue integrity Goals: Ulcer/skin breakdown will have a volume reduction of 30% by week 4 Date Initiated: 02/28/2018 Target Resolution Date: 03/31/2018 Goal Status: Active Interventions: Assess patient/caregiver ability to obtain necessary supplies Assess patient/caregiver ability to perform ulcer/skin care regimen upon admission and as needed Assess ulceration(s) every visit Notes: Electronic Signature(s) Signed: 03/06/2018 2:41:13 PM By: Harold Barban Entered By: Harold Barban on 02/28/2018 13:26:01 Joanne Michael ([id number]) -------------------------------------------------------------------------------- Pain Assessment Details Patient Name: Joanne Michael Date of Service: 02/28/2018 12:30 PM Medical Record Number: [id number] Patient Account Number: [id number] Date of Birth/Sex: 05-29-1924 (85 y.o. F) Treating RN: Secundino Ginger Primary Care Uchechukwu Dhawan: Dion Body Other Clinician: Referring Terrea Bruster: Dion Body Treating Danni Shima/Extender: Melburn Hake, Joanne Weeks in Treatment: 0 Active Problems Location of Pain Severity and Description of Pain Patient Has Paino Yes Site Locations Pain Location: Pain in Ulcers Rate the pain. Current Pain Level: 8 Pain Management and Medication Current Pain Management: Notes mid line upper chest. Electronic Signature(s) Signed: 02/28/2018 2:48:30 PM By: Secundino Ginger Entered By: Secundino Ginger on 02/28/2018 12:59:17 Joanne Michael ([id number]) -------------------------------------------------------------------------------- Patient/Caregiver Education Details Patient Name: Joanne Michael Date of Service: 02/28/2018 12:30 PM Medical Record Number: [id number] Patient Account Number: [id number] Date of Birth/Gender: 08-11-1924 (85 y.o. F) Treating RN: Harold Barban Primary Care Physician: Dion Body Other Clinician: Referring Physician: Dion Body Treating Physician/Extender: Sharalyn Ink in Treatment: 0 Education Assessment Education Provided To: Patient Education Topics Provided Welcome To [facility] [place] Handouts: Welcome To [facility] [place] Methods: Demonstration, Explain/Verbal Responses: State content correctly Electronic Signature(s) Signed: 03/06/2018 2:41:13 PM By: Harold Barban Entered By: Harold Barban on 02/28/2018 13:26:27 Joanne Michael ([id number]) -------------------------------------------------------------------------------- Wound Assessment Details Patient Name: Joanne Michael Date of Service: 02/28/2018 12:30 PM Medical Record Number: [id number] Patient Account Number: [id number] Date of Birth/Sex: 1925/01/22 (85 y.o. F) Treating RN: Secundino Ginger Primary Care Aaliyha Mumford: Dion Body Other Clinician: Referring Ileana Chalupa: Dion Body Treating Asiah Befort/Extender: Melburn Hake, Joanne Weeks in Treatment: 0 Wound Status Wound Number: 2 Primary Etiology: Trauma, Other Wound Location: Left Lower Leg - Posterior Wound Status: Open Wounding Event: Trauma Date Acquired: 12/15/2017 Weeks Of Treatment: 0 Clustered Wound: No Photos Photo Uploaded By: Secundino Ginger on 02/28/2018 14:46:27 Wound Measurements Length: (cm) 0.4 Width: (cm) 3 Depth: (cm) 0.1 Area: (cm) 0.942 Volume: (cm) 0.094 % Reduction in Area: % Reduction in Volume: Tunneling: No Undermining: No Wound  Description Full Thickness Without Exposed Support Classification: Structures Exudate None Present Amount: Wound Bed Granulation Amount: None Present (0%) Exposed Structure Necrotic Amount: Large (67-100%) Fascia Exposed: No Necrotic Quality: Eschar Fat Layer (Subcutaneous Tissue) Exposed: No Tendon  Exposed: No Muscle Exposed: No Joint Exposed: No Bone Exposed: No Periwound Skin Texture Texture Color No Abnormalities Noted: No No Abnormalities Noted: No Joanne Michael, Joanne Michael. ([id number]) Callus: No Atrophie Blanche: No Crepitus: No Cyanosis: No Excoriation: No Ecchymosis: No Induration: No Erythema: No Rash: No Hemosiderin Staining: No Scarring: No Mottled: No Pallor: No Moisture Rubor: No No Abnormalities Noted: No Dry / Scaly: No Maceration: No Wound Preparation Ulcer Cleansing: Rinsed/Irrigated with Saline Topical Anesthetic Applied: Other: lidocaine 4%, Electronic Signature(s) Signed: 02/28/2018 2:48:30 PM By: Secundino Ginger Entered By: Secundino Ginger on 02/28/2018 13:05:11 Joanne Michael ([id number]) -------------------------------------------------------------------------------- Wound Assessment Details Patient Name: Joanne Michael Date of Service: 02/28/2018 12:30 PM Medical Record Number: [id number] Patient Account Number: [id number] Date of Birth/Sex: [birth year] (85 y.o. F) Treating RN: Harold Barban Primary Care Rita Prom: Dion Body Other Clinician: Referring Bria Portales: Dion Body Treating Kymber Kosar/Extender: Melburn Hake, Joanne Weeks in Treatment: 0 Wound Status Wound Number: 3 Primary Etiology: Open Surgical Wound Wound Location: Chest - Midline Wound Status: Open Wounding Event: Surgical Injury Comorbid Arrhythmia, Hypertension, Gout, History: Osteoarthritis Date Acquired: 02/28/2018 Weeks Of Treatment: 0 Clustered Wound: No Photos Photo Uploaded By: Secundino Ginger on 02/28/2018 14:46:27 Wound Measurements Length: (cm) 0.9 %  Reduction Width: (cm) 1 % Reduction Depth: (cm) 0.2 Epitheliali Area: (cm) 0.707 Tunneling: Volume: (cm) 0.141 Underminin in Area: 0% in Volume: 0% zation: None No g: No Wound Description Classification: Partial Thickness Foul Odor A Wound Margin: Flat and Intact Slough/Fibr Exudate Amount: Medium Exudate Type: Serosanguineous Exudate Color: red, brown fter Cleansing: No ino No Wound Bed Granulation Amount: None Present (0%) Exposed Structure Necrotic Amount: None Present (0%) Fascia Exposed: No Fat Layer (Subcutaneous Tissue) Exposed: Yes Tendon Exposed: No Muscle Exposed: No Joint Exposed: No Bone Exposed: No Periwound Skin Texture Joanne Michael, Joanne Michael. ([id number]) Texture Color No Abnormalities Noted: No No Abnormalities Noted: No Callus: No Atrophie Blanche: No Crepitus: No Cyanosis: No Excoriation: No Ecchymosis: No Induration: No Erythema: No Rash: No Hemosiderin Staining: No Scarring: No Mottled: No Pallor: No Moisture Rubor: No No Abnormalities Noted: No Dry / Scaly: No Maceration: No Wound Preparation Ulcer Cleansing: Rinsed/Irrigated with Saline Topical Anesthetic Applied: None Electronic Signature(s) Signed: 03/06/2018 2:41:13 PM By: Harold Barban Entered By: Harold Barban on 02/28/2018 14:29:51 Joanne Michael ([id number]) -------------------------------------------------------------------------------- Vitals Details Patient Name: Joanne Michael Date of Service: 02/28/2018 12:30 PM Medical Record Number: [id number] Patient Account Number: [id number] Date of Birth/Sex: [birth year] (85 y.o. F) Treating RN: Secundino Ginger Primary Care Lauramae Kneisley: Dion Body Other Clinician: Referring Tishie Altmann: Dion Body Treating Melannie Metzner/Extender: Melburn Hake, Joanne Weeks in Treatment: 0 Vital Signs Time Taken: 12:30 Temperature (F): 97.9 Height (in): 62 Pulse (bpm): 55 Source: Stated Respiratory Rate (breaths/min): 20 Weight (lbs):  115 Blood Pressure (mmHg): 155/40 Source: Stated Reference Range: 80 - 120 mg / dl Body Mass Index (BMI): 21 Electronic Signature(s) Signed: 02/28/2018 2:48:30 PM By: Secundino Ginger Entered BySecundino Ginger on 02/28/2018 12:56:34

## 2018-03-10 NOTE — Progress Notes (Signed)
Cardiology Office Note  Date:  03/11/2018   ID:  Joanne Michael, DOB 1924/08/18, MRN [id number]  PCP:  Dion Body, MD   Chief Complaint  Patient presents with  . Other    ED follow up for chest pain 03/03/2018. Patietn c/o SOB and chest pain. Meds reviewed verbally with patient.     HPI:  Joanne Michael is a pleasant 85 yo woman with  mild aortic valve stenosis in 2011,  GERD,  hyperlipidemia,  hypertension  Previous episode  of near syncope Mild carotid disease Less than 39% b/l disease 11/2015 who presented for lower extremity edema that started in December 2014,   improved symptoms after amlodipine was held She presents today for follow-up of her blood pressure,Leg bruising  Seen in the emergency room March 03, 2018 for chest pain Hospital records reviewed with the patient in detail Dx with musculoskeletal pain Reported having heart rate pounding while in the emergency room but notes detailing heart rate in the 40s Blood pressure was markedly elevated at that time now down to normal levels  Numerous musculoskeleatl pains Ribs, back, chest, lower back, heels  Previously seen in the pain clinic Not recently  Does not drive anymore, family driving for her but not in the room with her today Little bit confused on today's visit, perseverating about several items such as the biopsy on her chest  She does report rare episodes of tachycardia, goal to determine how often these episodes are  Previous echocardiogram reviewed with her  07/31/16 which showed an EF of 65-70% along with mild/moderate AS/AR and mild MR.  EKG personally reviewed by myself on todays visit Sinus bradycardia rate 45 bpm no significant ST or T wave changes  Other past medical history admission 6/26 to 08/30/2016  for chest pain elevated troponin chronic kidney disease new onset atrial fibrillation,  Felt to have noncardiac chest pain VQ scan low probability PE Possible left lower lobe pneumonia  treated with antibiotics Creatinine 2.28 at peak Plavix discontinued by Dr. Lucky Cowboy On anticoagulation for atrial fibrillation. She converted to normal sinus in the hospital   echocardiogram with normal ejection fraction Mild to moderate aortic valve stenosis   Takes miralex, prunes For constipation Continues to have coughing, relying on her albuterol inhaler Previously seen by pulmonary 2 years ago, has not had follow-up Still feels congested. Prior history of pneumonias March 2018  Previously seen in the ER seen in the emergency room 05/03/2016 for dysuria, fecal impaction Started on Miramax Seen in the emergency room 04/16/2016 contusions to the face Seen in the emergency room 01/17/2016 urinary retention fecal impaction  Other past medical history reviewed Previous Large hematoma,Left lower extremity  Previous hospital for Hypertensive urgency due to severe Renal artery stenosis right  Renal angioplasty, 3 weeks ago, Dr. dew On asa and plavix   hospital for PNA ,  05/09/2016 RequiredABX,  Low sodium On arrival,Received normal saline, sodium 124 up to 137 Potassium 3.1 Had diarrhea when she got home  Previous leg edema resolved by holding amlodipine Denies having any further episodes of near syncope or syncope  08/08/2014 she was at church when she developed syncope/near syncope. She was in a sitting position. Reports indicate low blood pressure. She was taken to the emergency room, lab work showed elevated BUN and creatinine consistent with dehydration. She was given fluids with improvement of her symptoms. She denied any recent illnesses. Several days prior she had radiofrequency ablation of her back.   Lab work from primary  care shows creatinine 2.0, GFR in the 20s she was previously on lisinopril and HCTZ, pill. This was held and she was changed to amlodipine Laboratory from 07/08/2011 shows creatinine 2.1, GFR 22, BUN 21, potassium 3.3  Prior echocardiogram in 2011  showing normal LV systolic function, diastolic relaxation abnormality, mild aortic valve stenosis with no significant gradient, mild MR, high normal right ventricular systolic pressures  PMH:   has a past medical history of Arrhythmia, Arthritis, Cancer ([place]), Cat allergies, CHF (congestive heart failure) ([place]), Chronic renal failure, Fatigue, GERD (gastroesophageal reflux disease), Gout, H/O Clostridium difficile infection, Hay fever, Hiatal hernia, HLD (hyperlipidemia), HTN (hypertension), Iron deficiency anemia, and Spinal stenosis.  PSH:    Past Surgical History:  Procedure Laterality Date  . APPENDECTOMY    . BACK SURGERY    . CATARACT EXTRACTION    . CHOLECYSTECTOMY    . feet surgery    . HERNIA REPAIR    . knee replacement- both    . MELANOMA EXCISION    . NOSE SURGERY    . RENAL ANGIOGRAPHY N/A 05/07/2016   Procedure: Renal Angiography;  Surgeon: Algernon Huxley, MD;  Location: [facility];  Service: Cardiovascular;  Laterality: N/A;  . ROTATOR CUFF REPAIR     left  . SHOULDER SURGERY    . TONSILLECTOMY    . TOTAL ABDOMINAL HYSTERECTOMY      Current Outpatient Medications  Medication Sig Dispense Refill  . albuterol (PROVENTIL HFA;VENTOLIN HFA) 108 (90 BASE) MCG/ACT inhaler Inhale 1-2 puffs into the lungs every 6 (six) hours as needed for wheezing or shortness of breath. 1 Inhaler 6  . allopurinol (ZYLOPRIM) 100 MG tablet Take 100 mg by mouth 2 (two) times daily.    Marland Kitchen amiodarone (PACERONE) 200 MG tablet TAKE 1 TABLET BY MOUTH DAILY 30 tablet 5  . Ascorbic Acid (VITAMIN C) 1000 MG tablet Take 1,000 mg by mouth daily.    Marland Kitchen atorvastatin (LIPITOR) 20 MG tablet Take 20 mg by mouth at bedtime.     . Calcium Carbonate-Vitamin D (CALTRATE 600+D) 600-400 MG-UNIT per tablet Take 1 tablet by mouth daily.      . cetirizine (ZYRTEC) 10 MG chewable tablet Chew 10 mg by mouth daily.      . cloNIDine (CATAPRES) 0.1 MG tablet Take 1 tablet (0.1 mg total) by mouth 2 (two) times daily. 60  tablet 11  . collagenase (SANTYL) ointment Apply 1 application topically daily.    Marland Kitchen ELIQUIS 2.5 MG TABS tablet TAKE ONE TABLET BY MOUTH TWICE DAILY 60 tablet 6  . ferrous sulfate 325 (65 FE) MG tablet Take 325 mg by mouth daily with breakfast.    . fluticasone (FLONASE) 50 MCG/ACT nasal spray Place 2 sprays into both nostrils at bedtime as needed for allergies or rhinitis. 16 g 0  . furosemide (LASIX) 20 MG tablet TAKE ONE TABLET DAILY AS NEEDED 30 tablet 2  . guaiFENesin (MUCINEX) 600 MG 12 hr tablet Take by mouth 2 (two) times daily.    . hydrALAZINE (APRESOLINE) 25 MG tablet Take 3 tablets (75 mg total) by mouth every 8 (eight) hours. 60 tablet 0  . isosorbide mononitrate (IMDUR) 30 MG 24 hr tablet Take 1 tablet (30 mg total) by mouth daily. 30 tablet 0  . Multiple Vitamin (MULTIVITAMIN) tablet Take 1 tablet by mouth daily.    . Multiple Vitamins-Minerals (PRESERVISION AREDS 2 PO) Take 1 capsule by mouth daily.    . pantoprazole (PROTONIX) 40 MG tablet Take 40  mg by mouth at bedtime.  30 tablet 11  . Probiotic Product (PHILLIPS COLON HEALTH PO) Take 1 capsule by mouth daily.     No current facility-administered medications for this visit.      Allergies:   Oxycodone; Codeine; Hydrocodone-acetaminophen; Iodine; Meperidine hcl; Other; and Propoxyphene n-acetaminophen   Social History:  The patient  reports that she has never smoked. She has never used smokeless tobacco. She reports current alcohol use of about 3.0 standard drinks of alcohol per week. She reports that she does not use drugs.   Family History:   family history includes Heart failure in her father; Liver cancer in her father; Lung cancer in her brother and sister; Ovarian cancer in her mother; Prostate cancer in her brother.    Review of Systems: Review of Systems  Constitutional: Negative.   HENT: Negative.   Respiratory: Negative.   Cardiovascular: Negative.   Gastrointestinal: Negative.   Musculoskeletal: Positive  for back pain, joint pain and neck pain.  Neurological: Negative.   Psychiatric/Behavioral: Negative.   All other systems reviewed and are negative.    PHYSICAL EXAM: VS:  BP (!) 130/50 (BP Location: Left Arm, Patient Position: Sitting, Cuff Size: Normal)   Pulse (!) 45   Ht 5\' 2"  (1.575 m)   Wt 113 lb 8 oz (51.5 kg)   BMI 20.76 kg/m  , BMI Body mass index is 20.76 kg/m. Constitutional:  oriented to person, place, and time. No distress.  HENT:  Head: Grossly normal Eyes:  no discharge. No scleral icterus.  Neck: No JVD, no carotid bruits  Cardiovascular: Regular rate and rhythm, no murmurs appreciated Pulmonary/Chest: Clear to auscultation bilaterally, no wheezes or rails Abdominal: Soft.  no distension.  no tenderness.  Musculoskeletal: Normal range of motion Neurological:  normal muscle tone. Coordination normal. No atrophy Skin: Skin warm and dry Psychiatric: normal affect, pleasant   Recent Labs: 03/03/2018: BUN 61; Creatinine, Ser 2.54; Hemoglobin 10.9; Platelets 234; Potassium 4.3; Sodium 135    Lipid Panel Lab Results  Component Value Date   CHOL 118 05/06/2016   HDL 46 05/06/2016   LDLCALC 54 05/06/2016   TRIG 89 05/06/2016      Wt Readings from Last 3 Encounters:  03/11/18 113 lb 8 oz (51.5 kg)  03/03/18 113 lb (51.3 kg)  12/27/17 112 lb (50.8 kg)       ASSESSMENT AND PLAN:  Paroxysmal atrial fibrillation She does appreciate occasional palpitations but frequency unclear Challenging historian Bradycardia, given her age I am concerned about heart rate in the 35s We will stop the carvedilol continue amiodarone Clonidine can also lower heart rate  Mixed hyperlipidemia - At goal, managed by primary care  Essential hypertension, benign -  Recommend she stop carvedilol today and monitor blood pressure at home  Aortic valve stenosis, etiology of cardiac valve disease unspecified -  Mild to moderate aortic valve stenosis, in 2018 Consider repeat  echocardiogram on her next visit  Chronic renal impairment, stage 2 (mild) -  Followed by nephrology Stable  Renal artery stenosis ([place]) stent placement by Dr. dew  , on eliquis Stable  Chronic pain Recommend she arrange a meeting with Dr. Loistine Chance Neck pain leading to visit in the emergency room   Disposition:   F/U  One year   Total encounter time more than 25 minutes  Greater than 50% was spent in counseling and coordination of care with the patient    Orders Placed This Encounter  Procedures  . EKG 12-Lead  Signed, Esmond Plants, M.D., Ph.D. 03/11/2018  [place], [place]

## 2018-03-10 NOTE — Progress Notes (Signed)
Joanne Michael ([id number]) Visit Report for 03/07/2018 Arrival Information Details Patient Name: Joanne Michael, Joanne Michael Date of Service: 03/07/2018 9:15 AM Medical Record Number: [id number] Patient Account Number: [id number] Date of Birth/Sex: 01/14/1925 (85 y.o. F) Treating RN: Montey Hora Primary Care Sathvik Tiedt: Dion Body Other Clinician: Referring Paislynn Hegstrom: Dion Body Treating Waino Mounsey/Extender: Melburn Hake, Joanne Weeks in Treatment: 1 Visit Information History Since Last Visit Added or deleted any medications: No Patient Arrived: Ambulatory Any new allergies or adverse reactions: No Arrival Time: 09:18 Had a fall or experienced change in No Accompanied By: son activities of daily living that may affect Transfer Assistance: None risk of falls: Patient Identification Verified: Yes Signs or symptoms of abuse/neglect since last visito No Secondary Verification Process Completed: Yes Hospitalized since last visit: No Implantable device outside of the clinic excluding No cellular tissue based products placed in the center since last visit: Has Dressing in Place as Prescribed: Yes Pain Present Now: No Electronic Signature(s) Signed: 03/07/2018 3:31:07 PM By: Lorine Bears RCP, RRT, CHT Entered By: Lorine Bears on 03/07/2018 09:18:46 Joanne Michael ([id number]) -------------------------------------------------------------------------------- Encounter Discharge Information Details Patient Name: Joanne Michael Date of Service: 03/07/2018 9:15 AM Medical Record Number: [id number] Patient Account Number: [id number] Date of Birth/Sex: [birth year] (85 y.o. F) Treating RN: Montey Hora Primary Care Cris Talavera: Dion Body Other Clinician: Referring Tanette Chauca: Dion Body Treating Vladimir Lenhoff/Extender: Melburn Hake, Joanne Weeks in Treatment: 1 Encounter Discharge Information Items Post Procedure Vitals Discharge Condition:  Stable Temperature (F): 97.7 Ambulatory Status: Ambulatory Pulse (bpm): 46 Discharge Destination: Home Respiratory Rate (breaths/min): 16 Transportation: Private Auto Blood Pressure (mmHg): 140/38 Accompanied By: son Schedule Follow-up Appointment: Yes Clinical Summary of Care: Electronic Signature(s) Signed: 03/07/2018 3:29:00 PM By: Montey Hora Entered By: Montey Hora on 03/07/2018 10:28:56 Joanne Michael ([id number]) -------------------------------------------------------------------------------- Lower Extremity Assessment Details Patient Name: Joanne Michael Date of Service: 03/07/2018 9:15 AM Medical Record Number: [id number] Patient Account Number: [id number] Date of Birth/Sex: 1924-12-09 (85 y.o. F) Treating RN: Harold Barban Primary Care Kiyo Heal: Dion Body Other Clinician: Referring Evangelyn Crouse: Dion Body Treating Nirvan Laban/Extender: Melburn Hake, Joanne Weeks in Treatment: 1 Edema Assessment Assessed: [Left: No] [Right: No] [Left: Edema] [Right: :] Calf Left: Right: Point of Measurement: 27 cm From Medial Instep 33.3 cm cm Ankle Left: Right: Point of Measurement: 10 cm From Medial Instep 21 cm cm Vascular Assessment Pulses: Dorsalis Pedis Palpable: [Left:Yes] Posterior Tibial Palpable: [Left:Yes] Extremity colors, hair growth, and conditions: Hair Growth on Extremity: [Left:No] Temperature of Extremity: [Left:Warm] Capillary Refill: [Left:< 3 seconds] Toe Nail Assessment Left: Right: Thick: No Discolored: No Deformed: No Improper Length and Hygiene: No Electronic Signature(s) Signed: 03/10/2018 3:50:02 PM By: Harold Barban Entered By: Harold Barban on 03/07/2018 09:39:34 Joanne Michael ([id number]) -------------------------------------------------------------------------------- Multi Wound Chart Details Patient Name: Joanne Michael Date of Service: 03/07/2018 9:15 AM Medical Record Number: [id number] Patient Account  Number: [id number] Date of Birth/Sex: 06-Mar-1924 (85 y.o. F) Treating RN: Montey Hora Primary Care Laelia Angelo: Dion Body Other Clinician: Referring Secundino Ellithorpe: Dion Body Treating Chaze Hruska/Extender: Melburn Hake, Joanne Weeks in Treatment: 1 Vital Signs Height(in): 54 Pulse(bpm): 14 Weight(lbs): 115 Blood Pressure(mmHg): 140/35 Body Mass Index(BMI): 21 Temperature(F): 97.7 Respiratory Rate 18 (breaths/min): Photos: [2:No Photos] [3:No Photos] [N/A:N/A] Wound Location: [2:Left Lower Leg - Posterior] [3:Chest - Midline] [N/A:N/A] Wounding Event: [2:Trauma] [3:Surgical Injury] [N/A:N/A] Primary Etiology: [2:Trauma, Other] [3:Open Surgical Wound] [N/A:N/A] Comorbid History: [2:Arrhythmia, Hypertension, Gout, Osteoarthritis] [3:Arrhythmia, Hypertension, Gout, Osteoarthritis] [N/A:N/A] Date Acquired: [2:12/15/2017] [3:02/28/2018] [N/A:N/A] Weeks of Treatment: [2:1] [3:1] [N/A:N/A] Wound  Status: [2:Open] [3:Open] [N/A:N/A] Measurements L x W x D [2:0.4x0.5x0.1] [3:0.7x0.6x0.2] [N/A:N/A] (cm) Area (cm) : [2:0.157] [3:0.33] [N/A:N/A] Volume (cm) : [2:0.016] [3:0.066] [N/A:N/A] % Reduction in Area: [2:83.30%] [3:53.30%] [N/A:N/A] % Reduction in Volume: [2:83.00%] [3:53.20%] [N/A:N/A] Classification: [2:Full Thickness Without Exposed Support Structures] [3:Partial Thickness] [N/A:N/A] Exudate Amount: [2:Small] [3:Medium] [N/A:N/A] Exudate Type: [2:Serous] [3:Purulent] [N/A:N/A] Exudate Color: [2:amber] [3:yellow, brown, green] [N/A:N/A] Wound Margin: [2:Flat and Intact] [3:Flat and Intact] [N/A:N/A] Granulation Amount: [2:Small (1-33%)] [3:None Present (0%)] [N/A:N/A] Necrotic Amount: [2:Large (67-100%)] [3:None Present (0%)] [N/A:N/A] Exposed Structures: [2:Fat Layer (Subcutaneous Tissue) Exposed: Yes Fascia: No Tendon: No Muscle: No Joint: No Bone: No] [3:Fat Layer (Subcutaneous Tissue) Exposed: Yes Fascia: No Tendon: No Muscle: No Joint: No Bone: No]  [N/A:N/A] Epithelialization: [2:None] [3:None] [N/A:N/A] Periwound Skin Texture: [2:Excoriation: No Induration: No Callus: No Crepitus: No Rash: No Scarring: No] [3:Excoriation: No Induration: No Callus: No Crepitus: No Rash: No Scarring: No] [N/A:N/A] Periwound Skin Moisture: Maceration: No Maceration: No N/A Dry/Scaly: No Dry/Scaly: No Periwound Skin Color: Hemosiderin Staining: Yes Atrophie Blanche: No N/A Atrophie Blanche: No Cyanosis: No Cyanosis: No Ecchymosis: No Ecchymosis: No Erythema: No Erythema: No Hemosiderin Staining: No Mottled: No Mottled: No Pallor: No Pallor: No Rubor: No Rubor: No Temperature: No Abnormality No Abnormality N/A Tenderness on Palpation: Yes Yes N/A Wound Preparation: Ulcer Cleansing: Ulcer Cleansing: N/A Rinsed/Irrigated with Saline Rinsed/Irrigated with Saline Topical Anesthetic Applied: Topical Anesthetic Applied: Other: lidocaine 4% None Treatment Notes Electronic Signature(s) Signed: 03/07/2018 3:29:00 PM By: Montey Hora Entered By: Montey Hora on 03/07/2018 10:19:40 Joanne Michael ([id number]) -------------------------------------------------------------------------------- [place] Details Patient Name: Joanne Michael Date of Service: 03/07/2018 9:15 AM Medical Record Number: [id number] Patient Account Number: [id number] Date of Birth/Sex: 1924-04-22 (85 y.o. F) Treating RN: Montey Hora Primary Care Jonai Weyland: Dion Body Other Clinician: Referring Minnie Shi: Dion Body Treating Timeka Goette/Extender: Melburn Hake, Joanne Weeks in Treatment: 1 Active Inactive Wound/Skin Impairment Nursing Diagnoses: Impaired tissue integrity Goals: Ulcer/skin breakdown will have a volume reduction of 30% by week 4 Date Initiated: 02/28/2018 Target Resolution Date: 03/31/2018 Goal Status: Active Interventions: Assess patient/caregiver ability to obtain necessary supplies Assess patient/caregiver ability  to perform ulcer/skin care regimen upon admission and as needed Assess ulceration(s) every visit Notes: Electronic Signature(s) Signed: 03/07/2018 3:29:00 PM By: Montey Hora Entered By: Montey Hora on 03/07/2018 10:19:24 Joanne Michael ([id number]) -------------------------------------------------------------------------------- Pain Assessment Details Patient Name: Joanne Michael Date of Service: 03/07/2018 9:15 AM Medical Record Number: [id number] Patient Account Number: [id number] Date of Birth/Sex: 05/05/1924 (85 y.o. F) Treating RN: Montey Hora Primary Care Jahi Roza: Dion Body Other Clinician: Referring Daven Pinckney: Dion Body Treating Delvonte Berenson/Extender: Melburn Hake, Joanne Weeks in Treatment: 1 Active Problems Location of Pain Severity and Description of Pain Patient Has Paino No Site Locations Pain Management and Medication Current Pain Management: Electronic Signature(s) Signed: 03/07/2018 3:29:00 PM By: Montey Hora Signed: 03/07/2018 3:31:07 PM By: Lorine Bears RCP, RRT, CHT Entered By: Lorine Bears on 03/07/2018 09:18:54 Joanne Michael ([id number]) -------------------------------------------------------------------------------- Patient/Caregiver Education Details Patient Name: Joanne Michael Date of Service: 03/07/2018 9:15 AM Medical Record Number: [id number] Patient Account Number: [id number] Date of Birth/Gender: 13-Mar-1924 (85 y.o. F) Treating RN: Montey Hora Primary Care Physician: Dion Body Other Clinician: Referring Physician: Dion Body Treating Physician/Extender: Sharalyn Ink in Treatment: 1 Education Assessment Education Provided To: Patient and Caregiver Education Topics Provided Wound/Skin Impairment: Handouts: Other: wound care as ordered Methods: Demonstration, Explain/Verbal Responses: State content correctly Electronic Signature(s) Signed: 03/07/2018  3:29:00 PM By:  Dorthy, Di Kindle Entered By: Montey Hora on 03/07/2018 10:27:52 Joanne Michael ([id number]) -------------------------------------------------------------------------------- Wound Assessment Details Patient Name: CHARMANE, PROTZMAN Date of Service: 03/07/2018 9:15 AM Medical Record Number: [id number] Patient Account Number: [id number] Date of Birth/Sex: 10-10-1924 (85 y.o. F) Treating RN: Harold Barban Primary Care Muhamed Luecke: Dion Body Other Clinician: Referring Rakim Moone: Dion Body Treating Kaliah Haddaway/Extender: Melburn Hake, Joanne Weeks in Treatment: 1 Wound Status Wound Number: 2 Primary Etiology: Trauma, Other Wound Location: Left Lower Leg - Posterior Wound Status: Open Wounding Event: Trauma Comorbid Arrhythmia, Hypertension, Gout, History: Osteoarthritis Date Acquired: 12/15/2017 Weeks Of Treatment: 1 Clustered Wound: No Photos Photo Uploaded By: Harold Barban on 03/07/2018 11:21:03 Wound Measurements Length: (cm) 0.4 Width: (cm) 0.5 Depth: (cm) 0.1 Area: (cm) 0.157 Volume: (cm) 0.016 % Reduction in Area: 83.3% % Reduction in Volume: 83% Epithelialization: None Tunneling: No Undermining: No Wound Description Full Thickness Without Exposed Support Classification: Structures Wound Margin: Flat and Intact Exudate Small Amount: Exudate Type: Serous Exudate Color: amber Foul Odor After Cleansing: No Slough/Fibrino Yes Wound Bed Granulation Amount: Small (1-33%) Exposed Structure Necrotic Amount: Large (67-100%) Fascia Exposed: No Necrotic Quality: Adherent Slough Fat Layer (Subcutaneous Tissue) Exposed: Yes Tendon Exposed: No Muscle Exposed: No Joint Exposed: No Bone Exposed: No Beaver, Ella O. ([id number]) Periwound Skin Texture Texture Color No Abnormalities Noted: No No Abnormalities Noted: No Callus: No Atrophie Blanche: No Crepitus: No Cyanosis: No Excoriation: No Ecchymosis: No Induration: No Erythema:  No Rash: No Hemosiderin Staining: Yes Scarring: No Mottled: No Pallor: No Moisture Rubor: No No Abnormalities Noted: No Dry / Scaly: No Temperature / Pain Maceration: No Temperature: No Abnormality Tenderness on Palpation: Yes Wound Preparation Ulcer Cleansing: Rinsed/Irrigated with Saline Topical Anesthetic Applied: Other: lidocaine 4%, Treatment Notes Wound #2 (Left, Posterior Lower Leg) Notes chest and lower leg - prisma and non ahderent pad, chest coverlet, leg tegaderm Electronic Signature(s) Signed: 03/10/2018 3:50:02 PM By: Harold Barban Entered By: Harold Barban on 03/07/2018 09:37:56 Joanne Michael ([id number]) -------------------------------------------------------------------------------- Wound Assessment Details Patient Name: Joanne Michael Date of Service: 03/07/2018 9:15 AM Medical Record Number: [id number] Patient Account Number: [id number] Date of Birth/Sex: 27-Mar-1924 (85 y.o. F) Treating RN: Harold Barban Primary Care Chinmayi Rumer: Dion Body Other Clinician: Referring Audrick Lamoureaux: Dion Body Treating Tommy Goostree/Extender: Melburn Hake, Joanne Weeks in Treatment: 1 Wound Status Wound Number: 3 Primary Etiology: Open Surgical Wound Wound Location: Chest - Midline Wound Status: Open Wounding Event: Surgical Injury Comorbid Arrhythmia, Hypertension, Gout, History: Osteoarthritis Date Acquired: 02/28/2018 Weeks Of Treatment: 1 Clustered Wound: No Photos Photo Uploaded By: Harold Barban on 03/07/2018 11:21:04 Wound Measurements Length: (cm) 0.7 Width: (cm) 0.6 Depth: (cm) 0.2 Area: (cm) 0.33 Volume: (cm) 0.066 % Reduction in Area: 53.3% % Reduction in Volume: 53.2% Epithelialization: None Tunneling: No Undermining: No Wound Description Classification: Partial Thickness Foul Wound Margin: Flat and Intact Sloug Exudate Amount: Medium Exudate Type: Purulent Exudate Color: yellow, brown, green Odor After Cleansing: No h/Fibrino  No Wound Bed Granulation Amount: None Present (0%) Exposed Structure Necrotic Amount: None Present (0%) Fascia Exposed: No Fat Layer (Subcutaneous Tissue) Exposed: Yes Tendon Exposed: No Muscle Exposed: No Joint Exposed: No Bone Exposed: No Periwound Skin Texture Vanhorne, Shalese O. ([id number]) Texture Color No Abnormalities Noted: No No Abnormalities Noted: No Callus: No Atrophie Blanche: No Crepitus: No Cyanosis: No Excoriation: No Ecchymosis: No Induration: No Erythema: No Rash: No Hemosiderin Staining: No Scarring: No Mottled: No Pallor: No Moisture Rubor: No No Abnormalities Noted: No Dry / Scaly: No Temperature / Pain Maceration: No Temperature:  No Abnormality Tenderness on Palpation: Yes Wound Preparation Ulcer Cleansing: Rinsed/Irrigated with Saline Topical Anesthetic Applied: None Treatment Notes Wound #3 (Midline Chest) Notes chest and lower leg - prisma and non ahderent pad, chest coverlet, leg tegaderm Electronic Signature(s) Signed: 03/10/2018 3:50:02 PM By: Harold Barban Entered By: Harold Barban on 03/07/2018 09:38:42 Joanne Michael ([id number]) -------------------------------------------------------------------------------- Vitals Details Patient Name: Joanne Michael Date of Service: 03/07/2018 9:15 AM Medical Record Number: [id number] Patient Account Number: [id number] Date of Birth/Sex: 25-Apr-1924 (85 y.o. F) Treating RN: Montey Hora Primary Care Alondria Mousseau: Dion Body Other Clinician: Referring Montoya Brandel: Dion Body Treating Myka Lukins/Extender: Melburn Hake, Joanne Weeks in Treatment: 1 Vital Signs Time Taken: 09:18 Temperature (F): 97.7 Height (in): 62 Pulse (bpm): 46 Weight (lbs): 115 Respiratory Rate (breaths/min): 18 Body Mass Index (BMI): 21 Blood Pressure (mmHg): 140/35 Reference Range: 80 - 120 mg / dl Notes Took blood pressure with dynamap for a reading of 143/22. Blood pressure was taken again with the  manual cuff for a reading of 140/38. Electronic Signature(s) Signed: 03/07/2018 3:31:07 PM By: Lorine Bears RCP, RRT, CHT Entered By: Lorine Bears on 03/07/2018 09:25:34

## 2018-03-10 NOTE — Progress Notes (Addendum)
Joanne Michael, Joanne Michael ([id number]) Visit Report for 03/07/2018 Chief Complaint Document Details Patient Name: Joanne Michael, Joanne Michael. Date of Service: 03/07/2018 9:15 AM Medical Record Number: [id number] Patient Account Number: [id number] Date of Birth/Sex: 07-31-1924 (85 y.o. F) Treating RN: Montey Hora Primary Care Provider: Dion Body Other Clinician: Referring Provider: Dion Body Treating Provider/Extender: Melburn Hake, Joanne Weeks in Treatment: 1 Information Obtained from: Patient Chief Complaint Left LE ulcer due to trauma, chest wall skin lesion, and heel pain bilateral Electronic Signature(s) Signed: 03/10/2018 7:30:10 AM By: Worthy Keeler PA-C Entered By: Worthy Keeler on 03/07/2018 09:19:41 Joanne Michael ([id number]) -------------------------------------------------------------------------------- Debridement Details Patient Name: Joanne Michael Date of Service: 03/07/2018 9:15 AM Medical Record Number: [id number] Patient Account Number: [id number] Date of Birth/Sex: [birth year] (85 y.o. F) Treating RN: Montey Hora Primary Care Provider: Dion Body Other Clinician: Referring Provider: Dion Body Treating Provider/Extender: Melburn Hake, Joanne Weeks in Treatment: 1 Debridement Performed for Wound #3 Midline Chest Assessment: Performed By: Physician STONE III, Joanne E., PA-C Debridement Type: Debridement Level of Consciousness (Pre- Awake and Alert procedure): Pre-procedure Verification/Time Yes - 10:22 Out Taken: Start Time: 10:22 Pain Control: Lidocaine 4% Topical Solution Total Area Debrided (L x W): 0.7 (cm) x 0.6 (cm) = 0.42 (cm) Tissue and other material Non-Viable, Spelter debrided: Level: Non-Viable Tissue Debridement Description: Selective/Open Wound Instrument: Forceps, Scissors Bleeding: Minimum Hemostasis Achieved: Pressure End Time: 10:24 Procedural Pain: 0 Post Procedural Pain: 0 Response to Treatment:  Procedure was tolerated well Level of Consciousness Awake and Alert (Post-procedure): Post Debridement Measurements of Total Wound Length: (cm) 0.7 Width: (cm) 0.6 Depth: (cm) 0.5 Volume: (cm) 0.165 Character of Wound/Ulcer Post Debridement: Improved Post Procedure Diagnosis Same as Pre-procedure Electronic Signature(s) Signed: 03/07/2018 3:29:00 PM By: Montey Hora Signed: 03/10/2018 7:30:10 AM By: Worthy Keeler PA-C Entered By: Montey Hora on 03/07/2018 10:24:35 Joanne Michael ([id number]) -------------------------------------------------------------------------------- HPI Details Patient Name: Joanne Michael Date of Service: 03/07/2018 9:15 AM Medical Record Number: [id number] Patient Account Number: [id number] Date of Birth/Sex: 07/25/1924 (85 y.o. F) Treating RN: Montey Hora Primary Care Provider: Dion Body Other Clinician: Referring Provider: Dion Body Treating Provider/Extender: Melburn Hake, Joanne Weeks in Treatment: 1 History of Present Illness Location: Patient presents with an ulcer on the left lower extremity in the anterior lateral calf area Quality: Patient reports experiencing a throbbing pain to affected area intermittently Severity: Patient states wound are getting better Duration: Patient has had the wound for > 2 months prior to seeking treatment at the wound center Timing: Pain in wound is Intermittent (comes and goes Context: The wound occurred when the patient had a blunt trauma to the legs and had a large swelling Modifying Factors: Other treatment(s) tried include:local care with antibiotic ointment Associated Signs and Symptoms: Patient reports having increase swelling. HPI Description: 85 year old patient referred to [place] by Ardyth Man, PA, for a ulcer on the left lower extremity, with cellulitis caused by trauma 2 months ago. past medical history of atrial fibrillation, chronic renal failure stage II, essential  hypertension, and deficiency anemia, osteoporosis and spinal stenosis. She is status post appendectomy, gallbladder surgery, hernia repair, hysterectomy, knee replacement. She was recently placed on doxycycline. 08/06/2016 -- she has not been using her lymphedema pumps and I have urged her to do this. She will be going off to the beach for a week and will see as the week after 08/20/16 on evaluation today patient's wound of the left lower extremity appears to be doing very well. She is  been tolerating the dressing changes without complication and she is pleased with how this is progressing. There is no evidence of infection. 09/03/16 on evaluation today patient appears to be doing well in regard to her wound. Unfortunately she missed her appointment last week because she was actually the hospital due to pneumonia. She has done well in recovery following that time and they took care for wounded very well for her during the time that she was in the hospital. She has no other worsening issues and no evidence of infection. 09/10/16 Patient's wound on the left lower extremity appears to be doing significantly better on evaluation today. There is some slight hyper granular tissue but overall this is showing good signs of improvement which is good news. She is pleased with how this is progressing. Readmission: 02/28/18 on evaluation today patient presents for multiple complaints for which she would like evaluation at this point. She has a history of atrial fibrillation, lymphedema, and currently has a wound on the left lower extremity that appears to be secondary to lymphedema. She's also having issues with her bilateral heels which are hurting her at this point although she does not have any open wounds or signs of deep tissue injury at the sites. She states that when she lies in bed on her back that's when it typically hurts but again I see no evidence of pressure injury. It sounds to me that she may be  experiencing some wrap up the in this regard. Lastly she has an area on her central chest where there is a lesion that is beginning to become painful to her to touch. She states this came up over the past several months although she doesn't know exactly when she first saw this. There is no sign of fluid underneath this area and it does not appear to be an abscess at all as best I could tell. Nonetheless it does have me somewhat concerned about the possibility of a skin cancer just based on the appearance today. No fevers, chills, nausea, or vomiting noted at this time. 03/07/18 on evaluation today patient actually appears to be doing decently well all things considering in regard to her ulcers. Overall in regard to the lower extremity ulcer she's made good improvement. In regard to the chest ulceration which is a result of the biopsy it appears that she is doing better. We did get the report back which seems to indicate that she may have clear margins although I do not know that for sure based on the fact that there is some conflicting information in the first paragraph stating that this appear to be a superficial portion of a squamous cell carcinoma. Nonetheless it goes on to state that the [facility]. ([id number]) section through the middle indicates that there are clear margins in regard to what was removed. Nonetheless I did contact the pathologist who was going to apply an addendum to the report to clarify after looking back at the slides. I will contact the patient as soon as I have that information. We will make additional follow-ups as necessary based on the results. Electronic Signature(s) Signed: 03/16/2018 1:38:17 AM By: Worthy Keeler PA-C Entered By: Worthy Keeler on 03/16/2018 01:34:05 Joanne Michael ([id number]) -------------------------------------------------------------------------------- Physical Exam Details Patient Name: Joanne Michael Date of Service:  03/07/2018 9:15 AM Medical Record Number: [id number] Patient Account Number: [id number] Date of Birth/Sex: 1924/11/26 (85 y.o. F) Treating RN: Montey Hora Primary Care Provider: Dion Body Other Clinician: Referring Provider:  Dion Body Treating Provider/Extender: STONE III, Joanne Weeks in Treatment: 1 Constitutional Well-nourished and well-hydrated in no acute distress. Respiratory normal breathing without difficulty. clear to auscultation bilaterally. Cardiovascular regular rate and rhythm with normal S1, S2. Psychiatric this patient is able to make decisions and demonstrates good insight into disease process. Alert and Oriented x 3. pleasant and cooperative. Notes Patient's wound bed currently shows evidence of good granulation at this time which is good news. This is in regard to the lower extremity. With regard to the chest ulcer there was some mild debridement required at the point where I had the silver nitrate last week I was able to clear this way however without complication. She had minimal bleeding. Overall I feel like both areas are appropriate for Prisma. Electronic Signature(s) Signed: 03/16/2018 1:38:17 AM By: Worthy Keeler PA-C Entered By: Worthy Keeler on 03/16/2018 01:34:29 Joanne Michael ([id number]) -------------------------------------------------------------------------------- Physician Orders Details Patient Name: Joanne Michael Date of Service: 03/07/2018 9:15 AM Medical Record Number: [id number] Patient Account Number: [id number] Date of Birth/Sex: [birth year] (85 y.o. F) Treating RN: Montey Hora Primary Care Provider: Dion Body Other Clinician: Referring Provider: Dion Body Treating Provider/Extender: Melburn Hake, Joanne Weeks in Treatment: 1 Verbal / Phone Orders: No Diagnosis Coding ICD-10 Coding Code Description I89.0 Lymphedema, not elsewhere classified L97.822 Non-pressure chronic ulcer of other part of  left lower leg with fat layer exposed L98.9 Disorder of the skin and subcutaneous tissue, unspecified I48.0 Paroxysmal atrial fibrillation Wound Cleansing Wound #2 Left,Posterior Lower Leg o May Shower, gently pat wound dry prior to applying new dressing. Wound #3 Midline Chest o May Shower, gently pat wound dry prior to applying new dressing. Primary Wound Dressing Wound #2 Left,Posterior Lower Leg o Silver Collagen Wound #3 Midline Chest o Silver Collagen Secondary Dressing Wound #2 Left,Posterior Lower Leg o Tegaderm o Non-adherent pad Wound #3 Midline Chest o Other - coverlet or bandaid Dressing Change Frequency Wound #2 Left,Posterior Lower Leg o Change dressing every other day. Wound #3 Midline Chest o Change dressing every other day. Follow-up Appointments o Return Appointment in 1 week. Electronic Signature(s) Signed: 03/07/2018 3:29:00 PM By: Francesco Runner ([id number]) Signed: 03/10/2018 7:30:10 AM By: Worthy Keeler PA-C Entered By: Montey Hora on 03/07/2018 10:26:53 Joanne Michael ([id number]) -------------------------------------------------------------------------------- Problem List Details Patient Name: Joanne Michael, Joanne Michael Date of Service: 03/07/2018 9:15 AM Medical Record Number: [id number] Patient Account Number: [id number] Date of Birth/Sex: [birth year] (85 y.o. F) Treating RN: Montey Hora Primary Care Provider: Dion Body Other Clinician: Referring Provider: Dion Body Treating Provider/Extender: Melburn Hake, Joanne Weeks in Treatment: 1 Active Problems ICD-10 Evaluated Encounter Code Description Active Date Today Diagnosis I89.0 Lymphedema, not elsewhere classified 02/28/2018 No Yes L97.822 Non-pressure chronic ulcer of other part of left lower leg with 02/28/2018 No Yes fat layer exposed L98.9 Disorder of the skin and subcutaneous tissue, unspecified 02/28/2018 No Yes I48.0 Paroxysmal atrial  fibrillation 02/28/2018 No Yes Inactive Problems Resolved Problems Electronic Signature(s) Signed: 03/10/2018 7:30:10 AM By: Worthy Keeler PA-C Entered By: Worthy Keeler on 03/07/2018 09:19:36 Joanne Michael ([id number]) -------------------------------------------------------------------------------- Progress Note Details Patient Name: Joanne Michael Date of Service: 03/07/2018 9:15 AM Medical Record Number: [id number] Patient Account Number: [id number] Date of Birth/Sex: 12/13/1924 (85 y.o. F) Treating RN: Montey Hora Primary Care Provider: Dion Body Other Clinician: Referring Provider: Dion Body Treating Provider/Extender: Melburn Hake, Joanne Weeks in Treatment: 1 Subjective Chief Complaint Information obtained from Patient Left LE ulcer due to trauma, chest  wall skin lesion, and heel pain bilateral History of Present Illness (HPI) The following HPI elements were documented for the patient's wound: Location: Patient presents with an ulcer on the left lower extremity in the anterior lateral calf area Quality: Patient reports experiencing a throbbing pain to affected area intermittently Severity: Patient states wound are getting better Duration: Patient has had the wound for > 2 months prior to seeking treatment at the wound center Timing: Pain in wound is Intermittent (comes and goes Context: The wound occurred when the patient had a blunt trauma to the legs and had a large swelling Modifying Factors: Other treatment(s) tried include:local care with antibiotic ointment Associated Signs and Symptoms: Patient reports having increase swelling. 85 year old patient referred to [place] by Ardyth Man, PA, for a ulcer on the left lower extremity, with cellulitis caused by trauma 2 months ago. past medical history of atrial fibrillation, chronic renal failure stage II, essential hypertension, and deficiency anemia, osteoporosis and spinal stenosis. She is status post  appendectomy, gallbladder surgery, hernia repair, hysterectomy, knee replacement. She was recently placed on doxycycline. 08/06/2016 -- she has not been using her lymphedema pumps and I have urged her to do this. She will be going off to the beach for a week and will see as the week after 08/20/16 on evaluation today patient's wound of the left lower extremity appears to be doing very well. She is been tolerating the dressing changes without complication and she is pleased with how this is progressing. There is no evidence of infection. 09/03/16 on evaluation today patient appears to be doing well in regard to her wound. Unfortunately she missed her appointment last week because she was actually the hospital due to pneumonia. She has done well in recovery following that time and they took care for wounded very well for her during the time that she was in the hospital. She has no other worsening issues and no evidence of infection. 09/10/16 Patient's wound on the left lower extremity appears to be doing significantly better on evaluation today. There is some slight hyper granular tissue but overall this is showing good signs of improvement which is good news. She is pleased with how this is progressing. Readmission: 02/28/18 on evaluation today patient presents for multiple complaints for which she would like evaluation at this point. She has a history of atrial fibrillation, lymphedema, and currently has a wound on the left lower extremity that appears to be secondary to lymphedema. She's also having issues with her bilateral heels which are hurting her at this point although she does not have any open wounds or signs of deep tissue injury at the sites. She states that when she lies in bed on her back that's when it typically hurts but again I see no evidence of pressure injury. It sounds to me that she may be experiencing some wrap up the in this regard. Lastly she has an area on her central chest  where there is a lesion that is beginning to become painful to her to touch. She states this came up over the past several months although she doesn't know exactly when she first saw this. There is no sign of fluid underneath this area and it does not appear to be an abscess at all as best I could tell. Nonetheless it Joanne Michael, Joanne Michael ([id number]) does have me somewhat concerned about the possibility of a skin cancer just based on the appearance today. No fevers, chills, nausea, or vomiting noted at this time. 03/07/18  on evaluation today patient actually appears to be doing decently well all things considering in regard to her ulcers. Overall in regard to the lower extremity ulcer she's made good improvement. In regard to the chest ulceration which is a result of the biopsy it appears that she is doing better. We did get the report back which seems to indicate that she may have clear margins although I do not know that for sure based on the fact that there is some conflicting information in the first paragraph stating that this appear to be a superficial portion of a squamous cell carcinoma. Nonetheless it goes on to state that the section through the middle indicates that there are clear margins in regard to what was removed. Nonetheless I did contact the pathologist who was going to apply an addendum to the report to clarify after looking back at the slides. I will contact the patient as soon as I have that information. We will make additional follow-ups as necessary based on the results. Patient History Information obtained from Patient. Family History Cancer - Siblings,Mother, Lung Disease - Siblings, Stroke - Siblings, No family history of Hypertension, Kidney Disease, Thyroid Problems, Tuberculosis. Social History Never smoker, Marital Status - Widowed, Alcohol Use - Never, Drug Use - No History, Caffeine Use - Daily. Medical And Surgical History Notes Respiratory mycobacterial  pulmonary disease Cardiovascular varicose veins Review of Systems (ROS) Constitutional Symptoms (General Health) Denies complaints or symptoms of Fever, Chills. Respiratory The patient has no complaints or symptoms. Cardiovascular The patient has no complaints or symptoms. Psychiatric The patient has no complaints or symptoms. Objective Constitutional Well-nourished and well-hydrated in no acute distress. Vitals Time Taken: 9:18 AM, Height: 62 in, Weight: 115 lbs, BMI: 21, Temperature: 97.7 F, Pulse: 46 bpm, Respiratory Rate: 18 breaths/min, Blood Pressure: 140/35 mmHg. General Notes: Took blood pressure with dynamap for a reading of 143/22. Blood pressure was taken again with the manual cuff for a reading of 140/38. Respiratory Joanne Michael, Joanne Michael ([id number]) normal breathing without difficulty. clear to auscultation bilaterally. Cardiovascular regular rate and rhythm with normal S1, S2. Psychiatric this patient is able to make decisions and demonstrates good insight into disease process. Alert and Oriented x 3. pleasant and cooperative. General Notes: Patient's wound bed currently shows evidence of good granulation at this time which is good news. This is in regard to the lower extremity. With regard to the chest ulcer there was some mild debridement required at the point where I had the silver nitrate last week I was able to clear this way however without complication. She had minimal bleeding. Overall I feel like both areas are appropriate for Prisma. Integumentary (Hair, Skin) Wound #2 status is Open. Original cause of wound was Trauma. The wound is located on the Left,Posterior Lower Leg. The wound measures 0.4cm length x 0.5cm width x 0.1cm depth; 0.157cm^2 area and 0.016cm^3 volume. There is Fat Layer (Subcutaneous Tissue) Exposed exposed. There is no tunneling or undermining noted. There is a small amount of serous drainage noted. The wound margin is flat and intact.  There is small (1-33%) granulation within the wound bed. There is a large (67-100%) amount of necrotic tissue within the wound bed including Adherent Slough. The periwound skin appearance exhibited: Hemosiderin Staining. The periwound skin appearance did not exhibit: Callus, Crepitus, Excoriation, Induration, Rash, Scarring, Dry/Scaly, Maceration, Atrophie Blanche, Cyanosis, Ecchymosis, Mottled, Pallor, Rubor, Erythema. Periwound temperature was noted as No Abnormality. The periwound has tenderness on palpation. Wound #3 status is Open. Original  cause of wound was Surgical Injury. The wound is located on the Midline Chest. The wound measures 0.7cm length x 0.6cm width x 0.2cm depth; 0.33cm^2 area and 0.066cm^3 volume. There is Fat Layer (Subcutaneous Tissue) Exposed exposed. There is no tunneling or undermining noted. There is a medium amount of purulent drainage noted. The wound margin is flat and intact. There is no granulation within the wound bed. There is no necrotic tissue within the wound bed. The periwound skin appearance did not exhibit: Callus, Crepitus, Excoriation, Induration, Rash, Scarring, Dry/Scaly, Maceration, Atrophie Blanche, Cyanosis, Ecchymosis, Hemosiderin Staining, Mottled, Pallor, Rubor, Erythema. Periwound temperature was noted as No Abnormality. The periwound has tenderness on palpation. Assessment Active Problems ICD-10 Lymphedema, not elsewhere classified Non-pressure chronic ulcer of other part of left lower leg with fat layer exposed Disorder of the skin and subcutaneous tissue, unspecified Paroxysmal atrial fibrillation Procedures Wound #3 Pre-procedure diagnosis of Wound #3 is an Open Surgical Wound located on the Midline Chest . There was a Selective/Open Wound Non-Viable Tissue Debridement with a total area of 0.42 sq cm performed by STONE III, Joanne E., PA-C. With the following instrument(s): Forceps, and Scissors to remove Non-Viable tissue/material. Material  removed includes Slough after achieving pain control using Lidocaine 4% Topical Solution. No specimens were taken. A time out was conducted at Joanne Michael, Joanne Michael ([id number]) prior to the start of the procedure. A Minimum amount of bleeding was controlled with Pressure. The procedure was tolerated well with a pain level of 0 throughout and a pain level of 0 following the procedure. Post Debridement Measurements: 0.7cm length x 0.6cm width x 0.5cm depth; 0.165cm^3 volume. Character of Wound/Ulcer Post Debridement is improved. Post procedure Diagnosis Wound #3: Same as Pre-Procedure Plan Wound Cleansing: Wound #2 Left,Posterior Lower Leg: May Shower, gently pat wound dry prior to applying new dressing. Wound #3 Midline Chest: May Shower, gently pat wound dry prior to applying new dressing. Primary Wound Dressing: Wound #2 Left,Posterior Lower Leg: Silver Collagen Wound #3 Midline Chest: Silver Collagen Secondary Dressing: Wound #2 Left,Posterior Lower Leg: Tegaderm Non-adherent pad Wound #3 Midline Chest: Other - coverlet or bandaid Dressing Change Frequency: Wound #2 Left,Posterior Lower Leg: Change dressing every other day. Wound #3 Midline Chest: Change dressing every other day. Follow-up Appointments: Return Appointment in 1 week. My suggestion at this point is going to be that we continue with the above wound care measures for the next week. The patient is in agreement with the plan. Subsequently we will see were things stand at follow-up. If anything changes she will contact the office and let me know. Please see above for specific wound care orders. We will see patient for re-evaluation in 1 week(s) here in the clinic. If anything worsens or changes patient will contact our office for additional recommendations. I will let her know as soon as I have any information with regard to the addendum to the pathology report. Electronic Signature(s) Signed: 03/16/2018 1:38:17  AM By: Worthy Keeler PA-C Entered By: Worthy Keeler on 03/16/2018 01:34:43 Joanne Michael ([id number]) -------------------------------------------------------------------------------- ROS/PFSH Details Patient Name: Joanne Michael Date of Service: 03/07/2018 9:15 AM Medical Record Number: [id number] Patient Account Number: [id number] Date of Birth/Sex: 16-Jan-1925 (85 y.o. F) Treating RN: Montey Hora Primary Care Provider: Dion Body Other Clinician: Referring Provider: Dion Body Treating Provider/Extender: Melburn Hake, Joanne Weeks in Treatment: 1 Information Obtained From Patient Wound History Do you currently have one or more open woundso No Have you tested positive for osteomyelitis (bone  infection)o No Have you had any tests for circulation on your legso No Constitutional Symptoms (General Health) Complaints and Symptoms: Negative for: Fever; Chills Eyes Medical History: Negative for: Cataracts; Glaucoma; Optic Neuritis Ear/Nose/Mouth/Throat Medical History: Negative for: Chronic sinus problems/congestion; Middle ear problems Hematologic/Lymphatic Medical History: Negative for: Anemia; Hemophilia; Human Immunodeficiency Virus; Lymphedema; Sickle Cell Disease Respiratory Complaints and Symptoms: No Complaints or Symptoms Medical History: Negative for: Aspiration; Asthma; Chronic Obstructive Pulmonary Disease (COPD); Pneumothorax; Sleep Apnea; Tuberculosis Past Medical History Notes: mycobacterial pulmonary disease Cardiovascular Complaints and Symptoms: No Complaints or Symptoms Medical History: Positive for: Arrhythmia - a fib; Hypertension Negative for: Angina; Congestive Heart Failure; Coronary Artery Disease; Deep Vein Thrombosis; Hypotension; Myocardial Infarction; Peripheral Arterial Disease; Peripheral Venous Disease; Phlebitis; Vasculitis Past Medical History Notes: varicose veins Joanne Michael, Joanne Michael  ([id number]) Gastrointestinal Medical History: Negative for: Cirrhosis ; Colitis; Crohnos; Hepatitis A; Hepatitis B; Hepatitis C Endocrine Medical History: Negative for: Type I Diabetes; Type II Diabetes Immunological Medical History: Negative for: Lupus Erythematosus; Raynaudos; Scleroderma Integumentary (Skin) Medical History: Negative for: History of Burn; History of pressure wounds Musculoskeletal Medical History: Positive for: Gout; Osteoarthritis Negative for: Rheumatoid Arthritis; Osteomyelitis Neurologic Medical History: Negative for: Dementia; Neuropathy; Quadriplegia; Paraplegia; Seizure Disorder Oncologic Medical History: Negative for: Received Chemotherapy; Received Radiation Psychiatric Complaints and Symptoms: No Complaints or Symptoms Medical History: Negative for: Anorexia/bulimia; Confinement Anxiety Immunizations Pneumococcal Vaccine: Received Pneumococcal Vaccination: Yes Immunization Notes: up to date Implantable Devices Family and Social History Cancer: Yes - Siblings,Mother; Hypertension: No; Kidney Disease: No; Lung Disease: Yes - Siblings; Stroke: Yes - Siblings; Thyroid Problems: No; Tuberculosis: No; Never smoker; Marital Status - Widowed; Alcohol Use: Never; Drug Use: No History; Caffeine Use: Daily; Financial Concerns: No; Food, Clothing or Shelter Needs: No; Support System Lacking: No; Transportation Concerns: No; Advanced Directives: No; Patient does not want information on Advanced Directives; Do not resuscitate: No; Living Will: Yes (Not Provided); Medical Power of Attorney: Yes - daughter Joanne Michael (Not Provided) Joanne Michael, Joanne Michael ([id number]) Physician Affirmation I have reviewed and agree with the above information. Electronic Signature(s) Signed: 03/16/2018 1:38:17 AM By: Worthy Keeler PA-C Signed: 05/05/2018 10:45:26 AM By: Montey Hora Entered By: Worthy Keeler on 03/16/2018 01:34:19 Joanne Michael  ([id number]) -------------------------------------------------------------------------------- SuperBill Details Patient Name: Joanne Michael Date of Service: 03/07/2018 Medical Record Number: [id number] Patient Account Number: [id number] Date of Birth/Sex: Sep 12, 1924 (85 y.o. F) Treating RN: Montey Hora Primary Care Provider: Dion Body Other Clinician: Referring Provider: Dion Body Treating Provider/Extender: Melburn Hake, Joanne Weeks in Treatment: 1 Diagnosis Coding ICD-10 Codes Code Description I89.0 Lymphedema, not elsewhere classified L97.822 Non-pressure chronic ulcer of other part of left lower leg with fat layer exposed L98.9 Disorder of the skin and subcutaneous tissue, unspecified I48.0 Paroxysmal atrial fibrillation Facility Procedures CPT4 Code Description: 27517001 [id number] - DEBRIDE WOUND 1ST 20 SQ CM OR < ICD-10 Diagnosis Description L97.822 Non-pressure chronic ulcer of other part of left lower leg with Modifier: fat layer expos Quantity: 1 ed Physician Procedures CPT4 Code Description: 7494496 [id number] - WC PHYS DEBR WO ANESTH 20 SQ CM ICD-10 Diagnosis Description L97.822 Non-pressure chronic ulcer of other part of left lower leg with Modifier: fat layer expos Quantity: 1 ed Electronic Signature(s) Signed: 03/10/2018 7:30:10 AM By: Worthy Keeler PA-C Entered By: Worthy Keeler on 03/10/2018 07:02:40

## 2018-03-11 ENCOUNTER — Ambulatory Visit: Payer: Medicare Other | Admitting: Cardiovascular Disease

## 2018-03-11 ENCOUNTER — Encounter: Payer: Self-pay | Admitting: Cardiovascular Disease

## 2018-03-11 VITALS — BP 130/50 | HR 45 | Ht 62.0 in | Wt 113.5 lb

## 2018-03-11 DIAGNOSIS — N183 Chronic kidney disease, stage 3 unspecified: Secondary | ICD-10-CM

## 2018-03-11 DIAGNOSIS — I350 Nonrheumatic aortic (valve) stenosis: Secondary | ICD-10-CM

## 2018-03-11 DIAGNOSIS — I701 Atherosclerosis of renal artery: Secondary | ICD-10-CM

## 2018-03-11 DIAGNOSIS — I5032 Chronic diastolic (congestive) heart failure: Secondary | ICD-10-CM | POA: Diagnosis not present

## 2018-03-11 DIAGNOSIS — I480 Paroxysmal atrial fibrillation: Secondary | ICD-10-CM | POA: Diagnosis not present

## 2018-03-11 DIAGNOSIS — E782 Mixed hyperlipidemia: Secondary | ICD-10-CM

## 2018-03-11 NOTE — Patient Instructions (Addendum)
Use Tylenol and heat pack for pain  Dr. Sharlet Salina [facility] - pain medicine [address], [place], [place] [address] [phone]   Medication Instructions:  Please stop the coreg , heart rate is slow Stay on your other medications  If you need a refill on your cardiac medications before your next appointment, please call your pharmacy.    Lab work: No new labs needed   If you have labs (blood work) drawn today and your tests are completely normal, you will receive your results only by: Marland Kitchen MyChart Message (if you have MyChart) OR . A paper copy in the mail If you have any lab test that is abnormal or we need to change your treatment, we will call you to review the results.   Testing/Procedures: No new testing needed   Follow-Up: At [facility], you and your health needs are our priority.  As part of our continuing mission to provide you with exceptional heart care, we have created designated Provider Care Teams.  These Care Teams include your primary Cardiologist (physician) and Advanced Practice Providers (APPs -  Physician Assistants and Nurse Practitioners) who all work together to provide you with the care you need, when you need it.  . You will need a follow up appointment in one year  . Providers on your designated Care Team:   . Murray Hodgkins, NP . Christell Faith, PA-C . Marrianne Mood, PA-C  Any Other Special Instructions Will Be Listed Below (If Applicable).  For educational health videos Log in to : [email or web address] Or : [email or web address], password : [email or web address]

## 2018-03-14 ENCOUNTER — Encounter: Payer: Medicare Other | Admitting: Physician Assistant

## 2018-03-14 DIAGNOSIS — E11622 Type 2 diabetes mellitus with other skin ulcer: Secondary | ICD-10-CM | POA: Diagnosis not present

## 2018-03-20 NOTE — Progress Notes (Signed)
KORYNN, KENEDY ([id number]) Visit Report for 03/14/2018 Arrival Information Details Patient Name: MARRION, FINAN Date of Service: 03/14/2018 10:15 AM Medical Record Number: [id number] Patient Account Number: [id number] Date of Birth/Sex: 03-28-1924 (85 y.o. F) Treating RN: Montey Hora Primary Care Kieth Hartis: Dion Body Other Clinician: Referring Yandiel Bergum: Dion Body Treating Hildagarde Holleran/Extender: Melburn Hake, HOYT Weeks in Treatment: 2 Visit Information History Since Last Visit Added or deleted any medications: No Patient Arrived: Ambulatory Any new allergies or adverse reactions: No Arrival Time: 10:07 Had a fall or experienced change in No Accompanied By: son in law activities of daily living that may affect Transfer Assistance: None risk of falls: Patient Identification Verified: Yes Signs or symptoms of abuse/neglect since last visito No Secondary Verification Process Completed: Yes Hospitalized since last visit: No Implantable device outside of the clinic excluding No cellular tissue based products placed in the center since last visit: Has Dressing in Place as Prescribed: Yes Pain Present Now: No Electronic Signature(s) Signed: 03/14/2018 3:49:07 PM By: Lorine Bears RCP, RRT, CHT Entered By: Lorine Bears on 03/14/2018 10:08:45 [name] ([id number]) -------------------------------------------------------------------------------- Clinic Level of Care Assessment Details Patient Name: [name] Date of Service: 03/14/2018 10:15 AM Medical Record Number: [id number] Patient Account Number: [id number] Date of Birth/Sex: Oct 29, 1924 (85 y.o. F) Treating RN: Montey Hora Primary Care Bettie Capistran: Dion Body Other Clinician: Referring Lance Huaracha: Dion Body Treating Jocsan Mcginley/Extender: Melburn Hake, HOYT Weeks in Treatment: 2 Clinic Level of Care Assessment Items TOOL 4 Quantity Score []  - Use when only  an EandM is performed on FOLLOW-UP visit 0 ASSESSMENTS - Nursing Assessment / Reassessment X - Reassessment of Co-morbidities (includes updates in patient status) 1 10 X- 1 5 Reassessment of Adherence to Treatment Plan ASSESSMENTS - Wound and Skin Assessment / Reassessment []  - Simple Wound Assessment / Reassessment - one wound 0 X- 2 5 Complex Wound Assessment / Reassessment - multiple wounds []  - 0 Dermatologic / Skin Assessment (not related to wound area) ASSESSMENTS - Focused Assessment []  - Circumferential Edema Measurements - multi extremities 0 []  - 0 Nutritional Assessment / Counseling / Intervention X- 1 5 Lower Extremity Assessment (monofilament, tuning fork, pulses) []  - 0 Peripheral Arterial Disease Assessment (using hand held doppler) ASSESSMENTS - Ostomy and/or Continence Assessment and Care []  - Incontinence Assessment and Management 0 []  - 0 Ostomy Care Assessment and Management (repouching, etc.) PROCESS - Coordination of Care X - Simple Patient / Family Education for ongoing care 1 15 []  - 0 Complex (extensive) Patient / Family Education for ongoing care X- 1 10 Staff obtains Programmer, systems, Records, Test Results / Process Orders []  - 0 Staff telephones HHA, Nursing Homes / Clarify orders / etc []  - 0 Routine Transfer to another Facility (non-emergent condition) []  - 0 Routine Hospital Admission (non-emergent condition) []  - 0 New Admissions / Biomedical engineer / Ordering NPWT, Apligraf, etc. []  - 0 Emergency Hospital Admission (emergent condition) X- 1 10 Simple Discharge Coordination HAJA, CREGO ([id number]) []  - 0 Complex (extensive) Discharge Coordination PROCESS - Special Needs []  - Pediatric / Minor Patient Management 0 []  - 0 Isolation Patient Management []  - 0 Hearing / Language / Visual special needs []  - 0 Assessment of Community assistance (transportation, D/C planning, etc.) []  - 0 Additional assistance / Altered mentation []   - 0 Support Surface(s) Assessment (bed, cushion, seat, etc.) INTERVENTIONS - Wound Cleansing / Measurement []  - Simple Wound Cleansing - one wound 0 X- 2 5 Complex Wound Cleansing - multiple wounds  X- 1 5 Wound Imaging (photographs - any number of wounds) []  - 0 Wound Tracing (instead of photographs) []  - 0 Simple Wound Measurement - one wound X- 2 5 Complex Wound Measurement - multiple wounds INTERVENTIONS - Wound Dressings X - Small Wound Dressing one or multiple wounds 1 10 []  - 0 Medium Wound Dressing one or multiple wounds []  - 0 Large Wound Dressing one or multiple wounds []  - 0 Application of Medications - topical []  - 0 Application of Medications - injection INTERVENTIONS - Miscellaneous []  - External ear exam 0 []  - 0 Specimen Collection (cultures, biopsies, blood, body fluids, etc.) []  - 0 Specimen(s) / Culture(s) sent or taken to Lab for analysis []  - 0 Patient Transfer (multiple staff / Civil Service fast streamer / Similar devices) []  - 0 Simple Staple / Suture removal (25 or less) []  - 0 Complex Staple / Suture removal (26 or more) []  - 0 Hypo / Hyperglycemic Management (close monitor of Blood Glucose) []  - 0 Ankle / Brachial Index (ABI) - do not check if billed separately X- 1 5 Vital Signs Gallaway, Xena O. ([id number]) Has the patient been seen at the hospital within the last three years: Yes Total Score: 105 Level Of Care: New/Established - Level 3 Electronic Signature(s) Signed: 03/14/2018 5:32:05 PM By: Montey Hora Entered By: Montey Hora on 03/14/2018 10:56:21 [name] ([id number]) -------------------------------------------------------------------------------- Encounter Discharge Information Details Patient Name: [name] Date of Service: 03/14/2018 10:15 AM Medical Record Number: [id number] Patient Account Number: [id number] Date of Birth/Sex: [birth year] (85 y.o. F) Treating RN: Montey Hora Primary Care Khamani Fairley: Dion Body Other Clinician: Referring Jori Frerichs: Dion Body Treating Zlatan Hornback/Extender: Melburn Hake, HOYT Weeks in Treatment: 2 Encounter Discharge Information Items Discharge Condition: Stable Ambulatory Status: Ambulatory Discharge Destination: Home Transportation: Private Auto Accompanied By: son Schedule Follow-up Appointment: Yes Clinical Summary of Care: Electronic Signature(s) Signed: 03/14/2018 5:32:05 PM By: Montey Hora Entered By: Montey Hora on 03/14/2018 10:57:11 [name] ([id number]) -------------------------------------------------------------------------------- Lower Extremity Assessment Details Patient Name: [name] Date of Service: 03/14/2018 10:15 AM Medical Record Number: [id number] Patient Account Number: [id number] Date of Birth/Sex: 02/28/1924 (85 y.o. F) Treating RN: Secundino Ginger Primary Care Katelynn Heidler: Dion Body Other Clinician: Referring Shalane Florendo: Dion Body Treating Basil Buffin/Extender: Melburn Hake, HOYT Weeks in Treatment: 2 Edema Assessment Assessed: [Left: No] [Right: No] [Left: Edema] [Right: :] Calf Left: Right: Point of Measurement: 27 cm From Medial Instep 30 cm cm Ankle Left: Right: Point of Measurement: 10 cm From Medial Instep 20.5 cm cm Vascular Assessment Claudication: Claudication Assessment [Left:None] Pulses: Posterior Tibial Extremity colors, hair growth, and conditions: Extremity Color: [Left:Hyperpigmented] Hair Growth on Extremity: [Left:No] Temperature of Extremity: [Left:Cool] Capillary Refill: [Left:< 3 seconds] Toe Nail Assessment Left: Right: Thick: Yes Discolored: No Deformed: No Improper Length and Hygiene: No Electronic Signature(s) Signed: 03/14/2018 11:50:24 AM By: Secundino Ginger Entered By: Secundino Ginger on 03/14/2018 10:26:24 [name] ([id number]) -------------------------------------------------------------------------------- Multi Wound Chart Details Patient Name:  [name] Date of Service: 03/14/2018 10:15 AM Medical Record Number: [id number] Patient Account Number: [id number] Date of Birth/Sex: 08-Dec-1924 (85 y.o. F) Treating RN: Montey Hora Primary Care Brysyn Brandenberger: Dion Body Other Clinician: Referring Aly Hauser: Dion Body Treating Aeon Koors/Extender: Melburn Hake, HOYT Weeks in Treatment: 2 Vital Signs Height(in): 62 Pulse(bpm): 42 Weight(lbs): 115 Blood Pressure(mmHg): 122/34 Body Mass Index(BMI): 21 Temperature(F): 97.6 Respiratory Rate 16 (breaths/min): Photos: [N/A:N/A] Wound Location: Left Lower Leg - Posterior Chest - Midline N/A Wounding Event: Trauma Surgical Injury N/A Primary Etiology: Trauma,  Other Open Surgical Wound N/A Comorbid History: Arrhythmia, Hypertension, Arrhythmia, Hypertension, N/A Gout, Osteoarthritis Gout, Osteoarthritis Date Acquired: 12/15/2017 02/28/2018 N/A Weeks of Treatment: 2 2 N/A Wound Status: Open Open N/A Measurements L x W x D 0.1x0.1x0.1 0.2x0.2x0.1 N/A (cm) Area (cm) : 0.008 0.031 N/A Volume (cm) : 0.001 0.003 N/A % Reduction in Area: 99.20% 95.60% N/A % Reduction in Volume: 98.90% 97.90% N/A Classification: Full Thickness Without Partial Thickness N/A Exposed Support Structures Exudate Amount: Small Small N/A Exudate Type: Serous Serous N/A Exudate Color: amber amber N/A Wound Margin: Flat and Intact Flat and Intact N/A Granulation Amount: None Present (0%) None Present (0%) N/A Necrotic Amount: None Present (0%) None Present (0%) N/A Exposed Structures: Fat Layer (Subcutaneous Fat Layer (Subcutaneous N/A Tissue) Exposed: Yes Tissue) Exposed: Yes Fascia: No Fascia: No Tendon: No Tendon: No Muscle: No Muscle: No Joint: No Joint: No Bone: No Bone: No Bogusz, Nayellie O. ([id number]) Epithelialization: None None N/A Periwound Skin Texture: Excoriation: No Excoriation: No N/A Induration: No Induration: No Callus: No Callus: No Crepitus: No Crepitus:  No Rash: No Rash: No Scarring: No Scarring: No Periwound Skin Moisture: Maceration: No Maceration: No N/A Dry/Scaly: No Dry/Scaly: No Periwound Skin Color: Hemosiderin Staining: Yes Atrophie Blanche: No N/A Atrophie Blanche: No Cyanosis: No Cyanosis: No Ecchymosis: No Ecchymosis: No Erythema: No Erythema: No Hemosiderin Staining: No Mottled: No Mottled: No Pallor: No Pallor: No Rubor: No Rubor: No Temperature: No Abnormality No Abnormality N/A Tenderness on Palpation: Yes Yes N/A Wound Preparation: Ulcer Cleansing: Ulcer Cleansing: N/A Rinsed/Irrigated with Saline Rinsed/Irrigated with Saline Topical Anesthetic Applied: None Treatment Notes Electronic Signature(s) Signed: 03/14/2018 5:32:05 PM By: Montey Hora Entered By: Montey Hora on 03/14/2018 10:54:04 [name] ([id number]) -------------------------------------------------------------------------------- [place] Details Patient Name: [name] Date of Service: 03/14/2018 10:15 AM Medical Record Number: [id number] Patient Account Number: [id number] Date of Birth/Sex: December 01, 1924 (85 y.o. F) Treating RN: Montey Hora Primary Care Javi Bollman: Dion Body Other Clinician: Referring Chris Cripps: Dion Body Treating Robina Hamor/Extender: Melburn Hake, HOYT Weeks in Treatment: 2 Active Inactive Wound/Skin Impairment Nursing Diagnoses: Impaired tissue integrity Goals: Ulcer/skin breakdown will have a volume reduction of 30% by week 4 Date Initiated: 02/28/2018 Target Resolution Date: 03/31/2018 Goal Status: Active Interventions: Assess patient/caregiver ability to obtain necessary supplies Assess patient/caregiver ability to perform ulcer/skin care regimen upon admission and as needed Assess ulceration(s) every visit Notes: Electronic Signature(s) Signed: 03/14/2018 5:32:05 PM By: Montey Hora Entered By: Montey Hora on 03/14/2018 10:53:57 [name]  ([id number]) -------------------------------------------------------------------------------- Pain Assessment Details Patient Name: [name] Date of Service: 03/14/2018 10:15 AM Medical Record Number: [id number] Patient Account Number: [id number] Date of Birth/Sex: 17-Dec-1924 (85 y.o. F) Treating RN: Montey Hora Primary Care Talayeh Bruinsma: Dion Body Other Clinician: Referring Breya Cass: Dion Body Treating Xiara Knisley/Extender: Melburn Hake, HOYT Weeks in Treatment: 2 Active Problems Location of Pain Severity and Description of Pain Patient Has Paino No Site Locations Pain Management and Medication Current Pain Management: Electronic Signature(s) Signed: 03/14/2018 3:49:07 PM By: Paulla Fore, RRT, CHT Signed: 03/14/2018 5:32:05 PM By: Montey Hora Entered By: Lorine Bears on 03/14/2018 10:08:52 [name] ([id number]) -------------------------------------------------------------------------------- Patient/Caregiver Education Details Patient Name: [name] Date of Service: 03/14/2018 10:15 AM Medical Record Number: [id number] Patient Account Number: [id number] Date of Birth/Gender: [birth year] (85 y.o. F) Treating RN: Montey Hora Primary Care Physician: Dion Body Other Clinician: Referring Physician: Dion Body Treating Physician/Extender: Sharalyn Ink in Treatment: 2 Education Assessment Education Provided To: Patient and Caregiver  Education Topics Provided Wound/Skin Impairment: Handouts: Other: wound care as ordered Methods: Demonstration, Explain/Verbal Responses: State content correctly Electronic Signature(s) Signed: 03/14/2018 5:32:05 PM By: Montey Hora Entered By: Montey Hora on 03/14/2018 10:57:29 [name] ([id number]) -------------------------------------------------------------------------------- Wound Assessment Details Patient Name: [name] Date of Service: 03/14/2018 10:15 AM Medical Record Number: [id number] Patient Account Number: [id number] Date of Birth/Sex: 1924-09-22 (85 y.o. F) Treating RN: Montey Hora Primary Care Jaterrius Ricketson: Dion Body Other Clinician: Referring Araiyah Cumpton: Dion Body Treating Celita Aron/Extender: Melburn Hake, HOYT Weeks in Treatment: 2 Wound Status Wound Number: 2 Primary Etiology: Trauma, Other Wound Location: Left, Posterior Lower Leg Wound Status: Healed - Epithelialized Wounding Event: Trauma Comorbid Arrhythmia, Hypertension, Gout, History: Osteoarthritis Date Acquired: 12/15/2017 Weeks Of Treatment: 2 Clustered Wound: No Photos Photo Uploaded By: Secundino Ginger on 03/14/2018 10:30:29 Wound Measurements Length: (cm) 0 % Reduc Width: (cm) 0 % Reduc Depth: (cm) 0 Epithel Area: (cm) 0 Tunnel Volume: (cm) 0 tion in Area: 100% tion in Volume: 100% ialization: None ing: No Wound Description Full Thickness Without Exposed Support Foul O Classification: Structures Slough Wound Margin: Flat and Intact Exudate Small Amount: Exudate Type: Serous Exudate Color: amber dor After Cleansing: No /Fibrino Yes Wound Bed Granulation Amount: None Present (0%) Exposed Structure Necrotic Amount: None Present (0%) Fascia Exposed: No Fat Layer (Subcutaneous Tissue) Exposed: Yes Tendon Exposed: No Muscle Exposed: No Joint Exposed: No Bone Exposed: No Down, Yissel O. ([id number]) Periwound Skin Texture Texture Color No Abnormalities Noted: No No Abnormalities Noted: No Callus: No Atrophie Blanche: No Crepitus: No Cyanosis: No Excoriation: No Ecchymosis: No Induration: No Erythema: No Rash: No Hemosiderin Staining: Yes Scarring: No Mottled: No Pallor: No Moisture Rubor: No No Abnormalities Noted: No Dry / Scaly: No Temperature / Pain Maceration: No Temperature: No Abnormality Tenderness on Palpation: Yes Wound Preparation Ulcer Cleansing: Rinsed/Irrigated  with Saline Electronic Signature(s) Signed: 03/14/2018 5:32:05 PM By: Montey Hora Entered By: Montey Hora on 03/14/2018 10:54:32 [name] ([id number]) -------------------------------------------------------------------------------- Wound Assessment Details Patient Name: [name] Date of Service: 03/14/2018 10:15 AM Medical Record Number: [id number] Patient Account Number: [id number] Date of Birth/Sex: [birth year] (85 y.o. F) Treating RN: Secundino Ginger Primary Care Porcha Deblanc: Dion Body Other Clinician: Referring Yarielis Funaro: Dion Body Treating Logon Uttech/Extender: Melburn Hake, HOYT Weeks in Treatment: 2 Wound Status Wound Number: 3 Primary Etiology: Open Surgical Wound Wound Location: Chest - Midline Wound Status: Open Wounding Event: Surgical Injury Comorbid Arrhythmia, Hypertension, Gout, History: Osteoarthritis Date Acquired: 02/28/2018 Weeks Of Treatment: 2 Clustered Wound: No Photos Photo Uploaded By: Secundino Ginger on 03/14/2018 10:30:30 Wound Measurements Length: (cm) 0.2 % Reduction Width: (cm) 0.2 % Reduction Depth: (cm) 0.1 Epitheliali Area: (cm) 0.031 Tunneling: Volume: (cm) 0.003 Underminin in Area: 95.6% in Volume: 97.9% zation: None No g: No Wound Description Classification: Partial Thickness Foul Odor Wound Margin: Flat and Intact Slough/Fib Exudate Amount: Small Exudate Type: Serous Exudate Color: amber After Cleansing: No rino No Wound Bed Granulation Amount: None Present (0%) Exposed Structure Necrotic Amount: None Present (0%) Fascia Exposed: No Fat Layer (Subcutaneous Tissue) Exposed: Yes Tendon Exposed: No Muscle Exposed: No Joint Exposed: No Bone Exposed: No Periwound Skin Texture Bolar, Kaylla O. ([id number]) Texture Color No Abnormalities Noted: No No Abnormalities Noted: No Callus: No Atrophie Blanche: No Crepitus: No Cyanosis: No Excoriation: No Ecchymosis: No Induration: No Erythema: No Rash:  No Hemosiderin Staining: No Scarring: No Mottled: No Pallor: No Moisture Rubor: No No Abnormalities Noted: No Dry / Scaly: No Temperature / Pain Maceration: No Temperature:  No Abnormality Tenderness on Palpation: Yes Wound Preparation Ulcer Cleansing: Rinsed/Irrigated with Saline Topical Anesthetic Applied: None Treatment Notes Wound #3 (Midline Chest) Notes prisma and coverlet Electronic Signature(s) Signed: 03/14/2018 11:50:24 AM By: Secundino Ginger Entered By: Secundino Ginger on 03/14/2018 10:24:24 [name] ([id number]) -------------------------------------------------------------------------------- [place] Details Patient Name: [name] Date of Service: 03/14/2018 10:15 AM Medical Record Number: [id number] Patient Account Number: [id number] Date of Birth/Sex: December 10, 1924 (85 y.o. F) Treating RN: Montey Hora Primary Care Dan Dissinger: Dion Body Other Clinician: Referring Virda Betters: Dion Body Treating Caroll Cunnington/Extender: Melburn Hake, HOYT Weeks in Treatment: 2 Vital Signs Time Taken: 10:08 Temperature (F): 97.6 Height (in): 62 Pulse (bpm): 42 Weight (lbs): 115 Respiratory Rate (breaths/min): 16 Body Mass Index (BMI): 21 Blood Pressure (mmHg): 122/34 Reference Range: 80 - 120 mg / dl Electronic Signature(s) Signed: 03/14/2018 3:49:07 PM By: Lorine Bears RCP, RRT, CHT Entered By: Lorine Bears on 03/14/2018 10:14:53

## 2018-03-21 NOTE — Progress Notes (Signed)
MILANNA, KOZLOV ([id number]) Visit Report for 03/14/2018 Chief Complaint Document Details Patient Name: Joanne Michael, Joanne Michael Date of Service: 03/14/2018 10:15 AM Medical Record Number: [id number] Patient Account Number: [id number] Date of Birth/Sex: 20-Oct-1924 (85 y.o. F) Treating RN: Montey Hora Primary Care Provider: Dion Body Other Clinician: Referring Provider: Dion Body Treating Provider/Extender: Melburn Hake, Jalaine Riggenbach Weeks in Treatment: 2 Information Obtained from: Patient Chief Complaint Left LE ulcer due to trauma, chest wall skin lesion, and heel pain bilateral Electronic Signature(s) Signed: 03/20/2018 9:23:56 AM By: Worthy Keeler PA-C Entered By: Worthy Keeler on 03/14/2018 10:36:02 Joanne Michael ([id number]) -------------------------------------------------------------------------------- HPI Details Patient Name: Joanne Michael Date of Service: 03/14/2018 10:15 AM Medical Record Number: [id number] Patient Account Number: [id number] Date of Birth/Sex: 07/15/1924 (85 y.o. F) Treating RN: Montey Hora Primary Care Provider: Dion Body Other Clinician: Referring Provider: Dion Body Treating Provider/Extender: Melburn Hake, Serenna Deroy Weeks in Treatment: 2 History of Present Illness Location: Patient presents with an ulcer on the left lower extremity in the anterior lateral calf area Quality: Patient reports experiencing a throbbing pain to affected area intermittently Severity: Patient states wound are getting better Duration: Patient has had the wound for > 2 months prior to seeking treatment at the wound center Timing: Pain in wound is Intermittent (comes and goes Context: The wound occurred when the patient had a blunt trauma to the legs and had a large swelling Modifying Factors: Other treatment(s) tried include:local care with antibiotic ointment Associated Signs and Symptoms: Patient reports having increase swelling. HPI Description:  85 year old patient referred to [place] by Ardyth Man, PA, for a ulcer on the left lower extremity, with cellulitis caused by trauma 2 months ago. past medical history of atrial fibrillation, chronic renal failure stage II, essential hypertension, and deficiency anemia, osteoporosis and spinal stenosis. She is status post appendectomy, gallbladder surgery, hernia repair, hysterectomy, knee replacement. She was recently placed on doxycycline. 08/06/2016 -- she has not been using her lymphedema pumps and I have urged her to do this. She will be going off to the beach for a week and will see as the week after 08/20/16 on evaluation today patient's wound of the left lower extremity appears to be doing very well. She is been tolerating the dressing changes without complication and she is pleased with how this is progressing. There is no evidence of infection. 09/03/16 on evaluation today patient appears to be doing well in regard to her wound. Unfortunately she missed her appointment last week because she was actually the hospital due to pneumonia. She has done well in recovery following that time and they took care for wounded very well for her during the time that she was in the hospital. She has no other worsening issues and no evidence of infection. 09/10/16 Patient's wound on the left lower extremity appears to be doing significantly better on evaluation today. There is some slight hyper granular tissue but overall this is showing good signs of improvement which is good news. She is pleased with how this is progressing. Readmission: 02/28/18 on evaluation today patient presents for multiple complaints for which she would like evaluation at this point. She has a history of atrial fibrillation, lymphedema, and currently has a wound on the left lower extremity that appears to be secondary to lymphedema. She's also having issues with her bilateral heels which are hurting her at this point although she does  not have any open wounds or signs of deep tissue injury at the sites. She states that  when she lies in bed on her back that's when it typically hurts but again I see no evidence of pressure injury. It sounds to me that she may be experiencing some wrap up the in this regard. Lastly she has an area on her central chest where there is a lesion that is beginning to become painful to her to touch. She states this came up over the past several months although she doesn't know exactly when she first saw this. There is no sign of fluid underneath this area and it does not appear to be an abscess at all as best I could tell. Nonetheless it does have me somewhat concerned about the possibility of a skin cancer just based on the appearance today. No fevers, chills, nausea, or vomiting noted at this time. 03/07/18 on evaluation today patient actually appears to be doing decently well all things considering in regard to her ulcers. Overall in regard to the lower extremity ulcer she's made good improvement. In regard to the chest ulceration which is a result of the biopsy it appears that she is doing better. We did get the report back which seems to indicate that she may have clear margins although I do not know that for sure based on the fact that there is some conflicting information in the first paragraph stating that this appear to be a superficial portion of a squamous cell carcinoma. Nonetheless it goes on to state that the [facility]. ([id number]) section through the middle indicates that there are clear margins in regard to what was removed. Nonetheless I did contact the pathologist who was going to apply an addendum to the report to clarify after looking back at the slides. I will contact the patient as soon as I have that information. We will make additional follow-ups as necessary based on the results. 03/14/18 on evaluation today patient appears to be doing excellent in effect her leg is  completely healed. The wound on her chest is almost completely healed and fortunately we got news back on the revised study from her biopsy which the pathology report now shows that the lesion that was removed had clear margins. Overall I'm very pleased about this. The patient is extremely pleased. Electronic Signature(s) Signed: 03/20/2018 9:23:56 AM By: Worthy Keeler PA-C Entered By: Worthy Keeler on 03/20/2018 09:02:38 Joanne Michael ([id number]) -------------------------------------------------------------------------------- Physical Exam Details Patient Name: Joanne Michael Date of Service: 03/14/2018 10:15 AM Medical Record Number: [id number] Patient Account Number: [id number] Date of Birth/Sex: Feb 15, 1925 (85 y.o. F) Treating RN: Montey Hora Primary Care Provider: Dion Body Other Clinician: Referring Provider: Dion Body Treating Provider/Extender: Melburn Hake, Dailen Mcclish Weeks in Treatment: 2 Constitutional Well-nourished and well-hydrated in no acute distress. Respiratory normal breathing without difficulty. clear to auscultation bilaterally. Cardiovascular regular rate and rhythm with normal S1, S2. Psychiatric this patient is able to make decisions and demonstrates good insight into disease process. Alert and Oriented x 3. pleasant and cooperative. Notes No sharp debridement was required at this time and the patient seems to be doing excellent summary gonna continue down that road at this point. She's in agreement that plan. If anything changes or worsens she will let me know. Electronic Signature(s) Signed: 03/20/2018 9:23:56 AM By: Worthy Keeler PA-C Entered By: Worthy Keeler on 03/20/2018 09:03:02 Gwyn Hieronymus Michael ([id number]) -------------------------------------------------------------------------------- Physician Orders Details Patient Name: Joanne Michael Date of Service: 03/14/2018 10:15 AM Medical Record Number: [id number] Patient  Account Number: [id number] Date of Birth/Sex: 15-Sep-1924 (  85 y.o. F) Treating RN: Montey Hora Primary Care Provider: Dion Body Other Clinician: Referring Provider: Dion Body Treating Provider/Extender: Melburn Hake, Reyana Leisey Weeks in Treatment: 2 Verbal / Phone Orders: No Diagnosis Coding ICD-10 Coding Code Description I89.0 Lymphedema, not elsewhere classified L97.822 Non-pressure chronic ulcer of other part of left lower leg with fat layer exposed L98.9 Disorder of the skin and subcutaneous tissue, unspecified I48.0 Paroxysmal atrial fibrillation Wound Cleansing Wound #3 Midline Chest o May Shower, gently pat wound dry prior to applying new dressing. Primary Wound Dressing Wound #3 Midline Chest o Silver Collagen Secondary Dressing Wound #3 Midline Chest o Other - coverlet or bandaid Dressing Change Frequency Wound #3 Midline Chest o Change dressing every other day. Follow-up Appointments Wound #3 Midline Chest o Return Appointment in 2 weeks. Electronic Signature(s) Signed: 03/14/2018 5:32:05 PM By: Montey Hora Signed: 03/20/2018 9:23:56 AM By: Worthy Keeler PA-C Entered By: Montey Hora on 03/14/2018 10:55:01 Joanne Michael ([id number]) -------------------------------------------------------------------------------- Problem List Details Patient Name: Tenae Graziosi Michael Date of Service: 03/14/2018 10:15 AM Medical Record Number: [id number] Patient Account Number: [id number] Date of Birth/Sex: 19-Nov-1924 (85 y.o. F) Treating RN: Montey Hora Primary Care Provider: Dion Body Other Clinician: Referring Provider: Dion Body Treating Provider/Extender: Melburn Hake, Bralon Antkowiak Weeks in Treatment: 2 Active Problems ICD-10 Evaluated Encounter Code Description Active Date Today Diagnosis I89.0 Lymphedema, not elsewhere classified 02/28/2018 No Yes L97.822 Non-pressure chronic ulcer of other part of left lower leg with 02/28/2018 No  Yes fat layer exposed L98.9 Disorder of the skin and subcutaneous tissue, unspecified 02/28/2018 No Yes I48.0 Paroxysmal atrial fibrillation 02/28/2018 No Yes Inactive Problems Resolved Problems Electronic Signature(s) Signed: 03/20/2018 9:23:56 AM By: Worthy Keeler PA-C Entered By: Worthy Keeler on 03/14/2018 10:35:56 Joanne Michael ([id number]) -------------------------------------------------------------------------------- Progress Note Details Patient Name: Joanne Michael Date of Service: 03/14/2018 10:15 AM Medical Record Number: [id number] Patient Account Number: [id number] Date of Birth/Sex: 11-Apr-1924 (85 y.o. F) Treating RN: Montey Hora Primary Care Provider: Dion Body Other Clinician: Referring Provider: Dion Body Treating Provider/Extender: Melburn Hake, Forest Redwine Weeks in Treatment: 2 Subjective Chief Complaint Information obtained from Patient Left LE ulcer due to trauma, chest wall skin lesion, and heel pain bilateral History of Present Illness (HPI) The following HPI elements were documented for the patient's wound: Location: Patient presents with an ulcer on the left lower extremity in the anterior lateral calf area Quality: Patient reports experiencing a throbbing pain to affected area intermittently Severity: Patient states wound are getting better Duration: Patient has had the wound for > 2 months prior to seeking treatment at the wound center Timing: Pain in wound is Intermittent (comes and goes Context: The wound occurred when the patient had a blunt trauma to the legs and had a large swelling Modifying Factors: Other treatment(s) tried include:local care with antibiotic ointment Associated Signs and Symptoms: Patient reports having increase swelling. 85 year old patient referred to [place] by Ardyth Man, PA, for a ulcer on the left lower extremity, with cellulitis caused by trauma 2 months ago. past medical history of atrial fibrillation,  chronic renal failure stage II, essential hypertension, and deficiency anemia, osteoporosis and spinal stenosis. She is status post appendectomy, gallbladder surgery, hernia repair, hysterectomy, knee replacement. She was recently placed on doxycycline. 08/06/2016 -- she has not been using her lymphedema pumps and I have urged her to do this. She will be going off to the beach for a week and will see as the week after 08/20/16 on evaluation today patient's wound of  the left lower extremity appears to be doing very well. She is been tolerating the dressing changes without complication and she is pleased with how this is progressing. There is no evidence of infection. 09/03/16 on evaluation today patient appears to be doing well in regard to her wound. Unfortunately she missed her appointment last week because she was actually the hospital due to pneumonia. She has done well in recovery following that time and they took care for wounded very well for her during the time that she was in the hospital. She has no other worsening issues and no evidence of infection. 09/10/16 Patient's wound on the left lower extremity appears to be doing significantly better on evaluation today. There is some slight hyper granular tissue but overall this is showing good signs of improvement which is good news. She is pleased with how this is progressing. Readmission: 02/28/18 on evaluation today patient presents for multiple complaints for which she would like evaluation at this point. She has a history of atrial fibrillation, lymphedema, and currently has a wound on the left lower extremity that appears to be secondary to lymphedema. She's also having issues with her bilateral heels which are hurting her at this point although she does not have any open wounds or signs of deep tissue injury at the sites. She states that when she lies in bed on her back that's when it typically hurts but again I see no evidence of pressure  injury. It sounds to me that she may be experiencing some wrap up the in this regard. Lastly she has an area on her central chest where there is a lesion that is beginning to become painful to her to touch. She states this came up over the past several months although she doesn't know exactly when she first saw this. There is no sign of fluid underneath this area and it does not appear to be an abscess at all as best I could tell. Nonetheless it Joanne Michael, Joanne Michael ([id number]) does have me somewhat concerned about the possibility of a skin cancer just based on the appearance today. No fevers, chills, nausea, or vomiting noted at this time. 03/07/18 on evaluation today patient actually appears to be doing decently well all things considering in regard to her ulcers. Overall in regard to the lower extremity ulcer she's made good improvement. In regard to the chest ulceration which is a result of the biopsy it appears that she is doing better. We did get the report back which seems to indicate that she may have clear margins although I do not know that for sure based on the fact that there is some conflicting information in the first paragraph stating that this appear to be a superficial portion of a squamous cell carcinoma. Nonetheless it goes on to state that the section through the middle indicates that there are clear margins in regard to what was removed. Nonetheless I did contact the pathologist who was going to apply an addendum to the report to clarify after looking back at the slides. I will contact the patient as soon as I have that information. We will make additional follow-ups as necessary based on the results. 03/14/18 on evaluation today patient appears to be doing excellent in effect her leg is completely healed. The wound on her chest is almost completely healed and fortunately we got news back on the revised study from her biopsy which the pathology report now shows that the lesion that  was removed had clear margins.  Overall I'm very pleased about this. The patient is extremely pleased. Patient History Information obtained from Patient. Family History Cancer - Siblings,Mother, Lung Disease - Siblings, Stroke - Siblings, No family history of Hypertension, Kidney Disease, Thyroid Problems, Tuberculosis. Social History Never smoker, Marital Status - Widowed, Alcohol Use - Never, Drug Use - No History, Caffeine Use - Daily. Medical And Surgical History Notes Respiratory mycobacterial pulmonary disease Cardiovascular varicose veins Review of Systems (ROS) Constitutional Symptoms (General Health) Denies complaints or symptoms of Fever, Chills. Respiratory The patient has no complaints or symptoms. Cardiovascular The patient has no complaints or symptoms. Psychiatric The patient has no complaints or symptoms. Objective Constitutional Well-nourished and well-hydrated in no acute distress. Vitals Time Taken: 10:08 AM, Height: 62 in, Weight: 115 lbs, BMI: 21, Temperature: 97.6 F, Pulse: 42 bpm, Respiratory Rate: 16 breaths/min, Blood Pressure: 122/34 mmHg. Joanne Michael, Joanne Michael ([id number]) Respiratory normal breathing without difficulty. clear to auscultation bilaterally. Cardiovascular regular rate and rhythm with normal S1, S2. Psychiatric this patient is able to make decisions and demonstrates good insight into disease process. Alert and Oriented x 3. pleasant and cooperative. General Notes: No sharp debridement was required at this time and the patient seems to be doing excellent summary gonna continue down that road at this point. She's in agreement that plan. If anything changes or worsens she will let me know. Integumentary (Hair, Skin) Wound #2 status is Healed - Epithelialized. Original cause of wound was Trauma. The wound is located on the Left,Posterior Lower Leg. The wound measures 0cm length x 0cm width x 0cm depth; 0cm^2 area and 0cm^3 volume. There is Fat  Layer (Subcutaneous Tissue) Exposed exposed. There is no tunneling noted. There is a small amount of serous drainage noted. The wound margin is flat and intact. There is no granulation within the wound bed. There is no necrotic tissue within the wound bed. The periwound skin appearance exhibited: Hemosiderin Staining. The periwound skin appearance did not exhibit: Callus, Crepitus, Excoriation, Induration, Rash, Scarring, Dry/Scaly, Maceration, Atrophie Blanche, Cyanosis, Ecchymosis, Mottled, Pallor, Rubor, Erythema. Periwound temperature was noted as No Abnormality. The periwound has tenderness on palpation. Wound #3 status is Open. Original cause of wound was Surgical Injury. The wound is located on the Midline Chest. The wound measures 0.2cm length x 0.2cm width x 0.1cm depth; 0.031cm^2 area and 0.003cm^3 volume. There is Fat Layer (Subcutaneous Tissue) Exposed exposed. There is no tunneling or undermining noted. There is a small amount of serous drainage noted. The wound margin is flat and intact. There is no granulation within the wound bed. There is no necrotic tissue within the wound bed. The periwound skin appearance did not exhibit: Callus, Crepitus, Excoriation, Induration, Rash, Scarring, Dry/Scaly, Maceration, Atrophie Blanche, Cyanosis, Ecchymosis, Hemosiderin Staining, Mottled, Pallor, Rubor, Erythema. Periwound temperature was noted as No Abnormality. The periwound has tenderness on palpation. Assessment Active Problems ICD-10 Lymphedema, not elsewhere classified Non-pressure chronic ulcer of other part of left lower leg with fat layer exposed Disorder of the skin and subcutaneous tissue, unspecified Paroxysmal atrial fibrillation Plan Wound Cleansing: Wound #3 Midline Chest: May Shower, gently pat wound dry prior to applying new dressing. Primary Wound Dressing: Joanne Michael, Joanne Michael ([id number]) Wound #3 Midline Chest: Silver Collagen Secondary Dressing: Wound #3 Midline  Chest: Other - coverlet or bandaid Dressing Change Frequency: Wound #3 Midline Chest: Change dressing every other day. Follow-up Appointments: Wound #3 Midline Chest: Return Appointment in 2 weeks. I will see her back for reevaluation one weeks time I which point I  hope she will be completely healed. Please see above for specific wound care orders. We will see patient for re-evaluation in 1 week(s) here in the clinic. If anything worsens or changes patient will contact our office for additional recommendations. Electronic Signature(s) Signed: 03/20/2018 9:23:56 AM By: Worthy Keeler PA-C Entered By: Worthy Keeler on 03/20/2018 09:03:11 Joanne Michael ([id number]) -------------------------------------------------------------------------------- ROS/PFSH Details Patient Name: Joanne Michael Date of Service: 03/14/2018 10:15 AM Medical Record Number: [id number] Patient Account Number: [id number] Date of Birth/Sex: November 20, 1924 (85 y.o. F) Treating RN: Montey Hora Primary Care Provider: Dion Body Other Clinician: Referring Provider: Dion Body Treating Provider/Extender: Melburn Hake, Tiffancy Moger Weeks in Treatment: 2 Information Obtained From Patient Wound History Do you currently have one or more open woundso No Have you tested positive for osteomyelitis (bone infection)o No Have you had any tests for circulation on your legso No Constitutional Symptoms (General Health) Complaints and Symptoms: Negative for: Fever; Chills Eyes Medical History: Negative for: Cataracts; Glaucoma; Optic Neuritis Ear/Nose/Mouth/Throat Medical History: Negative for: Chronic sinus problems/congestion; Middle ear problems Hematologic/Lymphatic Medical History: Negative for: Anemia; Hemophilia; Human Immunodeficiency Virus; Lymphedema; Sickle Cell Disease Respiratory Complaints and Symptoms: No Complaints or Symptoms Medical History: Negative for: Aspiration; Asthma; Chronic  Obstructive Pulmonary Disease (COPD); Pneumothorax; Sleep Apnea; Tuberculosis Past Medical History Notes: mycobacterial pulmonary disease Cardiovascular Complaints and Symptoms: No Complaints or Symptoms Medical History: Positive for: Arrhythmia - a fib; Hypertension Negative for: Angina; Congestive Heart Failure; Coronary Artery Disease; Deep Vein Thrombosis; Hypotension; Myocardial Infarction; Peripheral Arterial Disease; Peripheral Venous Disease; Phlebitis; Vasculitis Past Medical History Notes: varicose veins LYLE, LEISNER ([id number]) Gastrointestinal Medical History: Negative for: Cirrhosis ; Colitis; Crohnos; Hepatitis A; Hepatitis B; Hepatitis C Endocrine Medical History: Negative for: Type I Diabetes; Type II Diabetes Immunological Medical History: Negative for: Lupus Erythematosus; Raynaudos; Scleroderma Integumentary (Skin) Medical History: Negative for: History of Burn; History of pressure wounds Musculoskeletal Medical History: Positive for: Gout; Osteoarthritis Negative for: Rheumatoid Arthritis; Osteomyelitis Neurologic Medical History: Negative for: Dementia; Neuropathy; Quadriplegia; Paraplegia; Seizure Disorder Oncologic Medical History: Negative for: Received Chemotherapy; Received Radiation Psychiatric Complaints and Symptoms: No Complaints or Symptoms Medical History: Negative for: Anorexia/bulimia; Confinement Anxiety Immunizations Pneumococcal Vaccine: Received Pneumococcal Vaccination: Yes Immunization Notes: up to date Implantable Devices Family and Social History Cancer: Yes - Siblings,Mother; Hypertension: No; Kidney Disease: No; Lung Disease: Yes - Siblings; Stroke: Yes - Siblings; Thyroid Problems: No; Tuberculosis: No; Never smoker; Marital Status - Widowed; Alcohol Use: Never; Drug Use: No History; Caffeine Use: Daily; Financial Concerns: No; Food, Clothing or Shelter Needs: No; Support System Lacking: No; Transportation Concerns:  No; Advanced Directives: No; Patient does not want information on Advanced Directives; Do not resuscitate: No; Living Will: Yes (Not Provided); Medical Power of Attorney: Yes - daughter Pablo Ledger (Not Provided) ALYXANDRA, TENBRINK ([id number]) Physician Affirmation I have reviewed and agree with the above information. Electronic Signature(s) Signed: 03/20/2018 9:23:56 AM By: Worthy Keeler PA-C Signed: 03/20/2018 5:09:04 PM By: Montey Hora Entered By: Worthy Keeler on 03/20/2018 09:02:51 Rogan Ecklund Michael ([id number]) -------------------------------------------------------------------------------- SuperBill Details Patient Name: Staria Birkhead Michael Date of Service: 03/14/2018 Medical Record Number: [id number] Patient Account Number: [id number] Date of Birth/Sex: 06/10/1924 (85 y.o. F) Treating RN: Montey Hora Primary Care Provider: Dion Body Other Clinician: Referring Provider: Dion Body Treating Provider/Extender: Melburn Hake, Taresa Montville Weeks in Treatment: 2 Diagnosis Coding ICD-10 Codes Code Description I89.0 Lymphedema, not elsewhere classified L97.822 Non-pressure chronic ulcer of other part of left lower leg with fat layer exposed  L98.9 Disorder of the skin and subcutaneous tissue, unspecified I48.0 Paroxysmal atrial fibrillation Facility Procedures CPT4 Code: [id number] Description: [id number] - WOUND CARE VISIT-LEV 3 EST PT Modifier: Quantity: 1 Physician Procedures CPT4 Code Description: [id number] - WC PHYS LEVEL 4 - EST PT ICD-10 Diagnosis Description I89.0 Lymphedema, not elsewhere classified L97.822 Non-pressure chronic ulcer of other part of left lower leg wit L98.9 Disorder of the skin and subcutaneous  tissue, unspecified I48.0 Paroxysmal atrial fibrillation Modifier: h fat layer expos Quantity: 1 ed Electronic Signature(s) Signed: 03/17/2018 9:58:15 PM By: Worthy Keeler PA-C Entered By: Worthy Keeler on 03/17/2018 21:51:51

## 2018-03-28 ENCOUNTER — Encounter: Payer: Medicare Other | Admitting: Physician Assistant

## 2018-03-28 DIAGNOSIS — E11622 Type 2 diabetes mellitus with other skin ulcer: Secondary | ICD-10-CM | POA: Diagnosis not present

## 2018-03-30 NOTE — Progress Notes (Signed)
SHELLBY, SCHLINK ([id number]) Visit Report for 03/28/2018 Chief Complaint Document Details Patient Name: Joanne Michael, Joanne Michael. Date of Service: 03/28/2018 9:30 AM Medical Record Number: [id number] Patient Account Number: [id number] Date of Birth/Sex: March 15, 1924 (85 y.o. F) Treating RN: Montey Hora Primary Care Provider: Dion Body Other Clinician: Referring Provider: Dion Body Treating Provider/Extender: Melburn Hake, Therasa Lorenzi Weeks in Treatment: 4 Information Obtained from: Patient Chief Complaint Left LE ulcer due to trauma, chest wall skin lesion, and heel pain bilateral Electronic Signature(s) Signed: 03/28/2018 5:12:31 PM By: Worthy Keeler PA-C Entered By: Worthy Keeler on 03/28/2018 09:35:55 Sharone Almond Michael ([id number]) -------------------------------------------------------------------------------- HPI Details Patient Name: Joanne Michael Date of Service: 03/28/2018 9:30 AM Medical Record Number: [id number] Patient Account Number: [id number] Date of Birth/Sex: June 23, 1924 (85 y.o. F) Treating RN: Montey Hora Primary Care Provider: Dion Body Other Clinician: Referring Provider: Dion Body Treating Provider/Extender: Melburn Hake, Deisy Ozbun Weeks in Treatment: 4 History of Present Illness Location: Patient presents with an ulcer on the left lower extremity in the anterior lateral calf area Quality: Patient reports experiencing a throbbing pain to affected area intermittently Severity: Patient states wound are getting better Duration: Patient has had the wound for > 2 months prior to seeking treatment at the wound center Timing: Pain in wound is Intermittent (comes and goes Context: The wound occurred when the patient had a blunt trauma to the legs and had a large swelling Modifying Factors: Other treatment(s) tried include:local care with antibiotic ointment Associated Signs and Symptoms: Patient reports having increase swelling. HPI Description:  84 year old patient referred to [place] by Ardyth Man, PA, for a ulcer on the left lower extremity, with cellulitis caused by trauma 2 months ago. past medical history of atrial fibrillation, chronic renal failure stage II, essential hypertension, and deficiency anemia, osteoporosis and spinal stenosis. She is status post appendectomy, gallbladder surgery, hernia repair, hysterectomy, knee replacement. She was recently placed on doxycycline. 08/06/2016 -- she has not been using her lymphedema pumps and I have urged her to do this. She will be going off to the beach for a week and will see as the week after 08/20/16 on evaluation today patient's wound of the left lower extremity appears to be doing very well. She is been tolerating the dressing changes without complication and she is pleased with how this is progressing. There is no evidence of infection. 09/03/16 on evaluation today patient appears to be doing well in regard to her wound. Unfortunately she missed her appointment last week because she was actually the hospital due to pneumonia. She has done well in recovery following that time and they took care for wounded very well for her during the time that she was in the hospital. She has no other worsening issues and no evidence of infection. 09/10/16 Patient's wound on the left lower extremity appears to be doing significantly better on evaluation today. There is some slight hyper granular tissue but overall this is showing good signs of improvement which is good news. She is pleased with how this is progressing. Readmission: 02/28/18 on evaluation today patient presents for multiple complaints for which she would like evaluation at this point. She has a history of atrial fibrillation, lymphedema, and currently has a wound on the left lower extremity that appears to be secondary to lymphedema. She's also having issues with her bilateral heels which are hurting her at this point although she does  not have any open wounds or signs of deep tissue injury at the sites. She states that  when she lies in bed on her back that's when it typically hurts but again I see no evidence of pressure injury. It sounds to me that she may be experiencing some wrap up the in this regard. Lastly she has an area on her central chest where there is a lesion that is beginning to become painful to her to touch. She states this came up over the past several months although she doesn't know exactly when she first saw this. There is no sign of fluid underneath this area and it does not appear to be an abscess at all as best I could tell. Nonetheless it does have me somewhat concerned about the possibility of a skin cancer just based on the appearance today. No fevers, chills, nausea, or vomiting noted at this time. 03/07/18 on evaluation today patient actually appears to be doing decently well all things considering in regard to her ulcers. Overall in regard to the lower extremity ulcer she's made good improvement. In regard to the chest ulceration which is a result of the biopsy it appears that she is doing better. We did get the report back which seems to indicate that she may have clear margins although I do not know that for sure based on the fact that there is some conflicting information in the first paragraph stating that this appear to be a superficial portion of a squamous cell carcinoma. Nonetheless it goes on to state that the [facility] ([id number]) section through the middle indicates that there are clear margins in regard to what was removed. Nonetheless I did contact the pathologist who was going to apply an addendum to the report to clarify after looking back at the slides. I will contact the patient as soon as I have that information. We will make additional follow-ups as necessary based on the results. 03/14/18 on evaluation today patient appears to be doing excellent in effect her leg is  completely healed. The wound on her chest is almost completely healed and fortunately we got news back on the revised study from her biopsy which the pathology report now shows that the lesion that was removed had clear margins. Overall I'm very pleased about this. The patient is extremely pleased. 03/28/18 on evaluation today patient appears to be doing rather well in regard to her chest ulcer. This in fact appears to be completely healed which is excellent news. Fortunately there is no signs of infection at this time. Electronic Signature(s) Signed: 03/28/2018 5:12:31 PM By: Worthy Keeler PA-C Entered By: Worthy Keeler on 03/28/2018 10:12:59 Brandelyn Henne Michael ([id number]) -------------------------------------------------------------------------------- Physical Exam Details Patient Name: Emy Angevine Michael Date of Service: 03/28/2018 9:30 AM Medical Record Number: [id number] Patient Account Number: [id number] Date of Birth/Sex: [birth year] (85 y.o. F) Treating RN: Montey Hora Primary Care Provider: Dion Body Other Clinician: Referring Provider: Dion Body Treating Provider/Extender: Melburn Hake, Nameer Summer Weeks in Treatment: 4 Constitutional Well-nourished and well-hydrated in no acute distress. Respiratory normal breathing without difficulty. clear to auscultation bilaterally. Cardiovascular regular rate and rhythm with normal S1, S2. Psychiatric this patient is able to make decisions and demonstrates good insight into disease process. Alert and Oriented x 3. pleasant and cooperative. Notes Upon evaluation and inspection at this point patient states that she is had no other issues other than having some neck, left arm, and chest pain that she states happens in regard to when she is laying down and seems to emanate from her neck. With that being said she has not  really have this checked out advised her that she's to contact her cardiologist ASAP in this regard in order  to have that further evaluated. Based on the history of what she is telling me it sounds like this may be more of a nerve issue emanating from her neck but with her cardiac history I would want her to be completely sure. She understands. Electronic Signature(s) Signed: 03/28/2018 5:12:31 PM By: Worthy Keeler PA-C Entered By: Worthy Keeler on 03/28/2018 10:13:51 Gwen Sarvis Michael ([id number]) -------------------------------------------------------------------------------- Physician Orders Details Patient Name: Laurynn Mccorvey Michael Date of Service: 03/28/2018 9:30 AM Medical Record Number: [id number] Patient Account Number: [id number] Date of Birth/Sex: [birth year] (85 y.o. F) Treating RN: Montey Hora Primary Care Provider: Dion Body Other Clinician: Referring Provider: Dion Body Treating Provider/Extender: Melburn Hake, Lashaunda Schild Weeks in Treatment: 4 Verbal / Phone Orders: No Diagnosis Coding ICD-10 Coding Code Description I89.0 Lymphedema, not elsewhere classified L97.822 Non-pressure chronic ulcer of other part of left lower leg with fat layer exposed L98.9 Disorder of the skin and subcutaneous tissue, unspecified I48.0 Paroxysmal atrial fibrillation Discharge From [facility] o Discharge from [place] Signature(s) Signed: 03/28/2018 5:12:31 PM By: Worthy Keeler PA-C Signed: 03/28/2018 5:16:27 PM By: Montey Hora Entered By: Montey Hora on 03/28/2018 10:08:27 Mathilda Maguire Michael ([id number]) -------------------------------------------------------------------------------- Problem List Details Patient Name: Henri Guedes Michael Date of Service: 03/28/2018 9:30 AM Medical Record Number: [id number] Patient Account Number: [id number] Date of Birth/Sex: 07-26-1924 (85 y.o. F) Treating RN: Montey Hora Primary Care Provider: Dion Body Other Clinician: Referring Provider: Dion Body Treating Provider/Extender: Melburn Hake, Maheen Cwikla Weeks  in Treatment: 4 Active Problems ICD-10 Evaluated Encounter Code Description Active Date Today Diagnosis I89.0 Lymphedema, not elsewhere classified 02/28/2018 No Yes L97.822 Non-pressure chronic ulcer of other part of left lower leg with 02/28/2018 No Yes fat layer exposed L98.9 Disorder of the skin and subcutaneous tissue, unspecified 02/28/2018 No Yes I48.0 Paroxysmal atrial fibrillation 02/28/2018 No Yes Inactive Problems Resolved Problems Electronic Signature(s) Signed: 03/28/2018 5:12:31 PM By: Worthy Keeler PA-C Entered By: Worthy Keeler on 03/28/2018 09:35:51 Chico Cawood Michael ([id number]) -------------------------------------------------------------------------------- Progress Note Details Patient Name: Roena Sassaman Michael Date of Service: 03/28/2018 9:30 AM Medical Record Number: [id number] Patient Account Number: [id number] Date of Birth/Sex: September 29, 1924 (85 y.o. F) Treating RN: Montey Hora Primary Care Provider: Dion Body Other Clinician: Referring Provider: Dion Body Treating Provider/Extender: Melburn Hake, Kasaundra Fahrney Weeks in Treatment: 4 Subjective Chief Complaint Information obtained from Patient Left LE ulcer due to trauma, chest wall skin lesion, and heel pain bilateral History of Present Illness (HPI) The following HPI elements were documented for the patient's wound: Location: Patient presents with an ulcer on the left lower extremity in the anterior lateral calf area Quality: Patient reports experiencing a throbbing pain to affected area intermittently Severity: Patient states wound are getting better Duration: Patient has had the wound for > 2 months prior to seeking treatment at the wound center Timing: Pain in wound is Intermittent (comes and goes Context: The wound occurred when the patient had a blunt trauma to the legs and had a large swelling Modifying Factors: Other treatment(s) tried include:local care with antibiotic ointment Associated Signs  and Symptoms: Patient reports having increase swelling. 85 year old patient referred to [place] by Ardyth Man, PA, for a ulcer on the left lower extremity, with cellulitis caused by trauma 2 months ago. past medical history of atrial fibrillation, chronic renal failure stage II, essential hypertension, and deficiency anemia, osteoporosis and spinal stenosis. She  is status post appendectomy, gallbladder surgery, hernia repair, hysterectomy, knee replacement. She was recently placed on doxycycline. 08/06/2016 -- she has not been using her lymphedema pumps and I have urged her to do this. She will be going off to the beach for a week and will see as the week after 08/20/16 on evaluation today patient's wound of the left lower extremity appears to be doing very well. She is been tolerating the dressing changes without complication and she is pleased with how this is progressing. There is no evidence of infection. 09/03/16 on evaluation today patient appears to be doing well in regard to her wound. Unfortunately she missed her appointment last week because she was actually the hospital due to pneumonia. She has done well in recovery following that time and they took care for wounded very well for her during the time that she was in the hospital. She has no other worsening issues and no evidence of infection. 09/10/16 Patient's wound on the left lower extremity appears to be doing significantly better on evaluation today. There is some slight hyper granular tissue but overall this is showing good signs of improvement which is good news. She is pleased with how this is progressing. Readmission: 02/28/18 on evaluation today patient presents for multiple complaints for which she would like evaluation at this point. She has a history of atrial fibrillation, lymphedema, and currently has a wound on the left lower extremity that appears to be secondary to lymphedema. She's also having issues with her bilateral heels  which are hurting her at this point although she does not have any open wounds or signs of deep tissue injury at the sites. She states that when she lies in bed on her back that's when it typically hurts but again I see no evidence of pressure injury. It sounds to me that she may be experiencing some wrap up the in this regard. Lastly she has an area on her central chest where there is a lesion that is beginning to become painful to her to touch. She states this came up over the past several months although she doesn't know exactly when she first saw this. There is no sign of fluid underneath this area and it does not appear to be an abscess at all as best I could tell. Nonetheless it HAVILAH, TOPOR ([id number]) does have me somewhat concerned about the possibility of a skin cancer just based on the appearance today. No fevers, chills, nausea, or vomiting noted at this time. 03/07/18 on evaluation today patient actually appears to be doing decently well all things considering in regard to her ulcers. Overall in regard to the lower extremity ulcer she's made good improvement. In regard to the chest ulceration which is a result of the biopsy it appears that she is doing better. We did get the report back which seems to indicate that she may have clear margins although I do not know that for sure based on the fact that there is some conflicting information in the first paragraph stating that this appear to be a superficial portion of a squamous cell carcinoma. Nonetheless it goes on to state that the section through the middle indicates that there are clear margins in regard to what was removed. Nonetheless I did contact the pathologist who was going to apply an addendum to the report to clarify after looking back at the slides. I will contact the patient as soon as I have that information. We will make additional follow-ups as  necessary based on the results. 03/14/18 on evaluation today patient  appears to be doing excellent in effect her leg is completely healed. The wound on her chest is almost completely healed and fortunately we got news back on the revised study from her biopsy which the pathology report now shows that the lesion that was removed had clear margins. Overall I'm very pleased about this. The patient is extremely pleased. 03/28/18 on evaluation today patient appears to be doing rather well in regard to her chest ulcer. This in fact appears to be completely healed which is excellent news. Fortunately there is no signs of infection at this time. Patient History Information obtained from Patient. Family History Cancer - Siblings,Mother, Lung Disease - Siblings, Stroke - Siblings, No family history of Hypertension, Kidney Disease, Thyroid Problems, Tuberculosis. Social History Never smoker, Marital Status - Widowed, Alcohol Use - Never, Drug Use - No History, Caffeine Use - Daily. Medical History Eyes Denies history of Cataracts, Glaucoma, Optic Neuritis Ear/Nose/Mouth/Throat Denies history of Chronic sinus problems/congestion, Middle ear problems Hematologic/Lymphatic Denies history of Anemia, Hemophilia, Human Immunodeficiency Virus, Lymphedema, Sickle Cell Disease Respiratory Denies history of Aspiration, Asthma, Chronic Obstructive Pulmonary Disease (COPD), Pneumothorax, Sleep Apnea, Tuberculosis Cardiovascular Patient has history of Arrhythmia - a fib, Hypertension Denies history of Angina, Congestive Heart Failure, Coronary Artery Disease, Deep Vein Thrombosis, Hypotension, Myocardial Infarction, Peripheral Arterial Disease, Peripheral Venous Disease, Phlebitis, Vasculitis Gastrointestinal Denies history of Cirrhosis , Colitis, Crohn s, Hepatitis A, Hepatitis B, Hepatitis C Endocrine Denies history of Type I Diabetes, Type II Diabetes Immunological Denies history of Lupus Erythematosus, Raynaud s, Scleroderma Integumentary (Skin) Denies history of History  of Burn, History of pressure wounds Musculoskeletal Patient has history of Gout, Osteoarthritis Denies history of Rheumatoid Arthritis, Osteomyelitis Neurologic Denies history of Dementia, Neuropathy, Quadriplegia, Paraplegia, Seizure Disorder Oncologic Denies history of Received Chemotherapy, Received Radiation SHENICE, DOLDER ([id number]) Psychiatric Denies history of Anorexia/bulimia, Confinement Anxiety Medical And Surgical History Notes Respiratory mycobacterial pulmonary disease Cardiovascular varicose veins Review of Systems (ROS) Constitutional Symptoms (General Health) Denies complaints or symptoms of Fever, Chills. Respiratory The patient has no complaints or symptoms. Cardiovascular The patient has no complaints or symptoms. Psychiatric The patient has no complaints or symptoms. Objective Constitutional Well-nourished and well-hydrated in no acute distress. Vitals Time Taken: 9:30 AM, Height: 62 in, Weight: 115 lbs, BMI: 21, Temperature: 97.9 F, Pulse: 53 bpm, Respiratory Rate: 16 breaths/min, Blood Pressure: 152/40 mmHg. Respiratory normal breathing without difficulty. clear to auscultation bilaterally. Cardiovascular regular rate and rhythm with normal S1, S2. Psychiatric this patient is able to make decisions and demonstrates good insight into disease process. Alert and Oriented x 3. pleasant and cooperative. General Notes: Upon evaluation and inspection at this point patient states that she is had no other issues other than having some neck, left arm, and chest pain that she states happens in regard to when she is laying down and seems to emanate from her neck. With that being said she has not really have this checked out advised her that she's to contact her cardiologist ASAP in this regard in order to have that further evaluated. Based on the history of what she is telling me it sounds like this may be more of a nerve issue emanating from her neck but  with her cardiac history I would want her to be completely sure. She understands. Integumentary (Hair, Skin) Wound #3 status is Healed - Epithelialized. Original cause of wound was Surgical Injury. The wound is located on the  Midline Chest. The wound measures 0cm length x 0cm width x 0cm depth; 0cm^2 area and 0cm^3 volume. There is no tunneling or undermining noted. There is a none present amount of drainage noted. The wound margin is flat and intact. There is no granulation within the wound bed. There is no necrotic tissue within the wound bed. The periwound skin appearance did not KABAO, LEITE. ([id number]) exhibit: Callus, Crepitus, Excoriation, Induration, Rash, Scarring, Dry/Scaly, Maceration, Atrophie Blanche, Cyanosis, Ecchymosis, Hemosiderin Staining, Mottled, Pallor, Rubor, Erythema. Periwound temperature was noted as No Abnormality. The periwound has tenderness on palpation. Assessment Active Problems ICD-10 Lymphedema, not elsewhere classified Non-pressure chronic ulcer of other part of left lower leg with fat layer exposed Disorder of the skin and subcutaneous tissue, unspecified Paroxysmal atrial fibrillation Plan Discharge From [facility]: Discharge from [place] For me wound care standpoint the patient is discharged from wound care services as of today. Subsequently however I do feel like she needs to see Dr. Karl Bales house her cardiologist ASAP in order to evaluate the issues she's been having with her neck, arm, and chest intermittently to ensure this is not felt to be cardiac in nature. She is in agreement with this plan. Subsequently if that is not cardiac in nature that it is likely her neck causing the issue in which case I would have her seeing neck/back specialist. Otherwise we will see her back for reevaluation as needed in the future. Electronic Signature(s) Signed: 03/28/2018 5:12:31 PM By: Worthy Keeler PA-C Entered By: Worthy Keeler on  03/28/2018 10:15:30 Shirah Roseman Michael ([id number]) -------------------------------------------------------------------------------- ROS/PFSH Details Patient Name: Jerrol Helmers Michael Date of Service: 03/28/2018 9:30 AM Medical Record Number: [id number] Patient Account Number: [id number] Date of Birth/Sex: 09-18-1924 (85 y.o. F) Treating RN: Montey Hora Primary Care Provider: Dion Body Other Clinician: Referring Provider: Dion Body Treating Provider/Extender: Melburn Hake, Ruthe Roemer Weeks in Treatment: 4 Information Obtained From Patient Wound History Do you currently have one or more open woundso No Have you tested positive for osteomyelitis (bone infection)o No Have you had any tests for circulation on your legso No Constitutional Symptoms (General Health) Complaints and Symptoms: Negative for: Fever; Chills Eyes Medical History: Negative for: Cataracts; Glaucoma; Optic Neuritis Ear/Nose/Mouth/Throat Medical History: Negative for: Chronic sinus problems/congestion; Middle ear problems Hematologic/Lymphatic Medical History: Negative for: Anemia; Hemophilia; Human Immunodeficiency Virus; Lymphedema; Sickle Cell Disease Respiratory Complaints and Symptoms: No Complaints or Symptoms Medical History: Negative for: Aspiration; Asthma; Chronic Obstructive Pulmonary Disease (COPD); Pneumothorax; Sleep Apnea; Tuberculosis Past Medical History Notes: mycobacterial pulmonary disease Cardiovascular Complaints and Symptoms: No Complaints or Symptoms Medical History: Positive for: Arrhythmia - a fib; Hypertension Negative for: Angina; Congestive Heart Failure; Coronary Artery Disease; Deep Vein Thrombosis; Hypotension; Myocardial Infarction; Peripheral Arterial Disease; Peripheral Venous Disease; Phlebitis; Vasculitis Past Medical History Notes: varicose veins PHIONA, RAMNAUTH ([id number]) Gastrointestinal Medical History: Negative for: Cirrhosis ; Colitis; Crohnos;  Hepatitis A; Hepatitis B; Hepatitis C Endocrine Medical History: Negative for: Type I Diabetes; Type II Diabetes Immunological Medical History: Negative for: Lupus Erythematosus; Raynaudos; Scleroderma Integumentary (Skin) Medical History: Negative for: History of Burn; History of pressure wounds Musculoskeletal Medical History: Positive for: Gout; Osteoarthritis Negative for: Rheumatoid Arthritis; Osteomyelitis Neurologic Medical History: Negative for: Dementia; Neuropathy; Quadriplegia; Paraplegia; Seizure Disorder Oncologic Medical History: Negative for: Received Chemotherapy; Received Radiation Psychiatric Complaints and Symptoms: No Complaints or Symptoms Medical History: Negative for: Anorexia/bulimia; Confinement Anxiety Immunizations Pneumococcal Vaccine: Received Pneumococcal Vaccination: Yes Immunization Notes: up to date Implantable Devices Family and Social  History Cancer: Yes - Siblings,Mother; Hypertension: No; Kidney Disease: No; Lung Disease: Yes - Siblings; Stroke: Yes - Siblings; Thyroid Problems: No; Tuberculosis: No; Never smoker; Marital Status - Widowed; Alcohol Use: Never; Drug Use: No History; Caffeine Use: Daily; Financial Concerns: No; Food, Clothing or Shelter Needs: No; Support System Lacking: No; Transportation Concerns: No; Advanced Directives: No; Patient does not want information on Advanced Directives; Do not resuscitate: No; Living Will: Yes (Not Provided); Medical Power of Attorney: Yes - daughter Pablo Ledger (Not Provided) INSIYA, OSHEA ([id number]) Physician Affirmation I have reviewed and agree with the above information. Electronic Signature(s) Signed: 03/28/2018 5:12:31 PM By: Worthy Keeler PA-C Signed: 03/28/2018 5:16:27 PM By: Montey Hora Entered By: Worthy Keeler on 03/28/2018 10:13:14 Brizeida Mcmurry Michael ([id number]) -------------------------------------------------------------------------------- SuperBill  Details Patient Name: Nataya Bastedo Michael Date of Service: 03/28/2018 Medical Record Number: [id number] Patient Account Number: [id number] Date of Birth/Sex: [birth year] (85 y.o. F) Treating RN: Montey Hora Primary Care Provider: Dion Body Other Clinician: Referring Provider: Dion Body Treating Provider/Extender: Melburn Hake, [place] Filice Weeks in Treatment: 4 Diagnosis Coding ICD-10 Codes Code Description I89.0 Lymphedema, not elsewhere classified L97.822 Non-pressure chronic ulcer of other part of left lower leg with fat layer exposed L98.9 Disorder of the skin and subcutaneous tissue, unspecified I48.0 Paroxysmal atrial fibrillation Facility Procedures CPT4 Code: 32951884 Description: 16606 - WOUND CARE VISIT-LEV 2 EST PT Modifier: Quantity: 1 Physician Procedures CPT4 Code Description: 3016010 99213 - WC PHYS LEVEL 3 - EST PT ICD-10 Diagnosis Description I89.0 Lymphedema, not elsewhere classified L97.822 Non-pressure chronic ulcer of other part of left lower leg wit L98.9 Disorder of the skin and subcutaneous  tissue, unspecified I48.0 Paroxysmal atrial fibrillation Modifier: h fat layer expos Quantity: 1 ed Electronic Signature(s) Signed: 03/28/2018 5:12:31 PM By: Worthy Keeler PA-C Entered By: Worthy Keeler on 03/28/2018 10:15:42

## 2018-04-04 NOTE — Progress Notes (Signed)
BURNETT, LIEBER ([id number]) Visit Report for 03/28/2018 Arrival Information Details Patient Name: Joanne Michael, Joanne Michael Date of Service: 03/28/2018 9:30 AM Medical Record Number: [id number] Patient Account Number: [id number] Date of Birth/Sex: [birth year] (85 y.o. F) Treating RN: Harold Barban Primary Care Guilherme Schwenke: Dion Body Other Clinician: Referring Marketia Stallsmith: Dion Body Treating Floris Neuhaus/Extender: Melburn Hake, Joanne Weeks in Treatment: 4 Visit Information History Since Last Visit Added or deleted any medications: No Patient Arrived: Ambulatory Any new allergies or adverse reactions: No Arrival Time: 09:29 Had a fall or experienced change in No Accompanied By: son-in-law activities of daily living that may affect Transfer Assistance: None risk of falls: Patient Identification Verified: Yes Signs or symptoms of abuse/neglect since last visito No Secondary Verification Process Completed: Yes Hospitalized since last visit: No Has Dressing in Place as Prescribed: Yes Pain Present Now: No Electronic Signature(s) Signed: 04/03/2018 11:43:02 AM By: Harold Barban Entered By: Harold Barban on 03/28/2018 09:30:42 Joanne Michael ([id number]) -------------------------------------------------------------------------------- Clinic Level of Care Assessment Details Patient Name: Joanne Michael Date of Service: 03/28/2018 9:30 AM Medical Record Number: [id number] Patient Account Number: [id number] Date of Birth/Sex: 08-14-1924 (85 y.o. F) Treating RN: Montey Hora Primary Care Shantia Sanford: Dion Body Other Clinician: Referring Royal Beirne: Dion Body Treating Riad Wagley/Extender: Melburn Hake, Joanne Weeks in Treatment: 4 Clinic Level of Care Assessment Items TOOL 4 Quantity Score []  - Use when only an EandM is performed on FOLLOW-UP visit 0 ASSESSMENTS - Nursing Assessment / Reassessment X - Reassessment of Co-morbidities (includes updates in patient status) 1  10 X- 1 5 Reassessment of Adherence to Treatment Plan ASSESSMENTS - Wound and Skin Assessment / Reassessment X - Simple Wound Assessment / Reassessment - one wound 1 5 []  - 0 Complex Wound Assessment / Reassessment - multiple wounds []  - 0 Dermatologic / Skin Assessment (not related to wound area) ASSESSMENTS - Focused Assessment []  - Circumferential Edema Measurements - multi extremities 0 []  - 0 Nutritional Assessment / Counseling / Intervention []  - 0 Lower Extremity Assessment (monofilament, tuning fork, pulses) []  - 0 Peripheral Arterial Disease Assessment (using hand held doppler) ASSESSMENTS - Ostomy and/or Continence Assessment and Care []  - Incontinence Assessment and Management 0 []  - 0 Ostomy Care Assessment and Management (repouching, etc.) PROCESS - Coordination of Care X - Simple Patient / Family Education for ongoing care 1 15 []  - 0 Complex (extensive) Patient / Family Education for ongoing care []  - 0 Staff obtains Programmer, systems, Records, Test Results / Process Orders X- 1 10 Staff telephones HHA, Nursing Homes / Clarify orders / etc []  - 0 Routine Transfer to another Facility (non-emergent condition) []  - 0 Routine Hospital Admission (non-emergent condition) []  - 0 New Admissions / Biomedical engineer / Ordering NPWT, Apligraf, etc. []  - 0 Emergency Hospital Admission (emergent condition) X- 1 10 Simple Discharge Coordination RANDALL, RAMPERSAD ([id number]) []  - 0 Complex (extensive) Discharge Coordination PROCESS - Special Needs []  - Pediatric / Minor Patient Management 0 []  - 0 Isolation Patient Management []  - 0 Hearing / Language / Visual special needs []  - 0 Assessment of Community assistance (transportation, D/C planning, etc.) []  - 0 Additional assistance / Altered mentation []  - 0 Support Surface(s) Assessment (bed, cushion, seat, etc.) INTERVENTIONS - Wound Cleansing / Measurement X - Simple Wound Cleansing - one wound 1 5 []  -  0 Complex Wound Cleansing - multiple wounds X- 1 5 Wound Imaging (photographs - any number of wounds) []  - 0 Wound Tracing (instead of photographs) X- 1 5 Simple Wound  Measurement - one wound []  - 0 Complex Wound Measurement - multiple wounds INTERVENTIONS - Wound Dressings []  - Small Wound Dressing one or multiple wounds 0 []  - 0 Medium Wound Dressing one or multiple wounds []  - 0 Large Wound Dressing one or multiple wounds []  - 0 Application of Medications - topical []  - 0 Application of Medications - injection INTERVENTIONS - Miscellaneous []  - External ear exam 0 []  - 0 Specimen Collection (cultures, biopsies, blood, body fluids, etc.) []  - 0 Specimen(s) / Culture(s) sent or taken to Lab for analysis []  - 0 Patient Transfer (multiple staff / Civil Service fast streamer / Similar devices) []  - 0 Simple Staple / Suture removal (25 or less) []  - 0 Complex Staple / Suture removal (26 or more) []  - 0 Hypo / Hyperglycemic Management (close monitor of Blood Glucose) []  - 0 Ankle / Brachial Index (ABI) - do not check if billed separately X- 1 5 Vital Signs Joanne Michael, Joanne O. ([id number]) Has the patient been seen at the hospital within the last three years: Yes Total Score: 75 Level Of Care: New/Established - Level 2 Electronic Signature(s) Signed: 03/28/2018 5:16:27 PM By: Montey Hora Entered By: Montey Hora on 03/28/2018 10:08:50 Joanne Michael ([id number]) -------------------------------------------------------------------------------- Encounter Discharge Information Details Patient Name: Joanne Michael Date of Service: 03/28/2018 9:30 AM Medical Record Number: [id number] Patient Account Number: [id number] Date of Birth/Sex: [birth year] (85 y.o. F) Treating RN: Montey Hora Primary Care Javin Nong: Dion Body Other Clinician: Referring Melquan Ernsberger: Dion Body Treating Dorian Renfro/Extender: Melburn Hake, Joanne Weeks in Treatment: 4 Encounter Discharge Information  Items Discharge Condition: Stable Ambulatory Status: Ambulatory Discharge Destination: Home Transportation: Private Auto Accompanied By: self Schedule Follow-up Appointment: No Clinical Summary of Care: Electronic Signature(s) Signed: 03/28/2018 5:16:27 PM By: Montey Hora Entered By: Montey Hora on 03/28/2018 10:09:05 Joanne Michael ([id number]) -------------------------------------------------------------------------------- Lower Extremity Assessment Details Patient Name: Joanne Michael Date of Service: 03/28/2018 9:30 AM Medical Record Number: [id number] Patient Account Number: [id number] Date of Birth/Sex: 07-Nov-1924 (85 y.o. F) Treating RN: Harold Barban Primary Care Alver Leete: Dion Body Other Clinician: Referring Sissy Goetzke: Dion Body Treating Dalton Molesworth/Extender: Melburn Hake, Joanne Weeks in Treatment: 4 Electronic Signature(s) Signed: 04/03/2018 11:43:02 AM By: Harold Barban Entered By: Harold Barban on 03/28/2018 09:33:30 Joanne Michael ([id number]) -------------------------------------------------------------------------------- [place] Details Patient Name: Joanne Michael Date of Service: 03/28/2018 9:30 AM Medical Record Number: [id number] Patient Account Number: [id number] Date of Birth/Sex: [birth year] (85 y.o. F) Treating RN: Montey Hora Primary Care Jaxie Racanelli: Dion Body Other Clinician: Referring Andjela Wickes: Dion Body Treating Ciclaly Mulcahey/Extender: Melburn Hake, Joanne Weeks in Treatment: 4 Active Inactive Electronic Signature(s) Signed: 03/28/2018 5:16:27 PM By: Montey Hora Entered By: Montey Hora on 03/28/2018 10:08:12 Joanne Michael ([id number]) -------------------------------------------------------------------------------- Pain Assessment Details Patient Name: Joanne Michael Date of Service: 03/28/2018 9:30 AM Medical Record Number: [id number] Patient Account Number: [id number] Date of  Birth/Sex: 01/10/1925 (85 y.o. F) Treating RN: Harold Barban Primary Care Wretha Laris: Dion Body Other Clinician: Referring Shaketa Serafin: Dion Body Treating Jasilyn Holderman/Extender: Melburn Hake, Joanne Weeks in Treatment: 4 Active Problems Location of Pain Severity and Description of Pain Patient Has Paino No Site Locations Pain Management and Medication Current Pain Management: Electronic Signature(s) Signed: 04/03/2018 11:43:02 AM By: Harold Barban Entered By: Harold Barban on 03/28/2018 09:30:49 Joanne Michael ([id number]) -------------------------------------------------------------------------------- Patient/Caregiver Education Details Patient Name: Joanne Michael Date of Service: 03/28/2018 9:30 AM Medical Record Number: [id number] Patient Account Number: [id number] Date of Birth/Gender: 12/23/1924 (85 y.o. F) Treating RN: Montey Hora Primary Care  Physician: Dion Body Other Clinician: Referring Physician: Dion Body Treating Physician/Extender: Sharalyn Ink in Treatment: 4 Education Assessment Education Provided To: Patient Education Topics Provided Notes patient educated to f/u with cardiology and PCP about arm and neck pain at night Electronic Signature(s) Signed: 03/28/2018 5:16:27 PM By: Montey Hora Entered By: Montey Hora on 03/28/2018 10:09:43 Joanne Michael ([id number]) -------------------------------------------------------------------------------- Wound Assessment Details Patient Name: Joanne Michael Date of Service: 03/28/2018 9:30 AM Medical Record Number: [id number] Patient Account Number: [id number] Date of Birth/Sex: Mar 30, 1924 (85 y.o. F) Treating RN: Montey Hora Primary Care Aarit Kashuba: Dion Body Other Clinician: Referring Willodene Stallings: Dion Body Treating Caeson Filippi/Extender: Melburn Hake, Joanne Weeks in Treatment: 4 Wound Status Wound Number: 3 Primary Etiology: Open Surgical Wound Wound  Location: Midline Chest Wound Status: Healed - Epithelialized Wounding Event: Surgical Injury Comorbid Arrhythmia, Hypertension, Gout, History: Osteoarthritis Date Acquired: 02/28/2018 Weeks Of Treatment: 4 Clustered Wound: No Wound Measurements Length: (cm) 0 % Reduct Width: (cm) 0 % Reduct Depth: (cm) 0 Epitheli Area: (cm) 0 Tunneli Volume: (cm) 0 Undermi ion in Area: 100% ion in Volume: 100% alization: Large (67-100%) ng: No ning: No Wound Description Classification: Partial Thickness Foul Odo Wound Margin: Flat and Intact Slough/F Exudate Amount: None Present r After Cleansing: No ibrino No Wound Bed Granulation Amount: None Present (0%) Exposed Structure Necrotic Amount: None Present (0%) Fascia Exposed: No Fat Layer (Subcutaneous Tissue) Exposed: No Tendon Exposed: No Muscle Exposed: No Joint Exposed: No Bone Exposed: No Periwound Skin Texture Texture Color No Abnormalities Noted: No No Abnormalities Noted: No Callus: No Atrophie Blanche: No Crepitus: No Cyanosis: No Excoriation: No Ecchymosis: No Induration: No Erythema: No Rash: No Hemosiderin Staining: No Scarring: No Mottled: No Pallor: No Moisture Rubor: No No Abnormalities Noted: No Dry / Scaly: No Temperature / Pain Maceration: No Temperature: No Abnormality Tenderness on Palpation: Yes Wound Preparation KIRBY, ARGUETA ([id number]) Topical Anesthetic Applied: None Electronic Signature(s) Signed: 03/28/2018 5:16:27 PM By: Montey Hora Entered By: Montey Hora on 03/28/2018 10:07:52 Joanne Michael ([id number]) -------------------------------------------------------------------------------- Vitals Details Patient Name: Joanne Michael Date of Service: 03/28/2018 9:30 AM Medical Record Number: [id number] Patient Account Number: [id number] Date of Birth/Sex: 05/12/1924 (85 y.o. F) Treating RN: Harold Barban Primary Care Kortny Lirette: Dion Body Other Clinician: Referring  Lovell Roe: Dion Body Treating Jawan Chavarria/Extender: Melburn Hake, Joanne Weeks in Treatment: 4 Vital Signs Time Taken: 09:30 Temperature (F): 97.9 Height (in): 62 Pulse (bpm): 53 Weight (lbs): 115 Respiratory Rate (breaths/min): 16 Body Mass Index (BMI): 21 Blood Pressure (mmHg): 152/40 Reference Range: 80 - 120 mg / dl Airway Electronic Signature(s) Signed: 04/03/2018 11:43:02 AM By: Harold Barban Entered By: Harold Barban on 03/28/2018 09:31:17

## 2018-04-15 ENCOUNTER — Other Ambulatory Visit: Payer: Self-pay | Admitting: Cardiovascular Disease

## 2018-06-24 ENCOUNTER — Other Ambulatory Visit: Payer: Self-pay | Admitting: Cardiovascular Disease

## 2018-06-24 NOTE — Telephone Encounter (Signed)
Refill request

## 2018-07-25 ENCOUNTER — Other Ambulatory Visit: Payer: Self-pay | Admitting: Cardiovascular Disease

## 2018-09-29 ENCOUNTER — Other Ambulatory Visit: Payer: Self-pay | Admitting: Neurology

## 2018-09-29 DIAGNOSIS — R413 Other amnesia: Secondary | ICD-10-CM

## 2018-10-09 ENCOUNTER — Ambulatory Visit
Admission: RE | Admit: 2018-10-09 | Discharge: 2018-10-09 | Disposition: A | Discharge status: Home / Self Care | Payer: Medicare Other | Source: Ambulatory Visit | Attending: Neurology | Admitting: Neurology

## 2018-10-09 ENCOUNTER — Other Ambulatory Visit: Payer: Self-pay

## 2018-10-09 DIAGNOSIS — R413 Other amnesia: Secondary | ICD-10-CM

## 2018-11-14 ENCOUNTER — Other Ambulatory Visit: Payer: Self-pay | Admitting: Family Medicine

## 2018-11-14 DIAGNOSIS — R1032 Left lower quadrant pain: Secondary | ICD-10-CM

## 2018-11-20 ENCOUNTER — Ambulatory Visit
Admission: RE | Admit: 2018-11-20 | Discharge: 2018-11-20 | Disposition: A | Discharge status: Home / Self Care | Payer: Medicare Other | Source: Ambulatory Visit | Attending: Family Medicine | Admitting: Family Medicine

## 2018-11-20 ENCOUNTER — Other Ambulatory Visit: Payer: Self-pay

## 2018-11-20 DIAGNOSIS — R1032 Left lower quadrant pain: Secondary | ICD-10-CM | POA: Insufficient documentation

## 2018-12-26 ENCOUNTER — Other Ambulatory Visit: Payer: Self-pay | Admitting: Cardiovascular Disease

## 2018-12-30 DIAGNOSIS — C4492 Squamous cell carcinoma of skin, unspecified: Secondary | ICD-10-CM

## 2018-12-30 HISTORY — DX: Squamous cell carcinoma of skin, unspecified: C44.92

## 2019-01-24 ENCOUNTER — Other Ambulatory Visit: Payer: Self-pay | Admitting: Cardiovascular Disease

## 2019-01-26 NOTE — Telephone Encounter (Signed)
Pt's age 85, wt 51.5 kg, SCr 2.54, CrCl 11.49, last ov w/ Dr. Rockey Situ 03/21/18 - will be due for f/u in ~1 month.  Will send in 3 mo supply to get her to next appt for eval & labs.

## 2019-01-26 NOTE — Telephone Encounter (Signed)
Please review for refill. Thanks!  

## 2019-03-23 ENCOUNTER — Other Ambulatory Visit: Payer: Self-pay | Admitting: Cardiovascular Disease

## 2019-04-14 ENCOUNTER — Ambulatory Visit (INDEPENDENT_AMBULATORY_CARE_PROVIDER_SITE_OTHER): Payer: Medicare Other | Admitting: Cardiovascular Disease

## 2019-04-14 ENCOUNTER — Encounter: Payer: Self-pay | Admitting: Cardiovascular Disease

## 2019-04-14 ENCOUNTER — Other Ambulatory Visit: Payer: Self-pay

## 2019-04-14 VITALS — BP 144/60 | HR 57 | Ht 59.0 in | Wt 109.2 lb

## 2019-04-14 DIAGNOSIS — I5032 Chronic diastolic (congestive) heart failure: Secondary | ICD-10-CM

## 2019-04-14 DIAGNOSIS — E782 Mixed hyperlipidemia: Secondary | ICD-10-CM

## 2019-04-14 DIAGNOSIS — I701 Atherosclerosis of renal artery: Secondary | ICD-10-CM

## 2019-04-14 DIAGNOSIS — I480 Paroxysmal atrial fibrillation: Secondary | ICD-10-CM

## 2019-04-14 DIAGNOSIS — N183 Chronic kidney disease, stage 3 unspecified: Secondary | ICD-10-CM

## 2019-04-14 DIAGNOSIS — I350 Nonrheumatic aortic (valve) stenosis: Secondary | ICD-10-CM

## 2019-04-14 DIAGNOSIS — I10 Essential (primary) hypertension: Secondary | ICD-10-CM

## 2019-04-14 DIAGNOSIS — R0989 Other specified symptoms and signs involving the circulatory and respiratory systems: Secondary | ICD-10-CM

## 2019-04-14 NOTE — Progress Notes (Signed)
Cardiology Office Note  Date:  04/14/2019   ID:  Joanne Michael, DOB 10-13-1924, MRN [id number]  PCP:  Dion Body, MD   Chief Complaint  Patient presents with  . office visit    12 month F/U; Meds verbally reviewed with patient.    HPI:  Joanne Michael is a pleasant 86 yo woman with  mild aortic valve stenosis in 2011,  GERD,  hyperlipidemia,  hypertension  Previous episode  of near syncope Mild carotid disease Less than 39% b/l disease 11/2015 who presented for lower extremity edema that started in December 2014,   improved symptoms after amlodipine was held She presents today for follow-up of her blood pressure,Leg bruising  In follow-up today she presents with her daughter In general she feels well Daughter reports some memory issues Very sedentary, stays in the house Has not had Covid vaccine  Lab work reviewed in detail CR 2.54 from 02/2018 CR 2.1 in oct 2020 Followed by Dr. Candiss Norse On lasix 3x a week  HGB 9.6, drop over past 2-3 years Does have some mild lower extremity edema  Other lab work reviewed Total chol 117 LDL 49  Walks around the house Rare fall, end up on ground, does not seem to hurt herself  She does have periodic panic attacks, anxiety Gets SOB Previously seen in the pain clinic  EKG personally reviewed by myself on todays visit Shows normal sinus rhythm rate 57 bpm no significant ST-T wave changes  Other past medical history reviewed Seen in the emergency room March 03, 2018 for chest pain Dx with musculoskeletal pain   07/31/16 which showed an EF of 65-70% along with mild/moderate AS/AR and mild MR.  admission 6/26 to 08/30/2016  for chest pain elevated troponin chronic kidney disease new onset atrial fibrillation,  Felt to have noncardiac chest pain VQ scan low probability PE Possible left lower lobe pneumonia treated with antibiotics Creatinine 2.28 at peak Plavix discontinued by Dr. Lucky Cowboy On anticoagulation for atrial  fibrillation. She converted to normal sinus in the hospital   echocardiogram with normal ejection fraction Mild to moderate aortic valve stenosis   Takes miralex, prunes For constipation Continues to have coughing, relying on her albuterol inhaler Previously seen by pulmonary 2 years ago, has not had follow-up Still feels congested. Prior history of pneumonias March 2018  Previously seen in the ER seen in the emergency room 05/03/2016 for dysuria, fecal impaction Started on Miramax Seen in the emergency room 04/16/2016 contusions to the face Seen in the emergency room 01/17/2016 urinary retention fecal impaction  Other past medical history reviewed Previous Large hematoma,Left lower extremity  Previous hospital for Hypertensive urgency due to severe Renal artery stenosis right  Renal angioplasty, 3 weeks ago, Dr. dew On asa and plavix   hospital for PNA ,  05/09/2016 RequiredABX,  Low sodium On arrival,Received normal saline, sodium 124 up to 137 Potassium 3.1 Had diarrhea when she got home  Previous leg edema resolved by holding amlodipine Denies having any further episodes of near syncope or syncope  08/08/2014 she was at church when she developed syncope/near syncope. She was in a sitting position. Reports indicate low blood pressure. She was taken to the emergency room, lab work showed elevated BUN and creatinine consistent with dehydration. She was given fluids with improvement of her symptoms. She denied any recent illnesses. Several days prior she had radiofrequency ablation of her back.   Lab work from primary care shows creatinine 2.0, GFR in the 20s she was  previously on lisinopril and HCTZ, pill. This was held and she was changed to amlodipine Laboratory from 07/08/2011 shows creatinine 2.1, GFR 22, BUN 21, potassium 3.3  Prior echocardiogram in 2011 showing normal LV systolic function, diastolic relaxation abnormality, mild aortic valve stenosis with no  significant gradient, mild MR, high normal right ventricular systolic pressures  PMH:   has a past medical history of Arrhythmia, Arthritis, Cancer ([place]), Cat allergies, CHF (congestive heart failure) ([place]), Chronic renal failure, Fatigue, GERD (gastroesophageal reflux disease), Gout, H/O Clostridium difficile infection, Hay fever, Hiatal hernia, HLD (hyperlipidemia), HTN (hypertension), Iron deficiency anemia, and Spinal stenosis.  PSH:    Past Surgical History:  Procedure Laterality Date  . APPENDECTOMY    . BACK SURGERY    . CATARACT EXTRACTION    . CHOLECYSTECTOMY    . feet surgery    . HERNIA REPAIR    . knee replacement- both    . MELANOMA EXCISION    . NOSE SURGERY    . RENAL ANGIOGRAPHY N/A 05/07/2016   Procedure: Renal Angiography;  Surgeon: Algernon Huxley, MD;  Location: [facility];  Service: Cardiovascular;  Laterality: N/A;  . ROTATOR CUFF REPAIR     left  . SHOULDER SURGERY    . TONSILLECTOMY    . TOTAL ABDOMINAL HYSTERECTOMY      Current Outpatient Medications  Medication Sig Dispense Refill  . allopurinol (ZYLOPRIM) 100 MG tablet Take 100 mg by mouth 2 (two) times daily.    Marland Kitchen amiodarone (PACERONE) 200 MG tablet TAKE ONE TABLET BY MOUTH EVERY DAY 30 tablet 0  . Ascorbic Acid (VITAMIN C) 1000 MG tablet Take 1,000 mg by mouth daily.    Marland Kitchen atorvastatin (LIPITOR) 20 MG tablet Take 20 mg by mouth at bedtime.     . Calcium Carbonate-Vitamin D (CALTRATE 600+D) 600-400 MG-UNIT per tablet Take 1 tablet by mouth daily.      . cetirizine (ZYRTEC) 10 MG chewable tablet Chew 10 mg by mouth daily.      . cloNIDine (CATAPRES) 0.1 MG tablet Take 1 tablet (0.1 mg total) by mouth 2 (two) times daily. 60 tablet 11  . ELIQUIS 2.5 MG TABS tablet TAKE ONE TABLET BY MOUTH TWICE DAILY 60 tablet 2  . enalapril (VASOTEC) 2.5 MG tablet Take 2.5 mg by mouth daily.    Marland Kitchen escitalopram (LEXAPRO) 5 MG tablet Take 5 mg by mouth daily.    . ferrous sulfate 325 (65 FE) MG tablet Take 325 mg by  mouth daily with breakfast.    . fluticasone (FLONASE) 50 MCG/ACT nasal spray Place 2 sprays into both nostrils at bedtime as needed for allergies or rhinitis. 16 g 0  . furosemide (LASIX) 20 MG tablet TAKE ONE TABLET EVERY DAY AS NEEDED 30 tablet 3  . hydrALAZINE (APRESOLINE) 25 MG tablet Take 3 tablets (75 mg total) by mouth every 8 (eight) hours. 60 tablet 0  . isosorbide mononitrate (IMDUR) 30 MG 24 hr tablet Take 1 tablet (30 mg total) by mouth daily. 30 tablet 0  . Multiple Vitamin (MULTIVITAMIN) tablet Take 1 tablet by mouth daily.    . Multiple Vitamins-Minerals (PRESERVISION AREDS 2 PO) Take 1 capsule by mouth daily.    . pantoprazole (PROTONIX) 40 MG tablet Take 40 mg by mouth at bedtime.  30 tablet 11  . Probiotic Product (PHILLIPS COLON HEALTH PO) Take 1 capsule by mouth daily.    Marland Kitchen albuterol (PROVENTIL HFA;VENTOLIN HFA) 108 (90 BASE) MCG/ACT inhaler Inhale 1-2 puffs into  the lungs every 6 (six) hours as needed for wheezing or shortness of breath. (Patient not taking: Reported on 04/14/2019) 1 Inhaler 6  . collagenase (SANTYL) ointment Apply 1 application topically daily.    Marland Kitchen guaiFENesin (MUCINEX) 600 MG 12 hr tablet Take by mouth 2 (two) times daily.     No current facility-administered medications for this visit.     Allergies:   Oxycodone, Codeine, Hydrocodone-acetaminophen, Iodine, Meperidine hcl, Other, and Propoxyphene n-acetaminophen   Social History:  The patient  reports that she has never smoked. She has never used smokeless tobacco. She reports current alcohol use of about 3.0 standard drinks of alcohol per week. She reports that she does not use drugs.   Family History:   family history includes Heart failure in her father; Liver cancer in her father; Lung cancer in her brother and sister; Ovarian cancer in her mother; Prostate cancer in her brother.    Review of Systems: Review of Systems  Constitutional: Negative.   HENT: Negative.   Respiratory: Negative.    Cardiovascular: Negative.   Gastrointestinal: Negative.   Musculoskeletal: Positive for joint pain.  Neurological: Negative.   Psychiatric/Behavioral: Positive for memory loss.  All other systems reviewed and are negative.   PHYSICAL EXAM: VS:  BP (!) 144/60 (BP Location: Left Arm, Patient Position: Sitting, Cuff Size: Normal)   Pulse (!) 57   Ht 4\' 11"  (1.499 m)   Wt 109 lb 4 oz (49.6 kg)   SpO2 98%   BMI 22.07 kg/m  , BMI Body mass index is 22.07 kg/m. Constitutional:  oriented to person, place, and time. No distress.  HENT:  Head: Grossly normal Eyes:  no discharge. No scleral icterus.  Neck: No JVD, no carotid bruits  Cardiovascular: Regular rate and rhythm, no murmurs appreciated Pulmonary/Chest: Clear to auscultation bilaterally, no wheezes or rails Abdominal: Soft.  no distension.  no tenderness.  Musculoskeletal: Normal range of motion Neurological:  normal muscle tone. Coordination normal. No atrophy Skin: Skin warm and dry Psychiatric: normal affect, pleasant   Recent Labs: No results found for requested labs within last 8760 hours.    Lipid Panel Lab Results  Component Value Date   CHOL 118 05/06/2016   HDL 46 05/06/2016   LDLCALC 54 05/06/2016   TRIG 89 05/06/2016      Wt Readings from Last 3 Encounters:  04/14/19 109 lb 4 oz (49.6 kg)  03/11/18 113 lb 8 oz (51.5 kg)  03/03/18 113 lb (51.3 kg)     ASSESSMENT AND PLAN:  Paroxysmal atrial fibrillation Asymptomatic, recommend she continue the Eliquis and amiodarone EKG reviewed ,normal sinus rhythm  Mixed hyperlipidemia - At goal, managed by primary care No medication changes made  Essential hypertension, benign -  Blood pressure is well controlled on today's visit. No changes made to the medications.  Aortic valve stenosis, etiology of cardiac valve disease unspecified -  Mild to moderate aortic valve stenosis, in 2018 We have ordered echocardiogram for reevaluation given intensity of  murmur  Chronic renal impairment, stage 2 (mild) -  Followed by nephrology, Dr. Candiss Norse May need Procrit if hemoglobin continues to drop Does not appear to have iron deficiency  Renal artery stenosis ([place]) stent placement by Dr. dew  , on eliquis Cholesterol at goal Stable  Chronic pain Previously seen by pain clinic, No significant symptoms on today's visit   Disposition:   F/U  One year   Total encounter time more than 25 minutes  Greater than 50% was  spent in counseling and coordination of care with the patient    Orders Placed This Encounter  Procedures  . EKG 12-Lead     Signed, Esmond Plants, M.D., Ph.D. 04/14/2019  [place], [place]

## 2019-04-14 NOTE — Patient Instructions (Addendum)
Echo for aortic valve stenosis, and regurgitation, murmur   Medication Instructions:  No changes  If you need a refill on your cardiac medications before your next appointment, please call your pharmacy.    Lab work: No new labs needed   If you have labs (blood work) drawn today and your tests are completely normal, you will receive your results only by: Marland Kitchen MyChart Message (if you have MyChart) OR . A paper copy in the mail If you have any lab test that is abnormal or we need to change your treatment, we will call you to review the results.   Testing/Procedures: Your physician has requested that you have an echocardiogram. Echocardiography is a painless test that uses sound waves to create images of your heart. It provides your doctor with information about the size and shape of your heart and how well your heart's chambers and valves are working. This procedure takes approximately one hour. There are no restrictions for this procedure.     Follow-Up: At [facility], you and your health needs are our priority.  As part of our continuing mission to provide you with exceptional heart care, we have created designated Provider Care Teams.  These Care Teams include your primary Cardiologist (physician) and Advanced Practice Providers (APPs -  Physician Assistants and Nurse Practitioners) who all work together to provide you with the care you need, when you need it.  . You will need a follow up appointment in 12 months  . Providers on your designated Care Team:   . Murray Hodgkins, NP . Christell Faith, PA-C . Marrianne Mood, PA-C  Any Other Special Instructions Will Be Listed Below (If Applicable).  COVID-19 Vaccine Information can be found at: [email or web address] For questions related to vaccine distribution or appointments, please email [email or web address] or call [phone].

## 2019-05-29 ENCOUNTER — Other Ambulatory Visit: Payer: Self-pay | Admitting: Cardiovascular Disease

## 2019-06-02 ENCOUNTER — Other Ambulatory Visit: Payer: Medicare Other

## 2019-07-02 ENCOUNTER — Other Ambulatory Visit: Payer: Self-pay | Admitting: Cardiovascular Disease

## 2019-07-23 ENCOUNTER — Other Ambulatory Visit: Payer: Self-pay | Admitting: Cardiovascular Disease

## 2019-07-23 NOTE — Telephone Encounter (Signed)
Refill request

## 2019-09-07 ENCOUNTER — Emergency Department: Payer: Medicare Other

## 2019-09-07 ENCOUNTER — Emergency Department
Admission: EM | Admit: 2019-09-07 | Discharge: 2019-09-08 | Disposition: A | Discharge status: Home / Self Care | Payer: Medicare Other | Attending: Emergency Medicine | Admitting: Emergency Medicine

## 2019-09-07 ENCOUNTER — Other Ambulatory Visit: Payer: Self-pay

## 2019-09-07 DIAGNOSIS — I4891 Unspecified atrial fibrillation: Secondary | ICD-10-CM | POA: Insufficient documentation

## 2019-09-07 DIAGNOSIS — F0390 Unspecified dementia without behavioral disturbance: Secondary | ICD-10-CM | POA: Insufficient documentation

## 2019-09-07 DIAGNOSIS — J449 Chronic obstructive pulmonary disease, unspecified: Secondary | ICD-10-CM | POA: Insufficient documentation

## 2019-09-07 DIAGNOSIS — Z7951 Long term (current) use of inhaled steroids: Secondary | ICD-10-CM | POA: Diagnosis not present

## 2019-09-07 DIAGNOSIS — R079 Chest pain, unspecified: Secondary | ICD-10-CM

## 2019-09-07 DIAGNOSIS — Z7901 Long term (current) use of anticoagulants: Secondary | ICD-10-CM | POA: Diagnosis not present

## 2019-09-07 DIAGNOSIS — I130 Hypertensive heart and chronic kidney disease with heart failure and stage 1 through stage 4 chronic kidney disease, or unspecified chronic kidney disease: Secondary | ICD-10-CM | POA: Diagnosis not present

## 2019-09-07 DIAGNOSIS — Z79899 Other long term (current) drug therapy: Secondary | ICD-10-CM | POA: Insufficient documentation

## 2019-09-07 DIAGNOSIS — I5032 Chronic diastolic (congestive) heart failure: Secondary | ICD-10-CM | POA: Insufficient documentation

## 2019-09-07 DIAGNOSIS — N184 Chronic kidney disease, stage 4 (severe): Secondary | ICD-10-CM | POA: Insufficient documentation

## 2019-09-07 DIAGNOSIS — Z96653 Presence of artificial knee joint, bilateral: Secondary | ICD-10-CM | POA: Diagnosis not present

## 2019-09-07 DIAGNOSIS — R0789 Other chest pain: Secondary | ICD-10-CM | POA: Diagnosis present

## 2019-09-07 HISTORY — DX: Unspecified atrial fibrillation: I48.91

## 2019-09-07 HISTORY — DX: Unspecified dementia, unspecified severity, without behavioral disturbance, psychotic disturbance, mood disturbance, and anxiety: F03.90

## 2019-09-07 LAB — BASIC METABOLIC PANEL
Anion gap: 11 (ref 5–15)
BUN: 103 mg/dL — ABNORMAL HIGH (ref 8–23)
CO2: 24 mmol/L (ref 22–32)
Calcium: 9.5 mg/dL (ref 8.9–10.3)
Chloride: 100 mmol/L (ref 98–111)
Creatinine, Ser: 2.5 mg/dL — ABNORMAL HIGH (ref 0.44–1.00)
GFR calc Af Amer: 19 mL/min — ABNORMAL LOW (ref >60)
GFR calc non Af Amer: 16 mL/min — ABNORMAL LOW (ref >60)
Glucose, Bld: 103 mg/dL — ABNORMAL HIGH (ref 70–99)
Potassium: 5.2 mmol/L — ABNORMAL HIGH (ref 3.5–5.1)
Sodium: 135 mmol/L (ref 135–145)

## 2019-09-07 LAB — CBC
HCT: 27.2 % — ABNORMAL LOW (ref 36.0–46.0)
Hemoglobin: 9 g/dL — ABNORMAL LOW (ref 12.0–15.0)
MCH: 32.3 pg (ref 26.0–34.0)
MCHC: 33.1 g/dL (ref 30.0–36.0)
MCV: 97.5 fL (ref 80.0–100.0)
Platelets: 219 10*3/uL (ref 150–400)
RBC: 2.79 MIL/uL — ABNORMAL LOW (ref 3.87–5.11)
RDW: 13.2 % (ref 11.5–15.5)
WBC: 6.1 10*3/uL (ref 4.0–10.5)
nRBC: 0 % (ref 0.0–0.2)

## 2019-09-07 LAB — TROPONIN I (HIGH SENSITIVITY)
Troponin I (High Sensitivity): 19 ng/L — ABNORMAL HIGH (ref <18)
Troponin I (High Sensitivity): 21 ng/L — ABNORMAL HIGH (ref <18)

## 2019-09-07 NOTE — ED Provider Notes (Signed)
[facility] Emergency Department Provider Note  Time seen: 10:23 PM  I have reviewed the triage vital signs and the nursing notes.   HISTORY  Chief Complaint Chest Pain and Shortness of Breath   HPI Joanne Michael is a 86 y.o. female with a past medical history of atrial fibrillation, CHF, dementia, gastric reflux, hypertension, hyperlipidemia presents to the emergency department for right-sided chest pain.  According to the patient she has been experiencing right-sided chest discomfort for the past 2 days.  Patient does have mild dementia.  Daughter states she has been complaining about this for longer.  No shortness of breath at this time but patient states she will get shortness of breath at times with the discomfort.  Denies any abdominal pain.  No vomiting or diarrhea.  Past Medical History:  Diagnosis Date  . Arrhythmia   . Arthritis   . Atrial fibrillation ([place])   . Cancer ([place])    melanoma  . Cat allergies   . CHF (congestive heart failure) ([place])   . Chronic renal failure   . Dementia ([place])   . Fatigue   . GERD (gastroesophageal reflux disease)   . Gout   . H/O Clostridium difficile infection   . Hay fever    as child  . Hiatal hernia   . HLD (hyperlipidemia)   . HTN (hypertension)   . Iron deficiency anemia   . Spinal stenosis     Patient Active Problem List   Diagnosis Date Noted  . DNR (do not resuscitate) 11/14/2017  . Chronic diastolic heart failure ([place]) 09/12/2016  . Atrial fibrillation ([place]) 09/12/2016  . CKD (chronic kidney disease), stage III 08/23/2016  . Renal artery stenosis ([place]) 05/25/2016  . Iron deficiency anemia 11/21/2015  . Gout, joint 10/10/2015  . Pure hypercholesterolemia 10/10/2015  . Chronic hip pain (Bilateral) 07/13/2015  . Posterior arthritis of hip (Bilateral) 07/13/2015  . Chronic sacroiliac joint pain (Bilateral) 07/13/2015  . Osteoarthritis of sacroiliac joint (Bilateral) 07/13/2015  . Lumbar spinal  stenosis 07/13/2015  . Spinal stenosis 04/18/2015  . Osteoporosis, post-menopausal 04/18/2015  . Arthritis, degenerative 04/18/2015  . Combined fat and carbohydrate induced hyperlipemia 04/18/2015  . Anemia, iron deficiency 04/18/2015  . Arthritis urica 04/18/2015  . Gastro-esophageal reflux disease without esophagitis 04/18/2015  . CKD (chronic kidney disease) stage 4, GFR 15-29 ml/min (HCC) 04/18/2015  . Chronic low back pain (Location of Primary Source of Pain) (Bilateral) (R>L) 04/18/2015  . Lumbar facet syndrome (Location of Primary Source of Pain) (Bilateral) (R>L) 04/18/2015  . Lumbar spondylosis 04/18/2015  . Chronic pain 04/18/2015  . Long term current use of opiate analgesic 04/18/2015  . Long term prescription opiate use 04/18/2015  . Opiate use 04/18/2015  . Encounter for therapeutic drug level monitoring 04/18/2015  . Encounter for chronic pain management 04/18/2015  . Chronic lower extremity pain (referred) (Location of Secondary source of pain) (Bilateral) (R>L) 04/18/2015  . Neurogenic pain 04/18/2015  . Heart murmur, systolic 06/00/4599  . Pulmonary parenchymal mass 12/02/2013  . Lung mass 12/02/2013  . Cough 11/03/2013  . Chronic renal insufficiency 07/22/2013  . Aortic valve stenosis 07/22/2013  . Hyperlipidemia 05/02/2009  . ESSENTIAL HYPERTENSION, BENIGN 05/02/2009  . CHEST PAIN UNSPECIFIED 05/02/2009    Past Surgical History:  Procedure Laterality Date  . APPENDECTOMY    . BACK SURGERY    . CATARACT EXTRACTION    . CHOLECYSTECTOMY    . feet surgery    . HERNIA REPAIR    .  knee replacement- both    . MELANOMA EXCISION    . NOSE SURGERY    . RENAL ANGIOGRAPHY N/A 05/07/2016   Procedure: Renal Angiography;  Surgeon: Algernon Huxley, MD;  Location: [place] [facility];  Service: Cardiovascular;  Laterality: N/A;  . ROTATOR CUFF REPAIR     left  . SHOULDER SURGERY    . TONSILLECTOMY    . TOTAL ABDOMINAL HYSTERECTOMY      Prior to Admission medications    Medication Sig Start Date End Date Taking? Authorizing Provider  albuterol (PROVENTIL HFA;VENTOLIN HFA) 108 (90 BASE) MCG/ACT inhaler Inhale 1-2 puffs into the lungs every 6 (six) hours as needed for wheezing or shortness of breath. Patient not taking: Reported on 04/14/2019 02/14/15   Minna Merritts, MD  allopurinol (ZYLOPRIM) 100 MG tablet Take 100 mg by mouth 2 (two) times daily.    [provider]  amiodarone (PACERONE) 200 MG tablet TAKE ONE TABLET BY MOUTH EVERY DAY 07/02/19   Minna Merritts, MD  Ascorbic Acid (VITAMIN C) 1000 MG tablet Take 1,000 mg by mouth daily.    [provider]  atorvastatin (LIPITOR) 20 MG tablet Take 20 mg by mouth at bedtime.     [provider]  Calcium Carbonate-Vitamin D (CALTRATE 600+D) 600-400 MG-UNIT per tablet Take 1 tablet by mouth daily.      [provider]  cetirizine (ZYRTEC) 10 MG chewable tablet Chew 10 mg by mouth daily.      [provider]  cloNIDine (CATAPRES) 0.1 MG tablet Take 1 tablet (0.1 mg total) by mouth 2 (two) times daily. 08/30/16   Epifanio Lesches, MD  collagenase (SANTYL) ointment Apply 1 application topically daily.    [provider]  ELIQUIS 2.5 MG TABS tablet TAKE 1 TABLET BY MOUTH TWICE DAILY 07/23/19   Minna Merritts, MD  enalapril (VASOTEC) 2.5 MG tablet Take 2.5 mg by mouth daily.    [provider]  escitalopram (LEXAPRO) 5 MG tablet Take 5 mg by mouth daily.    [provider]  ferrous sulfate 325 (65 FE) MG tablet Take 325 mg by mouth daily with breakfast.    [provider]  fluticasone (FLONASE) 50 MCG/ACT nasal spray Place 2 sprays into both nostrils at bedtime as needed for allergies or rhinitis. 05/25/15   Hillary Bow, MD  furosemide (LASIX) 20 MG tablet TAKE ONE TABLET EVERY DAY AS NEEDED 01/26/19   Minna Merritts, MD  guaiFENesin (MUCINEX) 600 MG 12 hr tablet Take by mouth 2 (two) times daily.    [provider]   hydrALAZINE (APRESOLINE) 25 MG tablet Take 3 tablets (75 mg total) by mouth every 8 (eight) hours. 08/30/16   Epifanio Lesches, MD  isosorbide mononitrate (IMDUR) 30 MG 24 hr tablet Take 1 tablet (30 mg total) by mouth daily. 08/31/16   Epifanio Lesches, MD  Multiple Vitamin (MULTIVITAMIN) tablet Take 1 tablet by mouth daily.    [provider]  Multiple Vitamins-Minerals (PRESERVISION AREDS 2 PO) Take 1 capsule by mouth daily.    [provider]  pantoprazole (PROTONIX) 40 MG tablet Take 40 mg by mouth at bedtime.  02/14/15   Minna Merritts, MD  Probiotic Product ([place]) Take 1 capsule by mouth daily.    [provider]    Allergies  Allergen Reactions  . Oxycodone Itching  . Codeine Nausea And Vomiting and Itching  . Hydrocodone-Acetaminophen Other (See Comments)    Hallucinates  .  Iodine Swelling, Other (See Comments), Itching and Nausea Only    Patient is allergic to seafood and this is the cause why she can't take medication.  . Meperidine Hcl     Unknown reactions.  . Other Swelling    Seafood- tongue swelling and discoloration Patient allergic to seafood, tongue swelling and discoloration.  . Propoxyphene N-Acetaminophen Other (See Comments)    Unknown reaction    Family History  Problem Relation Age of Onset  . Ovarian cancer Mother   . Liver cancer Father   . Heart failure Father   . Lung cancer Brother   . Prostate cancer Brother   . Lung cancer Sister     Social History Social History   Tobacco Use  . Smoking status: Never Smoker  . Smokeless tobacco: Never Used  . Tobacco comment: Tobacco use- no   Vaping Use  . Vaping Use: Never used  Substance Use Topics  . Alcohol use: Yes    Alcohol/week: 3.0 standard drinks    Types: 3 Glasses of wine per week  . Drug use: No    Review of Systems per patient and daughter Constitutional: Negative for fever. Cardiovascular: Right lower chest wall/chest  pain Respiratory: Negative for shortness of breath. Gastrointestinal: Negative for abdominal pain, vomiting  Musculoskeletal: Negative for musculoskeletal complaints Neurological: Negative for headache All other ROS negative  ____________________________________________   PHYSICAL EXAM:  VITAL SIGNS: ED Triage Vitals  Enc Vitals Group     BP 09/07/19 1846 (!) 182/36     Pulse Rate 09/07/19 1846 (!) 52     Resp 09/07/19 1846 16     Temp 09/07/19 1846 98.1 F (36.7 C)     Temp Source 09/07/19 1846 Oral     SpO2 09/07/19 1846 100 %     Weight 09/07/19 1849 103 lb (46.7 kg)     Height 09/07/19 1849 4\' 11"  (1.499 m)     Head Circumference --      Peak Flow --      Pain Score 09/07/19 1905 7     Pain Loc --      Pain Edu? --      Excl. in [place]? --    Constitutional: Alert and oriented. Well appearing and in no distress. Eyes: Normal exam ENT      Head: Normocephalic and atraumatic.      Mouth/Throat: Mucous membranes are moist. Cardiovascular: Normal rate, regular rhythm. Respiratory: Normal respiratory effort without tachypnea nor retractions. Breath sounds are clear.  Moderate right lower anterior chest wall tenderness to palpation. Gastrointestinal: Soft and nontender. No distention. Musculoskeletal: Nontender with normal range of motion in all extremities.  Neurologic:  Normal speech and language. No gross focal neurologic deficits  Skin:  Skin is warm, dry and intact.  Psychiatric: Mood and affect are normal.   ____________________________________________    EKG  EKG viewed and interpreted by myself shows sinus bradycardia 52 bpm with a narrow QRS, normal axis, normal intervals, no concerning ST changes.  ____________________________________________    RADIOLOGY  Chest x-ray shows possible right basilar pulmonary infiltrate.  CT shows COPD, chronic changes.  ____________________________________________   INITIAL IMPRESSION / ASSESSMENT AND PLAN / ED  COURSE  Pertinent labs & imaging results that were available during my care of the patient were reviewed by me and considered in my medical decision making (see chart for details).   Patient presents emergency department for several days of right lower chest pain.  No significant cough.  Does  state shortness of breath at times with the pain.  Overall the patient appears well, daughter states she has been complaining of right lower chest pain for longer than the past several days but seem more bothered by recently.  X-ray shows possible infiltrate in the right lower lung.  Lab work largely at the patient's baseline including CKD.  Troponin of 19 we will repeat after several hours.  We will obtain a CT scan without contrast given renal insufficiency to further evaluate the possible infiltrate.  CT shows COPD and chronic changes no significant acute abnormality noted.  Specifically no findings to explain the patient's right lower chest pain.  We will discharge the patient home with PCP follow-up.  Patient daughter agreeable to plan of care.  RENELLA STEIG was evaluated in Emergency Department on 09/07/2019 for the symptoms described in the history of present illness. She was evaluated in the context of the global COVID-19 pandemic, which necessitated consideration that the patient might be at risk for infection with the SARS-CoV-2 virus that causes COVID-19. Institutional protocols and algorithms that pertain to the evaluation of patients at risk for COVID-19 are in a state of rapid change based on information released by regulatory bodies including the CDC and federal and state organizations. These policies and algorithms were followed during the patient's care in the ED.  ____________________________________________   FINAL CLINICAL IMPRESSION(S) / ED DIAGNOSES  Chest pain   Harvest Dark, MD 09/07/19 2311

## 2019-09-07 NOTE — ED Notes (Signed)
EDP Paduchowski at bedside.  

## 2019-09-07 NOTE — ED Triage Notes (Signed)
First NUrse Note:  Arrives from home via [facility] with c/o chest pressure and SOB today.  Lives at home with daughter.  VS wnl.

## 2019-09-07 NOTE — ED Triage Notes (Signed)
Reports chest pressure and SOB intermittenlty for the last few days. "my ribs hurts so bad when breathing". Lives at home with daughter who helps pt report symptoms. Pt with hx of dementia. Pt in NAD, RR even and unlabored, color WNL.

## 2019-09-07 NOTE — ED Notes (Addendum)
2nd trop sent to lab. Pt has had 2 recent falls for family. Pt points to left side flank/rib area when asked about pain. Pt sitting calmly in bed. Denies CP. Resp reg/unlabored currently but pt reports sometimes SOB. Visitor at bedside.

## 2019-09-08 ENCOUNTER — Ambulatory Visit (INDEPENDENT_AMBULATORY_CARE_PROVIDER_SITE_OTHER): Payer: Medicare Other

## 2019-09-08 DIAGNOSIS — I350 Nonrheumatic aortic (valve) stenosis: Secondary | ICD-10-CM

## 2019-09-09 ENCOUNTER — Telehealth: Payer: Self-pay | Admitting: Nurse Practitioner

## 2019-09-09 NOTE — Telephone Encounter (Signed)
Called patient's home number to schedule Palliative Consult, no answer - left message with reason for call along with my name and contact number.

## 2019-09-09 NOTE — Telephone Encounter (Signed)
Rec'd return call from daughter, Pablo Ledger, and after discussing Palliative services with her and all questions were answered she was in agreement with this.  I have scheduled an In-person Consult for 09/18/19 @ 8:30 AM.

## 2019-09-11 ENCOUNTER — Telehealth: Payer: Self-pay | Admitting: Nurse Practitioner

## 2019-09-11 NOTE — Telephone Encounter (Signed)
At the request of the Palliative NP I called the patient's daughter, [name], to see if we could reschedule the Palliative Consult, due to a conflict with another appointment.  Spoke with daughter and I have rescheduled the Consult for 09/24/19 @ 11:15 AM.  (Daughter requested an appointment a little later than 8:30 AM, as previously scheduled).

## 2019-09-16 ENCOUNTER — Telehealth: Payer: Self-pay | Admitting: *Deleted

## 2019-09-16 NOTE — Telephone Encounter (Signed)
-----   Message from Minna Merritts, MD sent at 09/14/2019  8:26 AM EDT ----- Echocardiogram Left ventricle, ejection fraction is normal Right heart function normal There is valvular disease moderate regurgitation of the mitral valve Moderate aortic valve stenosis Tricuspid valve also has significant regurgitation Would stay on the Lasix For any worsening abdominal bloating leg swelling, worsening shortness of breath, take extra Lasix

## 2019-09-16 NOTE — Telephone Encounter (Signed)
Left voicemail message to call back  

## 2019-09-18 ENCOUNTER — Other Ambulatory Visit: Payer: Self-pay | Admitting: Nurse Practitioner

## 2019-09-18 NOTE — Telephone Encounter (Signed)
Attempted to call the patient at her home #.  No answer- I left a message to please call back.  Attempted to call the cell #.  No answer- I did leave a message of results on the identified voice mail for her daughter Joanne Michael, of results (ok per DPR). I asked that she call back with any questions.

## 2019-09-24 ENCOUNTER — Other Ambulatory Visit: Payer: Self-pay

## 2019-09-24 ENCOUNTER — Telehealth: Payer: Self-pay | Admitting: Nurse Practitioner

## 2019-09-24 ENCOUNTER — Other Ambulatory Visit: Payer: Medicare Other | Admitting: Nurse Practitioner

## 2019-09-24 ENCOUNTER — Encounter: Payer: Self-pay | Admitting: Nurse Practitioner

## 2019-09-24 DIAGNOSIS — Z515 Encounter for palliative care: Secondary | ICD-10-CM

## 2019-09-24 DIAGNOSIS — N184 Chronic kidney disease, stage 4 (severe): Secondary | ICD-10-CM

## 2019-09-24 NOTE — Progress Notes (Signed)
[facility] Consult Note Telephone: [phone]  Fax: [phone]  PATIENT NAME: Joanne Michael DOB: Dec 03, 1924 MRN: [id number]  PRIMARY CARE PROVIDER:   Dion Body, MD  REFERRING PROVIDER:  Dion Body, MD [facility] [place],  [place] [address]   I was asked by Dr Netty Starring to see Joanne Michael for Palliative consult for goals of care  RESPONSIBLE PARTY:   Daughter Pablo Ledger [phone] RECOMMENDATIONS and PLAN:  1. ACP: DNR, [place] completed placed in [place], will have Hospice Physicians review case. Continue to treat what is treatable, minimize  sufferring  GFR 16; Weight loss of 10lbs (9%) with current weight 103.0lbs over last month with appetite decreasing to only taking in supplemental   2. Palliative care encounter; Palliative medicine team will continue to support patient, patient's family, and medical team. Visit consisted of counseling and education dealing with the complex and emotionally intense issues of symptom management and palliative care in the setting of serious and potentially life-threatening illness  I spent 90 minutes providing this consultation,  from 11:00am to 12:30pm. More than 50% of the time in this consultation was spent coordinating communication.   I called Joanne Pulse, Joanne Youngberg daughter to confirm face-to-face palliative care initial visit for today. Joanne Michael endorses were ready for visit. We talked about briefly past medical history, chronic disease progression. Joanne Michael talked about her father passing years ago and since Joanne Michael has resided with Joanne Michael. We talked about her functional level. She does require help with dressing in bathing though it is very difficult to get Joanne Michael in the bathtub. Joanne Aho wishes not to get in the shower either. Joanne Coco is ambulatory and does have falls. Joanne Michael endorses they have found her on the floor. We  talked about cognitive changes which are worsening. Joanne Michael endorses there are times where she is very irritable and difficult to redirect. We talked about Dr. Candiss Norse nephrologist with chronic kidney disease. We talked about using medications cautiously. We talked about sleep. Joanne Michael and door says Joanne Michael is not sleeping, Joanne. Michael is up and down through the night. The other night Joanne Michael awoke to find her standing at the foot of her bed. Joanne Michael endorse Joanne. Michael  becomes very moody and irritable at times difficult to redirect. We talked about the possibility of Seroquel but will review with Dr. Candiss Norse as the benefits may outweigh the risks. We also talked about option of low dose melatonin during the day with severe irritability. We talked about role of palliative care and plan of care. We talked about Dr. Candiss Norse mentioning Hospice. Joanne Michael and I talked about Hospice benefit under Medicare program. We talked about what Hospice Services would be provided. We talked about eligibility and can further discuss hospice at the time of the visit. Joanne Michael an agreement. Questions answered. Therapeutic listening and emotional support provided.  HISTORY OF PRESENT ILLNESS:  [name] is a 86 y.o. year old female with multiple medical problems including Atrial fibrillation, congestive heart failure, parenchymal mass, renal artery stenosis, aortic valve stenosis, chronic renal disease, dementia, spinal stenosis, hypertension, hyperlipidemia, hiatal hernia, gout, osteoporosis, GERD, melanoma, arthritis oh, total abdominal hysterectomy, tonsillectomy, left rotator cuff repair, nose surgery, melanoma excision, hernia repair, colectomy, cataract extraction, back surgery, appendectomy.  Nephrology visit 7/8 / 2021 for burning with urination recent antibiotic use recommended to continue boost, hold hydralazine and possible hospice. Visit to the emergency department from 7 / 12 /  2021 to 7 / 13 / 2021 for shortness of breath  right sided chest pain dementia mild. CT scan shows COPD. Initial palliative care visit with daughter Joanne Michael and Joanne Pigue face to face. I visited and observed Joanne. Blane. We talked about purpose of palliative care visit. We talked about how Joanne Michael has been feeling. Joanne Michael was cooperative with assessment. Joanne. Michael was able to answer simple questions so became confused on some events with description. Joanne. Michael talked a lot about playing basketball as a child. She talked about having some back pain as she is slouching on the couch from playing basketball while young. We talked about fatigue, weakness. Joanne Michael endorses that she does sleep at night though has vivid dreams. Joanne Michael endorses that she is up through the night. There are times when she will yell out Kathy's name out. The other night Joanne Michael awoke with her standing by her bed. We talked about the option of trying melatonin at bedtime low-dose 3 mg. We talked about appetite. Joanne Michael endorses that she is not sure about foods she likes. Joanne. Michael endorses she does drink boost a lot and eat yogurt. We talked about her weight loss. Joanne Michael endorses she does not want to get fat. We talked about weight loss as one ages and the importance of nutrition. Joanne Michael talked at length about [name] her Nephrologist, chronic kidney disease with progression. We talked about natural aging changes. Joanne Michael was ruminating over playing basketball as a child. Joanne Michael was concerned about placement. Joanne. Michael endorses her husband passed and she was used to him doing things for her. After Joanne. Michael husband passed she moved in with her daughter Joanne Michael. Joanne. Michael talked about her husband asking family not to put this making in in a nursing home. Joanne Michael talked about her fears going to a nursing home. Joanne. Michael talked about her sisters and mother having to be in a nursing home so she is not afraid of it but she wants to be at  home with family. We talked about Joanne Michael her daughter keeping her at home as long as possible which is the goal. We talked about help at home to give Joanne Michael support and tools to take care of Joanne Stolze. Joanne Franze was open to that idea. Limited discussion as Joanne Ferch with cognitive impairment was difficult to keep on track with conversation. Joanne Kerins endorse is that her mind overthinks things sometimes or she becomes irritable. We talked about medications including Seroquel but will review with Dr. Candiss Norse Nephrology. We also reviewed melatonin at that time load a stream lmg but also half a tablet which would be 1.5 mg or a 1 mg tablet if Joanne Bensen is very agitated. We talked about the mechanism of melatonin. We talked about sleep hygiene. We talked about dementia, chronic disease with progression and realistic expectations. We talked about behaviors. Joanne. Ostenson endorses she does have a tremor. Joanne Dobrowolski endorses that she is tired. We talked about recent visit to the emergency department. Joanne Michael endorses she was concerned with her chest pain no specific reason found and she was discharged home. We talked about age, comorbidities, heart murmur, fatigue, dehydration. We talked about medical goals of care. We talked about DNR with wishes for DNR. [place]rod form completed and will play something, leave a copy in the home. We talked about hospice benefit under Medicare program. We talked about what would be provided. We talked about reviewing case with  Hospice Physicians for eligibility. Joanne Michael and Joanne Racey both in agreement. Discussed if she is Hospice eligible, Dr. Osa Craver does not remain attending and will need to switch to another primary provider. If available Lauretta Grill NP come into the home for Primary Care. Joanne Michael in agreement. We talked about specialty appointments including Dr. Candiss Norse Nephrology and Dr. Sander Radon Cardiology. We talked about visit, phone consultation should an episode or concern  comes up. We talked about minimizing specialty appointments. Discussed will recontact Joanne Michael once Hospice Physicians review case. If eligible will call Dr. Osa Craver with update and contact Lauretta Grill Nurse Practitioner. If wishes already continued palliative care will schedule follow-up visit. Joanne Michael in agreement. Therapeutic listening, emotional support provided. Questions answered to satisfaction. Joanne Aldape expressed she was glad for palliative visit  Palliative Care was asked to help to continue to address goals of care.   CODE STATUS: DNR  PPS: 40% HOSPICE ELIGIBILITY/DIAGNOSIS: TBD  PAST MEDICAL HISTORY:  Past Medical History:  Diagnosis Date  . Arrhythmia   . Arthritis   . Atrial fibrillation ([place])   . Cancer ([place])    melanoma  . Cat allergies   . CHF (congestive heart failure) ([place])   . Chronic renal failure   . Dementia ([place])   . Fatigue   . GERD (gastroesophageal reflux disease)   . Gout   . H/O Clostridium difficile infection   . Hay fever    as child  . Hiatal hernia   . HLD (hyperlipidemia)   . HTN (hypertension)   . Iron deficiency anemia   . Spinal stenosis     SOCIAL HX:  Social History   Tobacco Use  . Smoking status: Never Smoker  . Smokeless tobacco: Never Used  . Tobacco comment: Tobacco use- no   Substance Use Topics  . Alcohol use: Yes    Alcohol/week: 3.0 standard drinks    Types: 3 Glasses of wine per week    ALLERGIES:  Allergies  Allergen Reactions  . Oxycodone Itching  . Codeine Nausea And Vomiting and Itching  . Hydrocodone-Acetaminophen Other (See Comments)    Hallucinates  . Iodine Swelling, Other (See Comments), Itching and Nausea Only    Patient is allergic to seafood and this is the cause why she can't take medication.  . Meperidine Hcl     Unknown reactions.  . Other Swelling    Seafood- tongue swelling and discoloration Patient allergic to seafood, tongue swelling and discoloration.  . Propoxyphene N-Acetaminophen  Other (See Comments)    Unknown reaction     PERTINENT MEDICATIONS:  Outpatient Encounter Medications as of 09/24/2019  Medication Sig  . albuterol (PROVENTIL HFA;VENTOLIN HFA) 108 (90 BASE) MCG/ACT inhaler Inhale 1-2 puffs into the lungs every 6 (six) hours as needed for wheezing or shortness of breath. (Patient not taking: Reported on 04/14/2019)  . allopurinol (ZYLOPRIM) 100 MG tablet Take 100 mg by mouth 2 (two) times daily.  Marland Kitchen amiodarone (PACERONE) 200 MG tablet TAKE ONE TABLET BY MOUTH EVERY DAY  . Ascorbic Acid (VITAMIN C) 1000 MG tablet Take 1,000 mg by mouth daily.  Marland Kitchen atorvastatin (LIPITOR) 20 MG tablet Take 20 mg by mouth at bedtime.   . Calcium Carbonate-Vitamin D (CALTRATE 600+D) 600-400 MG-UNIT per tablet Take 1 tablet by mouth daily.    . cetirizine (ZYRTEC) 10 MG chewable tablet Chew 10 mg by mouth daily.    . cloNIDine (CATAPRES) 0.1 MG tablet Take 1 tablet (0.1 mg total) by mouth 2 (two) times daily.  Marland Kitchen  collagenase (SANTYL) ointment Apply 1 application topically daily.  Marland Kitchen ELIQUIS 2.5 MG TABS tablet TAKE 1 TABLET BY MOUTH TWICE DAILY  . enalapril (VASOTEC) 2.5 MG tablet Take 2.5 mg by mouth daily.  Marland Kitchen escitalopram (LEXAPRO) 5 MG tablet Take 5 mg by mouth daily.  . ferrous sulfate 325 (65 FE) MG tablet Take 325 mg by mouth daily with breakfast.  . fluticasone (FLONASE) 50 MCG/ACT nasal spray Place 2 sprays into both nostrils at bedtime as needed for allergies or rhinitis.  . furosemide (LASIX) 20 MG tablet TAKE ONE TABLET EVERY DAY AS NEEDED  . guaiFENesin (MUCINEX) 600 MG 12 hr tablet Take by mouth 2 (two) times daily.  . hydrALAZINE (APRESOLINE) 25 MG tablet Take 3 tablets (75 mg total) by mouth every 8 (eight) hours.  . isosorbide mononitrate (IMDUR) 30 MG 24 hr tablet Take 1 tablet (30 mg total) by mouth daily.  . Multiple Vitamin (MULTIVITAMIN) tablet Take 1 tablet by mouth daily.  . Multiple Vitamins-Minerals (PRESERVISION AREDS 2 PO) Take 1 capsule by mouth daily.  .  pantoprazole (PROTONIX) 40 MG tablet Take 40 mg by mouth at bedtime.   . Probiotic Product (PHILLIPS COLON HEALTH PO) Take 1 capsule by mouth daily.   No facility-administered encounter medications on file as of 09/24/2019.    PHYSICAL EXAM:   General: NAD, frail appearing, thin, pleasantly forgetful female Cardiovascular: regular rate and rhythm Pulmonary: clear ant fields Extremities: muscle wasting;  Skin: ecchymosis to left lower leg Neurological: ambulatory  Isamar Wellbrock Ihor Gully, NP

## 2019-09-24 NOTE — Telephone Encounter (Signed)
I received notification that Hospice Physician review deemed Hospice eligible. I contacted Joanne Michael daughter, updated and will proceed with Hospice order. I called Dr Netty Starring office, message left for request for Hospice order. I called Dr Claretta Fraise office, message left concerning Hospice services and to see if in agreement with start seroquel.

## 2019-09-24 NOTE — Telephone Encounter (Signed)
Spoke with patients daughter per release form and reviewed results and recommendations. She verbalized understanding of our conversation and had no further questions at this time.

## 2019-10-27 ENCOUNTER — Other Ambulatory Visit: Payer: Self-pay | Admitting: Cardiovascular Disease

## 2019-10-27 NOTE — Telephone Encounter (Signed)
Pt last saw Dr Rockey Situ 04/14/19, last labs 09/07/19 Creat 2.50, age 86, weight 46.7kg, based on specified criteria pt is on appropriate dosage of Eliquis 2.5mg  BID.  Will refill rx.

## 2019-12-30 ENCOUNTER — Ambulatory Visit (INDEPENDENT_AMBULATORY_CARE_PROVIDER_SITE_OTHER): Payer: Medicare Other | Admitting: Dermatology

## 2019-12-30 ENCOUNTER — Other Ambulatory Visit: Payer: Self-pay

## 2019-12-30 DIAGNOSIS — L821 Other seborrheic keratosis: Secondary | ICD-10-CM

## 2019-12-30 DIAGNOSIS — L578 Other skin changes due to chronic exposure to nonionizing radiation: Secondary | ICD-10-CM | POA: Diagnosis not present

## 2019-12-30 DIAGNOSIS — D0439 Carcinoma in situ of skin of other parts of face: Secondary | ICD-10-CM | POA: Diagnosis not present

## 2019-12-30 DIAGNOSIS — D492 Neoplasm of unspecified behavior of bone, soft tissue, and skin: Secondary | ICD-10-CM

## 2019-12-30 NOTE — Patient Instructions (Signed)

## 2019-12-30 NOTE — Progress Notes (Signed)
   Follow-Up Visit   Subjective  Joanne Michael is a 86 y.o. female who presents for the following: lesion (Right side of face. Was a scaly patch for a long time. Recently changing and growing. ).  Changed over past month.  She has a history of melanoma.    The following portions of the chart were reviewed this encounter and updated as appropriate:     Review of Systems: No other skin or systemic complaints except as noted in HPI or Assessment and Plan.  Objective  Well appearing patient in no apparent distress; mood and affect are within normal limits.  A focused examination was performed including head, including the scalp, face, neck, nose, ears, eyelids, and lips. Relevant physical exam findings are noted in the Assessment and Plan.  Objective  Right Preauricular Area: ISK, R/O SCC 1cm keratotic crusted nodule with bleeding  Assessment & Plan  Neoplasm of skin Right Preauricular Area  Epidermal / dermal shaving  Lesion diameter (cm):  1 Informed consent: discussed and consent obtained   Patient was prepped and draped in usual sterile fashion: Area prepped with alcohol. Anesthesia: the lesion was anesthetized in a standard fashion   Anesthetic:  1% lidocaine w/ epinephrine 1-100,000 buffered w/ 8.4% NaHCO3 Instrument used: flexible razor blade   Hemostasis achieved with: pressure, aluminum chloride and electrodesiccation   Outcome: patient tolerated procedure well   Post-procedure details: wound care instructions given   Post-procedure details comment:  Ointment and small bandage applied Additional details:  1.6 x 1.1cm p tx defect Shave down to fat layer  Specimen 1 - Surgical pathology Differential Diagnosis: ISK, R/O SCC Check Margins: Yes 1cm keratotic crusted nodule with bleeding  Actinic Damage - chronic, secondary to cumulative UV radiation exposure/sun exposure over time - diffuse scaly erythematous macules with underlying dyspigmentation - Recommend  daily broad spectrum sunscreen SPF 30+ to sun-exposed areas, reapply every 2 hours as needed.  - Call for new or changing lesions.  Seborrheic Keratoses - Stuck-on, waxy, tan-brown papules and plaques  - Discussed benign etiology and prognosis. - Observe - Call for any changes   Return if symptoms worsen or fail to improve.  Pending pathology results  I, Emelia Salisbury, CMA, am acting as scribe for Brendolyn Patty, MD.  Documentation: I have reviewed the above documentation for accuracy and completeness, and I agree with the above.  Brendolyn Patty MD

## 2020-01-04 ENCOUNTER — Telehealth: Payer: Self-pay

## 2020-01-04 NOTE — Telephone Encounter (Signed)
Advised patient's daughter, [name], of biopsy results. Follow-up scheduled for 04/05/2019 at 10:15am.

## 2020-01-04 NOTE — Telephone Encounter (Signed)
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  Recheck in 3 months.

## 2020-01-06 ENCOUNTER — Encounter: Payer: Self-pay | Admitting: Dermatology

## 2020-01-16 ENCOUNTER — Other Ambulatory Visit: Payer: Self-pay | Admitting: Cardiovascular Disease

## 2020-01-28 ENCOUNTER — Other Ambulatory Visit: Payer: Self-pay | Admitting: Cardiovascular Disease

## 2020-01-28 NOTE — Telephone Encounter (Signed)
Rx request sent to pharmacy.  

## 2020-04-04 ENCOUNTER — Ambulatory Visit: Payer: Medicare Other | Admitting: Dermatology

## 2020-04-26 DEATH — deceased
